# Patient Record
Sex: Male | Born: 1940 | Race: White | Hispanic: No | Marital: Married | Smoking: Never smoker
Health system: Southern US, Community
[De-identification: ages and names within clinical notes are randomized; demographics above are authoritative.]

## PROBLEM LIST (undated history)

## (undated) DIAGNOSIS — F0390 Unspecified dementia without behavioral disturbance: Secondary | ICD-10-CM

## (undated) DIAGNOSIS — I10 Essential (primary) hypertension: Secondary | ICD-10-CM

## (undated) DIAGNOSIS — M109 Gout, unspecified: Secondary | ICD-10-CM

## (undated) DIAGNOSIS — K263 Acute duodenal ulcer without hemorrhage or perforation: Secondary | ICD-10-CM

## (undated) DIAGNOSIS — N289 Disorder of kidney and ureter, unspecified: Secondary | ICD-10-CM

## (undated) DIAGNOSIS — N183 Chronic kidney disease, stage 3 unspecified: Secondary | ICD-10-CM

## (undated) DIAGNOSIS — K5730 Diverticulosis of large intestine without perforation or abscess without bleeding: Secondary | ICD-10-CM

## (undated) DIAGNOSIS — H269 Unspecified cataract: Secondary | ICD-10-CM

## (undated) DIAGNOSIS — E785 Hyperlipidemia, unspecified: Secondary | ICD-10-CM

## (undated) DIAGNOSIS — L98499 Non-pressure chronic ulcer of skin of other sites with unspecified severity: Secondary | ICD-10-CM

## (undated) HISTORY — DX: Gout, unspecified: M10.9

## (undated) HISTORY — DX: Acute duodenal ulcer without hemorrhage or perforation: K26.3

## (undated) HISTORY — PX: CHOLECYSTECTOMY: SHX55

## (undated) HISTORY — PX: JOINT REPLACEMENT: SHX530

---

## 2000-12-17 ENCOUNTER — Ambulatory Visit: Admit: 2000-12-17 | Disposition: A | Discharge status: Home / Self Care | Payer: Self-pay | Admitting: Gastroenterology

## 2006-05-17 ENCOUNTER — Emergency Department
Admission: EM | Admit: 2006-05-17 | Disposition: A | Discharge status: Home / Self Care | Payer: Self-pay | Source: Emergency Department | Admitting: Emergency Medicine

## 2006-05-18 LAB — ^CBC WITH DIFF MCKESSON
BASOPHILS %: 0.4 % (ref 0–2)
Baso(Absolute): 0
Eosinophils %: 1.5 % (ref 0–6)
Eosinophils Absolute: 0.1
Hematocrit: 49.4 % — ABNORMAL HIGH (ref 39.0–49.0)
Hemoglobin: 16.4 g/dL (ref 13.2–17.3)
Lymphocytes Absolute: 1.2
Lymphocytes Relative: 17.9 % — ABNORMAL LOW (ref 25–55)
MCH: 29.3 pg (ref 27.0–34.0)
MCHC: 33.2 % (ref 32.0–36.0)
MCV: 88.2 fL (ref 80–100)
Monocytes Absolute: 0.6
Monocytes Relative %: 8.5 % — ABNORMAL HIGH (ref 1–8)
Neutrophils Absolute: 4.7
Neutrophils Relative %: 71.7 % — ABNORMAL HIGH (ref 49–69)
Platelets: 238 10*3/uL (ref 150–400)
RBC: 5.6 /mm3 — ABNORMAL HIGH (ref 3.80–5.40)
RDW: 13.4 % (ref 11.0–14.0)
WBC: 6.5 10*3/uL (ref 4.8–10.8)

## 2006-05-18 LAB — COMPREHENSIVE METABOLIC PANEL
ALT: 20 U/L (ref 7–56)
AST (SGOT): 29 U/L (ref 5–40)
Albumin, Synovial: 4.5 g/dL (ref 3.9–5.0)
Alkaline Phosphatase: 75 U/L (ref 38–126)
BUN / Creatinine Ratio: 17 (ref 8–20)
BUN: 22 mg/dL — ABNORMAL HIGH (ref 6–20)
Bilirubin, Total: 0.8 mg/dL (ref 0.2–1.3)
CO2: 27 mmol/L (ref 21.0–31.0)
Calcium: 9.1 mg/dL (ref 8.4–10.2)
Chloride: 104 mmol/L (ref 101–111)
Creatinine: 1.33 mg/dL (ref 0.5–1.4)
EGFR: 58 mL/min/{1.73_m2}
EGFR: >60 mL/min/{1.73_m2}
Glucose: 100 mg/dL (ref 70–100)
Potassium: 4.1 mmol/L (ref 3.6–5.0)
Protein, Total: 7.7 g/dL (ref 6.3–8.2)
Sodium: 142 mmol/L (ref 135–145)

## 2006-05-18 LAB — URINALYSIS
Bilirubin, UA: NEGATIVE
Blood, UA: NEGATIVE
Glucose, UA: NEGATIVE
Ketones UA: NEGATIVE
Leukocyte Esterase, UA: NEGATIVE
Nitrate: NEGATIVE
Protein, UR: NEGATIVE
Specific Gravity, UR: 1.016 (ref 1.000–1.035)
Urobilinogen, UA: NORMAL
pH, Urine: 5.5 (ref 5.0–8.0)

## 2007-01-09 ENCOUNTER — Inpatient Hospital Stay
Admission: EM | Admit: 2007-01-09 | Disposition: A | Discharge status: Home / Self Care | Payer: Self-pay | Source: Emergency Department | Admitting: Internal Medicine

## 2007-01-09 DIAGNOSIS — K922 Gastrointestinal hemorrhage, unspecified: Secondary | ICD-10-CM

## 2007-01-09 HISTORY — DX: Gastrointestinal hemorrhage, unspecified: K92.2

## 2007-01-11 LAB — BASIC METABOLIC PANEL
BUN / Creatinine Ratio: 22 — ABNORMAL HIGH (ref 8–20)
BUN: 33 mg/dL — ABNORMAL HIGH (ref 6–20)
CO2: 25 mmol/L (ref 21.0–31.0)
Calcium: 8.7 mg/dL (ref 8.4–10.2)
Chloride: 108 mmol/L (ref 101–111)
Creatinine: 1.49 mg/dL — ABNORMAL HIGH (ref 0.5–1.4)
EGFR: 51 mL/min/{1.73_m2}
EGFR: >60 mL/min/{1.73_m2}
Glucose: 81 mg/dL (ref 70–100)
Potassium: 4.3 mmol/L (ref 3.6–5.0)
Sodium: 139 mmol/L (ref 135–145)

## 2007-01-11 LAB — ^CBC WITH DIFF MCKESSON
BASOPHILS %: 0.4 % (ref 0–2)
BASOPHILS %: 0.9 % (ref 0–2)
Baso(Absolute): 0
Baso(Absolute): 0.1
Eosinophils %: 0.3 % (ref 0–6)
Eosinophils %: 2.5 % (ref 0–6)
Eosinophils Absolute: 0
Eosinophils Absolute: 0.2
Hematocrit: 33.5 % — ABNORMAL LOW (ref 39.0–49.0)
Hematocrit: 26.9 % — ABNORMAL LOW (ref 39.0–49.0)
Hemoglobin: 11.2 g/dL — ABNORMAL LOW (ref 13.2–17.3)
Hemoglobin: 9.1 g/dL — ABNORMAL LOW (ref 13.2–17.3)
Lymphocytes Absolute: 1.8
Lymphocytes Absolute: 1.9
Lymphocytes Relative: 15.3 % — ABNORMAL LOW (ref 25–55)
Lymphocytes Relative: 23.5 % — ABNORMAL LOW (ref 25–55)
MCH: 29.6 pg (ref 27.0–34.0)
MCH: 29.8 pg (ref 27.0–34.0)
MCHC: 33.4 % (ref 32.0–36.0)
MCHC: 33.7 % (ref 32.0–36.0)
MCV: 88.5 fL (ref 80–100)
MCV: 88.3 fL (ref 80–100)
Monocytes Absolute: 1
Monocytes Absolute: 0.6
Monocytes Relative %: 8.4 % — ABNORMAL HIGH (ref 1–8)
Monocytes Relative %: 7.3 % (ref 1–8)
Neutrophils Absolute: 9.1
Neutrophils Absolute: 5.2
Neutrophils Relative %: 75.6 % — ABNORMAL HIGH (ref 49–69)
Neutrophils Relative %: 65.8 % (ref 49–69)
Platelets: 255 10*3/uL (ref 150–400)
Platelets: 182 10*3/uL (ref 150–400)
RBC: 3.78 /mm3 — ABNORMAL LOW (ref 3.80–5.40)
RBC: 3.05 /mm3 — ABNORMAL LOW (ref 3.80–5.40)
RDW: 13.2 % (ref 11.0–14.0)
RDW: 13.4 % (ref 11.0–14.0)
WBC: 12.1 10*3/uL — ABNORMAL HIGH (ref 4.8–10.8)
WBC: 7.9 10*3/uL (ref 4.8–10.8)

## 2007-01-11 LAB — URINALYSIS
Bilirubin, UA: NEGATIVE
Blood, UA: NEGATIVE
Glucose, UA: NEGATIVE
Leukocyte Esterase, UA: NEGATIVE
Nitrate: NEGATIVE
Specific Gravity, UR: 1.022 (ref 1.000–1.035)
Urobilinogen, UA: NORMAL
pH, Urine: 5.5 (ref 5.0–8.0)

## 2007-01-11 LAB — COMPREHENSIVE METABOLIC PANEL
ALT: 18 U/L (ref 7–56)
AST (SGOT): 30 U/L (ref 5–40)
Albumin, Synovial: 4 g/dL (ref 3.9–5.0)
Alkaline Phosphatase: 62 U/L (ref 38–126)
BUN / Creatinine Ratio: 27 — ABNORMAL HIGH (ref 8–20)
BUN: 47 mg/dL — ABNORMAL HIGH (ref 6–20)
Bilirubin, Total: 0.6 mg/dL (ref 0.2–1.3)
CO2: 25 mmol/L (ref 21.0–31.0)
Calcium: 10.7 mg/dL — ABNORMAL HIGH (ref 8.4–10.2)
Chloride: 104 mmol/L (ref 101–111)
Creatinine: 1.8 mg/dL — ABNORMAL HIGH (ref 0.5–1.4)
EGFR: 41 mL/min/{1.73_m2}
EGFR: 49 mL/min/{1.73_m2}
Glucose: 106 mg/dL — ABNORMAL HIGH (ref 70–100)
Potassium: 4.1 mmol/L (ref 3.6–5.0)
Protein, Total: 6.7 g/dL (ref 6.3–8.2)
Sodium: 140 mmol/L (ref 135–145)

## 2007-01-11 LAB — URINALYSIS WITH MICROSCOPIC

## 2007-01-11 LAB — PTT (PARTIAL THROMBOPLASTIN TIME)
PTT Ratio: 0.8 (ref 0.8–1.2)
PTT: 22.5 s (ref 21.6–34.0)

## 2007-01-11 LAB — PT (PROTHROMBIN TIME)
PT INR: 1.1
PT: 12.5 s (ref 10.6–12.8)

## 2007-01-11 LAB — ABO/RH: ABO Rh: A POS

## 2007-01-11 LAB — ^HH  (HEMOGLOBIN AND HEMATOCRIT) MCKESSON
Hematocrit: 28 % — ABNORMAL LOW (ref 39.0–49.0)
Hematocrit: 26.3 % — ABNORMAL LOW (ref 39.0–49.0)
Hemoglobin: 9.4 g/dL — ABNORMAL LOW (ref 13.2–17.3)
Hemoglobin: 8.8 g/dL — ABNORMAL LOW (ref 13.2–17.3)

## 2007-01-11 LAB — ANTIBODY SCREEN: AB Screen Gel: NEGATIVE

## 2007-03-09 ENCOUNTER — Ambulatory Visit
Admission: AD | Admit: 2007-03-09 | Disposition: A | Discharge status: Home / Self Care | Payer: Self-pay | Source: Ambulatory Visit | Admitting: Gastroenterology

## 2011-06-19 NOTE — H&P (Signed)
(  NAME)             Chad Massey, Chad Massey  (ADMIT DATE)       01/09/2007  (MR NUMBER)        [id number]  (ACCT NUMBER)      [id number]  (ROOM NUMBER)      [id number]  (PHYSICIAN)        Majel Homer, M.D.    [facility]  HISTORY AND PHYSICAL      HISTORY OF PRESENT ILLNESS  This is a 70 year old gentleman with a history of gout and hypertension who  was in his usual state of health.  Yesterday, he started having some joint  pain, took some aspirin followed by Indomethacin.  The patient had an  episode of blood per rectum associated with vomiting some blood, nausea,  that has subsided.  Today, he had an episode of black tarry stool with  dizziness.  No chest pain or shortness of breath.  No palpitations.  The  patient denies any similar episode in the past.  Denies any fever, chills.      PAST MEDICAL HISTORY  History of hypertension, gout.    PAST SURGICAL HISTORY  Colonoscopy for polyps.    ALLERGIES  No drug allergies.    MEDICATIONS  Atenolol 50.  Indomethacin and aspirin, which he started yesterday.    SOCIAL HISTORY  No smoking.  Social alcohol.    FAMILY HISTORY Reviewed.  Noncontributory.    PHYSICAL EXAMINATION  VITAL SIGNS:  Blood pressure 154/82, pulse 77, respirations 22, T-max 99.  GENERAL:  A well-built gentleman who looks well, in no acute distress.  HEENT:  Conjunctivae pink.  Sclerae anicteric.  NECK:  No JVD.  No thyromegaly.  LUNGS:  Clear bilaterally.  CARDIOVASCULAR:  S1, S2, regular.ABDOMEN:  Soft.  Nontender.  Bowel sounds  are present.  EXTREMITIES:  No clubbing, cyanosis, or edema.    LABORATORY DATA  Hemoglobin 11.2, platelets 255,000.  Creatinine 1.80, BUN 47.    ASSESSMENT AND PLAN  This is a 70 year old gentleman who presented with GI bleed secondary to  NSAID.  1.  GI bleed. The patient will undergo endoscopy.  Rule out gastric bleed.  Will start the patient on Protonix IV 8 mg/hr.  2.  Hypertension.  Will continue with the Atenolol at 50 mg daily.  Avoid  any NSAIDs.  3.  Mild  renal failure, most probably secondary to GI bleed.  Will hydrate  the patient.  Reevaluate in the morning.              _______________________________________        Majel Homer, M.D.  BE/dh  D:  01/09/2007  3:38 P  T:  01/09/2007  8:52 P  Job #:  [id number]  Doc #:  [id number]  cc:   Majel Homer, M.D.        Hilbert Bible, M.D.  Authenticated by Majel Homer, MD On 01/10/2007 07:15:22 AM

## 2011-06-19 NOTE — Consults (Signed)
(NAME)             Massey, Chad H  (ADMIT DATE)       01/09/2007  (CONSULT DATE)     01/09/2007  (MR NUMBER)        [id number]  [facility] NUMBER)      [id number]  (ROOM NUMBER)      [id number]  (PHYSICIAN)        Hilbert Bible, M.D.    [facility]  CONSULTATION        CHIEF COMPLAINT  Upper GI bleed.  Dark stools.    HISTORY OF PRESENT ILLNESS  Chad Massey is a pleasant, 70 year old Caucasian gentleman who complains of  a two-day history of coffee-ground emesis and dark stools associated with  some mild nausea but no chest pain.  He also had some mild shortness of  breath with exertion.  The patient almost called 911 yesterday.  He had  another episode of vomiting of darkish material today.  He had solid stools  which, again, were quite dark.  He has been using nonsteroidal  antiinflammatory agents which he took in the PM yesterday as well as aspirin.    He came to the emergency room for further evaluation.  The patient denies any   previous history of hematemesis, peptic ulcer disease. The patient says he has   lost eight pounds over the past two days thought secondary to decreased p.o.   intake.  He did have a colonoscopy approximately five years ago with Dr.   Marlane Hatcher and found to have polyps but no diverticula.    PAST MEDICAL HISTORY  1.  Hypertension.  2.  Gout.    ALLERGIES  Gatorade.    MEDICATIONS Atenolol 50 mg q. day.    SOCIAL HISTORY  The patient has two children, healthy.   He works for [employer] [place].  Alcohol:  1-2 drinks a week.  Tobacco:  none.      REVIEW OF SYSTEMS  Positive for an eight-pound weight loss secondary to decreased p.o. intake  from just two days ago.  Positive for gout.    PHYSICAL EXAMINATION  GENERAL:  A well-developed, well-nourished, Caucasian gentleman in no acute  distress.  VITAL SIGNS:  Stable.  Blood pressure ___/70, heart rate 71, respirations  18.  HEENT:  No scleral icterus.  Normal mucous membranes.  NECK:  Soft.  Supple.  No  lymphadenopathy.  No carotid bruits.  CARDIOVASCULAR:  S1, S2.  Regular rate and rhythm.  No murmurs, rubs or  gallops.  LUNGS:  Clear to auscultation.  Normal inspiratory and expiratory effort.  ABDOMEN:  Soft.  Hyperactive bowel sounds.  Nontender.  No voluntary  guarding.  Slightly obese.  EXTREMITIES:  No clubbing, cyanosis, or edema.RECTAL EXAM:  Per emergency  room, hem-positive stools, dark stools.  No bright red blood.    LABORATORY DATA  WBC 12.1, hemoglobin 11.2, hematocrit 33.5, mean corpuscular volume 88.5,  platelet count 255,000.  Sodium 140,  chloride 104, bicarb 26, BUN 47,   creatinine 1.8, glucose 103.  Calcium 10.7.  SGOT 30, SGPT 18, alkaline   phosphatase 82, bilirubin 0.6.    IMPRESSION  Chad Massey is a pleasant 70 year old gentleman with a history of  hypertension and gout who was evaluated for upper GI bleed.  Differential  diagnoses include:  peptic ulcer, gastritis, duodenal ulcer, esophageal  ulcer, gastric lymphoma or gastric adenocarcinoma.  Other possibilities  include a Dielafoy's lesion.  Recommend  the patient undergo an upper endoscopy.    I see he's been NPO and his vital signs appear to be stable.  Hopefully, we    can get this done as soon as possible.  His H Hs are stable, but he's slightly   dehydrated. Anticipate his hemoglobin drop will be approximately to 9.  He   denies a history of cardiac disease.  Further decision will be made depending   on clinical findings at the time of upper endoscopy.  I would place the patient   on a proton pump inhibitor for now.  Also, I will test for H. pylori.    RECOMMENDATIONS  1.  Upper endoscopy now as soon as possible, as the patient is stable.  Will discuss with anesthesia.  2.  Proton pump inhibitor.  Protonix 40 mg IV twice daily,  _or drip at 8 mg/hr.  3.  NPO for now.  4.  Will watch for H. pylori.  5.  Follow H H.  If less than 9, consider transfusion.    Thank you for allowing me to participate in the care of this  interesting  patient.              _______________________________________        Hilbert Bible, M.D.  AM/dh  D:  01/09/2007  3:13 P  T:  01/09/2007  7:58 P  Job #:  [id number]  Doc #:  [id number]  cc:   Luciana Axe, M.D.        Hilbert Bible, M.D.        Lorri Frederick, M.D.  Authenticated and Edited by ANN MA, MD On 01/20/07 4:49:19 PM

## 2011-06-19 NOTE — Op Note (Signed)
(  NAME)             Chad Massey, Chad Massey  (ADMIT DATE)       01/09/2007  (SURGERY DATE)     01/09/2007  (MR NUMBER)        [id number]  (ACCT NUMBER)      [id number]  (ROOM NUMBER)      ERL  (SURGEON)          Hilbert Bible, M.D.    [facility]  REPORT OF OPERATION      PREOPERATIVE DIAGNOSIS  Upper gastrointestinal bleed.    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  Duodenitis.  Gastritis.  No active ulcer.  Duodenal  ulcer with no visible vessel seen.  A pH of 4.5.    OPERATION  Upper endoscopy with biopsy.    REFERRING PHYSICIAN  Dr. Lorri Frederick.    MEDICATIONS GIVENVersed 2.  Propofol 100.  Lidocaine 90.    ANESTHESIOLOGIST  Dr. Asencion Gowda.    PROCEDURE  After the risks, benefits, and alternatives were discussed with the  patient, he was placed in the left lateral decubitus position and  intravenous sedation was given.  The Olympus GIF-160 front-viewing  endoscope was passed directly over the tongue and through the esophageal  sphincter.  There was a 2 cm hiatal hernia which was fixed.  The esophageal  junction appeared to be slightly irregular, but no esophagitis was seen.  The scope was passed into the stomach and the fluid was aspirated.  The pH  was 4.5.  Retroflexion was performed and this confirmed the presence of the  hiatal hernia.  The scope was passed to the antrum.  There was mild  antritis.  The scope was passed through the pylorus.  There was a shallow  duodenal ulcer with heaped-up margins but no visible vessel.  There were  small erosions of the duodenum, as well.  The second portion was  unremarkable.  The scope was carefully withdrawn.  Again, there were  hypertrophic duodenal folds and duodenal erosions and ulcers but no visible  vessel.  There was no active bleeding seen.  The scope was withdrawn.  Biopsies were taken of the antrum for CLO as well as for pathology.  The  scope was withdrawn.  The patient tolerated the procedure well.    IMPRESSION  1.  Gastritis.  2.  Duodenal  ulcer, shallow.  No visible vessel.  3.  Small duodenal erosions, probably secondary to NSAID use.  4.  Hiatal hernia.    RECOMMENDATIONS  1.  No active bleeding seen.  Would continue PPI but may be discharged home  tomorrow if hemoglobin is greater than 9.  2.  Avoid NSAIDs and aspirin.  3.  Return to office for review and discussion.  Consider repeat upper  endoscopy to document healing of duodenal ulcer.      _______________________________________  Hilbert Bible, M.D.  AM/dem  D:  01/09/2007  3:54 P  T:  01/09/2007  4:14 P  Job #:  [id number]  Doc #:  [id number]  cc:   Luciana Axe, M.D.        Hilbert Bible, M.D.  Authenticated and Edited by ANN MA, MD On 01/20/07 4:48:18 PM

## 2011-06-19 NOTE — Progress Notes (Signed)
ADDENDUM TO RECORD    RE:  SREEKAR, BROYHILL  MR#: [id number]  ACCT#:  [id number]    The relationship between the gastrointestinal bleed and duodenal ulcer was  that they were very related.  The duodenal ulcer caused the GI bleed.        Majel Homer, M.D.  BE/dem  D:  01/31/2007 10:43 P  T:  02/02/2007 10:13 A  Job #:  [id number]  Doc #:  [id number]  cc:   Majel Homer, M.D.  Authenticated by Majel Homer, MD On 02/17/2007 07:08:44 AM

## 2011-06-19 NOTE — Progress Notes (Signed)
ADDENDUM TO RECORD    RE:  Chad, Massey  MR#: [id number]  ACCT#:  [id number]    By history, the patient had taken some NSAIDs prior to the occurrence of  the symptoms.  There is a strong relationship in this case with the use of  NSAIDs.  The H. pylori screen was negative.        Majel Homer, M.D.  BE/dem  D:  01/20/2007  6:59 A  T:  01/20/2007 10:39 A  Job #:  [id number]  Doc #:  [id number]  cc:   Majel Homer, M.D.  Authenticated by Majel Homer, MD On 01/23/2007 10:07:14 AM

## 2012-06-08 LAB — ECG 12-LEAD
Atrial Rate: 61 {beats}/min
P Axis: 14 degrees
P-R Interval: 190 ms
Q-T Interval: 424 ms
QRS Duration: 80 ms
QTC Calculation (Bezet): 426 ms
R Axis: 5 degrees
T Axis: 44 degrees
Ventricular Rate: 61 {beats}/min

## 2014-01-04 ENCOUNTER — Telehealth: Payer: BLUE CROSS/BLUE SHIELD

## 2014-01-04 NOTE — Pre-Procedure Instructions (Signed)
PATIENT WAS INSTRUCTED ON THE NEED FOR NPO AFTER MIDNIGHT  AND WAS INSTRUCTED TO TAKE ALL HIS BP MEDS IN THE AM OF DAY OF SURGERY  WITH LITTLE SIP OF WATER.INSTRUCTED HIM ON PAIN MANAGEMENTIN THE POSTOP.

## 2014-01-04 NOTE — Pre-Procedure Instructions (Signed)
PATIENT ALREADY SCHEDULED FOR PRE OP EKG AND LAB WORKS  ON 01/05/14 AT HIS PCP OFFICE  AS STATED BY THE PATIENT.

## 2014-01-06 ENCOUNTER — Encounter: Payer: Self-pay | Admitting: Anesthesiology

## 2014-01-06 NOTE — Anesthesia Preprocedure Evaluation (Addendum)
Anesthesia Evaluation    AIRWAY    Mallampati: II    TM distance: >3 FB  Neck ROM: full  Mouth Opening:full   CARDIOVASCULAR    cardiovascular exam normal       DENTAL    No notable dental hx     PULMONARY    pulmonary exam normal     OTHER FINDINGS              PSS Anesthesia Comments: Awaiting requested docs (4/23 21:49)        Anesthesia Plan    ASA 2     general               (53M HTN on 3 meds, Gout)      intravenous induction   Detailed anesthesia plan: general endotracheal            informed consent obtained    Plan discussed with CRNA.

## 2014-01-10 ENCOUNTER — Encounter: Payer: Self-pay | Admitting: Anesthesiology

## 2014-01-10 ENCOUNTER — Ambulatory Visit
Admission: RE | Admit: 2014-01-10 | Discharge: 2014-01-10 | Disposition: A | Discharge status: Home / Self Care | Payer: BLUE CROSS/BLUE SHIELD | Source: Ambulatory Visit | Attending: Surgery | Admitting: Surgery

## 2014-01-10 ENCOUNTER — Ambulatory Visit: Payer: BLUE CROSS/BLUE SHIELD | Admitting: Anesthesiology

## 2014-01-10 ENCOUNTER — Encounter
Admission: RE | Disposition: A | Discharge status: Home / Self Care | Payer: Self-pay | Source: Ambulatory Visit | Attending: Surgery

## 2014-01-10 ENCOUNTER — Ambulatory Visit: Payer: Self-pay

## 2014-01-10 ENCOUNTER — Encounter: Payer: Self-pay | Admitting: Surgery

## 2014-01-10 ENCOUNTER — Ambulatory Visit: Payer: BLUE CROSS/BLUE SHIELD | Admitting: Surgery

## 2014-01-10 DIAGNOSIS — K811 Chronic cholecystitis: Secondary | ICD-10-CM

## 2014-01-10 DIAGNOSIS — I129 Hypertensive chronic kidney disease with stage 1 through stage 4 chronic kidney disease, or unspecified chronic kidney disease: Secondary | ICD-10-CM | POA: Insufficient documentation

## 2014-01-10 DIAGNOSIS — E7800 Pure hypercholesterolemia, unspecified: Secondary | ICD-10-CM | POA: Insufficient documentation

## 2014-01-10 DIAGNOSIS — N189 Chronic kidney disease, unspecified: Secondary | ICD-10-CM | POA: Insufficient documentation

## 2014-01-10 HISTORY — DX: Essential (primary) hypertension: I10

## 2014-01-10 HISTORY — DX: Non-pressure chronic ulcer of skin of other sites with unspecified severity: L98.499

## 2014-01-10 HISTORY — DX: Unspecified cataract: H26.9

## 2014-01-10 SURGERY — ROBOT ASSISTED, LAPAROSCOPIC, CHOLECYSTECTOMY, SINGLE PORT - USE 297
Anesthesia: Anesthesia General | Wound class: Clean Contaminated

## 2014-01-10 MED ORDER — EPHEDRINE SULFATE 50 MG/ML IJ SOLN
INTRAMUSCULAR | Status: DC | PRN
Start: 2014-01-10 — End: 2014-01-10
  Administered 2014-01-10: 10 mg via INTRAVENOUS

## 2014-01-10 MED ORDER — ROCURONIUM BROMIDE 50 MG/5ML IV SOLN
INTRAVENOUS | Status: AC
Start: 2014-01-10 — End: ?
  Filled 2014-01-10: qty 5

## 2014-01-10 MED ORDER — PROMETHAZINE HCL 25 MG/ML IJ SOLN
6.2500 mg | Freq: Once | INTRAMUSCULAR | Status: DC | PRN
Start: 2014-01-10 — End: 2014-01-10

## 2014-01-10 MED ORDER — NEOSTIGMINE METHYLSULFATE 1 MG/ML IJ SOLN
INTRAMUSCULAR | Status: DC | PRN
Start: 2014-01-10 — End: 2014-01-10
  Administered 2014-01-10: 5 mg via INTRAVENOUS

## 2014-01-10 MED ORDER — DEXAMETHASONE SODIUM PHOSPHATE 10 MG/ML IJ SOLN
INTRAMUSCULAR | Status: AC
Start: 2014-01-10 — End: ?
  Filled 2014-01-10: qty 1

## 2014-01-10 MED ORDER — HYDROCODONE-ACETAMINOPHEN 5-325 MG PO TABS
1.0000 | ORAL_TABLET | ORAL | Status: AC | PRN
Start: 2014-01-10 — End: 2014-01-20

## 2014-01-10 MED ORDER — CEFAZOLIN SODIUM 1 G IJ SOLR
2.0000 g | Freq: Once | INTRAMUSCULAR | Status: AC
Start: 2014-01-10 — End: 2014-01-10
  Administered 2014-01-10: 2 g via INTRAVENOUS

## 2014-01-10 MED ORDER — HYDROMORPHONE HCL PF 1 MG/ML IJ SOLN
0.5000 mg | INTRAMUSCULAR | Status: DC | PRN
Start: 2014-01-10 — End: 2014-01-10

## 2014-01-10 MED ORDER — BUPIVACAINE HCL (PF) 0.25 % IJ SOLN
INTRAMUSCULAR | Status: DC | PRN
Start: 2014-01-10 — End: 2014-01-10
  Administered 2014-01-10: 30 mg

## 2014-01-10 MED ORDER — NEOSTIGMINE METHYLSULFATE 1 MG/ML IJ SOLN
INTRAMUSCULAR | Status: AC
Start: 2014-01-10 — End: ?
  Filled 2014-01-10: qty 10

## 2014-01-10 MED ORDER — LIDOCAINE HCL 2 % IJ SOLN
INTRAMUSCULAR | Status: DC | PRN
Start: 2014-01-10 — End: 2014-01-10
  Administered 2014-01-10: 100 mg

## 2014-01-10 MED ORDER — BACITRACIN 500 UNIT/GM EX OINT
TOPICAL_OINTMENT | CUTANEOUS | Status: AC
Start: 2014-01-10 — End: ?
  Filled 2014-01-10: qty 30

## 2014-01-10 MED ORDER — HYDROCODONE-ACETAMINOPHEN 5-325 MG PO TABS
ORAL_TABLET | ORAL | Status: AC
Start: 2014-01-10 — End: 2014-01-10
  Administered 2014-01-10: 1 via ORAL
  Filled 2014-01-10: qty 1

## 2014-01-10 MED ORDER — DEXAMETHASONE SODIUM PHOSPHATE 4 MG/ML IJ SOLN (WRAP)
INTRAMUSCULAR | Status: DC | PRN
Start: 2014-01-10 — End: 2014-01-10
  Administered 2014-01-10: 10 mg via INTRAVENOUS

## 2014-01-10 MED ORDER — OXYCODONE-ACETAMINOPHEN 5-325 MG PO TABS
1.0000 | ORAL_TABLET | Freq: Once | ORAL | Status: DC | PRN
Start: 2014-01-10 — End: 2014-01-10

## 2014-01-10 MED ORDER — BUPIVACAINE HCL (PF) 0.25 % IJ SOLN
INTRAMUSCULAR | Status: AC
Start: 2014-01-10 — End: ?
  Filled 2014-01-10: qty 30

## 2014-01-10 MED ORDER — ONDANSETRON HCL 4 MG/2ML IJ SOLN
INTRAMUSCULAR | Status: AC
Start: 2014-01-10 — End: ?
  Filled 2014-01-10: qty 2

## 2014-01-10 MED ORDER — FENTANYL CITRATE 0.05 MG/ML IJ SOLN
INTRAMUSCULAR | Status: AC
Start: 2014-01-10 — End: ?
  Filled 2014-01-10: qty 5

## 2014-01-10 MED ORDER — PROPOFOL INFUSION 10 MG/ML
INTRAVENOUS | Status: DC | PRN
Start: 2014-01-10 — End: 2014-01-10
  Administered 2014-01-10: 150 mg via INTRAVENOUS

## 2014-01-10 MED ORDER — MEPERIDINE HCL 25 MG/ML IJ SOLN
25.0000 mg | INTRAMUSCULAR | Status: DC | PRN
Start: 2014-01-10 — End: 2014-01-10

## 2014-01-10 MED ORDER — CEFAZOLIN SODIUM 1 G IJ SOLR
INTRAMUSCULAR | Status: DC
Start: 2014-01-10 — End: 2014-01-10
  Filled 2014-01-10: qty 2000

## 2014-01-10 MED ORDER — MIDAZOLAM HCL 2 MG/2ML IJ SOLN
INTRAMUSCULAR | Status: AC
Start: 2014-01-10 — End: ?
  Filled 2014-01-10: qty 2

## 2014-01-10 MED ORDER — ONDANSETRON HCL 4 MG/2ML IJ SOLN
INTRAMUSCULAR | Status: DC | PRN
Start: 2014-01-10 — End: 2014-01-10
  Administered 2014-01-10: 4 mg via INTRAVENOUS

## 2014-01-10 MED ORDER — MIDAZOLAM HCL 2 MG/2ML IJ SOLN
INTRAMUSCULAR | Status: DC | PRN
Start: 2014-01-10 — End: 2014-01-10
  Administered 2014-01-10: 2 mg via INTRAVENOUS

## 2014-01-10 MED ORDER — HYDROCODONE-ACETAMINOPHEN 5-325 MG PO TABS
1.0000 | ORAL_TABLET | Freq: Once | ORAL | Status: AC
Start: 2014-01-10 — End: 2014-01-10

## 2014-01-10 MED ORDER — BACITRACIN 500 UNIT/GM EX OINT
TOPICAL_OINTMENT | CUTANEOUS | Status: AC
Start: 2014-01-10 — End: ?
  Filled 2014-01-10: qty 0.9

## 2014-01-10 MED ORDER — GLYCOPYRROLATE 0.2 MG/ML IJ SOLN
INTRAMUSCULAR | Status: DC | PRN
Start: 2014-01-10 — End: 2014-01-10
  Administered 2014-01-10: .8 mg via INTRAVENOUS

## 2014-01-10 MED ORDER — ROCURONIUM BROMIDE 50 MG/5ML IV SOLN
INTRAVENOUS | Status: DC | PRN
Start: 2014-01-10 — End: 2014-01-10
  Administered 2014-01-10: 40 mg via INTRAVENOUS
  Administered 2014-01-10: 10 mg via INTRAVENOUS

## 2014-01-10 MED ORDER — DIPHENHYDRAMINE HCL 50 MG/ML IJ SOLN
6.2500 mg | Freq: Four times a day (QID) | INTRAMUSCULAR | Status: DC | PRN
Start: 2014-01-10 — End: 2014-01-10

## 2014-01-10 MED ORDER — LACTATED RINGERS IV SOLN
1000.0000 mL | INTRAVENOUS | Status: DC
Start: 2014-01-10 — End: 2014-01-10
  Administered 2014-01-10: 1000 mL via INTRAVENOUS

## 2014-01-10 MED ORDER — PROPOFOL 10 MG/ML IV EMUL
INTRAVENOUS | Status: AC
Start: 2014-01-10 — End: ?
  Filled 2014-01-10: qty 20

## 2014-01-10 MED ORDER — EPHEDRINE SULFATE 50 MG/ML IJ SOLN
INTRAMUSCULAR | Status: AC
Start: 2014-01-10 — End: ?
  Filled 2014-01-10: qty 1

## 2014-01-10 MED ORDER — LIDOCAINE HCL (PF) 2 % IJ SOLN
INTRAMUSCULAR | Status: AC
Start: 2014-01-10 — End: ?
  Filled 2014-01-10: qty 5

## 2014-01-10 MED ORDER — LIDOCAINE 1% BUFFERED - CNR/OUTSOURCED
0.3000 mL | Freq: Once | INTRAMUSCULAR | Status: AC
Start: 2014-01-10 — End: 2014-01-10
  Administered 2014-01-10: 0.3 mL via INTRADERMAL

## 2014-01-10 MED ORDER — FENTANYL CITRATE 0.05 MG/ML IJ SOLN
INTRAMUSCULAR | Status: DC | PRN
Start: 2014-01-10 — End: 2014-01-10
  Administered 2014-01-10 (×2): 50 ug via INTRAVENOUS
  Administered 2014-01-10: 100 ug via INTRAVENOUS

## 2014-01-10 MED ORDER — GLYCOPYRROLATE 1 MG/5ML IJ SOLN
INTRAMUSCULAR | Status: AC
Start: 2014-01-10 — End: ?
  Filled 2014-01-10: qty 5

## 2014-01-10 SURGICAL SUPPLY — 36 items
BLADE SRGCLPR LF STRL PVT ADJ HD DISP (Blade) ×1
BLADE SURGICAL CLIPPER PIVOT ADJUSTABLE (Blade) ×1
BLADE SURGICAL CLIPPER PIVOT ADJUSTABLE HEAD 9661 PURPLE (Blade) IMPLANT
BLANKET UPPER BODY HUGGER (Procedure Accessories) ×2 IMPLANT
CABLE HIGH FREQUENCY MONOPOLAR (Cautery) ×2 IMPLANT
CLIP INTERNAL MEDIUM LARGE LIGATE (Clips) ×1
CLIP INTERNAL MEDIUM LARGE LIGATE NONABSORBABLE CARTRIDGE WECK (Clips) ×1 IMPLANT
CLIP INTNL PLMR MED LG WECK HEM-O-LOK LF (Clips) ×1
DRAPE 3-ARM (Drape) ×2
DRAPE ACCESSORY 3 ARM DA VINCI 20X13X10.5IN 420290 (Drape) ×1 IMPLANT
DRAPE SCOPEPILLOW 44 X 66 (Drape) ×2 IMPLANT
GLOVE SURG SUPER-SENSER SZ6 (Glove) ×4 IMPLANT
GOWN SURG MICROCOOL STRL LG (Gown) ×2 IMPLANT
IRRIGATOR SUCTN PUMP/HANDPIECE (Suction) ×2 IMPLANT
JELLY LUB SRGLB LF STRL 3GM (Procedure Accessories) ×1 IMPLANT
JELLY SRGLB LUB 3GM STRL LF (Procedure Accessories) ×1
KIT GENERAL ROBOTIC GENERIC (Kits) ×2 IMPLANT
PAD POSITIONER CRADLE HEAD PINK DEVON 4X8X9IN FOAM ADULT 31143129 (Positioning Supplies) ×1 IMPLANT
PORT HAND ACCESS SINGLE-SITE ID8.5 MM (Instrument) ×1 IMPLANT
PORT HND ACC SGL-ST 8.5MM LF STRL DISP (Instrument) ×2
POSITIONER HEAD FOAM ADULT (Positioning Supplies) ×2
POUCH SPEC RTRVL PU E-CTCH GLD 10MM 34.5 (Laparoscopy Supplies)
POUCH SPECIMEN RETRIEVAL L34.5 CM (Laparoscopy Supplies)
POUCH SPECIMEN RETRIEVAL L34.5 CM ERGONOMIC HANDLE LONG CYLINDRICAL (Laparoscopy Supplies) IMPLANT
SEAL CANNULA 8.5-13MM (Procedure Accessories) ×2 IMPLANT
SPONGE CHLRPRP TINT 26ML (Applicator) ×2 IMPLANT
SPONGE GZE STR 2'S 8PLY 2X2 (Dressing) ×2 IMPLANT
SPONGE LAP 18X18IN PREWASH WHT (Sponge) ×1
SPONGE LAPAROTOMY L18 IN X W18 IN (Sponge) ×1
SPONGE LAPAROTOMY L18 IN X W18 IN PREWASH WHITE (Sponge) ×1 IMPLANT
STERILE WATER 2000ML (Solution) ×4 IMPLANT
SUTURE ABS 4-0 PC5 VCL MTPS 18IN BRD (Suture) ×1
SUTURE COATED VICRYL 4-0 PC-5 L18 IN (Suture) ×1
SUTURE COATED VICRYL 4-0 PC-5 L18 IN BRAID COATED UNDYED ABSORBABLE (Suture) ×1 IMPLANT
SUTURE VICRYL 0 UR6 27IN (Suture) ×4 IMPLANT
SUTURE VICRYL 3-0 CT2 27IN (Suture) ×2 IMPLANT

## 2014-01-10 NOTE — Discharge Instructions (Signed)
After Gallbladder Surgery  You can usually go home the same day as your surgery. In some cases, you may need to stay overnight. Once you're at home, be sure to follow all your doctor's instructions.  In the Hospital  Bandages will cover your incisions and you may have special boots on your legs to prevent blood clots.To aid recovery, you'll be asked to get up and move as soon as possible. You may also be asked to use a device that helps keep your lungs clear.  At Home  You can get back to your normal routine as soon as you feel able. To speed healing:   Take any prescribed pain medications as directed.   Follow your doctor's instructions about bathing and caring for your incisions.   Walk and move around as often as possible.   Ask your doctor about driving and going back to work. This is often about5-10 days after surgery.  Eating Normally Again  Removing the gallbladder doesn't mean you have to be on a special diet. But you may want to start with light meals. It can also take a few weeks for your digestion to adjust. You may have indigestion, loose stools, or diarrhea. This is normal and should go away in time.  Following Up  Keep follow-up appointments during your recovery. These allow your doctor to check your progress and answer any questions. Be sure to mention if you have any new symptoms. Also mention if you have diarrhea that doesn't go away.    Call your doctor if you have any of the following:   Fever over101For chills   Increasing pain, redness, or drainage at an incision site   Vomiting or nausea that lasts more than12 hours   Prolonged diarrhea      2000-2014 [employer], [address], [place] [address]. All rights reserved. This information is not intended as a substitute for professional medical care. Always follow your healthcare professional's instructions.

## 2014-01-10 NOTE — Interval H&P Note (Signed)
H&P up to date--pt seen and examined--no changes

## 2014-01-10 NOTE — Anesthesia Postprocedure Evaluation (Signed)
The patient is awake or easily arousable.      The patients respirations, and cardiovascular status have been evaluated and deemed stable post op.     Post op nausea, vomiting and pain have been treated and controlled as effectively as possible without compromising the patients respiratory and cardiovascular status.    Please refer to Post Op PACU Documentation for confirmation of attainment of normothermia and adequate hydration status.    There were no obvious anesthetic related complications.    The patient has recovered adequately to be transfered to the next phase of care.

## 2014-01-10 NOTE — PACU (Signed)
1311 Pt states that his pain is tolerable (rated 3/10).

## 2014-01-10 NOTE — Transfer of Care (Signed)
Anesthesia Transfer of Care Note    Patient: Chad Massey    Procedures performed: Procedure(s) with comments:  ROBOT ASSISTED, LAPAROSCOPIC, CHOLECYSTECTOMY, SINGLE PORT - ROBOTIC, CHOLECYSTECTOMY, SINGLE PORT     Anesthesia type: General ETT    Patient location:Phase I PACU    Last vitals:   Filed Vitals:    01/10/14 1302   BP: 163/77   Pulse: 75   Temp: 36.9 C (98.4 F)   Resp: 15   SpO2: 100%       Post pain: Patient not complaining of pain, continue current therapy      Mental Status:awake    Respiratory Function: tolerating nasal cannula    Cardiovascular: stable    Nausea/Vomiting: patient not complaining of nausea or vomiting    Hydration Status: adequate    Post assessment: no apparent anesthetic complications  Report to [employer]

## 2014-01-10 NOTE — PACU (Signed)
PACU Discharge Note:    Patient demonstrates:  [x] Ability to void prior to discharge  [x] Ability to tolerate PO intake   [x] Ambulation to the bathroom  [x] Ambulation from stretcher to wheelchair  [] Ambulation in hallway  [x] Lack of nausea or vomiting prior to discharge  [x] Adequate pain control    Patient has received:  [x] Written discharge instructions specific to procedure prior to discharge  [x] Education on pain medication regimen once home.     Chad Massey  01/10/2014 3:09 PM

## 2014-01-10 NOTE — Brief Op Note (Signed)
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 Reginia Naas     Specimen Information    Specimen Testing Required Routine Pathology     Specimen ID  A     Specimen Description gallbladder and contents         Findings:   See pathology    Complications:   No immediate.      Signed by: Jerel Shepherd, MD                                                                           [facility]  01/10/2014  12:59 PM

## 2014-01-11 LAB — LAB USE ONLY - HISTORICAL SURGICAL PATHOLOGY

## 2014-01-13 NOTE — Op Note (Signed)
Procedure Date: 01/10/2014     Patient Type: A     SURGEON: Jeanann Lewandowsky MD  ASSISTANT:       PREOPERATIVE DIAGNOSIS:  Cholecystitis.     POSTOPERATIVE DIAGNOSIS:  Cholecystitis.     TITLE OF PROCEDURE:  Robotic single-site cholecystectomy.     ANESTHESIA:  General endotracheal.     ESTIMATED BLOOD LOSS:  Less than 20 mL.     CLINICAL HISTORY:  Chad Massey is a 73 year old male presenting with a  several-week history of very focused right upper quadrant pain associated  with rare reflux and an episode of loose stool.  He has taken Protonix  without improvement.  A recent sonogram has demonstrated a gallbladder  polyp versus sludge.  After a detailed discussion regarding gallbladder  disease and management options, the patient was eager to proceed with  cholecystectomy.  We reviewed open laparoscopic and robotic options,  discussed benefits, alternatives and potential risks of bleeding,  infection, bowel or bladder injury, need for an open or subsequent  procedure.  He understood and wished to proceed.     DESCRIPTION OF PROCEDURE:  The patient was taken to the operating suite, placed in the supine  position, and general endotracheal anesthesia was induced.  The abdomen was  prepped and draped in the usual sterile fashion.  An umbilical incision was  created measuring 2.5 cm.  Dissection was carried through subcutaneous  tissues utilizing the cautery, and the fascia was opened at the midline.   The peritoneum was carefully opened and the single-site port introduced.   The abdomen was insufflated to 15 mmHg and the patient positioned in  reverse Trendelenburg with a roll.  The camera port was introduced as was  the camera.  Visualized surfaces of the liver were normal.  No unusual  findings were encountered.  The da Vinci surgical robot was advanced to the  operative field and docked.  I proceeded to the console, where gallbladder  was retracted.  Filmy pericholecystic adhesions were easily taken down and  the  gallbladder fully exposed.  The cystic duct and artery were isolated as  they entered the gallbladder and clips placed.  Each structure was divided.   The remaining dissection of the gallbladder from the liver was  accomplished with the hook electrocautery.  The gallbladder bed was  inspected and found to be hemostatic.  The abdomen was irrigated.  Clips  were noted to be hemostatic with no evidence of bleeding or bile leakage.   Excess irrigant was suctioned.  I returned to the operative field, where  the gallbladder and single-site port were removed after the robot was  undocked.  Local anesthetic of 0.25% Marcaine was infiltrated into the  wound.  The fascia was approximated with a series of figure-of-eight 0  Vicryl sutures.  Subcutaneous tissues were irrigated.  Umbilical pexy was  performed with 3-0 Vicryl and, finally, subcuticular 4-0 Vicryl  approximated the skin.  Bacitracin ointment and 2 x 2 and Tegaderm dressing  were applied.  The patient tolerated the procedure well, was extubated in  the operating room and taken to recovery room in stable condition.           D:  01/12/2014 17:44 PM by Dr. Jeanann Lewandowsky, MD ([id number])  T:  01/13/2014 06:07 AM by       Everlean Cherry: [id number]) (Doc ID: [id number])

## 2014-02-05 ENCOUNTER — Emergency Department: Payer: Medicare Other

## 2014-02-05 ENCOUNTER — Inpatient Hospital Stay: Payer: BLUE CROSS/BLUE SHIELD | Admitting: Internal Medicine

## 2014-02-05 ENCOUNTER — Inpatient Hospital Stay
Admission: EM | Admit: 2014-02-05 | Discharge: 2014-02-08 | DRG: 384 | Disposition: A | Discharge status: Home / Self Care | Payer: Medicare Other | Attending: Internal Medicine | Admitting: Internal Medicine

## 2014-02-05 DIAGNOSIS — N179 Acute kidney failure, unspecified: Secondary | ICD-10-CM | POA: Insufficient documentation

## 2014-02-05 DIAGNOSIS — R4181 Age-related cognitive decline: Secondary | ICD-10-CM | POA: Diagnosis present

## 2014-02-05 DIAGNOSIS — E872 Acidosis, unspecified: Secondary | ICD-10-CM | POA: Diagnosis not present

## 2014-02-05 DIAGNOSIS — I10 Essential (primary) hypertension: Secondary | ICD-10-CM | POA: Diagnosis present

## 2014-02-05 DIAGNOSIS — N140 Analgesic nephropathy: Secondary | ICD-10-CM

## 2014-02-05 DIAGNOSIS — K449 Diaphragmatic hernia without obstruction or gangrene: Secondary | ICD-10-CM | POA: Diagnosis present

## 2014-02-05 DIAGNOSIS — K269 Duodenal ulcer, unspecified as acute or chronic, without hemorrhage or perforation: Secondary | ICD-10-CM | POA: Diagnosis present

## 2014-02-05 DIAGNOSIS — N058 Unspecified nephritic syndrome with other morphologic changes: Secondary | ICD-10-CM | POA: Diagnosis not present

## 2014-02-05 DIAGNOSIS — K5730 Diverticulosis of large intestine without perforation or abscess without bleeding: Secondary | ICD-10-CM | POA: Diagnosis present

## 2014-02-05 DIAGNOSIS — R700 Elevated erythrocyte sedimentation rate: Secondary | ICD-10-CM | POA: Diagnosis present

## 2014-02-05 DIAGNOSIS — M109 Gout, unspecified: Secondary | ICD-10-CM | POA: Diagnosis present

## 2014-02-05 DIAGNOSIS — R109 Unspecified abdominal pain: Secondary | ICD-10-CM | POA: Diagnosis present

## 2014-02-05 DIAGNOSIS — K2270 Barrett's esophagus without dysplasia: Secondary | ICD-10-CM | POA: Diagnosis present

## 2014-02-05 DIAGNOSIS — E860 Dehydration: Secondary | ICD-10-CM | POA: Diagnosis present

## 2014-02-05 DIAGNOSIS — K259 Gastric ulcer, unspecified as acute or chronic, without hemorrhage or perforation: Principal | ICD-10-CM | POA: Diagnosis present

## 2014-02-05 DIAGNOSIS — D509 Iron deficiency anemia, unspecified: Secondary | ICD-10-CM | POA: Diagnosis present

## 2014-02-05 DIAGNOSIS — E8729 Other acidosis: Secondary | ICD-10-CM | POA: Diagnosis present

## 2014-02-05 DIAGNOSIS — K263 Acute duodenal ulcer without hemorrhage or perforation: Secondary | ICD-10-CM

## 2014-02-05 DIAGNOSIS — T3995XA Adverse effect of unspecified nonopioid analgesic, antipyretic and antirheumatic, initial encounter: Secondary | ICD-10-CM | POA: Diagnosis not present

## 2014-02-05 DIAGNOSIS — N183 Chronic kidney disease, stage 3 unspecified: Secondary | ICD-10-CM | POA: Diagnosis present

## 2014-02-05 DIAGNOSIS — I129 Hypertensive chronic kidney disease with stage 1 through stage 4 chronic kidney disease, or unspecified chronic kidney disease: Secondary | ICD-10-CM | POA: Diagnosis present

## 2014-02-05 DIAGNOSIS — M7989 Other specified soft tissue disorders: Secondary | ICD-10-CM | POA: Diagnosis present

## 2014-02-05 DIAGNOSIS — E8809 Other disorders of plasma-protein metabolism, not elsewhere classified: Secondary | ICD-10-CM | POA: Diagnosis present

## 2014-02-05 DIAGNOSIS — E7800 Pure hypercholesterolemia, unspecified: Secondary | ICD-10-CM | POA: Diagnosis present

## 2014-02-05 HISTORY — DX: Acute kidney failure, unspecified: N17.9

## 2014-02-05 HISTORY — DX: Hyperlipidemia, unspecified: E78.5

## 2014-02-05 HISTORY — DX: Diverticulosis of large intestine without perforation or abscess without bleeding: K57.30

## 2014-02-05 HISTORY — DX: Gout, unspecified: M10.9

## 2014-02-05 HISTORY — DX: Chronic kidney disease, stage 3 unspecified: N18.30

## 2014-02-05 HISTORY — DX: Analgesic nephropathy: N14.0

## 2014-02-05 LAB — COMPREHENSIVE METABOLIC PANEL
ALT: 8 U/L (ref 0–55)
AST (SGOT): 17 U/L (ref 5–34)
Albumin/Globulin Ratio: 1.2 (ref 0.9–2.2)
Albumin: 3.6 g/dL (ref 3.5–5.0)
Alkaline Phosphatase: 60 U/L (ref 38–106)
Anion Gap: 17 — ABNORMAL HIGH (ref 5.0–15.0)
BUN: 48.5 mg/dL — ABNORMAL HIGH (ref 9.0–28.0)
Bilirubin, Total: 0.3 mg/dL (ref 0.2–1.2)
CO2: 16 mEq/L — ABNORMAL LOW (ref 22–29)
Calcium: 9.2 mg/dL (ref 7.9–10.2)
Chloride: 106 mEq/L (ref 100–111)
Creatinine: 3 mg/dL — ABNORMAL HIGH (ref 0.7–1.3)
Globulin: 3 g/dL (ref 2.0–3.6)
Glucose: 110 mg/dL — ABNORMAL HIGH (ref 70–100)
Potassium: 4.4 mEq/L (ref 3.5–5.1)
Protein, Total: 6.6 g/dL (ref 6.0–8.3)
Sodium: 139 mEq/L (ref 136–145)

## 2014-02-05 LAB — CBC AND DIFFERENTIAL
Basophils Absolute Automated: 0.02 10*3/uL (ref 0.00–0.20)
Basophils Automated: 0 %
Eosinophils Absolute Automated: 0.09 10*3/uL (ref 0.00–0.70)
Eosinophils Automated: 1 %
Hematocrit: 34.9 % — ABNORMAL LOW (ref 42.0–52.0)
Hgb: 11 g/dL — ABNORMAL LOW (ref 13.0–17.0)
Immature Granulocytes Absolute: 0.05 10*3/uL
Immature Granulocytes: 1 %
Lymphocytes Absolute Automated: 1.26 10*3/uL (ref 0.50–4.40)
Lymphocytes Automated: 14 %
MCH: 29.4 pg (ref 28.0–32.0)
MCHC: 31.5 g/dL — ABNORMAL LOW (ref 32.0–36.0)
MCV: 93.3 fL (ref 80.0–100.0)
MPV: 10.1 fL (ref 9.4–12.3)
Monocytes Absolute Automated: 0.82 10*3/uL (ref 0.00–1.20)
Monocytes: 9 %
Neutrophils Absolute: 6.67 10*3/uL (ref 1.80–8.10)
Neutrophils: 75 %
Platelets: 334 10*3/uL (ref 140–400)
RBC: 3.74 10*6/uL — ABNORMAL LOW (ref 4.70–6.00)
RDW: 13 % (ref 12–15)
WBC: 8.86 10*3/uL (ref 3.50–10.80)

## 2014-02-05 LAB — GFR: EGFR: 20.8

## 2014-02-05 LAB — LIPASE: Lipase: 57 U/L (ref 8–78)

## 2014-02-05 MED ORDER — FAMOTIDINE 10 MG/ML IV SOLN (WRAP)
20.0000 mg | Freq: Once | INTRAVENOUS | Status: DC
Start: 2014-02-05 — End: 2014-02-05
  Filled 2014-02-05: qty 2

## 2014-02-05 MED ORDER — SODIUM CHLORIDE 0.9 % IV SOLN
150.0000 mL/h | INTRAVENOUS | Status: DC
Start: 2014-02-05 — End: 2014-02-06

## 2014-02-05 MED ORDER — SODIUM CHLORIDE 0.9 % IV SOLN
8.0000 mg/h | INTRAVENOUS | Status: DC
Start: 2014-02-05 — End: 2014-02-08
  Administered 2014-02-06 – 2014-02-08 (×6): 8 mg/h via INTRAVENOUS
  Filled 2014-02-05 (×10): qty 80

## 2014-02-05 MED ORDER — PANTOPRAZOLE SODIUM 40 MG IV SOLR
80.0000 mg | Freq: Once | INTRAVENOUS | Status: AC
Start: 2014-02-05 — End: 2014-02-06
  Administered 2014-02-06: 80 mg via INTRAVENOUS
  Filled 2014-02-05: qty 80

## 2014-02-05 MED ORDER — HYDRALAZINE HCL 20 MG/ML IJ SOLN
10.0000 mg | Freq: Once | INTRAMUSCULAR | Status: AC
Start: 2014-02-05 — End: 2014-02-06
  Administered 2014-02-06: 10 mg via INTRAVENOUS
  Filled 2014-02-05: qty 1

## 2014-02-05 MED ORDER — HYDROMORPHONE HCL PF 1 MG/ML IJ SOLN
0.5000 mg | Freq: Once | INTRAMUSCULAR | Status: AC
Start: 2014-02-05 — End: 2014-02-05
  Administered 2014-02-05: 0.5 mg via INTRAVENOUS
  Filled 2014-02-05: qty 1

## 2014-02-05 MED ORDER — SODIUM CHLORIDE 0.9 % IV BOLUS
1000.0000 mL | Freq: Once | INTRAVENOUS | Status: AC
Start: 2014-02-05 — End: 2014-02-05
  Administered 2014-02-05: 1000 mL via INTRAVENOUS

## 2014-02-05 MED ORDER — ONDANSETRON HCL 4 MG/2ML IJ SOLN
4.0000 mg | Freq: Once | INTRAMUSCULAR | Status: AC
Start: 2014-02-05 — End: 2014-02-05
  Administered 2014-02-05: 4 mg via INTRAVENOUS
  Filled 2014-02-05: qty 2

## 2014-02-05 MED ORDER — IOHEXOL 240 MG/ML IJ SOLN
50.0000 mL | Freq: Once | INTRAMUSCULAR | Status: AC
Start: 2014-02-05 — End: 2014-02-05
  Administered 2014-02-05: 50 mL via ORAL

## 2014-02-05 NOTE — ED Provider Notes (Signed)
Physician/Midlevel provider first contact with patient: 02/05/14 2118         History     Chief Complaint   Patient presents with   . Abdominal Pain     HPI Comments: Patient had a cholecystectomy on April 27.  Since then he has had persistent upper abdominal pain.  States his reading his lead him to believe he could possibly have pancreatitis.  However, the pain is not subsided and that is wise here for evaluation.  He does typically take ibuprofen, which alleviates the pain.  There is a past history of peptic and gastric ulcers.  He reports no darkened stools or blood per rectum.  No severe pain.    Patient is a 73 y.o. male presenting with abdominal pain. The history is provided by the patient.   Abdominal Pain  Pain location:  Epigastric  Pain quality: not aching, not bloating, not burning, no fullness, not sharp, not shooting and not squeezing    Pain radiates to:  Does not radiate  Pain severity:  No pain  Onset quality:  Sudden  Timing:  Constant  Progression:  Worsening  Chronicity:  New  Context: not alcohol use    Relieved by:  Nothing  Worsened by:  Nothing tried  Ineffective treatments:  None tried  Associated symptoms: no chest pain, no chills, no dysuria, no fever and no shortness of breath        Past Medical History   Diagnosis Date   . Hypertension    . Chronic ulcer of unspecified site      GOUTY ARTHRITIS  R BIG TOE PAINFUL,NO FLARE UPS FOR A LONG TIME. PER PATIENT   . Bilateral cataracts      B/L CATARACT SX  WITH IMPLANTS,SX DONE 10 YEARS AGO   . CKD (chronic kidney disease), stage III    . Diverticulosis of colon    . Upper gastrointestinal bleed 01/09/2007     Due to duodenal ulcer.   . Gout    . HLD (hyperlipidemia)        Past Surgical History   Procedure Laterality Date   . Joint replacement       R KNEE ARTHROSCOPY 20 YEARS AGO   . Robotic, cholecystectomy, single port  01/10/2014     Cholesterolosis of gallbladder    . Esophagogastroduodenoscopy  01/09/2007     Hiatal hernia, 2-3 cm.  Duodenitis. Gastritis. Duodenal ulcer with no visible vessel seen.  A pH of 4.5.       History reviewed. No pertinent family history.    Social  History   Substance Use Topics   . Smoking status: Never Smoker    . Smokeless tobacco: Never Used   . Alcohol Use: 4.2 oz/week     7 Glasses of wine per week       .     No Known Allergies    Current Discharge Medication List      CONTINUE these medications which have NOT CHANGED    Details   colchicine 0.6 MG tablet Take 0.6 mg by mouth every evening.      valsartan-hydrochlorothiazide (DIOVAN-HCT) 320-25 MG per tablet Take 1 tablet by mouth daily.      acetaminophen-codeine (TYLENOL #4) 300-60 MG per tablet Take 1 tablet by mouth every 4 (four) hours as needed.      allopurinol (ZYLOPRIM) 100 MG tablet Take 100 mg by mouth every evening.         baclofen (LIORESAL) 10  MG tablet Take 10 mg by mouth Daily after dinner.      hydrALAZINE (APRESOLINE) 50 MG tablet Take 50 mg by mouth 2 (two) times daily.      pantoprazole (PROTONIX) 40 MG tablet Take 40 mg by mouth daily.      pravastatin (PRAVACHOL) 40 MG tablet Take 40 mg by mouth every evening.         verapamil (VERELAN PM) 240 MG 24 hr capsule Take 240 mg by mouth nightly.                 Review of Systems   Constitutional: Negative for fever, chills and activity change.   Eyes: Negative for photophobia and pain.   Respiratory: Negative for apnea and shortness of breath.    Cardiovascular: Negative for chest pain and palpitations.   Gastrointestinal: Positive for abdominal pain. Negative for abdominal distention.   Genitourinary: Negative for dysuria, frequency, flank pain and difficulty urinating.   Musculoskeletal: Negative for back pain, joint swelling, neck pain and neck stiffness.   Skin: Negative for color change and rash.   Neurological: Negative for dizziness and headaches.   Hematological: Negative for adenopathy. Does not bruise/bleed easily.   Psychiatric/Behavioral: Negative for agitation. The patient is not  nervous/anxious.    All other systems reviewed and are negative.      Physical Exam    BP: 186/84 mmHg, Heart Rate: 58, Temp: 97.6 F (36.4 C), Resp Rate: 18, SpO2: 98 %, Weight: 84.823 kg    Physical Exam   Constitutional: He is oriented to person, place, and time. He appears well-developed and well-nourished. No distress.   HENT:   Head: Normocephalic and atraumatic.   Eyes: Conjunctivae and EOM are normal. Pupils are equal, round, and reactive to light.   Neck: Normal range of motion. Neck supple. Carotid bruit is not present.   Cardiovascular: Normal rate and regular rhythm.  Exam reveals no friction rub.    No murmur heard.  Pulmonary/Chest: Effort normal and breath sounds normal. No stridor. No respiratory distress. He has no wheezes. He exhibits no tenderness.   Abdominal: Soft. He exhibits no distension. There is tenderness (epigastric ttp). There is no rebound and no guarding.   Musculoskeletal: He exhibits no edema or tenderness.   Neurological: He is alert and oriented to person, place, and time. No cranial nerve deficit. Coordination normal.   Skin: Skin is warm, dry and intact. He is not diaphoretic. No erythema.   Psychiatric: He has a normal mood and affect. His behavior is normal. Judgment normal.   Nursing note and vitals reviewed.      MDM and ED Course     ED Medication Orders    Start     Status Ordering Provider    02/05/14 2349  pantoprazole (PROTONIX) injection 80 mg   Once     Route: Intravenous  Ordered Dose: 80 mg     Last MAR action:  Given Lizbett Garciagarcia J    02/05/14 2349  pantoprazole (PROTONIX) 80 mg in sodium chloride 0.9 % 100 mL infusion   Continuous     Route: Intravenous  Ordered Dose: 8 mg/hr     Last MAR action:  [employer], Sholonda Jobst J    02/05/14 2346  hydrALAZINE (APRESOLINE) injection 10 mg   Once     Route: Intravenous  Ordered Dose: 10 mg     Last MAR action:  Given Kanaya Gunnarson J    02/05/14 2344  Continuous,   Status:  Discontinued     Route: Intravenous   Ordered Dose: 150 mL/hr     Discontinued Kayti Poss J    02/05/14 2330     Once in ED,   Status:  Discontinued     Route: Intravenous  Ordered Dose: 20 mg     Discontinued Roselee Culver J    02/05/14 2218  sodium chloride 0.9 % bolus 1,000 mL   Once     Route: Intravenous  Ordered Dose: 1,000 mL     Last MAR action:  [employer], Krishana Lutze J    02/05/14 2130  HYDROmorphone (DILAUDID) injection 0.5 mg   Once     Route: Intravenous  Ordered Dose: 0.5 mg     Last MAR action:  Given Cassandria Santee, Deston Bilyeu J    02/05/14 2130  ondansetron (ZOFRAN) injection 4 mg   Once     Route: Intravenous  Ordered Dose: 4 mg     Last MAR action:  Given Kiyonna Tortorelli J    02/05/14 2109  sodium chloride 0.9 % bolus 1,000 mL   Once     Route: Intravenous  Ordered Dose: 1,000 mL     Last MAR action:  Stopped Garren Greenman J    02/05/14 2109  iohexol (OMNIPAQUE) 240 MG/ML IV/ORAL solution 50 mL   Once     Route: Oral  Ordered Dose: 50 mL     Last MAR action:  Given Quashon Jesus J           MDM  Number of Diagnoses or Management Options  Acute kidney injury:   Duodenal ulcer, acute:   Diagnosis management comments: I, Roselee Culver, DO (emergency medicine physician), have been the primary provider for this patient during this Emergency Dept visit.     I have reviewed nursing notes on family and social history, past medical issues, and recorded vital signs.    I have reviewed all laboratory tests and radiographic studies and have explained these results to the patient and/or family bedside.    Oxygen saturation by pulse oximetry is 95%-100%, Normal.  Interventions: Patient Observed.    DDX: Pancreatitis, biliary disease, peptic or gastric ulcer, gastroesophageal reflux disease, postoperative complication    Discussed possibilities with the patient.  CAT scan ordered pain medicine ordered IV fluids, given.    Penetrating but not perforated ulcer present on CT scan- pt may be at increased risk for perforation, this is explained, pt  must discontinue heavy NSAID use, advised admission for gi consult and IV ppi for now.     Discussed with Dr Mady Haagensen for admission.       Amount and/or Complexity of Data Reviewed  Clinical lab tests: ordered and reviewed  Tests in the radiology section of CPT: ordered and reviewed  Tests in the medicine section of CPT: ordered and reviewed  Decide to obtain previous medical records or to obtain history from someone other than the patient: yes  Obtain history from someone other than the patient: yes  Review and summarize past medical records: yes  Discuss the patient with other providers: yes  Independent visualization of images, tracings, or specimens: yes      Results    Procedure Component Value Units Date/Time    IRON PROFILE [[id number]] Collected:  02/07/14 0628     Updated:  02/07/14 1221    Lipid panel [[id number]] Collected:  02/07/14 0628    Specimen Information:  Blood Updated:  02/07/14 1221  Narrative:      Fasting specimen    Ferritin [[id number]] Collected:  02/07/14 0628    Specimen Information:  Blood Updated:  02/07/14 1221    Comprehensive metabolic panel [[id number]]  (Abnormal) Collected:  02/07/14 0628    Specimen Information:  Blood Updated:  02/07/14 0719     Glucose 94 mg/dL      BUN 40.9 mg/dL      Creatinine 1.8 (H) mg/dL      Sodium 811 mEq/L      Potassium 4.1 mEq/L      Chloride 114 (H) mEq/L      CO2 20 (L) mEq/L      CALCIUM 8.6 mg/dL      Protein, Total 4.8 (L) g/dL      Albumin 2.6 (L) g/dL      AST (SGOT) 11 U/L      ALT 6 U/L      Alkaline Phosphatase 44 U/L      Bilirubin, Total 0.4 mg/dL      Globulin 2.2 g/dL      Albumin/Globulin Ratio 1.2      Anion Gap 9.0     GFR [[id number]] Collected:  02/07/14 0628     EGFR 37.4 Updated:  02/07/14 0719    Magnesium [[id number]] Collected:  02/07/14 0628    Specimen Information:  Blood Updated:  02/07/14 0719     Magnesium 1.6 mg/dL     CBC without differential [[id number]]  (Abnormal) Collected:  02/07/14 0628    Specimen Information:   Blood / Blood Updated:  02/07/14 0656     WBC 4.37 x10 3/uL      RBC 3.11 (L) x10 6/uL      Hgb 8.9 (L) g/dL      Hematocrit 46.9 (L) %      MCV 94.2 fL      MCH 28.6 pg      MCHC 30.4 (L) g/dL      RDW 13 %      Platelets 218 x10 3/uL      MPV 10.1 fL     H. pylori Antibody, IgG [[id number]] Collected:  02/06/14 0756    Specimen Information:  Blood Updated:  02/06/14 2054     Helicobacter Pylori Antibody Negative               Procedures    Clinical Impression & Disposition     Clinical Impression  Final diagnoses:   Duodenal ulcer, acute   Acute kidney injury        ED Disposition    Admit Admitting Physician: Freddi Che LYN [[id number]]  Diagnosis: Acute kidney injury [244010]  Estimated Length of Stay: 3 - 5 Days  Tentative Discharge Plan?: Home or Self Care [1]  Patient Class: Inpatient [101]  I certify that inpatient services are medically necessary for this patient. Please see H&P and MD progress notes for additional information about the patient's course of treatment. For Medicare patients, services provided in accordance with 412.3 and expected LOS to be greater than 2 midnights for Medicare patients.: Yes             Current Discharge Medication List                    Toni Arthurs, DO  02/07/14 1230

## 2014-02-05 NOTE — ED Notes (Signed)
Pt sts he had his gallbladder on April 27, pt sts having pain to RUQ area ever since the surgery, pt sts he thinks its pancreatitis but has no hx of this, pt sts pain wraps around to his back, pt has seen his MD, who sts maybe its just post op pain, pt sts "i thought maybe it was just old age". Pt sts he has been taking motrin around the clock. Denies any n/v/d/constipation/fever/dysuria.

## 2014-02-06 DIAGNOSIS — D509 Iron deficiency anemia, unspecified: Secondary | ICD-10-CM

## 2014-02-06 DIAGNOSIS — R109 Unspecified abdominal pain: Secondary | ICD-10-CM | POA: Insufficient documentation

## 2014-02-06 DIAGNOSIS — R700 Elevated erythrocyte sedimentation rate: Secondary | ICD-10-CM

## 2014-02-06 DIAGNOSIS — E8729 Other acidosis: Secondary | ICD-10-CM | POA: Diagnosis present

## 2014-02-06 DIAGNOSIS — E872 Acidosis, unspecified: Secondary | ICD-10-CM

## 2014-02-06 DIAGNOSIS — I10 Essential (primary) hypertension: Secondary | ICD-10-CM | POA: Diagnosis present

## 2014-02-06 DIAGNOSIS — K263 Acute duodenal ulcer without hemorrhage or perforation: Secondary | ICD-10-CM | POA: Diagnosis present

## 2014-02-06 DIAGNOSIS — N183 Chronic kidney disease, stage 3 unspecified: Secondary | ICD-10-CM

## 2014-02-06 DIAGNOSIS — K269 Duodenal ulcer, unspecified as acute or chronic, without hemorrhage or perforation: Secondary | ICD-10-CM

## 2014-02-06 DIAGNOSIS — N179 Acute kidney failure, unspecified: Secondary | ICD-10-CM

## 2014-02-06 HISTORY — DX: Unspecified abdominal pain: R10.9

## 2014-02-06 HISTORY — DX: Elevated erythrocyte sedimentation rate: R70.0

## 2014-02-06 HISTORY — DX: Iron deficiency anemia, unspecified: D50.9

## 2014-02-06 LAB — URINALYSIS
Bilirubin, UA: NEGATIVE
Blood, UA: NEGATIVE
Glucose, UA: NEGATIVE
Ketones UA: NEGATIVE
Leukocyte Esterase, UA: NEGATIVE
Nitrite, UA: NEGATIVE
Protein, UR: NEGATIVE
Specific Gravity UA: 1.01 (ref 1.001–1.035)
Urine pH: 6 (ref 5.0–8.0)
Urobilinogen, UA: 0.2 mg/dL (ref 0.2–2.0)

## 2014-02-06 LAB — H. PYLORI ANTIBODY, IGG: Helicobacter Pylori Antibody: NEGATIVE

## 2014-02-06 LAB — SEDIMENTATION RATE: Sed Rate: 31 mm/Hr — ABNORMAL HIGH (ref 0–20)

## 2014-02-06 LAB — COMPREHENSIVE METABOLIC PANEL
ALT: 8 U/L (ref 0–55)
AST (SGOT): 11 U/L (ref 5–34)
Albumin/Globulin Ratio: 1.3 (ref 0.9–2.2)
Albumin: 2.8 g/dL — ABNORMAL LOW (ref 3.5–5.0)
Alkaline Phosphatase: 42 U/L (ref 38–106)
Anion Gap: 9 (ref 5.0–15.0)
BUN: 39 mg/dL — ABNORMAL HIGH (ref 9.0–28.0)
Bilirubin, Total: 0.4 mg/dL (ref 0.2–1.2)
CO2: 21 mEq/L — ABNORMAL LOW (ref 22–29)
Calcium: 8 mg/dL (ref 7.9–10.2)
Chloride: 110 mEq/L (ref 100–111)
Creatinine: 2.4 mg/dL — ABNORMAL HIGH (ref 0.7–1.3)
Globulin: 2.1 g/dL (ref 2.0–3.6)
Glucose: 92 mg/dL (ref 70–100)
Potassium: 3.7 mEq/L (ref 3.5–5.1)
Protein, Total: 4.9 g/dL — ABNORMAL LOW (ref 6.0–8.3)
Sodium: 140 mEq/L (ref 136–145)

## 2014-02-06 LAB — PHOSPHORUS: Phosphorus: 3.7 mg/dL (ref 2.3–4.7)

## 2014-02-06 LAB — CBC
Hematocrit: 29.4 % — ABNORMAL LOW (ref 42.0–52.0)
Hgb: 9.1 g/dL — ABNORMAL LOW (ref 13.0–17.0)
MCH: 29.2 pg (ref 28.0–32.0)
MCHC: 31 g/dL — ABNORMAL LOW (ref 32.0–36.0)
MCV: 94.2 fL (ref 80.0–100.0)
MPV: 9.7 fL (ref 9.4–12.3)
Platelets: 231 10*3/uL (ref 140–400)
RBC: 3.12 10*6/uL — ABNORMAL LOW (ref 4.70–6.00)
RDW: 13 % (ref 12–15)
WBC: 5.48 10*3/uL (ref 3.50–10.80)

## 2014-02-06 LAB — GFR: EGFR: 26.8

## 2014-02-06 LAB — MAGNESIUM: Magnesium: 1.6 mg/dL (ref 1.6–2.6)

## 2014-02-06 MED ORDER — ENOXAPARIN SODIUM 30 MG/0.3ML SC SOLN
30.0000 mg | Freq: Every evening | SUBCUTANEOUS | Status: DC
Start: 2014-02-06 — End: 2014-02-07
  Administered 2014-02-06 – 2014-02-07 (×2): 30 mg via SUBCUTANEOUS
  Filled 2014-02-06 (×2): qty 0.3

## 2014-02-06 MED ORDER — ZOLPIDEM TARTRATE 5 MG PO TABS
10.0000 mg | ORAL_TABLET | Freq: Every evening | ORAL | Status: DC | PRN
Start: 2014-02-06 — End: 2014-02-08
  Administered 2014-02-07 – 2014-02-08 (×2): 10 mg via ORAL
  Filled 2014-02-06 (×2): qty 2

## 2014-02-06 MED ORDER — SODIUM CHLORIDE 0.9 % IV SOLN
INTRAVENOUS | Status: DC
Start: 2014-02-06 — End: 2014-02-07

## 2014-02-06 MED ORDER — HYDRALAZINE HCL 20 MG/ML IJ SOLN
10.0000 mg | Freq: Four times a day (QID) | INTRAMUSCULAR | Status: DC
Start: 2014-02-06 — End: 2014-02-08
  Administered 2014-02-06 – 2014-02-08 (×9): 10 mg via INTRAVENOUS
  Filled 2014-02-06 (×8): qty 1

## 2014-02-06 MED ORDER — CLONIDINE HCL 0.2 MG/24HR TD PTWK
1.0000 | MEDICATED_PATCH | TRANSDERMAL | Status: DC
Start: 2014-02-06 — End: 2014-02-08
  Administered 2014-02-06: 1 via TRANSDERMAL
  Filled 2014-02-06: qty 1

## 2014-02-06 MED ORDER — HYDRALAZINE HCL 20 MG/ML IJ SOLN
10.0000 mg | Freq: Four times a day (QID) | INTRAMUSCULAR | Status: DC | PRN
Start: 2014-02-06 — End: 2014-02-08
  Administered 2014-02-07: 10 mg via INTRAVENOUS
  Filled 2014-02-06 (×3): qty 1

## 2014-02-06 MED ORDER — SODIUM CHLORIDE 0.9 % IV MBP
2.0000 g | INTRAVENOUS | Status: DC
Start: 2014-02-06 — End: 2014-02-08
  Administered 2014-02-06 – 2014-02-08 (×3): 2 g via INTRAVENOUS
  Filled 2014-02-06 (×3): qty 2

## 2014-02-06 MED ORDER — HYDROMORPHONE HCL PF 1 MG/ML IJ SOLN
1.0000 mg | INTRAMUSCULAR | Status: DC | PRN
Start: 2014-02-06 — End: 2014-02-07

## 2014-02-06 MED ORDER — ONDANSETRON HCL 4 MG/2ML IJ SOLN
4.0000 mg | INTRAMUSCULAR | Status: DC | PRN
Start: 2014-02-06 — End: 2014-02-08

## 2014-02-06 NOTE — Progress Notes (Signed)
Pt was concern regarding his endoscopy. Talked to Dr.traficante. Started regular diet then NPO after midnight. waiting for AM Dr. to decide if they want to discharge him and do endoscopy out patient or keep him till Tuesday.

## 2014-02-06 NOTE — Plan of Care (Signed)
Problem: Health Promotion  Goal: Knowledge - health resources  Extent of understanding and conveyed about healthcare resources.   Outcome: Progressing  Plan of care was discussed with patient at the beginning of the shift.          Problem: Safety  Goal: Patient will be free from injury during hospitalization  Outcome: Progressing  Call bell and personal belongings are within reach.  Rounding completed during the shift per protocol.  Safe environment maintained during the shift.    Problem: Pain  Goal: Patient's pain/discomfort is manageable  Outcome: Progressing  Patient denies pain, will continue to monitor.          Problem: Moderate/High Fall Risk Score >/=15  Goal: Patient will remain free of falls  Outcome: Progressing  Patient is able to transfer safely with steady gait.

## 2014-02-06 NOTE — Consults (Signed)
GASTEROENTEROLOGY CONSULTATION    Date Time: 02/06/2014 9:08 AM  Patient Name: Chad Massey  Requesting Physician: Sunny Schlein, MD       Reason for Consultation:     Abdominal pain      History:   BLANCHE GALLIEN is a 73 y.o. male who presents to the hospital on 02/05/2014 with h/o recent choley, htn, high chol, gout who presented with persistent abdominal pain.  States that he has had abdominal pain since his surgery.  Was taking 4-5 ibuprofens daily.  Denies any melena, n/v/hematemesis.  States that the pain radiates to his back.  Ct of abdomen here showed a possible duodenal ulcer vs diverticulitis.  No biloma or abscess seen.  Liver tests are normal.  Today, pt states that he is feeling better.       Past Medical History:     Past Medical History   Diagnosis Date   . Hypertension    . Chronic ulcer of unspecified site      GOUTY ARTHRITIS  R BIG TOE PAINFUL,NO FLARE UPS FOR A LONG TIME. PER PATIENT   . Bilateral cataracts      B/L CATARACT SX  WITH IMPLANTS,SX DONE 10 YEARS AGO   . Renal insufficiency        Past Surgical History:     Past Surgical History   Procedure Laterality Date   . Joint replacement       R KNEE ARTHROSCOPY 20 YEARS AGO   . Robotic, cholecystectomy, single port  01/10/2014     Procedure: ROBOT ASSISTED, LAPAROSCOPIC, CHOLECYSTECTOMY, SINGLE PORT;  Surgeon: Jerel Shepherd, MD;  Location: [facility];  Service: General;  Laterality: N/A;  ROBOTIC, CHOLECYSTECTOMY, SINGLE PORT        Family History:   History reviewed. No pertinent family history.    Social History:     History     Social History   . Marital Status: Married     Spouse Name: N/A     Number of Children: N/A   . Years of Education: N/A     Social History Main Topics   . Smoking status: Never Smoker    . Smokeless tobacco: Never Used   . Alcohol Use: 4.2 oz/week     7 Glasses of wine per week   . Drug Use: Not on file   . Sexual Activity: Not on file     Other Topics Concern   . Not on file     Social History  Narrative       Allergies:   No Known Allergies    Medications:     Current Facility-Administered Medications   Medication Dose Route Frequency   . cefepime  2 g Intravenous Q24H SCH   . enoxaparin  30 mg Subcutaneous QHS   . hydrALAZINE  10 mg Intravenous 4 times per day       Review of Systems:     Heent: no h/a  Lungs: no sob  Heart: no chest pain  Abd: as above      Physical Exam:     Filed Vitals:    02/06/14 0629   BP: 119/57   Pulse: 62   Temp: 98.1 F (36.7 C)   Resp: 16   SpO2: 100%     General appearance - alert, well appearing, and in no distress  Eyes - pupils equal and reactive, extraocular eye movements intact  Mouth - mucous membranes moist, pharynx normal without lesions  Chest - clear to auscultation, no wheezes, rales or rhonchi, symmetric air entry  Heart - normal rate, regular rhythm, normal S1, S2, no murmurs, rubs, clicks or gallops  Abdomen - soft, tender but no guarding  Extremities - peripheral pulses normal, no pedal edema, no clubbing or cyanosis  Skin - normal coloration and turgor, no rashes, no suspicious skin lesions noted    Labs Reviewed:     Recent CBC   Recent Labs      02/06/14   0757   RBC  3.12*   Hgb  9.1*   Hematocrit  29.4*   MCV  94.2   MCH  29.2   MCHC  31.0*   RDW  13   MPV  9.7     Recent CMP   Recent Labs      02/06/14   0757   Glucose  92   BUN  39.0*   Creatinine  2.4*   Sodium  140   CO2  21*   CALCIUM  8.0   Albumin  2.8*   AST (SGOT)  11   ALT  8   Globulin  2.1       Radiology:   Radiological Procedure reviewed.       Assessment:     1.  Abdominal pain:  Stable.  CT noted.  Normal liver tests, no abscess or biloma make a bile leak unlikely.  Is s/p recent lap choley.   Use of ibuprofens, appearance of duodenum are c/w probable Duodenal ulcer.  Diverticulitis possible but less likely.        Plan:     -protonix drip for now  -iv abx  -keep npo for now  -monitor labs  -monitor renal function, consult renal if does not improve with hydration and stopping  ibuprofen  -once improved, will plan EGD later in the week

## 2014-02-06 NOTE — H&P (Signed)
ADMISSION HISTORY AND PHYSICAL EXAM    Date Time: 02/06/2014 0300  Patient Name: Chad Massey  Attending Physician: Sunny Schlein, MD  Primary Care Physician: Alycia Patten, NP    Assessment and Plan:   Acute kidney injury:   This is likely due to nonsteroidal anti-inflammatory drug use. And dehydration.  IV hydration and follow trends.  If it does not improve with hydration, we will consult nephrology.  His baseline creatinine is 1.3-1.5 in Epic.  His current creatinine of 3.0 meets [place] criteria for acute kidney injury.    Abdominal pain, likely due to duodenal ulcer:  Nothing by mouth for now in case they want to scope him.  Will consult GI on call later this morning.  Nothing by mouth.  Protonix infusion.    Stage III CKD:  His baseline creatinine is 1.3-1.5 in Epic in 2007-2008.  There are no more recent labs in Epic.    Labile hypertension:  He is nothing by mouth and his oral antihypertensives have been held.  We will utilize IV hydralazine has needed with parameters.  We will avoid beta blockers for now since he is relatively bradycardic at 58-62.    Hyperlipidemia:  His LFTs are normal and we will restart his statin once he is no longer nothing by mouth.    Microcytic anemia:  This is likely due to upper gastrointestinal bleeding.  We will check iron studies.  Further workup can be done on an outpatient basis.    VTE prophylaxis with renal dose Lovenox.    GI prophylaxis with Protonix infusion.    Code Status: Full code.     Disposition:  Inpatient admission status due to his advanced age, acute kidney injury, probable penetrating duodenal ulcer.    Total time spent was 60 minutes, with greater than 50 percent of time spent (50 minutes) on counseling, coordination of care, and treatment plan as noted above.  This was discussed at length with the patient and he verbalized understanding.       CC:    73 y.o. male who presents to the hospital with abdominal pain.    History of Presenting Illness:    This delightful 73 year old male had a robotic laparoscopic cholecystectomy on January 10, 2014.  Since that time he has persistent upper abdominal pain as well has abdominal bloating and distention.  He has been taking 4-5 ibuprofens daily.  In the last 24-48 hours his abdominal pain has radiated through to the back.  Denies associated fever, chills, sweats, nausea, vomiting, diarrhea, constipation, melena, hematochezia, hematemesis.  He does have a history of an upper gastrointestinal bleed in 2008.  EGD at that time showed a nonbleeding duodenal ulcer.  Helicobacter pylori screen was negative.  Abdominal pelvic CT in the emergency department showed duodenal diverticulitis versus penetrating ulcer.  He currently is only complaining of mild epigastric pain    Past Medical History:     Past Medical History   Diagnosis Date   . Hypertension    . Chronic ulcer of unspecified site      GOUTY ARTHRITIS  R BIG TOE PAINFUL,NO FLARE UPS FOR A LONG TIME. PER PATIENT   . Bilateral cataracts      B/L CATARACT SX  WITH IMPLANTS,SX DONE 10 YEARS AGO   . CKD (chronic kidney disease), stage III    . Diverticulosis of colon    . Upper gastrointestinal bleed due to duodenal ulcer  01/09/2007   . Gouty arthritis, right great  toe         Past Surgical History:     Procedure Laterality Date   . Joint replacement [place]      R KNEE ARTHROSCOPY 20 YEARS AGO   . Robotic, cholecystectomy, single port  01/10/2014     Cholesterolosis of gallbladder    . Esophagogastroduodenoscopy  01/09/2007     Hiatal hernia, 2-3 cm. Duodenitis. Gastritis. Duodenal ulcer with no visible vessel seen.  A pH of 4.5.   Bilateral cataract extractions    Family History:   History reviewed. No pertinent family history.    Social History:     Marland Kitchen Marital Status: Married     . Smoking status: Never Smoker    . Smokeless tobacco: Never Used   . Alcohol Use:  7 glasses of wine per week    . Drug Use: No       Allergies:   No Known Allergies    Medications:      Prescriptions prior to admission   Medication Sig   . colchicine 0.6 MG tablet Take 0.6 mg by mouth every evening.   . valsartan-hydrochlorothiazide (DIOVAN-HCT) 320-25 MG per tablet Take 1 tablet by mouth daily.   Marland Kitchen acetaminophen-codeine (TYLENOL #4) 300-60 MG per tablet Take 1 tablet by mouth every 4 (four) hours as needed.   Marland Kitchen allopurinol (ZYLOPRIM) 100 MG tablet Take 100 mg by mouth every evening.      . baclofen (LIORESAL) 10 MG tablet Take 10 mg by mouth Daily after dinner.   . hydrALAZINE (APRESOLINE) 50 MG tablet Take 50 mg by mouth 2 (two) times daily.   . pantoprazole (PROTONIX) 40 MG tablet Take 40 mg by mouth daily.   . pravastatin (PRAVACHOL) 40 MG tablet Take 40 mg by mouth every evening.      . verapamil (VERELAN PM) 240 MG 24 hr capsule Take 240 mg by mouth nightly.          Review of Systems:   Constitutional: denies fever, chills, sweats or rigors  HEENT: denies visual disturbances, otalgia, sore throat, sinus symptoms, tinnitus  Cardiovascular:  denies chest pain or discomfort, palpitations,   Respiratory: denies shortness of breath, DOE, PND, orthopnea, cough  Endocrine:  denies heat or cold intolerance  Hematologic: denies easy bruising  Gastrointestinal: denies nausea, vomiting, diarrhea, constipation, melena, hematochezia, hematemesis. See HPI.  Genitourinary: denies UTI symptoms or flank pain  Musculoskeletal: denies arthralgias, myalgias  Neurologic: denies headache, vertigo  Psychiatric: denies anxiety or depression  Skin: denies rash    Prior Diagnostics:   N/A    Physical Exam:     Filed Vitals:    02/06/14 0629   BP: 119/57   Pulse: 62   Temp: 98.1 F (36.7 C)   Resp: 16   SpO2: 100%       General: Well developed, well nourished, nontoxic, elderly male who looks much younger than his stated age in no acute distress.  HEENT: PERRLA, EOMI. Sclerae nonicteric, conjunctivae clear. Lips moist.  Neck: Supple. Nontender. Full range of motion. No lymphadenopathy, nuchal rigidity, JVD or  carotid bruit.  Cardiovascular: regular rate and rhythm, S1 S2. No murmur, rub or gallop.  Chest: Nontender to palpation or compression.  Lungs: clear to auscultation. Nonlabored respirations.  Abdomen: soft, minimal epigastric tenderness without rebound, rigidity or guarding. No other abdominal tenderness.  Non-distended; normoactive bowel sounds.  Back: No CVA or spinal tenderness.  Extremities: no clubbing, cyanosis, or edema. Normal muscle strength and tone.  Neuro:  awake, alert, oriented x 3; no focal deficits. Cranial nerves intact.  Skin: warm, dry, mucus membranes moist. Adequate turgor. No rash.  Psych: Good eye contact. Appropriate mood/affect/speech. Well groomed.      Labs and Imaging:      Ref. Range 02/05/2014 21:25 02/06/2014 07:57   WBC Latest Range: 3.50-10.80 x10 3/uL 8.86 5.48   Hemoglobin Latest Range: 13.2-17.3 g/dL 56.2 (L) 9.1 (L)   Hematocrit Latest Range: 42.0-52.0 % 34.9 (L) 29.4 (L)   Platelet Count Latest Range: 150-400 K/uL 334 231   RBC Latest Range: 4.70-6.00 x10 6/uL 3.74 (L) 3.12 (L)   MCV Latest Range: 80.0-100.0 fL 93.3 94.2   MCH, POC Latest Range: 28.0-32.0 pg 29.4 29.2   MCHC Latest Range: 32.0-36.0 g/dL 13.0 (L) 86.5 (L)   RDW Latest Range: 12-15 % 13 13   MPV Latest Range: 9.4-12.3 fL 10.1 9.7      Ref. Range 02/05/2014 21:25 02/06/2014 07:57   Glucose Latest Range: 70-100 mg/dL 784 (H) 92   BUN Latest Range: 9.0-28.0 mg/dL 69.6 (H) 29.5 (H)   Creatinine Latest Range: 0.7-1.3 mg/dL 3.0 (H) 2.4 (H)   Sodium Latest Range: 136-145 mEq/L 139 140   Potassium Latest Range: 3.5-5.1 mEq/L 4.4 3.7   Chloride Latest Range: 100-111 mEq/L 106 110   CO2 Latest Range: 22-29 mEq/L 16 (L) 21 (L)   Anion Gap Latest Range: 5.0-15.0  17.0 (H) 9.0   Calcium Latest Range: 7.9-10.2 mg/dL 9.2 8.0   Magnesium Latest Range: 1.6-2.6 mg/dL  1.6   Phosphorus Latest Range: 2.3-4.7 mg/dL  3.7   EGFR No range found 20.8 26.8   AST (SGOT) Latest Range: 5-34 U/L 17 11   ALT Latest Range: 0-55 U/L 8 8   Alkaline  Phosphatase Latest Range: 38-106 U/L 60 42   Albumin Latest Range: 3.5-5.0 g/dL 3.6 2.8 (L)   Protein, Total Latest Range: 6.3-8.2 g/dL 6.6 4.9 (L)   Globulin Latest Range: 2.0-3.6 g/dL 3.0 2.1   Albumin/Globulin Ratio Latest Range: 0.9-2.2  1.2 1.3   Bilirubin, Total Latest Range: 0.2-1.2 mg/dL 0.3 0.4   Lipase Latest Range: 8-78 U/L 57    Sed Rate Latest Range: 0-20 mm/Hr  31 (H)      Ref. Range 02/06/2014 00:11   Urine Type No range found Clean Catch   Color, UA Latest Range: Clear - Yellow  YELLOW   Clarity, UA Latest Range: Clear - Hazy  CLEAR   Specific Gravity, UA Latest Range: 1.001-1.035  1.010   Urine pH Latest Range: 5.0-8.0  6.0   Leukocyte Esterase, UA Latest Range: Negative  NEGATIVE   Nitrite, UA Latest Range: Negative  NEGATIVE   Protein, UR Latest Range: NEGATIVE  NEGATIVE   Glucose, UA Latest Range: Negative  NEGATIVE   Ketones UA Latest Range: Negative  NEGATIVE   Urobilinogen, UA Latest Range: 0.2-2.0 mg/dL 0.2   Bilirubin, UA Latest Range: Negative  NEGATIVE   Blood, UA Latest Range: NEGATIVE  NEGATIVE         Radiology Results (24 Hour)    CT Abdomen Pelvis W PO Contrast Only [[id number]] Collected:  02/05/14 2322    HISTORY:  Right upper quadrant pain. Status post robotic laparoscopic cholecystectomy.  COMPARISON:  None  FINDINGS:  Immediately distal to the duodenal bulb, there is wall thickening and prominent inflammation surrounding the second portion of the duodenum. There is contrast that extends medially into either a diverticulum or penetrating ulcer. There is no extravasation of contrast. No free  air is identified. There is no high-grade obstruction at this level, and contrast is present within more distal small bowel loops.  There is a 4.6 cm cyst within the right hepatic lobe. Multiple calcified granulomas are seen within the spleen. There is perinephric soft tissue stranding bilaterally with suggestion of mild scarring involving the right kidney. There is no hydronephrosis. Ductal  dilatation is observed although this may be secondary to cholecystectomy. The unenhanced  pancreas and adrenal glands are grossly normal. Calcified plaque is seen along portions of the abdominal aorta and iliac arteries which are normal in caliber.  Multiple diverticula are seen along the distal portion of the colon without evidence of diverticulitis. The appendix is normal. Remainder of the small bowel loops are unremarkable. No free fluid is identified. Urinary bladder is normal. 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  There is mild dependent atelectasis at the right base and minimal subsegmental atelectasis at the left base.      Impression:      1. Duodenal diverticulitis versus penetrating ulcer.  2. Likely medical renal disease with mild scarring on the right.  3. Status post cholecystectomy.  4. Colonic diverticulosis without evidence of diverticulitis.              Signed by: Stefano Gaul, DO  cc:Yew, Benard Rink, NP

## 2014-02-06 NOTE — Progress Notes (Signed)
This is a  73 year old male with past medical history of hypertension, history of peptic ulcer with history of gastrointestinal bleed disease, gout, bilateral cataract chronic kidney disease stage III, diverticulosis of the colon, who recently had robotic laparoscopic cholecystectomy on January 10, 2014. Since that time he has persistent upper abdominal pain as well has abdominal bloating and distention. He has been taking 4-5 ibuprofens daily. Abdominal pelvic CT in the emergency department showed duodenal diverticulitis versus penetrating ulcer.     1.  Abdominal pain likely secondary to peptic ulcer disease, gastrointestinal Dr. Sherryll Burger consult and recommend Protonix drip and antibiotics, cefepime as CAT scan also saying possible duodenal diverticuli.  Possible plan for endoscopy this week.  2.  Acute renal failure on chronic kidney disease.  He is baseline creatinine is 1.3-1.5.  Patient have a recent nonsteroidal anti-inflammatory drug use.  Improving hydration.  Patient primary nephrologist is Dr. Cleophus Molt will be consulted  3.  Hypertension.  Continue IV medication.  Start clonidine patch  4.  Dyslipidemia on statin.  5.  Normocytic anemia.  Continue to monitor.  6.  Metabolic acidosis likely secondary to acute renal failure, improving.  7.  Hypoalbuminemia  8.  History of gout on allopurinol and colchicine at home.    Disposition continue PPI drip and antibiotic.  Nothing by mouth at present and endoscopy this week.  Plan has been discussed with patient

## 2014-02-06 NOTE — Progress Notes (Signed)
Pain Management Plan    Education about your Pain Management.    Dear Chad Massey,    It is my pleasure to care for you during your hospitalization here at [facility]. You have reported that you are not experiencing pain at this time. If at any point you experience pain, please notify a member of the healthcare team. We are committed to meeting your unique needs during your hospitalization and can adjust your plan of care accordingly.      Thank you for your time.    Thana Farr, RN  02/06/2014  4:41 AM  [facility]  [address]  [place], [place]  [address]

## 2014-02-06 NOTE — Plan of Care (Signed)
Continues Protonix drip ,no pain this time ,patient able to walk to the hall by self.

## 2014-02-06 NOTE — Progress Notes (Signed)
Assessment completed, vss, pt resting comfortably in bed, no complaints. Call bell in reach, no needs identified. Bed locked in lowest position. Continue to monitor.

## 2014-02-06 NOTE — Progress Notes (Signed)
Assessment complete,v/s stable ,pain level 2/10,patient npo ,call bell in reach ,will continues monitor.

## 2014-02-06 NOTE — Plan of Care (Signed)
Problem: Safety  Goal: Patient will be free from injury during hospitalization  Outcome: Progressing

## 2014-02-07 DIAGNOSIS — Z029 Encounter for administrative examinations, unspecified: Secondary | ICD-10-CM

## 2014-02-07 LAB — COMPREHENSIVE METABOLIC PANEL
ALT: 6 U/L (ref 0–55)
AST (SGOT): 11 U/L (ref 5–34)
Albumin/Globulin Ratio: 1.2 (ref 0.9–2.2)
Albumin: 2.6 g/dL — ABNORMAL LOW (ref 3.5–5.0)
Alkaline Phosphatase: 44 U/L (ref 38–106)
Anion Gap: 9 (ref 5.0–15.0)
BUN: 25.9 mg/dL (ref 9.0–28.0)
Bilirubin, Total: 0.4 mg/dL (ref 0.2–1.2)
CO2: 20 mEq/L — ABNORMAL LOW (ref 22–29)
Calcium: 8.6 mg/dL (ref 7.9–10.2)
Chloride: 114 mEq/L — ABNORMAL HIGH (ref 100–111)
Creatinine: 1.8 mg/dL — ABNORMAL HIGH (ref 0.7–1.3)
Globulin: 2.2 g/dL (ref 2.0–3.6)
Glucose: 94 mg/dL (ref 70–100)
Potassium: 4.1 mEq/L (ref 3.5–5.1)
Protein, Total: 4.8 g/dL — ABNORMAL LOW (ref 6.0–8.3)
Sodium: 143 mEq/L (ref 136–145)

## 2014-02-07 LAB — GFR: EGFR: 37.4

## 2014-02-07 LAB — CBC
Hematocrit: 29.3 % — ABNORMAL LOW (ref 42.0–52.0)
Hgb: 8.9 g/dL — ABNORMAL LOW (ref 13.0–17.0)
MCH: 28.6 pg (ref 28.0–32.0)
MCHC: 30.4 g/dL — ABNORMAL LOW (ref 32.0–36.0)
MCV: 94.2 fL (ref 80.0–100.0)
MPV: 10.1 fL (ref 9.4–12.3)
Platelets: 218 10*3/uL (ref 140–400)
RBC: 3.11 10*6/uL — ABNORMAL LOW (ref 4.70–6.00)
RDW: 13 % (ref 12–15)
WBC: 4.37 10*3/uL (ref 3.50–10.80)

## 2014-02-07 LAB — IRON PROFILE
Iron Saturation: 10 % — ABNORMAL LOW (ref 15–50)
Iron: 31 ug/dL — ABNORMAL LOW (ref 40–160)
TIBC: 299 ug/dL (ref 261–462)
UIBC: 268 ug/dL (ref 126–382)

## 2014-02-07 LAB — LIPID PANEL
Cholesterol / HDL Ratio: 3.3
Cholesterol: 141 mg/dL (ref 0–199)
HDL: 43 mg/dL (ref >40)
LDL Calculated: 79 mg/dL (ref 0–99)
Triglycerides: 94 mg/dL (ref 34–149)
VLDL Calculated: 19 mg/dL (ref 10–40)

## 2014-02-07 LAB — MAGNESIUM: Magnesium: 1.6 mg/dL (ref 1.6–2.6)

## 2014-02-07 LAB — FERRITIN: Ferritin: 14.46 ng/mL — ABNORMAL LOW (ref 21.80–274.70)

## 2014-02-07 LAB — FOLATE: Folate: 5.4 ng/mL

## 2014-02-07 LAB — HEMOLYSIS INDEX: Hemolysis Index: 0 (ref 0–18)

## 2014-02-07 MED ORDER — LORAZEPAM 2 MG/ML IJ SOLN
0.5000 mg | Freq: Once | INTRAMUSCULAR | Status: AC
Start: 2014-02-07 — End: 2014-02-07
  Administered 2014-02-07: 0.5 mg via INTRAVENOUS
  Filled 2014-02-07: qty 1

## 2014-02-07 MED ORDER — MORPHINE SULFATE 2 MG/ML IJ/IV SOLN (WRAP)
2.0000 mg | Status: DC | PRN
Start: 2014-02-07 — End: 2014-02-08
  Administered 2014-02-07: 2 mg via INTRAVENOUS
  Filled 2014-02-07: qty 1

## 2014-02-07 MED ORDER — POLYVINYL ALCOHOL 1.4 % OP SOLN
1.0000 [drp] | OPHTHALMIC | Status: DC | PRN
Start: 2014-02-07 — End: 2014-02-08
  Administered 2014-02-07: 1 [drp] via OPHTHALMIC
  Filled 2014-02-07 (×2): qty 15

## 2014-02-07 NOTE — Plan of Care (Signed)
Assessment complete,continues iv Protonix and iv antibiotic,no pain this time,will continues monitor.

## 2014-02-07 NOTE — Progress Notes (Signed)
[facility] Hospitalist Daily Progress Note        Date Time: 02/07/2014  11:34 PM  Patient Name:Chad Massey  AVW:[id number]  PCP: Alycia Patten, NP  Attending Physician:Elie Leppo [place] M.D.      Chief Complaint:      Chief Complaint   Patient presents with   . Abdominal Pain       Subjective:   Patient denied any nausea, vomiting, bowel movement and color has been normal.  Abdominal pain minimum complaint of some eye itching    Assessment/Plan     Active Diagnosis: Principal Problem:    Acute kidney injury  Active Problems:    Abdominal pain    CKD (chronic kidney disease), stage III    Hypertension    Metabolic acidosis, increased anion gap    Microcytic anemia    Elevated sedimentation rate    Duodenal ulcer    Microcytic anemia    Duodenal ulcer, acute    This is a 73 year old male with past medical history of hypertension, history of peptic ulcer with history of gastrointestinal bleed disease, gout, bilateral cataract chronic kidney disease stage III, diverticulosis of the colon, who recently had robotic laparoscopic cholecystectomy on January 10, 2014. Since that time he has persistent upper abdominal pain as well has abdominal bloating and distention. He has been taking 4-5 ibuprofens daily. Abdominal pelvic CT in the emergency department showed duodenal diverticulitis versus penetrating ulcer.     1. Abdominal pain likely secondary to peptic ulcer disease, gastrointestinal Dr. Sherryll Burger consult and recommend Protonix drip and antibiotics, cefepime as CAT scan also saying possible duodenal diverticuli. Possible plan for endoscopy tomorrow.  2. Acute renal failure on chronic kidney disease.  Improving to 1.8 He is baseline creatinine is 1.3-1.5. Patient have a recent nonsteroidal anti-inflammatory drug use. Improving hydration. Patient primary nephrologist is Dr. Cleophus Molt on board  3. Hypertension. Continue IV medication. Start clonidine patch  4. Dyslipidemia on  statin.  5. Normocytic anemia. Continue to monitor.  6. Metabolic acidosis likely secondary to acute renal failure, improving.  7. Hypoalbuminemia  8. History of gout on allopurinol and colchicine at home.  9.  Itching eye no redness or swelling lubricant drops  Disposition continue PPI drip and antibiotic. Nothing by mouth from midnight tonight and endoscopy tomorrow. Plan has been discussed with patient    DVT Prohylaxis:lovenox   Code Status: No Order   Disposition: home  Prognosis: Good  Type of Admission:Inpatient  Estimated Length of Stay (including stay in the ER receiving treatment): More than 2 minutes  Medical Necessity for stay: Peptic ulcer disease    Allergies:   No Known Allergies    Physical Exam:    height is 1.803 m (5' 10.98") and weight is 84.823 kg (187 lb). His temporal artery temperature is 99.1 F (37.3 C). His blood pressure is 155/71 and his pulse is 81. His respiration is 16 and oxygen saturation is 100%.   Body mass index is 26.09 kg/(m^2).  Filed Vitals:    02/07/14 0943 02/07/14 1227 02/07/14 1751 02/07/14 2136   BP: 182/86 158/72 148/74 155/71   Pulse: 87 87 100 81   Temp: 97.9 F (36.6 C)  98.8 F (37.1 C) 99.1 F (37.3 C)   TempSrc: Temporal Artery  Temporal Artery Temporal Artery   Resp: 18  18 16    Height:       Weight:       SpO2: 96%  97% 100%  Intake and Output Summary (Last 24 hours) at Date Time    Intake/Output Summary (Last 24 hours) at 02/07/14 2334  Last data filed at 02/07/14 1900   Gross per 24 hour   Intake   3656 ml   Output   1600 ml   Net   2056 ml       Constitutional: Patient is oriented to person, place, and time. Patient appears well-developed and well-nourished.   Head: Normocephalic and atraumatic.  Eyes- pupils equal and reactive,  sclera anicteric  Ears -  right ear normal, left ear normal  Nose - normal and patent  Mouth - mucous membranes moist, pharynx normal without lesions  Neck: Normal range of motion. Neck supple. No JVD present.    Cardiovascular: Normal rate, regular rhythm, normal heart sounds and intact distal pulses.  Exam reveals no gallop and no friction rub. No murmur heard.  Pulmonary/Chest: Effort normal and breath sounds normal. No stridor. No respiratory distress. Patient has no wheezes. No rales were present  Abdominal: Soft. Bowel sounds are normal. Patient exhibits no distension and no mass was palpable. There is no tenderness. There is no rebound and no guarding.   Musculoskeletal: Normal range of motion. Patient exhibits no edema and no tenderness.   Neurological: Patient is alert and oriented to person, place, and time and has normal reflexes. No cranial nerve deficit.  Normal muscle tone. Coordination normal.   Skin: Skin is warm. No rash noted. Patient is not diaphoretic. No erythema. No pallor.   Psychiatric: Has normal mood and affect. Behavior is normal. Judgment and thought content normal.    Consult Input/Plan     Plan  IP CONSULT TO GASTROENTEROLOGY  IP CONSULT TO NEPHROLOGY    Review of Systems:   A comprehensive review of systems has no changes since H&P was obtained except as mentioned in the subjective section.    Vitals 24 hrs:   Filed Vitals:    02/07/14 0943 02/07/14 1227 02/07/14 1751 02/07/14 2136   BP: 182/86 158/72 148/74 155/71   Pulse: 87 87 100 81   Temp: 97.9 F (36.6 C)  98.8 F (37.1 C) 99.1 F (37.3 C)   TempSrc: Temporal Artery  Temporal Artery Temporal Artery   Resp: 18  18 16    Height:       Weight:       SpO2: 96%  97% 100%        Readmission:   Admission on 01/09/2007, Discharged on 01/10/2007   Component Date Value Ref Range Status   . WBC 01/09/2007 12.1* 4.8 - 10.8 K/uL Final   . RBC 01/09/2007 3.78* 3.80 - 5.40 /CMM Final   . Hemoglobin 01/09/2007 11.2* 13.2 - 17.3 g/dL Final   . Hematocrit 60/73/7106 33.5* 39.0 - 49.0 % Final   . MCV 01/09/2007 88.5  80 - 100 fL Final   . MCH 01/09/2007 29.6  27.0 - 34.0 pg Final   . MCHC 01/09/2007 33.4  32.0 - 36.0 % Final   . Platelets 01/09/2007 255   150 - 400 K/uL Final   . RDW 01/09/2007 13.2  11.0 - 14.0 % Final   . Neutrophils Relative 01/09/2007 75.6* 49 - 69 % Final   . Lymphocytes Relative 01/09/2007 15.3* 25 - 55 % Final   . Monocytes Relative 01/09/2007 8.4* 1 - 8 % Final   . Eosinophils % 01/09/2007 0.3  0 - 6 % Final   . BASOPHILS % 01/09/2007 0.4  0 -  2 % Final   . Neutrophils Absolute 01/09/2007 9.1   Final   . Lymphocytes Absolute 01/09/2007 1.8   Final   . Monocytes Absolute 01/09/2007 1.0   Final   . Eosinophils Absolute 01/09/2007 0.0   Final   . Baso(Absolute) 01/09/2007 0.0   Final   . Color, UA 01/09/2007 YELLOW   Final   . Clarity, UA 01/09/2007 CLEAR   Final   . Specific Gravity, UR 01/09/2007 1.022  1.000 - 1.035 Final   . Leukocyte Esterase, UA 01/09/2007 NEGATIVE  NEGATIVE Final   . Nitrate 01/09/2007 NEGATIVE  NEGATIVE Final   . pH, Urine 01/09/2007 5.5  5.0 - 8.0 Final   . Protein, UR 01/09/2007 TRACE  NEGATIVE Final   . Glucose, UA 01/09/2007 NEGATIVE  NEGATIVE,N/A Final   . Ketones UA 01/09/2007 TRACE  NEGATIVE,N/A Final   . Urobilinogen, UA 01/09/2007 NORMAL  NORMAL Final   . Bilirubin, UA 01/09/2007 NEGATIVE  NEGATIVE Final   . Blood, UA 01/09/2007 NEGATIVE  NEGATIVE Final   . PT 01/09/2007 12.5  10.6 - 12.8 SEC Final   . PT INR 01/09/2007 1.1   Final    Comment: Therapeutic Range based on clinical condition                           2.0-3.0 Venous Thrombosis;Pulmonary Embolus;Peripheral Vascular                           Disease                           2.5-3.5 Mechanical Heart Valves   . WBC, UA 01/09/2007 2-5  NONE,0-2,2-5 /HPF Final   . RBC, UA 01/09/2007 0-2  NONE,0-2 /HPF Final   . Squam Epithel, UA 01/09/2007 0-2  NONE,0-2 /HPF Final   . Bacteria, UA 01/09/2007 FEW  NONE,FEW Final   . Mucus, UA 01/09/2007 TRACE  NONE Final   . Sperm, UA 01/09/2007 MODERATE  NONE Final   . ABO Rh 01/09/2007 A Positive   Final   . AB Screen Gel 01/09/2007 Negative   Final   . BUN 01/09/2007 47* 6 - 20 mg/dL Final   . Sodium 14/78/2956 140   135 - 145 mmol/L Final   . Potassium 01/09/2007 4.1  3.6 - 5.0 mmol/L Final   . Chloride 01/09/2007 104  101 - 111 mmol/L Final   . CO2 01/09/2007 25  21.0 - 31.0 mmol/L Final   . Glucose 01/09/2007 106* 70 - 100 mg/dL Final    Comment: Glucose Interpretation:                           A normal fasting glucose concentration is < 100 mg/dL.  An impaired                           fasting glucose concentration is 100-125 mg/dL.  A provisional diagnosis                           of diabetes mellitus can be made when a fasting glucose concentration is                           >  126 mg/dL (diagnosis must be confirmed).   . Creatinine 01/09/2007 1.80* 0.5 - 1.4 mg/dL Final   . BUN/Creatinine Ratio 01/09/2007 27* 8 - 20 Final   . CALCIUM 01/09/2007 10.7* 8.4 - 10.2 mg/dL Final   . Bilirubin, Total 01/09/2007 0.6  0.2 - 1.3 mg/dL Final   . Protein, Total 01/09/2007 6.7  6.3 - 8.2 g/dL Final   . Albumin, Synovial 01/09/2007 4.0  3.9 - 5.0 g/dL Final   . Alkaline Phosphatase 01/09/2007 62  38 - 126 U/L Final   . AST (SGOT) 01/09/2007 30  5 - 40 U/L Final   . ALT 01/09/2007 18  7 - 56 U/L Final   . EGFR 01/09/2007 41   Final    Comment: eGFR Interpretation:   Disease State Reference Ranges for eGFR                           (calculated)                           Stage   eGFR   Description                           I/II    >60    Normal/Mildly reduced kidney function                           III     30-59  Moderately reduced kidney function                           IV      15-29  Severely reduced kidney function                           V       <15    End-stage kidney failure                           Calculated using MDRD formula based on gender,race, and age.                           GFR estimates are unreliable in patients with rapidly changing kidney                           function, recent dialysis, extremes in body size, severe malnutrition or                           obesity, loss of limbs, or abnormal muscle  mass.  In these patients,                           alternative determinations of GFR should be obtained.   Marland Kitchen EGFR 01/09/2007 49   Final   . Hemoglobin 01/09/2007 9.4* 13.2 - 17.3 g/dL Final   . Hematocrit 16/06/9603 28.0* 39.0 - 49.0 % Final   . Hemoglobin 01/10/2007 8.8* 13.2 - 17.3 g/dL Final   . Hematocrit 54/05/8118 26.3* 39.0 - 49.0 % Final   . BUN 01/10/2007 33* 6 - 20 mg/dL Final   . Sodium 14/78/2956  139  135 - 145 mmol/L Final   . Potassium 01/10/2007 4.3  3.6 - 5.0 mmol/L Final   . Chloride 01/10/2007 108  101 - 111 mmol/L Final   . CO2 01/10/2007 25  21.0 - 31.0 mmol/L Final   . Glucose 01/10/2007 81  70 - 100 mg/dL Final    Comment: Glucose Interpretation:                           A normal fasting glucose concentration is < 100 mg/dL.  An impaired                           fasting glucose concentration is 100-125 mg/dL.  A provisional diagnosis                           of diabetes mellitus can be made when a fasting glucose concentration is                           >  126 mg/dL (diagnosis must be confirmed).   . Creatinine 01/10/2007 1.49* 0.5 - 1.4 mg/dL Final   . BUN/Creatinine Ratio 01/10/2007 22* 8 - 20 Final   . CALCIUM 01/10/2007 8.7  8.4 - 10.2 mg/dL Final   . EGFR 78/29/5621 51   Final    Comment: eGFR Interpretation:   Disease State Reference Ranges for eGFR                           (calculated)                           Stage   eGFR   Description                           I/II    >60    Normal/Mildly reduced kidney function                           III     30-59  Moderately reduced kidney function                           IV      15-29  Severely reduced kidney function                           V       <15    End-stage kidney failure                           Calculated using MDRD formula based on gender,race, and age.                           GFR estimates are unreliable in patients with rapidly changing kidney                           function, recent dialysis, extremes in body size,  severe malnutrition or  obesity, loss of limbs, or abnormal muscle mass.  In these patients,                           alternative determinations of GFR should be obtained.   Marland Kitchen EGFR 01/10/2007 >60   Final   . WBC 01/10/2007 7.9  4.8 - 10.8 K/uL Final   . RBC 01/10/2007 3.05* 3.80 - 5.40 /CMM Final   . Hemoglobin 01/10/2007 9.1* 13.2 - 17.3 g/dL Final   . Hematocrit 13/04/6577 26.9* 39.0 - 49.0 % Final   . MCV 01/10/2007 88.3  80 - 100 fL Final   . MCH 01/10/2007 29.8  27.0 - 34.0 pg Final   . MCHC 01/10/2007 33.7  32.0 - 36.0 % Final   . Platelets 01/10/2007 182  150 - 400 K/uL Final   . RDW 01/10/2007 13.4  11.0 - 14.0 % Final   . Neutrophils Relative 01/10/2007 65.8  49 - 69 % Final   . Lymphocytes Relative 01/10/2007 23.5* 25 - 55 % Final   . Monocytes Relative 01/10/2007 7.3  1 - 8 % Final   . Eosinophils % 01/10/2007 2.5  0 - 6 % Final   . BASOPHILS % 01/10/2007 0.9  0 - 2 % Final   . Neutrophils Absolute 01/10/2007 5.2   Final   . Lymphocytes Absolute 01/10/2007 1.9   Final   . Monocytes Absolute 01/10/2007 0.6   Final   . Eosinophils Absolute 01/10/2007 0.2   Final   . Baso(Absolute) 01/10/2007 0.1   Final   . PTT 01/09/2007 22.5  21.6 - 34.0 SEC Final    Comment: Therapeutic range for a standard Heparin Nomogram is 60-85 Seconds.                           Therapeutic range for Low Intensity Heparin Therapy is 50-75 Seconds.   Marland Kitchen PTT Ratio 01/09/2007 0.8  0.8 - 1.2 Final        Coagulation Profile: No results for input(s): PT, INR, PTT, APTT in the last 168 hours.       Medications:   Current Facility-Administered Medications   Medication Dose Route Frequency Last Rate Last Dose   . cefepime (MAXIPIME) 2 g in sodium chloride 0.9 % 100 mL IVPB mini-bag plus  2 g Intravenous Q24H SCH 200 mL/hr at 02/07/14 0929 2 g at 02/07/14 0929   . cloNIDine (CATAPRES-TTS-2) 0.2 MG/24HR 1 patch  1 patch Transdermal Weekly   1 patch at 02/06/14 2139   . hydrALAZINE (APRESOLINE) injection 10 mg  10 mg  Intravenous Q6H PRN   10 mg at 02/07/14 1009   . hydrALAZINE (APRESOLINE) injection 10 mg  10 mg Intravenous 4 times per day   10 mg at 02/07/14 1821   . morphine injection 2 mg  2 mg Intravenous Q4H PRN   2 mg at 02/07/14 1820   . ondansetron (ZOFRAN) injection 4 mg  4 mg Intravenous Q4H PRN       . pantoprazole (PROTONIX) 80 mg in sodium chloride 0.9 % 100 mL infusion  8 mg/hr Intravenous Continuous 10 mL/hr at 02/07/14 1604 8 mg/hr at 02/07/14 1604   . polyvinyl alcohol (LIQUIFILM TEARS) ophthalmic solution 1 drop  1 drop Both Eyes Q1H PRN   1 drop at 02/07/14 2207   . zolpidem (AMBIEN) tablet 10 mg  10 mg Oral QHS PRN   10 mg  at 02/07/14 0410        CBC review:   Recent Labs  Lab 02/07/14  0628 02/06/14  0757 02/05/14  2125   WBC 4.37 5.48 8.86   Hgb 8.9* 9.1* 11.0*   Hematocrit 29.3* 29.4* 34.9*   Platelets 218 231 334   MCV 94.2 94.2 93.3   RDW 13 13 13    Neutrophils  --   --  75   Lymphocytes Automated  --   --  14   Eosinophils Automated  --   --  1   Immature Granulocyte  --   --  1   Neutrophils Absolute  --   --  6.67   Absolute Immature Granulocyte  --   --  0.05        Chem Review:  Recent Labs  Lab 02/07/14  0628 02/06/14  0757 02/05/14  2125   Sodium 143 140 139   Potassium 4.1 3.7 4.4   Chloride 114* 110 106   CO2 20* 21* 16*   BUN 25.9 39.0* 48.5*   Creatinine 1.8* 2.4* 3.0*   Glucose 94 92 110*   CALCIUM 8.6 8.0 9.2   Magnesium 1.6 1.6  --    Phosphorus  --  3.7  --    Bilirubin, Total 0.4 0.4 0.3   AST (SGOT) 11 11 17    ALT 6 8 8    Alkaline Phosphatase 44 42 60        Labs:     Results    Procedure Component Value Units Date/Time    Folate [[id number]] Collected:  02/07/14 0628     Folate 5.4 ng/mL Updated:  02/07/14 1359    Ferritin [[id number]]  (Abnormal) Collected:  02/07/14 0628    Specimen Information:  Blood Updated:  02/07/14 1254     Ferritin 14.46 (L) ng/mL     Lipid panel [[id number]] Collected:  02/07/14 0628    Specimen Information:  Blood Updated:  02/07/14 1233     Cholesterol 141  mg/dL      Triglycerides 94 mg/dL      HDL 43 mg/dL      LDL Calculated 79 mg/dL      VLDL Cholesterol Cal 19 mg/dL      CHOL/HDL Ratio 3.3     IRON PROFILE [[id number]]  (Abnormal) Collected:  02/07/14 0628     Iron 31 (L) ug/dL Updated:  69/62/95 2841     UIBC 268 ug/dL      TIBC 324 ug/dL      Iron Saturation 10 (L) %     Hemolysis index [[id number]] Collected:  02/07/14 0628     Hemolysis Index 0 Updated:  02/07/14 1233    Comprehensive metabolic panel [[id number]]  (Abnormal) Collected:  02/07/14 0628    Specimen Information:  Blood Updated:  02/07/14 0719     Glucose 94 mg/dL      BUN 66.4 mg/dL      Creatinine 1.8 (H) mg/dL      Sodium 403 mEq/L      Potassium 4.1 mEq/L      Chloride 114 (H) mEq/L      CO2 20 (L) mEq/L      CALCIUM 8.6 mg/dL      Protein, Total 4.8 (L) g/dL      Albumin 2.6 (L) g/dL      AST (SGOT) 11 U/L      ALT 6 U/L      Alkaline Phosphatase 44 U/L  Bilirubin, Total 0.4 mg/dL      Globulin 2.2 g/dL      Albumin/Globulin Ratio 1.2      Anion Gap 9.0     GFR [[id number]] Collected:  02/07/14 0628     EGFR 37.4 Updated:  02/07/14 0719    Magnesium [[id number]] Collected:  02/07/14 0628    Specimen Information:  Blood Updated:  02/07/14 0719     Magnesium 1.6 mg/dL     CBC without differential [[id number]]  (Abnormal) Collected:  02/07/14 0628    Specimen Information:  Blood / Blood Updated:  02/07/14 0656     WBC 4.37 x10 3/uL      RBC 3.11 (L) x10 6/uL      Hgb 8.9 (L) g/dL      Hematocrit 29.5 (L) %      MCV 94.2 fL      MCH 28.6 pg      MCHC 30.4 (L) g/dL      RDW 13 %      Platelets 218 x10 3/uL      MPV 10.1 fL         Rads:   Radiological Procedure reviewed.  Radiology Results (24 Hour)    ** No results found for the last 24 hours. **          Time spent for evaluation, management and coordination of care:   :          Signed by: Campbell Dar Lezette Kitts  02/07/2014 11:34 PM

## 2014-02-07 NOTE — Progress Notes (Signed)
[facility], [employer].  Progress Note    Assessment:  1.Admitted with bleeding ulcer  2. Analgesic nephropathy  3. AKI  4. CKD IIIB   5. HTN  6. Gout  7. Dyslipidemia  8. Anemia    Plan:  1.IV fluid   2. NO NSAIDs  3. Check Fe stores  4. BP control    Herbie Drape, MD  Office [phone]  ++++++++++++++++++++++++++++++++++++++++++++++++++++++++++++++  Subjective:  No new complaints    Medications:  Scheduled Meds:  Current Facility-Administered Medications   Medication Dose Route Frequency   . cefepime  2 g Intravenous Q24H SCH   . cloNIDine  1 patch Transdermal Weekly   . hydrALAZINE  10 mg Intravenous 4 times per day     Continuous Infusions:  . sodium chloride 125 mL/hr at 02/07/14 0928   . pantoprozole (PROTONIX) infusion 8 mg/hr (02/07/14 0410)     PRN Meds:hydrALAZINE, HYDROmorphone, ondansetron, zolpidem    Objective:  Vital signs in last 24 hours:  Temp:  [97.9 F (36.6 C)-99.1 F (37.3 C)] 97.9 F (36.6 C)  Heart Rate:  [76-94] 87  Resp Rate:  [16-18] 18  BP: (123-182)/(64-86) 182/86 mmHg    Intake/Output from yesterday (07:01 - 07:00):  05/24 0701 - 05/25 0700  In: 2086.42 [I.V.:2006.42]  Out: 1500 [Urine:1500]     Physical Exam:   Gen: WD WN NAD   CV: S1 S2 N RRR   Chest: CTAB   Ab: ND NT soft no HSM +BS   Ext: No C/E    Labs:    Recent Labs  Lab 02/07/14  0628 02/06/14  0757 02/05/14  2125   Glucose 94 92 110*   BUN 25.9 39.0* 48.5*   Creatinine 1.8* 2.4* 3.0*   CALCIUM 8.6 8.0 9.2   Sodium 143 140 139   Potassium 4.1 3.7 4.4   Chloride 114* 110 106   CO2 20* 21* 16*   Albumin 2.6* 2.8* 3.6   Phosphorus  --  3.7  --    Magnesium 1.6 1.6  --        Recent Labs  Lab 02/07/14  0628 02/06/14  0757 02/05/14  2125   WBC 4.37 5.48 8.86   Hgb 8.9* 9.1* 11.0*   Hematocrit 29.3* 29.4* 34.9*   MCV 94.2 94.2 93.3   MCH 28.6 29.2 29.4   MCHC 30.4* 31.0* 31.5*   RDW 13 13 13    MPV 10.1 9.7 10.1   Platelets 218 231 334

## 2014-02-07 NOTE — Treatment Plan (Signed)
MD/RN Care Coordination Rounds    Date Time: 02/07/2014 5:50 AM  Patient Name: Chad Massey, Chad Massey  Room No: M272/M272-B  Admit Date: 02/05/2014  Length of Stay: 2  Attending Physician: Sunny Schlein, MD   Code Status:No Order  Admit Status: Inpatient    Admitting RN will complete this section on day of admission, followed by RN for each shift with updates with the date before each comment in appropriate section. Please do not delete the prior comments made by other RN's. RN will round with attending physician outside the room to address concerns and physician will round in the patients room with RN to explain the treatment plan to the patient.     RN Report Items Please Date Each Entry   1. Update Status - Any overnight events?  gave pt regular food last night. NPO again at midnight. He is hoping to discharge today.    2. Patient/family Goals/questions or concerns?          3. Abnormal Vitals?          4. Patient in pain? Is it well controlled?         5. Fluid and Food intake concerns?          6. Urine and Bowel habits concerns?          7. Issues Mental Status and ADLs?          8. Foley Catheter?          9. Central Line?    10. VTE prophylaxis?          11. Any abnormal labs? K, Mg, hb, Cr, LFT,?          12. RN concerns or questions?          13. Ask 3 Teach 3 done on new meds?    14. If no events past 24 hrs, please request Physician to discontinue telemetry monitor            HOSPITALIST will address any issues or concerns above with the RN before entering patients room and will update the on the following items.      Hospitalist Update Items Date/Initials-   1. Addressed Family/Pt/RN concerns or questions X   2. Informed Chief Complaint and Diagnosis  X   3. Informed Tests ordered/results and pending tests X   4. Informed New/Changed meds with Side Effects X   5. Informed Pain Control Plan X   6. Informed [place] Dispo/Criteria and Approx. days required for [place] X     Attending Physician: Sunny Schlein, MD    Date Time: 02/07/2014 5:50 AM    Vitals 24 hrs:   Filed Vitals:    02/06/14 1758 02/06/14 2138 02/06/14 2337 02/07/14 0116   BP: 158/73 158/75 157/69 123/64   Pulse: 79 83  94   Temp: 98.2 F (36.8 C) 98.1 F (36.7 C)  99.1 F (37.3 C)   TempSrc: Oral Temporal Artery  Temporal Artery   Resp: 16 16  16    Height:       Weight:       SpO2: 97% 98%  99%        Readmission:   Admission on 01/09/2007, Discharged on 01/10/2007   Component Date Value Ref Range Status   . WBC 01/09/2007 12.1* 4.8 - 10.8 K/uL Final   . RBC 01/09/2007 3.78* 3.80 - 5.40 /CMM Final   . Hemoglobin 01/09/2007 11.2* 13.2 - 17.3 g/dL Final   .  Hematocrit 01/09/2007 33.5* 39.0 - 49.0 % Final   . MCV 01/09/2007 88.5  80 - 100 fL Final   . MCH 01/09/2007 29.6  27.0 - 34.0 pg Final   . MCHC 01/09/2007 33.4  32.0 - 36.0 % Final   . Platelets 01/09/2007 255  150 - 400 K/uL Final   . RDW 01/09/2007 13.2  11.0 - 14.0 % Final   . Neutrophils Relative 01/09/2007 75.6* 49 - 69 % Final   . Lymphocytes Relative 01/09/2007 15.3* 25 - 55 % Final   . Monocytes Relative 01/09/2007 8.4* 1 - 8 % Final   . Eosinophils % 01/09/2007 0.3  0 - 6 % Final   . BASOPHILS % 01/09/2007 0.4  0 - 2 % Final   . Neutrophils Absolute 01/09/2007 9.1   Final   . Lymphocytes Absolute 01/09/2007 1.8   Final   . Monocytes Absolute 01/09/2007 1.0   Final   . Eosinophils Absolute 01/09/2007 0.0   Final   . Baso(Absolute) 01/09/2007 0.0   Final   . Color, UA 01/09/2007 YELLOW   Final   . Clarity, UA 01/09/2007 CLEAR   Final   . Specific Gravity, UR 01/09/2007 1.022  1.000 - 1.035 Final   . Leukocyte Esterase, UA 01/09/2007 NEGATIVE  NEGATIVE Final   . Nitrate 01/09/2007 NEGATIVE  NEGATIVE Final   . pH, Urine 01/09/2007 5.5  5.0 - 8.0 Final   . Protein, UR 01/09/2007 TRACE  NEGATIVE Final   . Glucose, UA 01/09/2007 NEGATIVE  NEGATIVE,N/A Final   . Ketones UA 01/09/2007 TRACE  NEGATIVE,N/A Final   . Urobilinogen, UA 01/09/2007 NORMAL  NORMAL Final   . Bilirubin, UA 01/09/2007 NEGATIVE   NEGATIVE Final   . Blood, UA 01/09/2007 NEGATIVE  NEGATIVE Final   . PT 01/09/2007 12.5  10.6 - 12.8 SEC Final   . PT INR 01/09/2007 1.1   Final    Comment: Therapeutic Range based on clinical condition                           2.0-3.0 Venous Thrombosis;Pulmonary Embolus;Peripheral Vascular                           Disease                           2.5-3.5 Mechanical Heart Valves   . WBC, UA 01/09/2007 2-5  NONE,0-2,2-5 /HPF Final   . RBC, UA 01/09/2007 0-2  NONE,0-2 /HPF Final   . Squam Epithel, UA 01/09/2007 0-2  NONE,0-2 /HPF Final   . Bacteria, UA 01/09/2007 FEW  NONE,FEW Final   . Mucus, UA 01/09/2007 TRACE  NONE Final   . Sperm, UA 01/09/2007 MODERATE  NONE Final   . ABO Rh 01/09/2007 A Positive   Final   . AB Screen Gel 01/09/2007 Negative   Final   . BUN 01/09/2007 47* 6 - 20 mg/dL Final   . Sodium 16/06/9603 140  135 - 145 mmol/L Final   . Potassium 01/09/2007 4.1  3.6 - 5.0 mmol/L Final   . Chloride 01/09/2007 104  101 - 111 mmol/L Final   . CO2 01/09/2007 25  21.0 - 31.0 mmol/L Final   . Glucose 01/09/2007 106* 70 - 100 mg/dL Final    Comment: Glucose Interpretation:  A normal fasting glucose concentration is < 100 mg/dL.  An impaired                           fasting glucose concentration is 100-125 mg/dL.  A provisional diagnosis                           of diabetes mellitus can be made when a fasting glucose concentration is                           >  126 mg/dL (diagnosis must be confirmed).   . Creatinine 01/09/2007 1.80* 0.5 - 1.4 mg/dL Final   . BUN/Creatinine Ratio 01/09/2007 27* 8 - 20 Final   . CALCIUM 01/09/2007 10.7* 8.4 - 10.2 mg/dL Final   . Bilirubin, Total 01/09/2007 0.6  0.2 - 1.3 mg/dL Final   . Protein, Total 01/09/2007 6.7  6.3 - 8.2 g/dL Final   . Albumin, Synovial 01/09/2007 4.0  3.9 - 5.0 g/dL Final   . Alkaline Phosphatase 01/09/2007 62  38 - 126 U/L Final   . AST (SGOT) 01/09/2007 30  5 - 40 U/L Final   . ALT 01/09/2007 18  7 - 56 U/L Final   . EGFR  01/09/2007 41   Final    Comment: eGFR Interpretation:   Disease State Reference Ranges for eGFR                           (calculated)                           Stage   eGFR   Description                           I/II    >60    Normal/Mildly reduced kidney function                           III     30-59  Moderately reduced kidney function                           IV      15-29  Severely reduced kidney function                           V       <15    End-stage kidney failure                           Calculated using MDRD formula based on gender,race, and age.                           GFR estimates are unreliable in patients with rapidly changing kidney                           function, recent dialysis, extremes in body size, severe malnutrition or  obesity, loss of limbs, or abnormal muscle mass.  In these patients,                           alternative determinations of GFR should be obtained.   Marland Kitchen EGFR 01/09/2007 49   Final   . Hemoglobin 01/09/2007 9.4* 13.2 - 17.3 g/dL Final   . Hematocrit 96/12/5407 28.0* 39.0 - 49.0 % Final   . Hemoglobin 01/10/2007 8.8* 13.2 - 17.3 g/dL Final   . Hematocrit 81/19/1478 26.3* 39.0 - 49.0 % Final   . BUN 01/10/2007 33* 6 - 20 mg/dL Final   . Sodium 29/56/2130 139  135 - 145 mmol/L Final   . Potassium 01/10/2007 4.3  3.6 - 5.0 mmol/L Final   . Chloride 01/10/2007 108  101 - 111 mmol/L Final   . CO2 01/10/2007 25  21.0 - 31.0 mmol/L Final   . Glucose 01/10/2007 81  70 - 100 mg/dL Final    Comment: Glucose Interpretation:                           A normal fasting glucose concentration is < 100 mg/dL.  An impaired                           fasting glucose concentration is 100-125 mg/dL.  A provisional diagnosis                           of diabetes mellitus can be made when a fasting glucose concentration is                           >  126 mg/dL (diagnosis must be confirmed).   . Creatinine 01/10/2007 1.49* 0.5 - 1.4 mg/dL Final   . BUN/Creatinine  Ratio 01/10/2007 22* 8 - 20 Final   . CALCIUM 01/10/2007 8.7  8.4 - 10.2 mg/dL Final   . EGFR 86/57/8469 51   Final    Comment: eGFR Interpretation:   Disease State Reference Ranges for eGFR                           (calculated)                           Stage   eGFR   Description                           I/II    >60    Normal/Mildly reduced kidney function                           III     30-59  Moderately reduced kidney function                           IV      15-29  Severely reduced kidney function                           V       <15    End-stage kidney failure  Calculated using MDRD formula based on gender,race, and age.                           GFR estimates are unreliable in patients with rapidly changing kidney                           function, recent dialysis, extremes in body size, severe malnutrition or                           obesity, loss of limbs, or abnormal muscle mass.  In these patients,                           alternative determinations of GFR should be obtained.   Marland Kitchen EGFR 01/10/2007 >60   Final   . WBC 01/10/2007 7.9  4.8 - 10.8 K/uL Final   . RBC 01/10/2007 3.05* 3.80 - 5.40 /CMM Final   . Hemoglobin 01/10/2007 9.1* 13.2 - 17.3 g/dL Final   . Hematocrit 29/56/2130 26.9* 39.0 - 49.0 % Final   . MCV 01/10/2007 88.3  80 - 100 fL Final   . MCH 01/10/2007 29.8  27.0 - 34.0 pg Final   . MCHC 01/10/2007 33.7  32.0 - 36.0 % Final   . Platelets 01/10/2007 182  150 - 400 K/uL Final   . RDW 01/10/2007 13.4  11.0 - 14.0 % Final   . Neutrophils Relative 01/10/2007 65.8  49 - 69 % Final   . Lymphocytes Relative 01/10/2007 23.5* 25 - 55 % Final   . Monocytes Relative 01/10/2007 7.3  1 - 8 % Final   . Eosinophils % 01/10/2007 2.5  0 - 6 % Final   . BASOPHILS % 01/10/2007 0.9  0 - 2 % Final   . Neutrophils Absolute 01/10/2007 5.2   Final   . Lymphocytes Absolute 01/10/2007 1.9   Final   . Monocytes Absolute 01/10/2007 0.6   Final   . Eosinophils Absolute 01/10/2007 0.2    Final   . Baso(Absolute) 01/10/2007 0.1   Final   . PTT 01/09/2007 22.5  21.6 - 34.0 SEC Final    Comment: Therapeutic range for a standard Heparin Nomogram is 60-85 Seconds.                           Therapeutic range for Low Intensity Heparin Therapy is 50-75 Seconds.   Marland Kitchen PTT Ratio 01/09/2007 0.8  0.8 - 1.2 Final        Coagulation Profile: No results for input(s): PT, INR, PTT, APTT in the last 168 hours.       Medications:   Current Facility-Administered Medications   Medication Dose Route Frequency Last Rate Last Dose   . 0.9%  NaCl infusion   Intravenous Continuous 125 mL/hr at 02/06/14 1753     . cefepime (MAXIPIME) 2 g in sodium chloride 0.9 % 100 mL IVPB mini-bag plus  2 g Intravenous Q24H SCH 200 mL/hr at 02/06/14 1002 2 g at 02/06/14 1002   . cloNIDine (CATAPRES-TTS-2) 0.2 MG/24HR 1 patch  1 patch Transdermal Weekly   1 patch at 02/06/14 2139   . enoxaparin (LOVENOX) syringe 30 mg  30 mg Subcutaneous QHS   30 mg at 02/06/14 1005   . hydrALAZINE (APRESOLINE) injection 10 mg  10  mg Intravenous Q6H PRN       . hydrALAZINE (APRESOLINE) injection 10 mg  10 mg Intravenous 4 times per day   10 mg at 02/06/14 2341   . HYDROmorphone (DILAUDID) injection 1 mg  1 mg Intravenous Q2H PRN       . ondansetron (ZOFRAN) injection 4 mg  4 mg Intravenous Q4H PRN       . pantoprazole (PROTONIX) 80 mg in sodium chloride 0.9 % 100 mL infusion  8 mg/hr Intravenous Continuous 10 mL/hr at 02/07/14 0410 8 mg/hr at 02/07/14 0410   . zolpidem (AMBIEN) tablet 10 mg  10 mg Oral QHS PRN   10 mg at 02/07/14 0410        CBC review:   Recent Labs  Lab 02/06/14  0757 02/05/14  2125   WBC 5.48 8.86   Hgb 9.1* 11.0*   Hematocrit 29.4* 34.9*   Platelets 231 334   MCV 94.2 93.3   RDW 13 13   Neutrophils  --  75   Lymphocytes Automated  --  14   Eosinophils Automated  --  1   Immature Granulocyte  --  1   Neutrophils Absolute  --  6.67   Absolute Immature Granulocyte  --  0.05        Chem Review:  Recent Labs  Lab 02/06/14  0757 02/05/14  2125    Sodium 140 139   Potassium 3.7 4.4   Chloride 110 106   CO2 21* 16*   BUN 39.0* 48.5*   Creatinine 2.4* 3.0*   Glucose 92 110*   CALCIUM 8.0 9.2   Magnesium 1.6  --    Phosphorus 3.7  --    Bilirubin, Total 0.4 0.3   AST (SGOT) 11 17   ALT 8 8   Alkaline Phosphatase 42 60

## 2014-02-07 NOTE — Progress Notes (Signed)
GASTROENTEROLOGY PROGRESS NOTE    Date Time: 02/07/2014 11:03 AM  Patient Name: Chad Massey, Chad Massey        Chief Complaint:     Abdominal pain, abnormal ct        Subjective:        No pain today, no bleeding    Medications:     Current Facility-Administered Medications   Medication Dose Route Frequency   . cefepime  2 g Intravenous Q24H SCH   . cloNIDine  1 patch Transdermal Weekly   . enoxaparin  30 mg Subcutaneous QHS   . hydrALAZINE  10 mg Intravenous 4 times per day       Review of Systems:     Heent: no h/a  Lungs: no sob  Heart: no chest pain  Abd:as above  Physical Exam:     Filed Vitals:    02/07/14 0943   BP: 182/86   Pulse: 87   Temp: 97.9 F (36.6 C)   Resp: 18   SpO2: 96%       General appearance - alert, well appearing, and in no distress  Mental status - alert, oriented to person, place, and time  Eyes - pupils equal and reactive, extraocular eye movements intact  Mouth - mucous membranes moist, pharynx normal without lesions  Chest - clear to auscultation, no wheezes, rales or rhonchi, symmetric air entry  Heart - normal rate, regular rhythm, normal S1, S2, no murmurs, rubs, clicks or gallops  Abdomen - soft, nontender, nondistended, no masses or organomegaly  Rectal - negative without mass, lesions or tenderness  Neurological - alert, oriented, normal speech, no focal findings or movement disorder noted  Extremities - peripheral pulses normal, no pedal edema, no clubbing or cyanosis  Skin - normal coloration and turgor, no rashes, no suspicious skin lesions noted    Labs:     Results    Procedure Component Value Units Date/Time    Comprehensive metabolic panel [[id number]]  (Abnormal) Collected:  02/07/14 0628    Specimen Information:  Blood Updated:  02/07/14 0719     Glucose 94 mg/dL      BUN 27.0 mg/dL      Creatinine 1.8 (H) mg/dL      Sodium 623 mEq/L      Potassium 4.1 mEq/L      Chloride 114 (H) mEq/L      CO2 20 (L) mEq/L      CALCIUM 8.6 mg/dL      Protein, Total 4.8 (L) g/dL      Albumin 2.6  (L) g/dL      AST (SGOT) 11 U/L      ALT 6 U/L      Alkaline Phosphatase 44 U/L      Bilirubin, Total 0.4 mg/dL      Globulin 2.2 g/dL      Albumin/Globulin Ratio 1.2      Anion Gap 9.0     GFR [[id number]] Collected:  02/07/14 0628     EGFR 37.4 Updated:  02/07/14 0719    Magnesium [[id number]] Collected:  02/07/14 0628    Specimen Information:  Blood Updated:  02/07/14 0719     Magnesium 1.6 mg/dL     CBC without differential [[id number]]  (Abnormal) Collected:  02/07/14 0628    Specimen Information:  Blood / Blood Updated:  02/07/14 0656     WBC 4.37 x10 3/uL      RBC 3.11 (L) x10 6/uL      Hgb 8.9 (L) g/dL  Hematocrit 29.3 (L) %      MCV 94.2 fL      MCH 28.6 pg      MCHC 30.4 (L) g/dL      RDW 13 %      Platelets 218 x10 3/uL      MPV 10.1 fL     Lipid panel [[id number]] Collected:  02/07/14 0628    Specimen Information:  Blood Updated:  02/07/14 0629    Narrative:      Fasting specimen    Ferritin [[id number]] Collected:  02/07/14 0628    Specimen Information:  Blood Updated:  02/07/14 0629    IRON PROFILE [[id number]] Collected:  02/07/14 0628     Updated:  02/07/14 0629    H. pylori Antibody, IgG [[id number]] Collected:  02/06/14 0756    Specimen Information:  Blood Updated:  02/06/14 2054     Helicobacter Pylori Antibody Negative           Radiology:   Radiological Procedure reviewed.      Endoscopy:     none    Pathology:       Assessment:     1.  Pain, abnormal CT: stable.  No pain today.  Has been on abx and Iv protonix for 48 hours.  Exam benign.  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  Pt has no wbc elevation and no fever.  So diverticulitis unlikely.  Serum h. Pylori ab is negative.  So if it is an ulcer, it is due to his nsaid use.     Plan:     -liquids today  -npo post mn  -continue Protonix and abx  -EGD in am.  This was explained to pt.

## 2014-02-08 ENCOUNTER — Inpatient Hospital Stay: Payer: Medicare Other

## 2014-02-08 ENCOUNTER — Encounter: Payer: Medicare Other | Admitting: Anesthesiology

## 2014-02-08 ENCOUNTER — Inpatient Hospital Stay: Payer: Medicare Other | Admitting: Anesthesiology

## 2014-02-08 ENCOUNTER — Encounter: Payer: Self-pay | Admitting: Internal Medicine

## 2014-02-08 ENCOUNTER — Encounter
Admission: EM | Disposition: A | Discharge status: Home / Self Care | Payer: Self-pay | Source: Home / Self Care | Attending: Internal Medicine

## 2014-02-08 DIAGNOSIS — N140 Analgesic nephropathy: Secondary | ICD-10-CM

## 2014-02-08 DIAGNOSIS — E8729 Other acidosis: Secondary | ICD-10-CM

## 2014-02-08 HISTORY — DX: Other acidosis: E87.29

## 2014-02-08 HISTORY — DX: Analgesic nephropathy: N14.0

## 2014-02-08 LAB — COMPREHENSIVE METABOLIC PANEL
ALT: 8 U/L (ref 0–55)
AST (SGOT): 12 U/L (ref 5–34)
Albumin/Globulin Ratio: 1.3 (ref 0.9–2.2)
Albumin: 2.7 g/dL — ABNORMAL LOW (ref 3.5–5.0)
Alkaline Phosphatase: 46 U/L (ref 38–106)
Anion Gap: 8 (ref 5.0–15.0)
BUN: 18.2 mg/dL (ref 9.0–28.0)
Bilirubin, Total: 0.4 mg/dL (ref 0.2–1.2)
CO2: 20 mEq/L — ABNORMAL LOW (ref 22–29)
Calcium: 8.8 mg/dL (ref 7.9–10.2)
Chloride: 113 mEq/L — ABNORMAL HIGH (ref 100–111)
Creatinine: 1.6 mg/dL — ABNORMAL HIGH (ref 0.7–1.3)
Globulin: 2.1 g/dL (ref 2.0–3.6)
Glucose: 96 mg/dL (ref 70–100)
Potassium: 4 mEq/L (ref 3.5–5.1)
Protein, Total: 4.8 g/dL — ABNORMAL LOW (ref 6.0–8.3)
Sodium: 141 mEq/L (ref 136–145)

## 2014-02-08 LAB — CBC
Hematocrit: 29.2 % — ABNORMAL LOW (ref 42.0–52.0)
Hgb: 9.1 g/dL — ABNORMAL LOW (ref 13.0–17.0)
MCH: 29.3 pg (ref 28.0–32.0)
MCHC: 31.2 g/dL — ABNORMAL LOW (ref 32.0–36.0)
MCV: 93.9 fL (ref 80.0–100.0)
MPV: 10 fL (ref 9.4–12.3)
Platelets: 249 10*3/uL (ref 140–400)
RBC: 3.11 10*6/uL — ABNORMAL LOW (ref 4.70–6.00)
RDW: 13 % (ref 12–15)
WBC: 4.74 10*3/uL (ref 3.50–10.80)

## 2014-02-08 LAB — GFR: EGFR: 42.9

## 2014-02-08 SURGERY — DONT USE, USE 1095-ESOPHAGOGASTRODUODENOSCOPY (EGD), DIAGNOSTIC
Anesthesia: Anesthesia General | Site: Mouth | Wound class: Clean Contaminated

## 2014-02-08 MED ORDER — LIDOCAINE HCL 2 % IJ SOLN
INTRAMUSCULAR | Status: DC | PRN
Start: 2014-02-08 — End: 2014-02-08
  Administered 2014-02-08: 100 mg

## 2014-02-08 MED ORDER — LIDOCAINE HCL (PF) 2 % IJ SOLN
INTRAMUSCULAR | Status: AC
Start: 2014-02-08 — End: ?
  Filled 2014-02-08: qty 5

## 2014-02-08 MED ORDER — PROPOFOL 10 MG/ML IV EMUL
INTRAVENOUS | Status: DC | PRN
Start: 2014-02-08 — End: 2014-02-08
  Administered 2014-02-08 (×2): 40 mg via INTRAVENOUS
  Administered 2014-02-08 (×6): 30 mg via INTRAVENOUS
  Administered 2014-02-08: 20 mg via INTRAVENOUS

## 2014-02-08 MED ORDER — PANTOPRAZOLE SODIUM 40 MG PO TBEC
40.0000 mg | DELAYED_RELEASE_TABLET | Freq: Two times a day (BID) | ORAL | Status: AC
Start: 2014-02-08 — End: ?

## 2014-02-08 MED ORDER — PROPOFOL 10 MG/ML IV EMUL
INTRAVENOUS | Status: AC
Start: 2014-02-08 — End: ?
  Filled 2014-02-08: qty 40

## 2014-02-08 MED ORDER — LACTATED RINGERS IV SOLN
1000.0000 mL | INTRAVENOUS | Status: DC
Start: 2014-02-08 — End: 2014-02-08
  Administered 2014-02-08: 1000 mL via INTRAVENOUS

## 2014-02-08 MED ORDER — ZOLPIDEM TARTRATE 10 MG PO TABS
10.0000 mg | ORAL_TABLET | Freq: Every evening | ORAL | Status: AC | PRN
Start: 2014-02-08 — End: ?

## 2014-02-08 SURGICAL SUPPLY — 45 items
BASIN EME PLS 700ML LF GRAD TRNLU PGMNT (Patient Supply) ×2
BASIN EMESIS 700 ML GRADUATED TRANSLUCENT PIGMENT FREE PLASTIC (Patient Supply) ×1 IMPLANT
CATHETER BALLOON DILATATION CRE PEBAX (Balloon) IMPLANT
CATHETER OD6 FR ODSEC12-13.5-15 MM L180 CM CREâ„¢ BALLOON DILATATION L8 (Balloon) IMPLANT
CLIP QUICKCLIP2 GI FIXATN LONG (Clip) IMPLANT
CONTAINER HISTOLOGY 60 ML 30 ML GRADUATE LEAK RESISTANT O RING PREFILL (Procedure Accessories) IMPLANT
DILATOR ESCP PEBAX CRE 6FR 12-13.5-15MM (Balloon) IMPLANT
FORCEPS BIOPSY L240 CM +2.8 MM HOT OD2.2 (Endoscopic Supplies)
FORCEPS BIOPSY L240 CM +2.8 MM HOT OD2.2 MM RADIAL JAW (Endoscopic Supplies) IMPLANT
FORCEPS BIOPSY L240 CM JUMBO MICROMESH (Instrument)
FORCEPS BIOPSY L240 CM JUMBO MICROMESH TEETH STREAMLINE CATHETER (Instrument) IMPLANT
FORCEPS BIOPSY L240 CM LARGE CAPACITY (Instrument)
FORCEPS BIOPSY L240 CM MICROMESH TEETH STREAMLINE CATHETER NEEDLE (Instrument) ×1 IMPLANT
FORCEPS BIOPSY L240 CM STANDARD CAPACITY (Instrument) ×1
FORCEPS BX +2.8MM RJ 4 2.2MM 240CM HOT (Endoscopic Supplies)
FORCEPS BX SS JMB RJ 4 2.8MM 240CM STRL (Instrument)
FORCEPS BX SS LG CPC RJ 4 2.4MM 240CM (Instrument)
FORCEPS BX STD CPC RJ 4 2.2MM 240CM STRL (Instrument) ×1
FORCEPS RADIAL JAW 3 W/ NEEDLE (Endoscopic Supplies) IMPLANT
GOWN ISO YELLOW UNIVERSAL (Gown) ×4 IMPLANT
KIT SMARTPREP APC 30 30ML (Kits) IMPLANT
LINER SCT 1500CC THNWL MDVC FLX ADV LF (Suction) IMPLANT
MARKER ENDOSCOPIC PERMANENT INDICATION (Syringes, Needles)
MARKER ENDOSCOPIC PERMANENT INDICATION DARK SYRINGE SPOT EX 5 ML (Syringes, Needles) IMPLANT
MARKER ESCP 5ML SPOT EX PERM INDCT DRK (Syringes, Needles)
NEEDLE INJC DISP NM LOWER GI (Needles) IMPLANT
NET SPEC RTRVL STD RTHNT 2.5MM 230CM LF (Urology Supply)
NET SPECIMEN RETRIEVAL L230 CM STANDARD (Urology Supply)
NET SPECIMEN RETRIEVAL L230 CM STANDARD SHEATH OD2.5 MM L6 CM X W3 CM (Urology Supply) IMPLANT
PAD ELECTROSRG GRND REM W CRD (Procedure Accessories) IMPLANT
SNARE ESCP MED OVL CPTFLX 2.4MM 240CM (Endoscopic Supplies)
SNARE ESCP MIC CPTVTR 13MM 240IN STRL (GE Lab Supplies)
SNARE MD OVAL 240CM 2.4 MM CPTFLX LP FLXBL ENDOSCOPIC POLYPECTOMY 27MM (Endoscopic Supplies) IMPLANT
SNARE MEDIUM OVAL L240 CM OD2.4 MM (Endoscopic Supplies)
SNARE SMALL HEXAGON CAPTIVATOR STIFF ENDOSCOPIC POLYPECTOMY (GE Lab Supplies) IMPLANT
SOL FORMALIN 10% PREFILL 30ML (Procedure Accessories)
SOLN LUBRICATING JELLY 4.25OZ (Irrigation Solutions) ×2 IMPLANT
SPEEDBAND SUPERVIEW SUPER (Endoscopic Supplies) IMPLANT
SPONGE GAUZE L4 IN X W4 IN 4 PLY HIGH (Sponge) ×1
SPONGE GAUZE L4 IN X W4 IN 4 PLY NONWOVEN LINT FREE CURITY RAYON (Sponge) ×1 IMPLANT
SPONGE GZE RYN PLSTR CRTY 4X4IN LF NS 4 (Sponge) ×1
SYRINGE 50 ML GRADUATE NONPYROGENIC DEHP (Syringes, Needles)
SYRINGE 50 ML GRADUATE NONPYROGENIC DEHP FREE PVC FREE BD MEDICAL (Syringes, Needles) IMPLANT
SYRINGE MED 50ML LF STRL GRAD N-PYRG (Syringes, Needles)
TRAP SPECIMEN LF (Procedure Accessories) IMPLANT

## 2014-02-08 NOTE — Progress Notes (Signed)
Patient received from PACU in stable condition, patient is awake, alert and oriented, dnies pain, no nausea or vomiting,  started with clear liquid and will advance to bland diet.  call bell within reach.

## 2014-02-08 NOTE — Discharge Instructions (Signed)
Patient Education  · Pantoprazole Sodium Gastro-resistant tablet  · Pantoprazole Sodium Oral suspension  · Pantoprazole Sodium Solution for injection  Pantoprazole Sodium Gastro-resistant tablet  What is this medicine?  PANTOPRAZOLE (pan TOE pra zole) prevents the production of acid in the stomach. It is used to treat gastroesophageal reflux disease (GERD), inflammation of the esophagus, and Zollinger-Ellison syndrome.  This medicine may be used for other purposes; ask your health care provider or pharmacist if you have questions.  What should I tell my health care provider before I take this medicine?  They need to know if you have any of these conditions:  · liver disease  · low levels of magnesium in the blood  · an unusual or allergic reaction to omeprazole, lansoprazole, pantoprazole, rabeprazole, other medicines, foods, dyes, or preservatives  · pregnant or trying to get pregnant  · breast-feeding  How should I use this medicine?  Take this medicine by mouth. Swallow the tablets whole with a drink of water. Follow the directions on the prescription label. Do not crush, break, or chew. Take your medicine at regular intervals. Do not take your medicine more often than directed.  Talk to your pediatrician regarding the use of this medicine in children. While this drug may be prescribed for children as young as 5 years for selected conditions, precautions do apply.  Overdosage: If you think you have taken too much of this medicine contact a poison control center or emergency room at once.  NOTE: This medicine is only for you. Do not share this medicine with others.  What if I miss a dose?  If you miss a dose, take it as soon as you can. If it is almost time for your next dose, take only that dose. Do not take double or extra doses.  What may interact with this medicine?  Do not take this medicine with any of the following medications:  · atazanavir  · nelfinavir  This medicine may also interact with the  following medications:  · ampicillin  · delavirdine  · digoxin  · diuretics  · iron salts  · medicines for fungal infections like ketoconazole, itraconazole and voriconazole  · warfarin  This list may not describe all possible interactions. Give your health care provider a list of all the medicines, herbs, non-prescription drugs, or dietary supplements you use. Also tell them if you smoke, drink alcohol, or use illegal drugs. Some items may interact with your medicine.  What should I watch for while using this medicine?  It can take several days before your stomach pain gets better. Check with your doctor or health care professional if your condition does not start to get better, or if it gets worse.  You may need blood work done while you are taking this medicine.  What side effects may I notice from receiving this medicine?  Side effects that you should report to your doctor or health care professional as soon as possible:  · allergic reactions like skin rash, itching or hives, swelling of the face, lips, or tongue  · bone, muscle or joint pain  · breathing problems  · chest pain or chest tightness  · dark yellow or brown urine  · dizziness  · fast, irregular heartbeat  · feeling faint or lightheaded  · fever or sore throat  · muscle spasm  · palpitations  · redness, blistering, peeling or loosening of the skin, including inside the mouth  · seizures  · tremors  · unusual   unusual bleeding or bruising   unusually weak or tired   yellowing of the eyes or skin  Side effects that usually do not require medical attention (Report these to your doctor or health care professional if they continue or are bothersome.):   constipation   diarrhea   dry mouth   headache   nausea  This list may not describe all possible side effects. Call your doctor for medical advice about side effects. You may report side effects to FDA at 1-800-FDA-1088.  Where should I keep my medicine?  Keep out of the reach of children.  Store at room  temperature between 15 and 30 degrees C (59 and 86 degrees F). Protect from light and moisture. Throw away any unused medicine after the expiration date.  NOTE:This sheet is a summary. It may not cover all possible information. If you have questions about this medicine, talk to your doctor, pharmacist, or health care provider. Copyright 2014 [employer]    DISCHARGE INSTRUCTION: Activity as tolerated  Diet: Bland, soft diet  Please return if symptom worsen in any way  Please avoid Aspirin, Motrin, Ibuprofen, Aleve, Advil, Indomethacin for risk of worsening the stomach ulcer and bleed

## 2014-02-08 NOTE — Plan of Care (Signed)
Problem: Safety  Goal: Patient will be free from injury during hospitalization  Outcome: Progressing  Hourly rounding  Call bell within reach  Provide and maintain safe environment  Involve  paient  and family in care  Assess fall risk and update on white board   Keep door open and stay near room     Problem: Pain  Goal: Patient's pain/discomfort is manageable  Outcome: Progressing  Educate about PRN pain medication  Provide medication in timely fashion   Involve pateint and family in decision related to pain management

## 2014-02-08 NOTE — Anesthesia Postprocedure Evaluation (Signed)
At the conclusion of the procedure, the patient was uneventfully transported to the PACU in stable condition with good ventilatory exchange. Uneventful transition to PACU care.    At this time the patient is awake/easily arousable. The patient's respirations, and cardiovascular status have been evaluated and deemed stable. Post op nausea, vomiting and pain are being evaluated, treated and controlled as effectively as possible without compromising the patients respiratory and cardiovascular status.    Review of input and output information, assessment of cardiovascular course and current physical findings are consistent with adequate hydrational support. Please refer to PACU nursing documentation for confirmation of attainment of normothermia.    There were no anesthetic-related complications evident at this time.    The patient is recovering well and a smooth transition to the next phase of care is anticipated.

## 2014-02-08 NOTE — Progress Notes (Signed)
Pre-op report given to  [facility] in endo.

## 2014-02-08 NOTE — Discharge Summary (Signed)
[facility] Hospitalist Discharge Note      Date Time: 02/08/2014  7:12 PM  Patient Name:Chad Massey  ZOX:[id number]  PCP: Alycia Patten, NP  Attending Physician:Tawonda Legaspi Carmel [place] M.D.    Hospital Course:   Please see H&P for complete details of HPI and ROS. The patient was admitted to [facility] and has been diagnosed with the following conditions and has been taken care as mentioned below.    Patient Active Problem List    Diagnosis Date Noted   . Hyperchloremic metabolic acidosis 02/08/2014   . 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 Abdominal pain 02/06/2014   . Elevated sedimentation rate 02/06/2014   . IDA (iron deficiency anemia) 02/06/2014   . CKD (chronic kidney disease), stage III    . Hypertension    . Duodenal ulcer, acute    . Acute kidney injury 02/05/2014       Type of Admission: Inpatient   Medical Necessity for stay: Peptic ulcer disease    Date of Admission:   02/05/2014    Date of Discharge:   02/08/2014    Chief Complaint:      Chief Complaint   Patient presents with   . Abdominal Pain       Discharge Diagnosis:   Hospital Problems:  1.  Abdominal pain due to  Peptic ulcer disease  2.  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  3.  Right hand swelling, negative for deep vein thrombosis on Doppler  4.  Dyslipidemia.  5.  Hypertension.  6.  Normocytic anemia.  7.  Metabolic acidosis secondary to acute renal failure on CK D, improving.  7.  Hypoalbuminemia.  8.  History of gout.  9.  Diverticulosis of the colon.  10.  Recent laparoscopic robotic cholecystectomy    This is a 73 year old male with past medical history of hypertension, history of peptic ulcer with history of gastrointestinal bleed disease, gout, bilateral cataract chronic kidney disease stage III, diverticulosis of the colon, who recently had robotic laparoscopic cholecystectomy on January 10, 2014. Since that time he has persistent upper abdominal pain as well has abdominal bloating  and distention. He has been taking 4-5 ibuprofens daily. Abdominal pelvic CT in the emergency department showed duodenal diverticulitis versus penetrating ulcer.     1. Abdominal pain likely secondary to peptic ulcer disease, gastrointestinal Dr. Sherryll Burger consult and recommend Protonix drip and antibiotics, cefepime as CAT scan also saying possible duodenal diverticuli. Possible plan for endoscopy tomorrow.  2. Acute renal failure on chronic kidney disease. Improving to 1.8 He is baseline creatinine is 1.3-1.5. Patient have a recent nonsteroidal anti-inflammatory drug use. Improving hydration. Patient primary nephrologist is Dr. Cleophus Molt on board  3. Hypertension. Continue IV medication. Start clonidine patch  4. Dyslipidemia on statin.  5. Normocytic anemia. Continue to monitor.  6. Metabolic acidosis likely secondary to acute renal failure, improving.  7. Hypoalbuminemia  8. History of gout on allopurinol and colchicine at home.  9. Itching eye no redness or swelling lubricant drops, resolved    Disposition patient presenting symptoms improve.  Vital stable and he will be discharged today.    Review of system  Gen. no fever or chills.  Respiratory no cough, sputum, shortness of breath  Cardiovascular no chest pain, palpitation, orthopnea.  Gastrointestinal no nausea, vomiting, abdominal pain, diarrhea.  Nervous system mild headache.  Positive no blurry vision, dizziness, focal weakness or numbness.  Genitourinary no dysuria, urgency, frequency  Muscular musculoskeletal right hand swelling     Consult Input/Plan     Plan  IP CONSULT TO GASTROENTEROLOGY  IP CONSULT TO NEPHROLOGY    Procedures performed:   EGD done by Dr. Marnette Burgess, on Feb 08, 2014 showed 1 cm ulcer in the antrum of the stomach, large ulcer on duodenal bulb, but Barrett's esophagus, hiatal hernia,     Physical Exam:    height is 1.803 m (5' 10.98") and weight is 84.823 kg (187 lb). His temporal artery temperature is 97.5 F (36.4 C). His blood pressure is  186/83 and his pulse is 74. His respiration is 18 and oxygen saturation is 99%.   Body mass index is 26.09 kg/(m^2).  Filed Vitals:    02/08/14 1331 02/08/14 1446 02/08/14 1519 02/08/14 1631   BP: 173/85 134/68 158/79 186/83   Pulse: 86 78 78 74   Temp: 99.5 F (37.5 C) 98.2 F (36.8 C) 97.3 F (36.3 C) 97.5 F (36.4 C)   TempSrc: Temporal Artery Temporal Artery Temporal Artery Temporal Artery   Resp: 18 20 18 18    Height:       Weight:       SpO2: 100% 99% 96% 99%    on examination  Gen. comfortable lying in bed.  Respiratory normal breath sounds normal respiratory effort.  Cardiovascular 1st and 2nd heart sound, no murmur.  No pedal edema.  Abdomen soft, nontender, no distended, no hepatosplenomegaly.  Nervous system focal deficit noted  Musculoskeletal right hand swelling, improving.  No sign of inflammation  Intake and Output Summary (Last 24 hours) at Date Time    Intake/Output Summary (Last 24 hours) at 02/08/14 1912  Last data filed at 02/08/14 1441   Gross per 24 hour   Intake  528.1 ml   Output    400 ml   Net  128.1 ml         Labs:     Results    Procedure Component Value Units Date/Time    GFR [[id number]] Collected:  02/08/14 0607     EGFR 42.9 Updated:  02/08/14 0726    Comprehensive metabolic panel [[id number]]  (Abnormal) Collected:  02/08/14 0607    Specimen Information:  Blood Updated:  02/08/14 0726     Glucose 96 mg/dL      BUN 76.2 mg/dL      Creatinine 1.6 (H) mg/dL      Sodium 831 mEq/L      Potassium 4.0 mEq/L      Chloride 113 (H) mEq/L      CO2 20 (L) mEq/L      CALCIUM 8.8 mg/dL      Protein, Total 4.8 (L) g/dL      Albumin 2.7 (L) g/dL      AST (SGOT) 12 U/L      ALT 8 U/L      Alkaline Phosphatase 46 U/L      Bilirubin, Total 0.4 mg/dL      Globulin 2.1 g/dL      Albumin/Globulin Ratio 1.3      Anion Gap 8.0     CBC without differential [[id number]]  (Abnormal) Collected:  02/08/14 0607    Specimen Information:  Blood / Blood Updated:  02/08/14 0707     WBC 4.74 x10 3/uL      RBC 3.11  (L) x10 6/uL      Hgb 9.1 (L) g/dL      Hematocrit 71.0 (L) %      MCV 93.9 fL  MCH 29.3 pg      MCHC 31.2 (L) g/dL      RDW 13 %      Platelets 249 x10 3/uL      MPV 10.0 fL             Rads:   Radiological Procedure reviewed.  US Venous Duplex Doppler Arm Right    02/08/2014   Swelling. IV right arm.  Findings no prior.  The right upper extremity venous system was evaluated using high resolution gray scale imaging, color Doppler, and spectral waveform analysis.  The deep and superficial systems are normal without evidence of thrombosis.  The subclavian, axillary, and jugular veins are included in the exam and are normally patent.  The contralateral internal jugular vein and subclavian vein are normally patent.     02/08/2014    No evidence of deep or superficial venous thrombosis, right upper extremity.  Marty Heck, MD  02/08/2014 6:21 PM     Ct Abdomen Pelvis W Po Contrast Only    02/05/2014   HISTORY:  Right upper quadrant pain. Status post robotic laparoscopic cholecystectomy.  COMPARISON:  None  TECHNIQUE: Contiguous axial images were obtained using a collimation of 5 mm with 2.5 mm axial reconstructions from the lung bases to the pubic symphysis.  IV contrast was not administered. Oral contrast was given. Coronal and sagittal reformats were obtained.  FINDINGS:  Immediately distal to the duodenal bulb, there is wall thickening and prominent inflammation surrounding the second portion of the duodenum. There is contrast that extends medially into either a diverticulum or penetrating ulcer. There is no extravasation of contrast. No free air is identified. There is no high-grade obstruction at this level, and contrast is present within more distal small bowel loops.  There is a 4.6 cm cyst within the right hepatic lobe. Multiple calcified granulomas are seen within the spleen. There is perinephric soft tissue stranding bilaterally with suggestion of mild scarring involving the right kidney. There is no  hydronephrosis. Ductal dilatation is observed although this may be secondary to cholecystectomy. The unenhanced pancreas and adrenal glands are grossly normal. Calcified plaque is seen along portions of the abdominal aorta and iliac arteries which are normal in caliber.  Multiple diverticula are seen along the distal portion of the colon without evidence of diverticulitis. The appendix is normal. Remainder of the small bowel loops are unremarkable. No free fluid is identified. Urinary bladder is normal. 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  There is mild dependent atelectasis at the right base and minimal subsegmental atelectasis at the left base.     02/05/2014      1. Duodenal diverticulitis versus penetrating ulcer. 2. Likely medical renal disease with mild scarring on the right. 3. Status post cholecystectomy. 4. Colonic diverticulosis without evidence of diverticulitis.  Gerlene Burdock, MD  02/05/2014 11:36 PM       Discharge Medications:     Please See Discharge Medication reconciliation for the final list of medications.    Pending Labs:   none  Discharge Destination:   home  Condition at Discharge :   Improved    Time spent for Discharge Care:      Follow-up:   Dr. Hortense Ramal, Gastroenterology office in 2 weeks time  Recommended Follow up with PCP in one week.    Signed by: [facility] Elder Love, MD

## 2014-02-08 NOTE — Anesthesia Preprocedure Evaluation (Signed)
Anesthesia Evaluation    AIRWAY    Mallampati: II    TM distance: >3 FB  Neck ROM: full  Mouth Opening:full   CARDIOVASCULAR    cardiovascular exam normal       DENTAL    no notable dental hx     PULMONARY    pulmonary exam normal     OTHER FINDINGS                      Anesthesia Plan    ASA 3     general                     intravenous induction   Detailed anesthesia plan: general IV        Post op pain management: per surgeon    informed consent obtained

## 2014-02-08 NOTE — H&P (Signed)
GI PRE PROCEDURE NOTE    Proceduralist Comments:   Review of Systems and Past Medical / Surgical History performed: Yes     Indications:Abdominal pain    Previous Adverse Reaction to Anesthesia or Sedation (if yes, describe): No    Physical Exam / Laboratory Data (If applicable)   Airway Classification: Class II    General: Alert and cooperative  Lungs: Lungs clear to auscultation  Cardiac: RRR, normal S1S2.    Abdomen: Soft, non tender. Normal active bowel sounds  Other:     Recent PT/PTT No results for input(s): INR, PTT in the last 24 hours.    Invalid input(s): PTI, COUM, COUMP, ACOAG, ACOAP  Recent PT/INR No results for input(s): INR in the last 24 hours.    Invalid input(s): PTI, COUM, COUMP    Planned Sedation:   Deep sedation with anesthesia    Attestation:   Sh[place] has been reassessed immediately prior to the procedure and is an appropriate candidate for the planned sedation and procedure. Risks, benefits and alternatives to the planned procedure and sedation have been explained to the patient or guardian:  yes    Signed by: Jameca Chumley Billey Chang

## 2014-02-08 NOTE — Progress Notes (Signed)
Attempted to see pt and he is out of the room for a procedure. CM will revisit in a.m.

## 2014-02-08 NOTE — Plan of Care (Signed)
Problem: Safety  Goal: Patient will be free from injury during hospitalization  Outcome: Progressing  Hourly rounding  Call bell within reach  Provide and maintain safe environment  Involve  paient  and family in care    Assess fall risk and update on white board   Keep door open and stay near room         Problem: Pain  Goal: Patient's pain/discomfort is manageable  Outcome: Progressing  Educate about PRN pain medication  Provide medication in timely fashion   Involve pateint and family in decision related to pain management  Provide non pharmacological intervention such as   Ice/ cold therapy

## 2014-02-08 NOTE — Plan of Care (Signed)
Problem: Health Promotion  Goal: Knowledge - disease process  Extent of understanding conveyed about a specific disease process.   Outcome: Progressing  Care plan reviewed. Questions encouraged and addressed. IVF at [facility]. Protonix infusing. NCT on. Pt informed of NPO status at [facility] and he verbalized understanding.    Problem: Safety  Goal: Patient will be free from injury during hospitalization  Outcome: Progressing  Rounding Q2h. Bed in lowest position. Call bell within reach. Pt encouraged to call for assistance with ambulation.    Problem: Pain  Goal: Patient's pain/discomfort is manageable  Outcome: Progressing  Pain rating scale reviewed. Pt encouraged to call for PRN pain meds.

## 2014-02-08 NOTE — Transfer of Care (Signed)
Anesthesia Transfer of Care Note    Patient: Chad Massey    Procedures performed: Procedure(s):  EGD w/ bx    Anesthesia type: General TIVA    Patient location:Phase I PACU    Last vitals:   Filed Vitals:    02/08/14 1446   BP: 134/68   Pulse: 78   Temp: 36.8 C (98.2 F)   Resp: 20   SpO2: 99%       Post pain: Patient not complaining of pain, continue current therapy      Mental Status:awake and alert     Respiratory Function: tolerating room air    Cardiovascular: stable    Nausea/Vomiting: patient not complaining of nausea or vomiting    Hydration Status: adequate    Post assessment: no apparent anesthetic complications, no reportable events and no evidence of recall

## 2014-02-10 LAB — LAB USE ONLY - HISTORICAL SURGICAL PATHOLOGY

## 2018-12-16 DIAGNOSIS — E785 Hyperlipidemia, unspecified: Secondary | ICD-10-CM

## 2018-12-16 DIAGNOSIS — N184 Chronic kidney disease, stage 4 (severe): Secondary | ICD-10-CM | POA: Insufficient documentation

## 2018-12-16 HISTORY — DX: Chronic kidney disease, stage 4 (severe): N18.4

## 2018-12-16 HISTORY — DX: Hyperlipidemia, unspecified: E78.5

## 2021-04-03 ENCOUNTER — Ambulatory Visit: Payer: Self-pay | Admitting: Nurse Practitioner

## 2021-04-22 ENCOUNTER — Observation Stay
Admission: EM | Admit: 2021-04-22 | Discharge: 2021-04-23 | Disposition: A | Discharge status: Home / Self Care | Payer: Federal, State, Local not specified - PPO | Attending: Hospitalist | Admitting: Hospitalist

## 2021-04-22 ENCOUNTER — Other Ambulatory Visit: Payer: Self-pay

## 2021-04-22 ENCOUNTER — Emergency Department: Payer: Federal, State, Local not specified - PPO

## 2021-04-22 DIAGNOSIS — M79641 Pain in right hand: Secondary | ICD-10-CM | POA: Diagnosis present

## 2021-04-22 DIAGNOSIS — F0390 Unspecified dementia without behavioral disturbance: Secondary | ICD-10-CM | POA: Insufficient documentation

## 2021-04-22 DIAGNOSIS — M7989 Other specified soft tissue disorders: Secondary | ICD-10-CM

## 2021-04-22 DIAGNOSIS — M109 Gout, unspecified: Secondary | ICD-10-CM

## 2021-04-22 DIAGNOSIS — N184 Chronic kidney disease, stage 4 (severe): Secondary | ICD-10-CM | POA: Diagnosis not present

## 2021-04-22 DIAGNOSIS — Z79899 Other long term (current) drug therapy: Secondary | ICD-10-CM | POA: Insufficient documentation

## 2021-04-22 DIAGNOSIS — Z7982 Long term (current) use of aspirin: Secondary | ICD-10-CM | POA: Diagnosis not present

## 2021-04-22 DIAGNOSIS — I129 Hypertensive chronic kidney disease with stage 1 through stage 4 chronic kidney disease, or unspecified chronic kidney disease: Secondary | ICD-10-CM | POA: Insufficient documentation

## 2021-04-22 DIAGNOSIS — L0390 Cellulitis, unspecified: Secondary | ICD-10-CM | POA: Diagnosis present

## 2021-04-22 DIAGNOSIS — Z20822 Contact with and (suspected) exposure to covid-19: Secondary | ICD-10-CM | POA: Insufficient documentation

## 2021-04-22 HISTORY — DX: Essential (primary) hypertension: I10

## 2021-04-22 HISTORY — DX: Unspecified dementia, unspecified severity, without behavioral disturbance, psychotic disturbance, mood disturbance, and anxiety: F03.90

## 2021-04-22 HISTORY — DX: Disorder of kidney and ureter, unspecified: N28.9

## 2021-04-22 HISTORY — DX: Cellulitis, unspecified: L03.90

## 2021-04-22 HISTORY — DX: Gout, unspecified: M10.9

## 2021-04-22 LAB — COMPREHENSIVE METABOLIC PANEL
ALT: 41 U/L (ref 0–44)
AST: 186 U/L — ABNORMAL HIGH (ref 15–41)
Albumin: 3.4 g/dL — ABNORMAL LOW (ref 3.5–5.0)
Alkaline Phosphatase: 79 U/L (ref 38–126)
Anion gap: 12 (ref 5–15)
BUN: 45 mg/dL — ABNORMAL HIGH (ref 8–23)
CO2: 23 mmol/L (ref 22–32)
Calcium: 9.5 mg/dL (ref 8.9–10.3)
Chloride: 103 mmol/L (ref 98–111)
Creatinine, Ser: 2.7 mg/dL — ABNORMAL HIGH (ref 0.61–1.24)
GFR, Estimated: 24 mL/min — ABNORMAL LOW (ref >60)
Glucose, Bld: 116 mg/dL — ABNORMAL HIGH (ref 70–99)
Potassium: 3.7 mmol/L (ref 3.5–5.1)
Sodium: 138 mmol/L (ref 135–145)
Total Bilirubin: 1 mg/dL (ref 0.3–1.2)
Total Protein: 7.3 g/dL (ref 6.5–8.1)

## 2021-04-22 LAB — LACTIC ACID, PLASMA
Lactic Acid, Venous: 2.2 mmol/L (ref 0.5–1.9)
Lactic Acid, Venous: 1.7 mmol/L (ref 0.5–1.9)

## 2021-04-22 LAB — CBC
HCT: 37.9 % — ABNORMAL LOW (ref 39.0–52.0)
Hemoglobin: 12.3 g/dL — ABNORMAL LOW (ref 13.0–17.0)
MCH: 30.5 pg (ref 26.0–34.0)
MCHC: 32.5 g/dL (ref 30.0–36.0)
MCV: 94 fL (ref 80.0–100.0)
Platelets: 259 10*3/uL (ref 150–400)
RBC: 4.03 MIL/uL — ABNORMAL LOW (ref 4.22–5.81)
RDW: 13.4 % (ref 11.5–15.5)
WBC: 12.5 10*3/uL — ABNORMAL HIGH (ref 4.0–10.5)
nRBC: 0 % (ref 0.0–0.2)

## 2021-04-22 LAB — URIC ACID: Uric Acid, Serum: 9 mg/dL — ABNORMAL HIGH (ref 3.7–8.6)

## 2021-04-22 MED ORDER — FOLIC ACID 1 MG PO TABS
1.0000 mg | ORAL_TABLET | Freq: Every day | ORAL | Status: DC
  Administered 2021-04-22 – 2021-04-23 (×2): 1 mg via ORAL
  Filled 2021-04-22 (×2): qty 1

## 2021-04-22 MED ORDER — LACTATED RINGERS IV SOLN: INTRAVENOUS | Status: AC

## 2021-04-22 MED ORDER — HYDRALAZINE HCL 20 MG/ML IJ SOLN: 10.0000 mg | Freq: Four times a day (QID) | INTRAMUSCULAR | Status: DC | PRN

## 2021-04-22 MED ORDER — COLCHICINE 0.6 MG PO TABS
0.3000 mg | ORAL_TABLET | Freq: Every day | ORAL | Status: DC
  Administered 2021-04-22 – 2021-04-23 (×2): 0.3 mg via ORAL
  Filled 2021-04-22 (×3): qty 1
  Filled 2021-04-22: qty 0.5

## 2021-04-22 MED ORDER — ASPIRIN EC 81 MG PO TBEC
81.0000 mg | DELAYED_RELEASE_TABLET | Freq: Every day | ORAL | Status: DC
  Administered 2021-04-22 – 2021-04-23 (×2): 81 mg via ORAL
  Filled 2021-04-22 (×2): qty 1

## 2021-04-22 MED ORDER — POLYETHYLENE GLYCOL 3350 17 G PO PACK: 17.0000 g | PACK | Freq: Every day | ORAL | Status: DC | PRN

## 2021-04-22 MED ORDER — CEFAZOLIN SODIUM-DEXTROSE 2-4 GM/100ML-% IV SOLN
2.0000 g | Freq: Two times a day (BID) | INTRAVENOUS | Status: DC
  Administered 2021-04-23: 2 g via INTRAVENOUS
  Filled 2021-04-22: qty 100

## 2021-04-22 MED ORDER — HEPARIN SODIUM (PORCINE) 5000 UNIT/ML IJ SOLN
5000.0000 [IU] | Freq: Three times a day (TID) | INTRAMUSCULAR | Status: DC
  Administered 2021-04-22 – 2021-04-23 (×4): 5000 [IU] via SUBCUTANEOUS
  Filled 2021-04-22 (×4): qty 1

## 2021-04-22 MED ORDER — ALLOPURINOL 100 MG PO TABS
50.0000 mg | ORAL_TABLET | Freq: Every day | ORAL | Status: DC
  Administered 2021-04-22 – 2021-04-23 (×2): 50 mg via ORAL
  Filled 2021-04-22 (×2): qty 0.5

## 2021-04-22 MED ORDER — PRAVASTATIN SODIUM 20 MG PO TABS
40.0000 mg | ORAL_TABLET | Freq: Every day | ORAL | Status: DC
  Administered 2021-04-22 – 2021-04-23 (×2): 40 mg via ORAL
  Filled 2021-04-22 (×2): qty 2

## 2021-04-22 MED ORDER — BACLOFEN 10 MG PO TABS: 10.0000 mg | ORAL_TABLET | Freq: Every day | ORAL | Status: DC

## 2021-04-22 MED ORDER — PREDNISONE 20 MG PO TABS
60.0000 mg | ORAL_TABLET | Freq: Once | ORAL | Status: AC
  Administered 2021-04-22: 60 mg via ORAL
  Filled 2021-04-22: qty 3

## 2021-04-22 MED ORDER — PANTOPRAZOLE SODIUM 20 MG PO TBEC
20.0000 mg | DELAYED_RELEASE_TABLET | Freq: Every day | ORAL | Status: DC
  Administered 2021-04-22 – 2021-04-23 (×2): 20 mg via ORAL
  Filled 2021-04-22 (×2): qty 1

## 2021-04-22 MED ORDER — OXYCODONE HCL 5 MG PO TABS: 5.0000 mg | ORAL_TABLET | ORAL | Status: DC | PRN

## 2021-04-22 MED ORDER — ACETAMINOPHEN 325 MG PO TABS: 650.0000 mg | ORAL_TABLET | Freq: Four times a day (QID) | ORAL | Status: DC | PRN

## 2021-04-22 MED ORDER — ONDANSETRON HCL 4 MG PO TABS: 4.0000 mg | ORAL_TABLET | Freq: Four times a day (QID) | ORAL | Status: DC | PRN

## 2021-04-22 MED ORDER — VANCOMYCIN HCL IN DEXTROSE 1-5 GM/200ML-% IV SOLN
1000.0000 mg | Freq: Once | INTRAVENOUS | Status: AC
  Administered 2021-04-22: 1000 mg via INTRAVENOUS
  Filled 2021-04-22: qty 200

## 2021-04-22 MED ORDER — METHYLPREDNISOLONE SODIUM SUCC 40 MG IJ SOLR
40.0000 mg | Freq: Two times a day (BID) | INTRAMUSCULAR | Status: DC
  Administered 2021-04-22 – 2021-04-23 (×2): 40 mg via INTRAVENOUS
  Filled 2021-04-22 (×2): qty 1

## 2021-04-22 MED ORDER — MORPHINE SULFATE (PF) 2 MG/ML IV SOLN: 2.0000 mg | INTRAVENOUS | Status: DC | PRN

## 2021-04-22 MED ORDER — ONDANSETRON HCL 4 MG/2ML IJ SOLN: 4.0000 mg | Freq: Four times a day (QID) | INTRAMUSCULAR | Status: DC | PRN

## 2021-04-22 MED ORDER — ZOLPIDEM TARTRATE 5 MG PO TABS: 5.0000 mg | ORAL_TABLET | Freq: Every evening | ORAL | Status: DC | PRN

## 2021-04-22 MED ORDER — ACETAMINOPHEN 650 MG RE SUPP: 650.0000 mg | Freq: Four times a day (QID) | RECTAL | Status: DC | PRN

## 2021-04-22 MED ORDER — VERAPAMIL HCL ER 240 MG PO TBCR
240.0000 mg | EXTENDED_RELEASE_TABLET | Freq: Every day | ORAL | Status: DC
  Administered 2021-04-22 – 2021-04-23 (×2): 240 mg via ORAL
  Filled 2021-04-22 (×2): qty 1

## 2021-04-22 MED ORDER — SODIUM CHLORIDE 0.9 % IV SOLN
1.0000 g | Freq: Once | INTRAVENOUS | Status: AC
  Administered 2021-04-22: 1 g via INTRAVENOUS
  Filled 2021-04-22: qty 10

## 2021-04-22 NOTE — ED Notes (Signed)
Pt reports swelling in the right hand that started on Thursday and had continued to get worse. Pt denies recent injury or insect bite. Pt denies pain at this time but states that it was hurting yesterday. Pt is in NAD.

## 2021-04-22 NOTE — ED Provider Notes (Signed)
[facility] Emergency Department Provider Note   ____________________________________________   Event Date/Time   First MD Initiated Contact with Patient 04/22/21 1027     (approximate)  I have reviewed the triage vital signs and the nursing notes.   HISTORY  Chief Complaint Hand Pain    HPI Jason Estrada is a 80 y.o. male patient brought in by wife.  He developed right hand pain and swelling Thursday which got worse and went up into the wrist.  Pain is moderately severe.  It is achy.  He has a history of gout but has never had gout in his hand before just his feet.  He is currently unable to use his walker so is fallen several times.  He cannot get off the toilet by himself and his wife cannot take care of him at home the way he is now.  He has not had a fever.         Past Medical History:  Diagnosis Date   Dementia ([place])    Hypertension    Renal disorder     There are no problems to display for this patient.   Past Surgical History:  Procedure Laterality Date   CHOLECYSTECTOMY      Prior to Admission medications   Not on File    Allergies Patient has no known allergies.  History reviewed. No pertinent family history.  Social History Social History   Tobacco Use   Smoking status: Never   Smokeless tobacco: Never  Substance Use Topics   Alcohol use: Not Currently   Drug use: Not Currently    Review of Systems  Constitutional: No fever/chills Eyes: No visual changes. ENT: No sore throat. Cardiovascular: Denies chest pain. Respiratory: Denies shortness of breath. Gastrointestinal: No abdominal pain.  No nausea, no vomiting.  No diarrhea.  No constipation. Genitourinary: Negative for dysuria. Musculoskeletal: Patient has some mild achiness in his back. Skin: Negative for rash. Neurological: Negative for headaches, focal weakness  ____________________________________________   PHYSICAL EXAM:  VITAL SIGNS: ED Triage  Vitals  Enc Vitals Group     BP 04/22/21 0921 138/80     Pulse Rate 04/22/21 0921 70     Resp 04/22/21 0921 18     Temp 04/22/21 0921 98.2 F (36.8 C)     Temp Source 04/22/21 0921 Oral     SpO2 04/22/21 0921 98 %     Weight 04/22/21 0922 150 lb (68 kg)     Height 04/22/21 0922 6' (1.829 m)     Head Circumference --      Peak Flow --      Pain Score 04/22/21 0922 4     Pain Loc --      Pain Edu? --      Excl. in [place]? --    Constitutional: Alert and oriented. Well appearing and in no acute distress. Eyes: Conjunctivae are normal. PERRL. EOMI. Head: Atraumatic. Nose: No congestion/rhinnorhea. Mouth/Throat: Mucous membranes are moist.  Oropharynx non-erythematous. Neck: No stridor.  Cardiovascular: Normal rate, regular rhythm. Grossly normal heart sounds.  Good peripheral circulation. Respiratory: Normal respiratory effort.  No retractions. Lungs CTAB. Gastrointestinal: Soft and nontender. No distention. No abdominal bruits. No CVA tenderness. Musculoskeletal: No lower extremity tenderness nor edema.  Right hand and wrist is puffy erythematous and warm. Neurologic:  Normal speech and language. No new gross focal neurologic deficits are appreciated.  Skin:  Skin is warm, dry and intact except for some healing abrasions on the right  knee and the above-noted changes in the right hand. No rash noted.  ____________________________________________   LABS (all labs ordered are listed, but only abnormal results are displayed)  Labs Reviewed  CBC - Abnormal; Notable for the following components:      Result Value   WBC 12.5 (*)    RBC 4.03 (*)    Hemoglobin 12.3 (*)    HCT 37.9 (*)    All other components within normal limits  COMPREHENSIVE METABOLIC PANEL - Abnormal; Notable for the following components:   Glucose, Bld 116 (*)    BUN 45 (*)    Creatinine, Ser 2.70 (*)    Albumin 3.4 (*)    AST 186 (*)    GFR, Estimated 24 (*)    All other components within normal limits   LACTIC ACID, PLASMA - Abnormal; Notable for the following components:   Lactic Acid, Venous 2.2 (*)    All other components within normal limits  URIC ACID - Abnormal; Notable for the following components:   Uric Acid, Serum 9.0 (*)    All other components within normal limits  LACTIC ACID, PLASMA   ____________________________________________  EKG   ____________________________________________  RADIOLOGY Gertha Calkin, personally viewed and evaluated these images (plain radiographs) as part of my medical decision making, as well as reviewing the written report by the radiologist.  ED MD interpretation: Hand x-rays did not show any acute fractures or some fairly severe degenerative joint disease at the base of the thumb and in the MCP joint of the thumb  Official radiology report(s): No results found.  ____________________________________________   PROCEDURES  Procedure(s) performed (including Critical Care):  Procedures   ____________________________________________   INITIAL IMPRESSION / ASSESSMENT AND PLAN / ED COURSE Differential diagnosis of this patient includes infection and gout.  There is no obvious sign of trauma although this could be a possibility as well.  Labs are not helpful at this point as the white count and lactic acid are both elevated as is the uric acid.  Patient has not had gout in the hand before.  I will treat him with antibiotics and steroids hopefully this will help.  The patient cannot have nonsteroidals as he has marginal renal function and he is had the problem for 4 days now so colchicine is not likely to work either.  It would be the best thing at his age either.  I will consult with the hospitalist as well.  He will need at least respite care until he can walk again after he regains use of his hand.            ____________________________________________   FINAL CLINICAL IMPRESSION(S) / ED DIAGNOSES  Final diagnoses:  Right  hand pain  Swelling of right hand     ED Discharge Orders          Ordered    SARS Coronavirus 2 (TAT 6-24 hrs)        04/22/21 1148             Note:  This document was prepared using Dragon voice recognition software and may include unintentional dictation errors.    Nena Polio, MD 04/22/21 1150

## 2021-04-22 NOTE — Consult Note (Signed)
Pharmacy Antibiotic Note  Jason Estrada is a 80 y.o. male admitted on 04/22/2021 with cellulitis.    Pharmacy has been consulted for Cefazolin dosing.  Scr 2.7 with an est CrCL of 50ml/min with unclear baseline.  Plan: Will start Cefazolin 2g q12h and monitor renal function daily  Height: 6' (182.9 cm) Weight: 68 kg (150 lb) IBW/kg (Calculated) : 77.6  Temp (24hrs), Avg:98.1 F (36.7 C), Min:98 F (36.7 C), Max:98.2 F (36.8 C)  Recent Labs  Lab 04/22/21 0924 04/22/21 1129  WBC 12.5*  --   CREATININE 2.70*  --   LATICACIDVEN 2.2* 1.7    Estimated Creatinine Clearance: 22 mL/min (A) (by C-G formula based on SCr of 2.7 mg/dL (H)).    No Known Allergies  Antimicrobials this admission: Ceftriaxone/Vancomycin 8/7 x 1 Cefazolin 8/8 >>  Dose adjustments this admission: None  Microbiology results: None  Thank you for allowing pharmacy to be a part of this patient's care.  Lu Duffel, PharmD, BCPS Clinical Pharmacist 04/22/2021 2:03 PM

## 2021-04-22 NOTE — ED Triage Notes (Addendum)
Pt comes pov with hand swelling. Pt unsure of why he is here (dementia) so wife answering questions. Pt right hand has significant redness and swelling. Hx of gout and takes medication for the same. Pt uses walker at home and because of hand swelling has had increased trouble getting around the house. Has had falls because of this trouble. Denies LOC or hitting head or other injuries.

## 2021-04-22 NOTE — ED Notes (Signed)
covid swab sent

## 2021-04-22 NOTE — ED Notes (Signed)
Hospitalist at bedside 

## 2021-04-22 NOTE — H&P (Signed)
History and Physical    Bary Limbach TZG:[id number] DOB: 07-06-1941 DOA: 04/22/2021  PCP: Pcp, No  Chief Complaint: Right hand pain and swelling  HPI: Jason Estrada is a 80 y.o. male with a past medical history of gout, dementia, hypertension, chronic kidney disease unspecified stage.  The patient presents to the emergency department due to right hand and right wrist swelling that started Thursday and has gotten worse since then.  The pain is moderately severe.  He has pretty significant swelling as well.  Has a history of gout but it has never affected his hands.  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  He is currently unable to use his walker because of his right hand pain.  He is fallen several times.  He cannot get himself off the toilet and his wife cannot take care of him at home since this has been going on.  Denies hitting his head or loss of consciousness.  He has no fevers or chills.  He states taking aspirin has somewhat helped his pain.  No nausea, vomiting, diarrhea.  No chest pain.  He has recently moved from [place].  Has not seen a doctor in the last 6 months.  He is establishing with a primary care physician with upcoming appointment on Friday, August 12 in [place], [place].  He does not recall his recent renal markers at baseline.  He is on allopurinol and colchicine at baseline and states he has been compliant with these medications.   ED Course: Ceftriaxone 1 g IV x1, prednisone 60 mg p.o. x1, vancomycin 1 g IV x1; right hand x-ray; lactic acid, uric acid, CMP, CBC.  Review of Systems: 14 point review of systems is negative except for what is mentioned above in the HPI.   Past Medical History:  Diagnosis Date   Dementia ([place])    Hypertension    Renal disorder     Past Surgical History:  Procedure Laterality Date   CHOLECYSTECTOMY      Social History   Socioeconomic History   Marital status: Married    Spouse name: Not on  file   Number of children: Not on file   Years of education: Not on file   Highest education level: Not on file  Occupational History   Not on file  Tobacco Use   Smoking status: Never   Smokeless tobacco: Never  Substance and Sexual Activity   Alcohol use: Not Currently   Drug use: Not Currently   Sexual activity: Not Currently  Other Topics Concern   Not on file  Social History Narrative   Not on file   Social Determinants of Health   Financial Resource Strain: Not on file  Food Insecurity: Not on file  Transportation Needs: Not on file  Physical Activity: Not on file  Stress: Not on file  Social Connections: Not on file  Intimate Partner Violence: Not on file    No Known Allergies  History reviewed. No pertinent family history.  Prior to Admission medications   Medication Sig Start Date End Date Taking? Authorizing Provider  allopurinol (ZYLOPRIM) 100 MG tablet Take 100 mg by mouth daily. 03/06/21  Yes [provider]  aspirin EC 81 MG tablet Take 81 mg by mouth daily. Swallow whole.   Yes [provider]  baclofen (LIORESAL) 10 MG tablet Take 10 mg by mouth daily. 03/05/21  Yes [provider]  colchicine 0.6 MG tablet Take 0.6 mg by mouth  every evening.   Yes [provider]  folic acid (FOLVITE) 1 MG tablet Take 1 mg by mouth daily. 03/05/21  Yes [provider]  hydrALAZINE (APRESOLINE) 50 MG tablet Take 50 mg by mouth daily as needed. 03/05/21  Yes [provider]  pantoprazole (PROTONIX) 20 MG tablet Take 20 mg by mouth daily. 03/05/21  Yes [provider]  pravastatin (PRAVACHOL) 40 MG tablet Take 40 mg by mouth daily. 03/05/21  Yes [provider]  valsartan-hydrochlorothiazide (DIOVAN-HCT) 320-25 MG tablet Take 1 tablet by mouth daily.   Yes [provider]  verapamil (CALAN-SR) 240 MG CR tablet Take 240 mg by mouth daily. 03/05/21  Yes [provider]  zolpidem (AMBIEN) 10 MG  tablet Take 10 mg by mouth at bedtime as needed for sleep. 02/08/14  Yes [provider]    Physical Exam: Vitals:   04/22/21 1200 04/22/21 1230 04/22/21 1333 04/22/21 1606  BP: 128/62 134/71 125/71 122/66  Pulse: 75 68 71 73  Resp: 18 16 18 15   Temp:  98 F (36.7 C)  97.6 F (36.4 C)  TempSrc:      SpO2: 98% 97% 99% 99%  Weight:      Height:         General:  Appears calm and comfortable and is in NAD Cardiovascular:  RRR, no m/r/g.  Respiratory:   CTA bilaterally with no wheezes/rales/rhonchi.  Normal respiratory effort. Abdomen:  soft, NT, ND, NABS Skin: Moderate to severe swelling of the right hand and right wrist with moderate to severe tenderness to palpation.  Tophi noted on bilateral elbows. Musculoskeletal:  grossly normal tone BUE/BLE, good ROM, no bony abnormality Lower extremity:  No LE edema.  Limited foot exam with no ulcerations.  2+ distal pulses. Psychiatric:  grossly normal mood and affect, speech fluent and appropriate, AOx3 Neurologic:  CN 2-12 grossly intact, moves all extremities in coordinated fashion, sensation intact    Radiological Exams on Admission: Independently reviewed - see discussion in A/P where applicable  DG Hand Complete Right  Result Date: 04/22/2021 CLINICAL DATA:  Acute RIGHT hand pain and swelling. Reported history of gout. EXAM: RIGHT HAND - COMPLETE 3+ VIEW COMPARISON:  None. FINDINGS: No acute fracture or dislocation noted. An erosion with sclerotic edges in the 2nd metatarsal head is noted, suggestive of gout. Probable erosions within the 3rd metatarsal head and along the interphalangeal joints of the 2nd and 3rd fingers are noted. Periarticular calcifications along the 3rd and 4th MCP joints are present. Soft tissue swelling is present dorsally. Severe degenerative changes at the 1st carpometacarpal joint are noted. IMPRESSION: 1. Erosive changes within the 2nd metatarsal head and probable erosions of the 3rd metatarsal head and  along the interphalangeal joints of the 2nd and 3rd fingers. With periarticular calcifications, these findings are most likely related to may be related to gout. Associated soft tissue swelling. 2. No acute fracture or dislocation. Electronically Signed   By: Margarette Canada M.D.   On: 04/22/2021 11:49    Labs on Admission: I have personally reviewed the available labs and imaging studies at the time of the admission.  Pertinent labs: WBC 12.5, hemoglobin 12.3, uric acid 9.0, BUN 45, creatinine 2.70, blood glucose 116, albumin 3.4, AST 186, initial lactic acid 2.2, repeat lactic acid 1.7.   Assessment/Plan: Intractable right hand pain/swelling likely secondary to acute gout flare versus nonpurulent cellulitis: The patient will be admitted to the medical/surgical floor under observation status.  Plain film imaging shows  findings consistent with gout.  Unclear if there was any trauma.  He also has an elevated serum uric acid level.  But in addition has elevated infectious markers as well with a lactic acidosis and leukocytosis.  Due to his renal insufficiency we cannot treat his acute gout flare with NSAIDs.  We will start IV steroids.  The patient was given prednisone 60 mg p.o. x1 in the emergency department.  Pain medications as needed have been ordered.  Continue the home colchicine and allopurinol but renally dosed.  Start antibiotics with cefazolin.  Clinically if the patient does not improve consider consulting with rheumatology.  The wife is home health services with skilled nursing and PT upon discharge.  Ambulatory dysfunction: PT/OT evaluation ordered  Chronic kidney disease: The patient has a history of chronic kidney disease unclear what his baseline is.  His home valsartan/hydrochlorothiazide will be held if this is an acute kidney injury.  Continue monitoring on routine BMP.  Start gentle IV fluids with lactated Ringer's at 50 cc an hour x1 day.  Hypertension: The patient's  valsartan/hydrochlorothiazide will be held as noted above.  Continue home verapamil 240 mg daily.  Hold home p.o. hydralazine as needed.  Start hydralazine 10 mg IV every 6 hours for SBP greater than 180.  Hyperlipidemia: Continue home pravastatin 40 mg daily.  GERD: Continue home Protonix 20 mg daily.  Level of Care: MedSurg DVT prophylaxis: Heparin subcu Code Status: Full code Consults: None Admission status: Observation   Leslee Home DO [facility]   How to contact the [facility] Attending or Consulting provider [place] or covering provider during after hours [place], for this patient?  Check the care team in [facility] and look for a) attending/consulting TR[facility] provider listed and b) the [facility] team listed Log into [email or web address] and use [place]'s universal password to access. If you do not have the password, please contact the hospital operator. Locate the [facility] provider you are looking for under [facility] and page to a number that you can be directly reached. If you still have difficulty reaching the provider, please page the [facility] (Director on Call) for the Hospitalists listed on amion for assistance.   04/22/2021, 6:26 PM

## 2021-04-23 DIAGNOSIS — M109 Gout, unspecified: Secondary | ICD-10-CM | POA: Diagnosis not present

## 2021-04-23 LAB — BASIC METABOLIC PANEL
Anion gap: 12 (ref 5–15)
BUN: 56 mg/dL — ABNORMAL HIGH (ref 8–23)
CO2: 21 mmol/L — ABNORMAL LOW (ref 22–32)
Calcium: 8.8 mg/dL — ABNORMAL LOW (ref 8.9–10.3)
Chloride: 103 mmol/L (ref 98–111)
Creatinine, Ser: 2.65 mg/dL — ABNORMAL HIGH (ref 0.61–1.24)
GFR, Estimated: 24 mL/min — ABNORMAL LOW (ref >60)
Glucose, Bld: 190 mg/dL — ABNORMAL HIGH (ref 70–99)
Potassium: 3.8 mmol/L (ref 3.5–5.1)
Sodium: 136 mmol/L (ref 135–145)

## 2021-04-23 LAB — CBC
HCT: 34.8 % — ABNORMAL LOW (ref 39.0–52.0)
Hemoglobin: 10.9 g/dL — ABNORMAL LOW (ref 13.0–17.0)
MCH: 30 pg (ref 26.0–34.0)
MCHC: 31.3 g/dL (ref 30.0–36.0)
MCV: 95.9 fL (ref 80.0–100.0)
Platelets: 248 10*3/uL (ref 150–400)
RBC: 3.63 MIL/uL — ABNORMAL LOW (ref 4.22–5.81)
RDW: 13.1 % (ref 11.5–15.5)
WBC: 9.9 10*3/uL (ref 4.0–10.5)
nRBC: 0 % (ref 0.0–0.2)

## 2021-04-23 LAB — SARS CORONAVIRUS 2 (TAT 6-24 HRS): SARS Coronavirus 2: NEGATIVE

## 2021-04-23 MED ORDER — PREDNISONE 20 MG PO TABS: ORAL_TABLET | ORAL | 0 refills | Status: DC

## 2021-04-23 MED ORDER — PREDNISONE 20 MG PO TABS
40.0000 mg | ORAL_TABLET | Freq: Every day | ORAL | Status: DC
  Administered 2021-04-23: 40 mg via ORAL

## 2021-04-23 MED ORDER — VALSARTAN-HYDROCHLOROTHIAZIDE 320-25 MG PO TABS: ORAL_TABLET | ORAL | Status: DC

## 2021-04-23 NOTE — Evaluation (Signed)
Occupational Therapy Evaluation Patient Details Name: Jason Estrada MRN: [id number] DOB: 03-30-1941 Today's Date: 04/23/2021    History of Present Illness 80 y.o. male with a past medical history of gout, dementia, hypertension, chronic kidney disease unspecified stage.  The patient presents to the emergency department due to right hand and right wrist swelling that started Thursday and has gotten worse since then.   Clinical Impression   Pt was seen for OT evaluation this date. Prior to hospital admission, per pt report (no family present to verify) pt was ambulating with a SPC, denies hx of falls, and reports indep with basic ADL, still driving some. Pt oriented x3, follows commands with cues. Pt lives with his spouse in a 1 story home +1 step to enter. Currently pt demonstrates impairments safety, balance, strength, and RUE edema/pain as described below (See OT problem list) which functionally limit his ability to perform ADL/self-care tasks. Pt currently requires PRN MIN A for LB ADL, CGA-MIN A for ADL transfers + HHA on R side, supporting R elbow weightbearing, and cues for safety. Pt would benefit from skilled OT services to address noted impairments and functional limitations (see below for any additional details) in order to maximize safety and independence while minimizing falls risk and caregiver burden. Upon hospital discharge, recommend [place] and 24/7 supervision for safety to maximize pt safety and return to functional independence during meaningful occupations of daily life.      Follow Up Recommendations  Home health OT;Supervision/Assistance - 24 hour    Equipment Recommendations  None recommended by OT    Recommendations for Other Services       Precautions / Restrictions Precautions Precautions: Fall Restrictions Weight Bearing Restrictions: No      Mobility Bed Mobility Overal bed mobility: Needs Assistance Bed Mobility: Supine to Sit     Supine to sit:  Supervision          Transfers Overall transfer level: Needs assistance Equipment used: 1 person hand held assist Transfers: Sit to/from Stand Sit to Stand: Min assist         General transfer comment: cues for safety    Balance Overall balance assessment: Needs assistance Sitting-balance support: No upper extremity supported;Feet supported Sitting balance-Leahy Scale: Good     Standing balance support: Single extremity supported Standing balance-Leahy Scale: Fair                             ADL either performed or assessed with clinical judgement   ADL Overall ADL's : Needs assistance/impaired                                       General ADL Comments: Pt able to perform LB dressing with CGA in standing to complete over hips, CGA-MIN A for ADL Transfers     Vision Baseline Vision/History: Wears glasses Wears Glasses: At all times Patient Visual Report: No change from baseline       Perception     Praxis      Pertinent Vitals/Pain Pain Assessment: Faces Faces Pain Scale: Hurts a little bit Pain Location: LUE Pain Descriptors / Indicators: Discomfort;Guarding Pain Intervention(s): Limited activity within patient's tolerance;Monitored during session;Repositioned     Hand Dominance Right   Extremity/Trunk Assessment Upper Extremity Assessment Upper Extremity Assessment: RUE deficits/detail RUE Deficits / Details: edema, pt endorses discomfort, decreased grip RUE: Unable to  fully assess due to pain   Lower Extremity Assessment Lower Extremity Assessment: Generalized weakness       Communication Communication Communication: No difficulties   Cognition Arousal/Alertness: Awake/alert Behavior During Therapy: WFL for tasks assessed/performed Overall Cognitive Status: No family/caregiver present to determine baseline cognitive functioning                                 General Comments: Pt alert and  disoriented to place, follows commands well   General Comments       Exercises     Shoulder Instructions      Home Living Family/patient expects to be discharged to:: Private residence Living Arrangements: Spouse/significant other Available Help at Discharge: Family Type of Home: House Home Access: Stairs to enter Technical brewer of Steps: 1   Home Layout: One level                          Prior Functioning/Environment Level of Independence: Independent with assistive device(s)        Comments: Pt reports mod indep with SPC for mobility. Indep with basic ADL. He and wife both manage cleaning, take out meals, pt endorses still driving some. Denies difficulty with med mgt (uses pill box to stay organized), and denies falls. No family present to verify PLOF/home set up        OT Problem List: Decreased strength;Decreased activity tolerance;Impaired balance (sitting and/or standing);Pain;Decreased knowledge of use of DME or AE;Decreased safety awareness      OT Treatment/Interventions: Self-care/ADL training;Therapeutic exercise;Therapeutic activities;Cognitive remediation/compensation;DME and/or AE instruction;Patient/family education;Balance training    OT Goals(Current goals can be found in the care plan section) Acute Rehab OT Goals Patient Stated Goal: go home with wife today OT Goal Formulation: With patient Time For Goal Achievement: 05/07/21 Potential to Achieve Goals: Good ADL Goals Pt Will Transfer to Toilet: with supervision;ambulating;regular height toilet (LRAD PRN) Additional ADL Goal #1: Pt will perform morning ADL routine including ADL transfers with supervision for safety, LRAD PRN.  OT Frequency: Min 1X/week   Barriers to D/C:            Co-evaluation              AM-PAC OT "6 Clicks" Daily Activity     Outcome Measure Help from another person eating meals?: None Help from another person taking care of personal grooming?: A  Little Help from another person toileting, which includes using toliet, bedpan, or urinal?: A Little Help from another person bathing (including washing, rinsing, drying)?: A Little Help from another person to put on and taking off regular upper body clothing?: None Help from another person to put on and taking off regular lower body clothing?: A Little 6 Click Score: 20   End of Session Equipment Utilized During Treatment: Gait belt  Activity Tolerance: Patient tolerated treatment well Patient left: in chair;with call bell/phone within reach;Other (comment) (PT coming in soon, provided with chair alarm)  OT Visit Diagnosis: Other abnormalities of gait and mobility (R26.89);Muscle weakness (generalized) (M62.81);History of falling (Z91.81)                Time: 1856-3149 OT Time Calculation (min): 17 min Charges:  OT General Charges $OT Visit: 1 Visit OT Evaluation $OT Eval Low Complexity: 1 Low OT Treatments $Self Care/Home Management : 8-22 mins  Hanley Hays, MPH, MS, OTR/L ascom [phone] 04/23/21, 12:41 PM

## 2021-04-23 NOTE — Progress Notes (Signed)
VSS, IV cath removed site c/d/I. Discussed d/c packet with pt and wife at bedside. Pt transported via w/c with d/c packet and walker attachment down to private car.

## 2021-04-23 NOTE — Plan of Care (Signed)
Patient alert and oriented x3-4 requires reorientation. Complains of acute pain to right upper extremity that remains tolerable within his pain goal parameters. Expresses desire to discharge home today. Vitals stable, no respiratory distress on room air. Cleared of all airborne precautions due to negative Covid screening. Right upper extremity remains edematous and intact. Stable condition at the end of shift, will continue to monitor.  Problem: Education: Goal: Knowledge of General Education information will improve Description: Including pain rating scale, medication(s)/side effects and non-pharmacologic comfort measures Outcome: Progressing   Problem: Health Behavior/Discharge Planning: Goal: Ability to manage health-related needs will improve Outcome: Progressing   Problem: Clinical Measurements: Goal: Ability to maintain clinical measurements within normal limits will improve Outcome: Progressing Goal: Will remain free from infection Outcome: Progressing Goal: Diagnostic test results will improve Outcome: Progressing Goal: Respiratory complications will improve Outcome: Progressing Goal: Cardiovascular complication will be avoided Outcome: Progressing   Problem: Activity: Goal: Risk for activity intolerance will decrease Outcome: Progressing   Problem: Nutrition: Goal: Adequate nutrition will be maintained Outcome: Progressing   Problem: Coping: Goal: Level of anxiety will decrease Outcome: Progressing   Problem: Elimination: Goal: Will not experience complications related to bowel motility Outcome: Progressing Goal: Will not experience complications related to urinary retention Outcome: Progressing   Problem: Skin Integrity: Goal: Risk for impaired skin integrity will decrease Outcome: Progressing

## 2021-04-23 NOTE — Discharge Summary (Signed)
Physician Discharge Summary   Jason Estrada  male DOB: March 06, 1941  AYT:[id number]  PCP: Pcp, No  Admit date: 04/22/2021 Discharge date: 04/23/2021  Admitted From: home Disposition:  home Wife updated at bedside prior to discharge.  Home Health: Yes CODE STATUS: Full code  Discharge Instructions     Discharge instructions   Complete by: As directed    Xray showed you have gout in your right hand knuckles.  Symptoms improved with steroid.  Please complete steroid course and taper at home as directed.  Please hold your Diovan until outpatient followup due to your soft blood pressure.   Dr. Enzo Bi - -   SARS Coronavirus 2 (TAT 6-24 hrs)   Complete by: As directed    Is this test for diagnosis or screening: Screening   Symptomatic for COVID-19 as defined by CDC: No   Hospitalized for COVID-19: No   Admitted to ICU for COVID-19: No   Previously tested for COVID-19: No   Resident in a congregate (group) care setting: Unknown   Employed in healthcare setting: Unknown   Has patient completed COVID vaccination(s) (2 doses of Pfizer/Moderna 1 dose of [employer]): Unknown        Hospital Course:  For full details, please see H&P, progress notes, consult notes and ancillary notes.  Briefly,  Jason Estrada is a 80 y.o. male with a past medical history of gout, dementia, hypertension, chronic kidney disease.  The patient presented to the emergency department due to right hand and right wrist swelling.    Has a history of gout but it has never affected his hands.  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  He was unable to use his walker because of his right hand pain.  He had fallen several times.  He couldn't get himself off the toilet and his wife couldn't take care of him at home since this had been going on.   Intractable right hand pain and swelling secondary to acute gout flare  --Pt had nodules over the right hand knuckles.  Plain film  imaging showed findings consistent with gout.  He also had an elevated serum uric acid level to 9.0.  Pt and wife reported compliance with home allopurinol and colchicine.   --Due to his renal insufficiency, pt was started on steroid to treat his acute gout flare.  Pt received IV solumedrol.   --Hand pain improved the next day.  PT worked with pt and pt was able to ambulate using his walker, and was deemed safe to discharge home.   --Pt was discharged home with a long prednisone taper (see below).  Cellulitis ruled out --Started on cefazolin on admission.  Presentation more consistent with gout flare.  Abx d/c'ed.  Chronic kidney disease stage 4 The patient has a history of chronic kidney disease unclear what his baseline is.   --CR 2.7 on presentation, and was 2.65 next day after gentle MIVF.   --likely stage 4 CKD   Hypertension:  Continue home verapamil 240 --Home Diovan held pending outpatient PCP followup due to low normal BP.   Hyperlipidemia:  Continue home pravastatin 40 mg daily.   GERD:  Continue home Protonix 20 mg daily.   Discharge Diagnoses:  Active Problems:   Cellulitis   Gout attack     Discharge Instructions:  Allergies as of 04/23/2021   No Known Allergies      Medication List     STOP taking these medications  hydrALAZINE 50 MG tablet Commonly known as: APRESOLINE       TAKE these medications    allopurinol 100 MG tablet Commonly known as: ZYLOPRIM Take 100 mg by mouth daily.   aspirin EC 81 MG tablet Take 81 mg by mouth daily. Swallow whole.   baclofen 10 MG tablet Commonly known as: LIORESAL Take 10 mg by mouth daily.   colchicine 0.6 MG tablet Take 0.6 mg by mouth every evening.   folic acid 1 MG tablet Commonly known as: FOLVITE Take 1 mg by mouth daily.   pantoprazole 20 MG tablet Commonly known as: PROTONIX Take 20 mg by mouth daily.   pravastatin 40 MG tablet Commonly known as: PRAVACHOL Take 40 mg by mouth daily.    predniSONE 20 MG tablet Commonly known as: DELTASONE Take 40 mg (2 tablets) from 8/9 to 8/13, then 20 mg (1 tablet) from 8/14 to 8/18, then 10 mg (1/2 tablet) from 8/19 to 05/08/21, then done.   valsartan-hydrochlorothiazide 320-25 MG tablet Commonly known as: DIOVAN-HCT Hold until followup with your outpatient doctor due to your low normal blood pressure. What changed:  how much to take how to take this when to take this additional instructions   verapamil 240 MG CR tablet Commonly known as: CALAN-SR Take 240 mg by mouth daily.   zolpidem 10 MG tablet Commonly known as: AMBIEN Take 10 mg by mouth at bedtime as needed for sleep.               Durable Medical Equipment  (From admission, onward)           Start     Ordered   04/23/21 1104  For home use only DME Other see comment  Once       Comments: Right sided platform for Rolling walker  Question:  Length of Need  Answer:  6 Months   04/23/21 1103             Follow-up Information     Your PCP Follow up in 1 week(s).                  No Known Allergies   The results of significant diagnostics from this hospitalization (including imaging, microbiology, ancillary and laboratory) are listed below for reference.   Consultations:   Procedures/Studies: DG Hand Complete Right  Result Date: 04/22/2021 CLINICAL DATA:  Acute RIGHT hand pain and swelling. Reported history of gout. EXAM: RIGHT HAND - COMPLETE 3+ VIEW COMPARISON:  None. FINDINGS: No acute fracture or dislocation noted. An erosion with sclerotic edges in the 2nd metatarsal head is noted, suggestive of gout. Probable erosions within the 3rd metatarsal head and along the interphalangeal joints of the 2nd and 3rd fingers are noted. Periarticular calcifications along the 3rd and 4th MCP joints are present. Soft tissue swelling is present dorsally. Severe degenerative changes at the 1st carpometacarpal joint are noted. IMPRESSION: 1. Erosive  changes within the 2nd metatarsal head and probable erosions of the 3rd metatarsal head and along the interphalangeal joints of the 2nd and 3rd fingers. With periarticular calcifications, these findings are most likely related to may be related to gout. Associated soft tissue swelling. 2. No acute fracture or dislocation. Electronically Signed   By: Margarette Canada M.D.   On: 04/22/2021 11:49      Labs: BNP (last 3 results) No results for input(s): BNP in the last 8760 hours. Basic Metabolic Panel: Recent Labs  Lab 04/22/21 0924 04/23/21 0409  NA 138  136  K 3.7 3.8  CL 103 103  CO2 23 21*  GLUCOSE 116* 190*  BUN 45* 56*  CREATININE 2.70* 2.65*  CALCIUM 9.5 8.8*   Liver Function Tests: Recent Labs  Lab 04/22/21 0924  AST 186*  ALT 41  ALKPHOS 79  BILITOT 1.0  PROT 7.3  ALBUMIN 3.4*   No results for input(s): LIPASE, AMYLASE in the last 168 hours. No results for input(s): AMMONIA in the last 168 hours. CBC: Recent Labs  Lab 04/22/21 0924 04/23/21 0409  WBC 12.5* 9.9  HGB 12.3* 10.9*  HCT 37.9* 34.8*  MCV 94.0 95.9  PLT 259 248   Cardiac Enzymes: No results for input(s): CKTOTAL, CKMB, CKMBINDEX, TROPONINI in the last 168 hours. BNP: Invalid input(s): POCBNP CBG: No results for input(s): GLUCAP in the last 168 hours. D-Dimer No results for input(s): DDIMER in the last 72 hours. Hgb A1c No results for input(s): HGBA1C in the last 72 hours. Lipid Profile No results for input(s): CHOL, HDL, LDLCALC, TRIG, CHOLHDL, LDLDIRECT in the last 72 hours. Thyroid function studies No results for input(s): TSH, T4TOTAL, T3FREE, THYROIDAB in the last 72 hours.  Invalid input(s): FREET3 Anemia work up No results for input(s): VITAMINB12, FOLATE, FERRITIN, TIBC, IRON, RETICCTPCT in the last 72 hours. Urinalysis No results found for: COLORURINE, APPEARANCEUR, [place], [place], [place], [place], [place], [place], PROTEINUR, UROBILINOGEN, NITRITE, LEUKOCYTESUR Sepsis  Labs Invalid input(s): PROCALCITONIN,  WBC,  LACTICIDVEN Microbiology Recent Results (from the past 240 hour(s))  SARS Coronavirus 2 (TAT 6-24 hrs)     Status: None   Collection Time: 04/22/21 12:40 PM   Specimen: Nasopharyngeal Swab  Result Value Ref Range Status   SARS Coronavirus 2 NEGATIVE NEGATIVE Final    Comment: (NOTE) SARS-CoV-2 target nucleic acids are NOT DETECTED.  The SARS-CoV-2 RNA is generally detectable in upper and lower respiratory specimens during the acute phase of infection. Negative results do not preclude SARS-CoV-2 infection, do not rule out co-infections with other pathogens, and should not be used as the sole basis for treatment or other patient management decisions. Negative results must be combined with clinical observations, patient history, and epidemiological information. The expected result is Negative.  Fact Sheet for Patients: SugarRoll.be  Fact Sheet for Healthcare Providers: https://www.woods-mathews.com/  This test is not yet approved or cleared by the [place] FDA and  has been authorized for detection and/or diagnosis of SARS-CoV-2 by FDA under an Emergency Use Authorization (EUA). This EUA will remain  in effect (meaning this test can be used) for the duration of the COVID-19 declaration under Se ction 564(b)(1) of the Act, 21 U.S.C. section 360bbb-3(b)(1), unless the authorization is terminated or revoked sooner.  Performed at [facility], [place] [address] [place], [place] [address]      Total time spend on discharging this patient, including the last patient exam, discussing the hospital stay, instructions for ongoing care as it relates to all pertinent caregivers, as well as preparing the medical discharge records, prescriptions, and/or referrals as applicable, is 50 minutes.    Enzo Bi, MD  [facility] 04/23/2021, 2:18 PM

## 2021-04-23 NOTE — Evaluation (Signed)
Physical Therapy Evaluation Patient Details Name: Jason Estrada MRN: [id number] DOB: 08-Feb-1941 Today's Date: 04/23/2021   History of Present Illness  Pt is a 80 y.o. M brought to ED for R hand pain and swelling with plain film inmaging consistent with gout. Pt has been unable to utilize RW due to symptoms. PMH includes gout, dementia, HTN, CKD  Clinical Impression  Pt alert, seated in recliner. Pt is oriented to self and DOB, disoriented to place, and situation. Pt states PLOF as mod-I with SPC which matches chart review. Pt is unable to provide additional accurate history due to dementia.   Pt was able to perform sit <>stand x 3 with min-guard & RW w/ R platform attachment and cues for technique. Pt was able to strap platform attachment to RUE x 3 without assistance while maintaining standing balance without signs of instability. Pt ambulated > 200 ft w/ min-guard, RW w/ platform with good stability and cadence. Pt demonstrated stepping backward onto 1 step without rails utilizing RW and min-guard with good technique. Skilled PT intervention is indicated to address deficits in function, mobility, and to return to PLOF as able.  Discharge recommendations are HHPT or outpatient PT to further develop strength,balance, and return to PLOF.     Follow Up Recommendations Home health PT;Outpatient PT (Outpatient PT if unable to qualify for HHPT)    Equipment Recommendations  Rolling walker with 5" wheels;Other (comment) (R platform attachment)    Recommendations for Other Services       Precautions / Restrictions Precautions Precautions: Fall Restrictions Weight Bearing Restrictions: No      Mobility  Bed Mobility Overal bed mobility: Needs Assistance Bed Mobility: Supine to Sit     Supine to sit: Supervision     General bed mobility comments: Pt seated in recliner    Transfers Overall transfer level: Needs assistance Equipment used: Right platform walker Transfers: Sit to/from  Stand Sit to Stand: Min guard         General transfer comment: Pt able to stand from lowered surfaces with cues for technique. Pt is able to strap and unstrap platform to hand with CGA  Ambulation/Gait   Gait Distance (Feet): 210 Feet Assistive device: Right platform walker Gait Pattern/deviations: Decreased weight shift to left;Step-through pattern     General Gait Details: Pt demonstrated good cadence and ability to turn RW without signs of instability.  Stairs Stairs: Yes Stairs assistance: Min guard Stair Management: No rails;Backwards;With walker Number of Stairs: 1 General stair comments: Pt stepped backward 1 step while utilizing RW with LUE for balance  Wheelchair Mobility    Modified Rankin (Stroke Patients Only)       Balance Overall balance assessment: Needs assistance Sitting-balance support: No upper extremity supported;Feet supported Sitting balance-Leahy Scale: Good     Standing balance support: No upper extremity supported Standing balance-Leahy Scale: Good Standing balance comment: Pt is able to stand without support and strap RUE onto platform walker without LOB                             Pertinent Vitals/Pain Pain Assessment: 0-10 Pain Score: 3  Faces Pain Scale: Hurts a little bit Pain Location: RUE Pain Descriptors / Indicators: Discomfort;Guarding Pain Intervention(s): Limited activity within patient's tolerance;Monitored during session;Repositioned    Home Living Family/patient expects to be discharged to:: Private residence Living Arrangements: Spouse/significant other Available Help at Discharge: Family Type of Home: House Home Access: Stairs to enter  Entrance Stairs-Rails: None Entrance Stairs-Number of Steps: 1 Home Layout: One level Home Equipment: Other (comment);Cane - quad;Walker - 2 wheels (toilet seat riser)      Prior Function Level of Independence: Independent with assistive device(s)          Comments: Pt states mod-I with SPC for both household and community ambulation.     Hand Dominance   Dominant Hand: Right    Extremity/Trunk Assessment   Upper Extremity Assessment Upper Extremity Assessment: RUE deficits/detail RUE Deficits / Details: decreased AROM with finger and wrist extension secondary to edema RUE: Unable to fully assess due to pain LUE Deficits / Details: Able to move against gravity, grip, press, and pull    Lower Extremity Assessment Lower Extremity Assessment: Overall WFL for tasks assessed (5/5 MMT bilateral ankle DF, knee flexion; 4/5 bilateral knee extension, hip flexion) LLE Sensation: WNL       Communication   Communication: No difficulties  Cognition Arousal/Alertness: Awake/alert Behavior During Therapy: WFL for tasks assessed/performed Overall Cognitive Status: No family/caregiver present to determine baseline cognitive functioning                                 General Comments: Pt is disoriented to place and time, follows one-step commands      General Comments      Exercises     Assessment/Plan    PT Assessment Patient needs continued PT services  PT Problem List Decreased strength;Decreased range of motion;Decreased activity tolerance;Decreased balance;Decreased mobility;Decreased coordination       PT Treatment Interventions Balance training;Gait training;Stair training;Functional mobility training;Therapeutic activities;Therapeutic exercise;Neuromuscular re-education    PT Goals (Current goals can be found in the Care Plan section)  Acute Rehab PT Goals Patient Stated Goal: go home with wife today PT Goal Formulation: With patient Time For Goal Achievement: 05/07/21 Potential to Achieve Goals: Good    Frequency Min 2X/week   Barriers to discharge        Co-evaluation               AM-PAC PT "6 Clicks" Mobility  Outcome Measure Help needed turning from your back to your side while in a  flat bed without using bedrails?: A Little Help needed moving from lying on your back to sitting on the side of a flat bed without using bedrails?: A Little Help needed moving to and from a bed to a chair (including a wheelchair)?: A Little Help needed standing up from a chair using your arms (e.g., wheelchair or bedside chair)?: A Little Help needed to walk in hospital room?: A Little Help needed climbing 3-5 steps with a railing? : A Little 6 Click Score: 18    End of Session Equipment Utilized During Treatment: Gait belt Activity Tolerance: Patient tolerated treatment well Patient left: in chair;with call bell/phone within reach;with chair alarm set Nurse Communication: Mobility status PT Visit Diagnosis: Repeated falls (R29.6);Muscle weakness (generalized) (M62.81)    Time: 6256-3893 PT Time Calculation (min) (ACUTE ONLY): 37 min   Charges:             [employer], SPT

## 2021-04-27 ENCOUNTER — Encounter: Payer: Self-pay | Admitting: Internal Medicine

## 2021-04-27 ENCOUNTER — Telehealth: Payer: Self-pay | Admitting: Internal Medicine

## 2021-04-27 ENCOUNTER — Other Ambulatory Visit: Payer: Self-pay

## 2021-04-27 ENCOUNTER — Ambulatory Visit: Payer: Federal, State, Local not specified - PPO | Admitting: Internal Medicine

## 2021-04-27 VITALS — BP 108/60 | HR 72 | Temp 97.8°F | Ht 68.9 in | Wt 190.2 lb

## 2021-04-27 DIAGNOSIS — M1A9XX1 Chronic gout, unspecified, with tophus (tophi): Secondary | ICD-10-CM | POA: Diagnosis not present

## 2021-04-27 DIAGNOSIS — M25561 Pain in right knee: Secondary | ICD-10-CM

## 2021-04-27 DIAGNOSIS — T148XXA Other injury of unspecified body region, initial encounter: Secondary | ICD-10-CM

## 2021-04-27 DIAGNOSIS — Z23 Encounter for immunization: Secondary | ICD-10-CM

## 2021-04-27 DIAGNOSIS — N184 Chronic kidney disease, stage 4 (severe): Secondary | ICD-10-CM | POA: Diagnosis not present

## 2021-04-27 DIAGNOSIS — R4189 Other symptoms and signs involving cognitive functions and awareness: Secondary | ICD-10-CM | POA: Diagnosis not present

## 2021-04-27 LAB — URINALYSIS, ROUTINE W REFLEX MICROSCOPIC
Bilirubin, UA: NEGATIVE
Glucose, UA: NEGATIVE
Ketones, UA: NEGATIVE
Leukocytes,UA: NEGATIVE
Nitrite, UA: NEGATIVE
Protein,UA: NEGATIVE
RBC, UA: NEGATIVE
Specific Gravity, UA: 1.02 (ref 1.005–1.030)
Urobilinogen, Ur: 0.2 mg/dL (ref 0.2–1.0)
pH, UA: 5 (ref 5.0–7.5)

## 2021-04-27 MED ORDER — COLCHICINE 0.6 MG PO TABS: 0.6000 mg | ORAL_TABLET | Freq: Every day | ORAL | 3 refills | Status: DC

## 2021-04-27 MED ORDER — BACITRACIN-NEOMYCIN-POLYMYXIN 400-5-5000 EX OINT: 1.0000 "application " | TOPICAL_OINTMENT | Freq: Two times a day (BID) | CUTANEOUS | 2 refills | Status: DC

## 2021-04-27 NOTE — Telephone Encounter (Signed)
Home Health Verbal Orders - Caller/Agency: T[facility]// [facility] Callback Number:[phone] opt 2 Requesting OT/PT/Skilled Nursing/Social Work/Speech Therapy: PT Frequency: 2x for 4wks, 1x for 5wks

## 2021-04-27 NOTE — Progress Notes (Signed)
BP 108/60   Pulse 72   Temp 97.8 F (36.6 C) (Oral)   Ht 5' 8.9" (1.75 m)   Wt 190 lb 3.2 oz (86.3 kg)   SpO2 98%   BMI 28.17 kg/m    Subjective:    Patient ID: Jason Estrada, male    DOB: [birth year], 80 y.o.   MRN: [id number]  Chief Complaint  Patient presents with  . New Patient (Initial Visit)    To est. Care.  . Fall    Patient was just released from Hospital care. Patient does have abrasion on right knee from fall. Hosp [facility] up with [facility] for [facility] at home.  . Hand swelling  . Memory Loss    Patient's wife states that he has been losing big gaps of time and finding it hard to think of words.    HPI: Jason Estrada is a 80 y.o. male  Pt is new to the practice, moved from [place] in April 2022.    has been on a walker x 3 months because of a knee injury. Fell sec to not using a walker sec to hand swelling, per wife WBC elevated   Cognitive decline x 8- 9 months per wife, has had loss of words now - moved end of April - missed 1st appointment with me per wife, hasn't ever been here.    Fall The accident occurred More than 1 week ago. Pertinent negatives include no abdominal pain, fever, headaches, hematuria, nausea, numbness, tingling or vomiting.  Knee Pain  The incident occurred more than 1 week ago (x right knee pain chronic). The pain is at a severity of 5/10. The pain is moderate. Associated symptoms include an inability to bear weight. Pertinent negatives include no loss of motion, loss of sensation, muscle weakness, numbness or tingling. The treatment provided moderate relief.   Chief Complaint  Patient presents with  . New Patient (Initial Visit)    To est. Care.  . Fall    Patient was just released from Hospital care. Patient does have abrasion on right knee from fall. Hosp [facility] up with [facility] for [facility] at home.  . Hand swelling  . Memory Loss    Patient's wife states that he has been losing big gaps of time and finding it  hard to think of words.    Relevant past medical, surgical, family and social history reviewed and updated as indicated. Interim medical history since our last visit reviewed. Allergies and medications reviewed and updated.  Review of Systems  Constitutional:  Negative for activity change, appetite change, chills, fatigue and fever.  HENT:  Negative for congestion, ear discharge, ear pain and facial swelling.   Eyes:  Negative for pain, discharge and itching.  Respiratory:  Negative for cough, chest tightness, shortness of breath and wheezing.   Cardiovascular:  Negative for chest pain, palpitations and leg swelling.  Gastrointestinal:  Negative for abdominal distention, abdominal pain, blood in stool, constipation, diarrhea, nausea and vomiting.  Endocrine: Negative for cold intolerance, heat intolerance, polydipsia, polyphagia and polyuria.  Genitourinary:  Negative for difficulty urinating, dysuria, flank pain, frequency, hematuria and urgency.  Musculoskeletal:  Negative for arthralgias, gait problem, joint swelling and myalgias.  Skin:  Positive for wound. Negative for color change and rash.       Right knee wound noted.   Neurological:  Negative for dizziness, tingling, tremors, speech difficulty, weakness, light-headedness, numbness and headaches.  Hematological:  Does not bruise/bleed easily.  Psychiatric/Behavioral:  Positive for behavioral problems and confusion. Negative for agitation, decreased concentration, sleep disturbance and suicidal ideas.    Per HPI unless specifically indicated above     Objective:    BP 108/60   Pulse 72   Temp 97.8 F (36.6 C) (Oral)   Ht 5' 8.9" (1.75 m)   Wt 190 lb 3.2 oz (86.3 kg)   SpO2 98%   BMI 28.17 kg/m   Wt Readings from Last 3 Encounters:  04/27/21 190 lb 3.2 oz (86.3 kg)  04/22/21 150 lb (68 kg)    Physical Exam Vitals and nursing note reviewed.  Constitutional:      General: He is not in acute distress.    Appearance:  Normal appearance. He is not ill-appearing or diaphoretic.  HENT:     Head: Normocephalic and atraumatic.     Right Ear: Tympanic membrane and external ear normal. There is no impacted cerumen.     Left Ear: External ear normal.     Nose: No congestion or rhinorrhea.     Mouth/Throat:     Pharynx: No oropharyngeal exudate or posterior oropharyngeal erythema.  Eyes:     Conjunctiva/sclera: Conjunctivae normal.     Pupils: Pupils are equal, round, and reactive to light.  Cardiovascular:     Rate and Rhythm: Normal rate and regular rhythm.     Heart sounds: No murmur heard.   No friction rub. No gallop.  Pulmonary:     Effort: No respiratory distress.     Breath sounds: No stridor. No wheezing or rhonchi.  Chest:     Chest wall: No tenderness.  Abdominal:     General: Abdomen is flat. Bowel sounds are normal.     Palpations: Abdomen is soft. There is no mass.     Tenderness: There is no abdominal tenderness.  Musculoskeletal:     Cervical back: Normal range of motion and neck supple. No rigidity or tenderness.     Left lower leg: No edema.  Skin:    General: Skin is warm and dry.  Neurological:     Mental Status: He is alert.    Results for orders placed or performed during the hospital encounter of 04/22/21  SARS Coronavirus 2 (TAT 6-24 hrs)   Specimen: Nasopharyngeal Swab  Result Value Ref Range   SARS Coronavirus 2 NEGATIVE NEGATIVE  CBC  Result Value Ref Range   WBC 12.5 (H) 4.0 - 10.5 K/uL   RBC 4.03 (L) 4.22 - 5.81 MIL/uL   Hemoglobin 12.3 (L) 13.0 - 17.0 g/dL   HCT 37.9 (L) 39.0 - 52.0 %   MCV 94.0 80.0 - 100.0 fL   MCH 30.5 26.0 - 34.0 pg   MCHC 32.5 30.0 - 36.0 g/dL   RDW 13.4 11.5 - 15.5 %   Platelets 259 150 - 400 K/uL   nRBC 0.0 0.0 - 0.2 %  Comprehensive metabolic panel  Result Value Ref Range   Sodium 138 135 - 145 mmol/L   Potassium 3.7 3.5 - 5.1 mmol/L   Chloride 103 98 - 111 mmol/L   CO2 23 22 - 32 mmol/L   Glucose, Bld 116 (H) 70 - 99 mg/dL    BUN 45 (H) 8 - 23 mg/dL   Creatinine, Ser 2.70 (H) 0.61 - 1.24 mg/dL   Calcium 9.5 8.9 - 10.3 mg/dL   Total Protein 7.3 6.5 - 8.1 g/dL   Albumin 3.4 (L) 3.5 - 5.0 g/dL   AST 186 (H) 15 - 41  U/L   ALT 41 0 - 44 U/L   Alkaline Phosphatase 79 38 - 126 U/L   Total Bilirubin 1.0 0.3 - 1.2 mg/dL   GFR, Estimated 24 (L) >60 mL/min   Anion gap 12 5 - 15  Lactic acid, plasma  Result Value Ref Range   Lactic Acid, Venous 2.2 (HH) 0.5 - 1.9 mmol/L  Lactic acid, plasma  Result Value Ref Range   Lactic Acid, Venous 1.7 0.5 - 1.9 mmol/L  Uric acid  Result Value Ref Range   Uric Acid, Serum 9.0 (H) 3.7 - 8.6 mg/dL  Basic metabolic panel  Result Value Ref Range   Sodium 136 135 - 145 mmol/L   Potassium 3.8 3.5 - 5.1 mmol/L   Chloride 103 98 - 111 mmol/L   CO2 21 (L) 22 - 32 mmol/L   Glucose, Bld 190 (H) 70 - 99 mg/dL   BUN 56 (H) 8 - 23 mg/dL   Creatinine, Ser 2.65 (H) 0.61 - 1.24 mg/dL   Calcium 8.8 (L) 8.9 - 10.3 mg/dL   GFR, Estimated 24 (L) >60 mL/min   Anion gap 12 5 - 15  CBC  Result Value Ref Range   WBC 9.9 4.0 - 10.5 K/uL   RBC 3.63 (L) 4.22 - 5.81 MIL/uL   Hemoglobin 10.9 (L) 13.0 - 17.0 g/dL   HCT 34.8 (L) 39.0 - 52.0 %   MCV 95.9 80.0 - 100.0 fL   MCH 30.0 26.0 - 34.0 pg   MCHC 31.3 30.0 - 36.0 g/dL   RDW 13.1 11.5 - 15.5 %   Platelets 248 150 - 400 K/uL   nRBC 0.0 0.0 - 0.2 %        Current Outpatient Medications:  .  allopurinol (ZYLOPRIM) 100 MG tablet, Take 100 mg by mouth daily., Disp: , Rfl:  .  baclofen (LIORESAL) 10 MG tablet, Take 10 mg by mouth daily., Disp: , Rfl:  .  folic acid (FOLVITE) 1 MG tablet, Take 1 mg by mouth daily., Disp: , Rfl:  .  hydrALAZINE (APRESOLINE) 50 MG tablet, Take 50 mg by mouth 3 (three) times daily., Disp: , Rfl:  .  pantoprazole (PROTONIX) 20 MG tablet, Take 20 mg by mouth daily., Disp: , Rfl:  .  pravastatin (PRAVACHOL) 40 MG tablet, Take 40 mg by mouth daily., Disp: , Rfl:  .  predniSONE (DELTASONE) 20 MG tablet, Take 40 mg (2  tablets) from 8/9 to 8/13, then 20 mg (1 tablet) from 8/14 to 8/18, then 10 mg (1/2 tablet) from 8/19 to 05/08/21, then done., Disp: 18 tablet, Rfl: 0 .  valsartan-hydrochlorothiazide (DIOVAN-HCT) 320-25 MG tablet, Hold until followup with your outpatient doctor due to your low normal blood pressure., Disp: , Rfl:  .  verapamil (CALAN-SR) 240 MG CR tablet, Take 240 mg by mouth daily., Disp: , Rfl:  .  aspirin EC 81 MG tablet, Take 81 mg by mouth daily. Swallow whole., Disp: , Rfl:  .  colchicine 0.6 MG tablet, Take 0.6 mg by mouth every evening. (Patient not taking: Reported on 04/27/2021), Disp: , Rfl:  .  zolpidem (AMBIEN) 10 MG tablet, Take 10 mg by mouth at bedtime as needed for sleep. (Patient not taking: Reported on 04/27/2021), Disp: , Rfl:     Assessment & Plan:  Right hand swelling is on allopurinol for such now. Uric acid level at 9.0  Is on PT post d/c from hospital. To have home health assessment done on Tuesday.  Pt was placed  on steroids dosepak. 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 Range 04/22/2021 09:24 04/22/2021 11:29 04/23/2021 04:09  Creatinine Latest Ref Range: 0.61 - 1.24 mg/dL 2.70 (H)  2.65 (H)    Right knee wound / pain : start pt on triple antibiotics, wund dressed by nursing.  Will check xrays  Will refer to ortho    Dementia check B12, folate, TSH, RPR.  Patient will be referred to Neurology  appropriate paperwork given to caregiver with information regarding the care for patient with dementia. Patient probably has had severe dementia for a while now. Not been treated for such.     Problem List Items Addressed This Visit   None    Orders Placed This Encounter  Procedures  . DG Knee Complete 4 Views Right  . Tdap vaccine greater than or equal to 7yo IM  . PSA  . Lipid panel  . CBC with Differential/Platelet  . Comprehensive metabolic panel  . Urinalysis, Routine w reflex  microscopic  . Vitamin B12  . TSH  . T4, free  . RPR  . Uric acid  . Ambulatory referral to Nephrology  . Ambulatory referral to Orthopedics  . Ambulatory referral to Neurology     Meds ordered this encounter  Medications  . colchicine 0.6 MG tablet    Sig: Take 1 tablet (0.6 mg total) by mouth daily.    Dispense:  30 tablet    Refill:  3  . neomycin-bacitracin-polymyxin (NEOSPORIN) ointment    Sig: Apply 1 application topically every 12 (twelve) hours.    Dispense:  15 g    Refill:  2     Follow up plan: No follow-ups on file.

## 2021-04-27 NOTE — Telephone Encounter (Signed)
Ok for verbal orders ?

## 2021-04-28 LAB — CBC WITH DIFFERENTIAL/PLATELET
Basophils Absolute: 0 10*3/uL (ref 0.0–0.2)
Basos: 0 %
EOS (ABSOLUTE): 0 10*3/uL (ref 0.0–0.4)
Eos: 0 %
Hematocrit: 39.6 % (ref 37.5–51.0)
Hemoglobin: 12.5 g/dL — ABNORMAL LOW (ref 13.0–17.7)
Immature Grans (Abs): 0.4 10*3/uL — ABNORMAL HIGH (ref 0.0–0.1)
Immature Granulocytes: 3 %
Lymphocytes Absolute: 0.5 10*3/uL — ABNORMAL LOW (ref 0.7–3.1)
Lymphs: 4 %
MCH: 29.1 pg (ref 26.6–33.0)
MCHC: 31.6 g/dL (ref 31.5–35.7)
MCV: 92 fL (ref 79–97)
Monocytes Absolute: 0.5 10*3/uL (ref 0.1–0.9)
Monocytes: 3 %
Neutrophils Absolute: 12.6 10*3/uL — ABNORMAL HIGH (ref 1.4–7.0)
Neutrophils: 90 %
Platelets: 403 10*3/uL (ref 150–450)
RBC: 4.3 x10E6/uL (ref 4.14–5.80)
RDW: 12.4 % (ref 11.6–15.4)
WBC: 14 10*3/uL — ABNORMAL HIGH (ref 3.4–10.8)

## 2021-04-28 LAB — COMPREHENSIVE METABOLIC PANEL
ALT: 18 IU/L (ref 0–44)
AST: 24 IU/L (ref 0–40)
Albumin/Globulin Ratio: 1.5 (ref 1.2–2.2)
Albumin: 3.5 g/dL — ABNORMAL LOW (ref 3.7–4.7)
Alkaline Phosphatase: 82 IU/L (ref 44–121)
BUN/Creatinine Ratio: 25 — ABNORMAL HIGH (ref 10–24)
BUN: 53 mg/dL — ABNORMAL HIGH (ref 8–27)
Bilirubin Total: 0.3 mg/dL (ref 0.0–1.2)
CO2: 21 mmol/L (ref 20–29)
Calcium: 9 mg/dL (ref 8.6–10.2)
Chloride: 102 mmol/L (ref 96–106)
Creatinine, Ser: 2.1 mg/dL — ABNORMAL HIGH (ref 0.76–1.27)
Globulin, Total: 2.4 g/dL (ref 1.5–4.5)
Glucose: 115 mg/dL — ABNORMAL HIGH (ref 65–99)
Potassium: 4.1 mmol/L (ref 3.5–5.2)
Sodium: 140 mmol/L (ref 134–144)
Total Protein: 5.9 g/dL — ABNORMAL LOW (ref 6.0–8.5)
eGFR: 32 mL/min/{1.73_m2} — ABNORMAL LOW (ref >59)

## 2021-04-28 LAB — LIPID PANEL
Chol/HDL Ratio: 2.3 ratio (ref 0.0–5.0)
Cholesterol, Total: 160 mg/dL (ref 100–199)
HDL: 71 mg/dL (ref >39)
LDL Chol Calc (NIH): 69 mg/dL (ref 0–99)
Triglycerides: 114 mg/dL (ref 0–149)
VLDL Cholesterol Cal: 20 mg/dL (ref 5–40)

## 2021-04-28 LAB — T4, FREE: Free T4: 1.71 ng/dL (ref 0.82–1.77)

## 2021-04-28 LAB — TSH: TSH: 0.905 u[IU]/mL (ref 0.450–4.500)

## 2021-04-28 LAB — VITAMIN B12: Vitamin B-12: 427 pg/mL (ref 232–1245)

## 2021-04-28 LAB — URIC ACID: Uric Acid: 9.1 mg/dL — ABNORMAL HIGH (ref 3.8–8.4)

## 2021-04-28 LAB — RPR: RPR Ser Ql: NONREACTIVE

## 2021-04-28 LAB — PSA: Prostate Specific Ag, Serum: 57.3 ng/mL — ABNORMAL HIGH (ref 0.0–4.0)

## 2021-04-30 ENCOUNTER — Encounter: Payer: Self-pay | Admitting: Internal Medicine

## 2021-04-30 ENCOUNTER — Telehealth: Payer: Self-pay | Admitting: Nurse Practitioner

## 2021-04-30 DIAGNOSIS — R972 Elevated prostate specific antigen [PSA]: Secondary | ICD-10-CM

## 2021-04-30 NOTE — Telephone Encounter (Signed)
Spoke to both Mr. Kobus and his wife via telephone, they had provider on speaker phone, to review recent lab results.  Discussed current kidney function, he does have referral to nephrology, but they have not heard from them yet.  PSA quite elevated, >50, discussed with them that this would need to be assessed by urology, could be from enlargement in prostate or other causes, including cancer.  Discussed at length with them and they agree with visit to urology.  Continues to have mild elevation in white blood cell count, discussed will recheck this next visit.  Uric acid with mild elevation, continue Allopurinol.  All questions answered at length.  Recommend they place wife as DPR, so she can also receive calls, Mr. Barlowe would like this added to chart.

## 2021-04-30 NOTE — Progress Notes (Signed)
Attempt to call, but husband (patient) answered and appears he is having some cognitive decline on review recent note.  He reported wife was out on road and should be back soon, I alerted him to have her call us along with him and will review labs -- she is not on DPR list, will discuss this.  Just FYI if she calls.

## 2021-04-30 NOTE — Telephone Encounter (Signed)
Verbal order given  

## 2021-04-30 NOTE — Progress Notes (Signed)
Review telephone note from 04/30/21.  Urology referral in.

## 2021-05-01 ENCOUNTER — Encounter: Payer: Self-pay | Admitting: Physician Assistant

## 2021-05-03 ENCOUNTER — Ambulatory Visit: Payer: Federal, State, Local not specified - PPO | Admitting: Urology

## 2021-05-03 ENCOUNTER — Encounter: Payer: Self-pay | Admitting: Urology

## 2021-05-03 ENCOUNTER — Other Ambulatory Visit: Payer: Self-pay

## 2021-05-03 VITALS — BP 132/76 | HR 87 | Ht 72.0 in | Wt 190.0 lb

## 2021-05-03 DIAGNOSIS — R972 Elevated prostate specific antigen [PSA]: Secondary | ICD-10-CM | POA: Diagnosis not present

## 2021-05-03 NOTE — Patient Instructions (Signed)
Prostate Cancer Screening  Prostate cancer screening is a test that is done to check for the presence of prostate cancer in men. The prostate gland is a walnut-sized gland that is located below the bladder and in front of the rectum in males. The function of the prostate is to add fluid to semen during ejaculation. Prostate cancer isthe second most common type of cancer in men. Who should have prostate cancer screening?  Screening recommendations vary based on age and other risk factors. Screening is recommended if: You are older than age 31. If you are age 15-69, talk with your health care provider about your need for screening and how often screening should be done. Because most prostate cancers are slow growing and will not cause death, screening is generally reserved in this age group for men who have a 10-15-year life expectancy. You are younger than age 59, and you have these risk factors: Being a black male or a male of African descent. Having a father, brother, or uncle who has been diagnosed with prostate cancer. The risk is higher if your family member's cancer occurred at an early age. Screening is not recommended if: You are younger than age 29. You are between the ages of 68 and 27 and you have no risk factors. You are 18 years of age or older. At this age, the risks that screening can cause are greater than the benefits that it may provide. If you are at high risk for prostate cancer, your health care provider may recommend that you have screenings more often or that you start screening at ayounger age. How is screening for prostate cancer done? The recommended prostate cancer screening test is a blood test called the prostate-specific antigen (PSA) test. PSA is a protein that is made in the prostate. As you age, your prostate naturally produces more PSA. Abnormally high PSA levels may be caused by: Prostate cancer. An enlarged prostate that is not caused by cancer (benign prostatic  hyperplasia, BPH). This condition is very common in older men. A prostate gland infection (prostatitis). Depending on the PSA results, you may need more tests, such as: A physical exam to check the size of your prostate gland. Blood and imaging tests. A procedure to remove tissue samples from your prostate gland for testing (biopsy). What are the benefits of prostate cancer screening? Screening can help to identify cancer at an early stage, before symptoms start and when the cancer can be treated more easily. There is a small chance that screening may lower your risk of dying from prostate cancer. The chance is small because prostate cancer is a slow-growing cancer, and most men with prostate cancer die from a different cause. What are the risks of prostate cancer screening? The main risk of prostate cancer screening is diagnosing and treating prostate cancer that would never have caused any symptoms or problems. This is called overdiagnosisand overtreatment. PSA screening cannot tell you if your PSA is high due to cancer or a different cause. A prostate biopsy is the only procedure to diagnose prostate cancer. Even the results of a biopsy may not tell you if your cancer needs to be treated. Slow-growing prostate cancer may not need any treatment other than monitoring, so diagnosing and treating it may causeunnecessary stress or other side effects. A prostate biopsy may also cause: Infection or fever. A false negative. This is a result that shows that you do not have prostate cancer when you actually do have prostate cancer. Questions to  ask your health care provider When should I start prostate cancer screening? What is my risk for prostate cancer? How often do I need screening? What type of screening tests do I need? How do I get my test results? What do my results mean? Do I need treatment? Where to find more information The American Cancer Society: www.cancer.org American Urological  Association: www.auanet.org Contact a health care provider if: You have difficulty urinating. You have pain when you urinate or ejaculate. You have blood in your urine or semen. You have pain in your back or in the area of your prostate. Summary Prostate cancer is a common type of cancer in men. The prostate gland is located below the bladder and in front of the rectum. This gland adds fluid to semen during ejaculation. Prostate cancer screening may identify cancer at an early stage, when the cancer can be treated more easily. The prostate-specific antigen (PSA) test is the recommended screening test for prostate cancer. Discuss the risks and benefits of prostate cancer screening with your health care provider. If you are age 18 or older, the risks that screening can cause are greater than the benefits that it may provide. This information is not intended to replace advice given to you by your health care provider. Make sure you discuss any questions you have with your healthcare provider. Document Revised: 08/31/2020 Document Reviewed: 04/15/2019 Elsevier Patient Education  Jason Estrada.

## 2021-05-03 NOTE — Progress Notes (Signed)
   05/03/21 1:25 PM   Jason Estrada Jun 22, 1941 [id number]  CC: Elevated PSA  HPI: I saw Jason Estrada and his wife in clinic today for evaluation of an elevated PSA.  She provides the majority of the history.  His past medical history is notable for some dementia, hypertension, and CKD with recent creatinine of 2.1, EGFR of 32.  He was recently hospitalized for gout/infection of his hand, and a week later a PSA was checked and was significantly elevated at 57.3.  There are no prior PSA values to review.  He denies any significant urinary symptoms at baseline, and no gross hematuria.  He has nocturia 0-1 time per night.  No history of UTIs.  Denies any family history of prostate or breast cancer.  Urinalysis was checked at time of PSA value with his PCP and was also benign.  I reviewed the inpatient records as well as the referral from his PCP.    PMH: Past Medical History:  Diagnosis Date   Dementia ([place])    Gout    Hypertension    Renal disorder     Surgical History: Past Surgical History:  Procedure Laterality Date   CHOLECYSTECTOMY     Social History:  reports that he has never smoked. He has never used smokeless tobacco. He reports that he does not currently use alcohol. He reports that he does not currently use drugs.  Physical Exam: BP 132/76   Pulse 87   Ht 6' (1.829 m)   Wt 190 lb (86.2 kg)   BMI 25.77 kg/m    Constitutional:  Alert and oriented, No acute distress. Cardiovascular: No clubbing, cyanosis, or edema. Respiratory: Normal respiratory effort, no increased work of breathing. GI: Abdomen is soft, nontender, nondistended, no abdominal masses DRE: 50 g, smooth, no nodules or masses  Laboratory Data: Reviewed, see HPI  Assessment & Plan:   80 year old male recently admitted with suspected gout and possible joint infection in his hand with a PSA checked 1 week later that was elevated at 57.3.  No prior values to review.  DRE without any abnormalities  today.  We reviewed the implications of an elevated PSA and the uncertainty surrounding it. In general, a man's PSA increases with age and is produced by both normal and cancerous prostate tissue. The differential diagnosis for elevated PSA includes BPH, prostate cancer, infection, recent intercourse/ejaculation, recent urethroscopic manipulation (foley placement/cystoscopy) or trauma, and prostatitis.   Management of an elevated PSA can include observation or prostate biopsy and we discussed this in detail. Our goal is to detect clinically significant prostate cancers, and manage with either active surveillance, surgery, or radiation for localized disease. Risks of prostate biopsy include bleeding, infection (including life threatening sepsis), pain, and lower urinary symptoms. Hematuria, hematospermia, and blood in the stool are all common after biopsy and can persist up to 4 weeks.   We discussed the complexities of PSA management in patients with a number of co-morbidities and outside the age of routine screening.  With his recent hospital admission and gout/infection and benign DRE, I recommended repeating the PSA in 3 to 4 weeks prior to any other aggressive work-up with biopsy or imaging.  RTC 3 to 4 weeks repeat PSA If downtrending, follow to normalization If remains significantly elevated, consider prostate biopsy or CT to evaluate for metastatic disease  Nickolas Madrid, MD 05/03/2021  [facility] [address] [place], [place] [address] [phone]

## 2021-05-07 ENCOUNTER — Telehealth: Payer: Self-pay | Admitting: Internal Medicine

## 2021-05-07 NOTE — Telephone Encounter (Signed)
Esther with [facility] calling to ask if OK to do the OT eval this week instead of last week?  Cb [phone] She states urgent to hear back because she has an opening tomorrow.

## 2021-05-07 NOTE — Telephone Encounter (Signed)
Returned call to [employer] with [facility] and stated per [facility] that it was ok for patient to have his OT eval this week instead of next week.

## 2021-05-09 ENCOUNTER — Telehealth: Payer: Self-pay | Admitting: Internal Medicine

## 2021-05-09 NOTE — Telephone Encounter (Signed)
Home Health Verbal Orders - Caller/Agency: [facility] Number: [phone]  Requesting OT/PT/Skilled Nursing/Social Work/Speech Therapy: OT   Frequency: 1w1 2w1 2w1

## 2021-05-10 NOTE — Telephone Encounter (Signed)
Left message for Sherlynn Stalls from [place] providing OK for verbals order per [facility] on behalf Dr.[name]. Arvil Chaco to give our office a call back if she has any questions or concerns.

## 2021-05-15 ENCOUNTER — Other Ambulatory Visit: Payer: Self-pay | Admitting: Internal Medicine

## 2021-05-15 NOTE — Telephone Encounter (Signed)
Pts wife calling on behalf of pt. She states that the pt only has one pill left of the hydralazine. Pt is no longer seeing the original provider who prescribed this. Please advise.

## 2021-05-15 NOTE — Telephone Encounter (Signed)
Medication Refill - Medication: Hydralazine, Verapamil  Has the patient contacted their pharmacy? No. Pts wife calling on behalf of pt. She states that the pt only has one pill left of the hydralazine. Pt is no longer seeing the original provider who prescribed this. Please advise.  (Agent: If no, request that the patient contact the pharmacy for the refill.) (Agent: If yes, when and what did the pharmacy advise?)  Preferred Pharmacy (with phone number or street name):  [id number] - [place], [address]  [address] [place] [address]  Phone: [phone] Fax: [phone]  Hours: Not open 24 hours   Agent: Please be advised that RX refills may take up to 3 business days. We ask that you follow-up with your pharmacy.

## 2021-05-15 NOTE — Telephone Encounter (Signed)
Requested medication (s) are due for refill today: historical medication  Requested medication (s) are on the active medication list: yes   Last refill:  na   Future visit scheduled: yes in 2 weeks  Notes to clinic:  historical medication. Do you want to order Rx?      Requested Prescriptions  Pending Prescriptions Disp Refills   hydrALAZINE (APRESOLINE) 50 MG tablet      Sig: Take 1 tablet (50 mg total) by mouth 3 (three) times daily.     Cardiovascular:  Vasodilators Failed - 05/15/2021  5:21 PM      Failed - HGB in normal range and within 360 days    Hemoglobin  Date Value Ref Range Status  04/27/2021 12.5 (L) 13.0 - 17.7 g/dL Final          Failed - WBC in normal range and within 360 days    WBC  Date Value Ref Range Status  04/27/2021 14.0 (H) 3.4 - 10.8 x10E3/uL Final  04/23/2021 9.9 4.0 - 10.5 K/uL Final          Passed - HCT in normal range and within 360 days    Hematocrit  Date Value Ref Range Status  04/27/2021 39.6 37.5 - 51.0 % Final          Passed - RBC in normal range and within 360 days    RBC  Date Value Ref Range Status  04/27/2021 4.30 4.14 - 5.80 x10E6/uL Final  04/23/2021 3.63 (L) 4.22 - 5.81 MIL/uL Final          Passed - PLT in normal range and within 360 days    Platelets  Date Value Ref Range Status  04/27/2021 403 150 - 450 x10E3/uL Final          Passed - Last BP in normal range    BP Readings from Last 1 Encounters:  05/03/21 132/76          Passed - Valid encounter within last 12 months    Recent Outpatient Visits           2 weeks ago Stage 4 chronic kidney disease ([place])   [facility] [name], [name], MD       Future Appointments             In 2 weeks [name], [name], MD [facility]

## 2021-05-16 ENCOUNTER — Ambulatory Visit: Payer: Federal, State, Local not specified - PPO | Admitting: Urology

## 2021-05-16 MED ORDER — HYDRALAZINE HCL 50 MG PO TABS: 50.0000 mg | ORAL_TABLET | Freq: Three times a day (TID) | ORAL | 12 refills | Status: DC

## 2021-05-16 NOTE — Telephone Encounter (Signed)
Patient last seen 04/27/21 and has appointment 05/30/21.

## 2021-05-23 ENCOUNTER — Ambulatory Visit: Payer: Federal, State, Local not specified - PPO | Admitting: Urology

## 2021-05-24 ENCOUNTER — Other Ambulatory Visit: Payer: Federal, State, Local not specified - PPO

## 2021-05-24 ENCOUNTER — Other Ambulatory Visit: Payer: Self-pay

## 2021-05-24 DIAGNOSIS — R972 Elevated prostate specific antigen [PSA]: Secondary | ICD-10-CM

## 2021-05-25 ENCOUNTER — Telehealth: Payer: Self-pay

## 2021-05-25 DIAGNOSIS — R972 Elevated prostate specific antigen [PSA]: Secondary | ICD-10-CM

## 2021-05-25 LAB — PSA: Prostate Specific Ag, Serum: 41.1 ng/mL — ABNORMAL HIGH (ref 0.0–4.0)

## 2021-05-25 NOTE — Telephone Encounter (Signed)
Called pt no answer. Left detailed message per DPR informing him of the information below. Advised pt to call back for questions or concerns. PSA ordered. Appt scheduled.

## 2021-05-25 NOTE — Telephone Encounter (Signed)
-----   Message from Billey Co, MD sent at 05/25/2021 10:09 AM EDT ----- PSA has decreased significantly to 41 from 57.  Would recommend repeat PSA in 6 weeks to confirm downtrending, with clinic visit a few days later to discuss  Nickolas Madrid, MD 05/25/2021

## 2021-05-28 ENCOUNTER — Other Ambulatory Visit: Payer: Self-pay | Admitting: Nephrology

## 2021-05-28 DIAGNOSIS — I10 Essential (primary) hypertension: Secondary | ICD-10-CM | POA: Insufficient documentation

## 2021-05-28 DIAGNOSIS — N1832 Chronic kidney disease, stage 3b: Secondary | ICD-10-CM

## 2021-05-28 HISTORY — DX: Essential (primary) hypertension: I10

## 2021-05-30 ENCOUNTER — Ambulatory Visit: Payer: Federal, State, Local not specified - PPO | Admitting: Urology

## 2021-05-30 ENCOUNTER — Telehealth: Payer: Self-pay | Admitting: Internal Medicine

## 2021-05-30 ENCOUNTER — Ambulatory Visit: Payer: Federal, State, Local not specified - PPO | Admitting: Internal Medicine

## 2021-05-30 NOTE — Telephone Encounter (Signed)
Home Health Verbal Orders - Caller[facility]ency: [facility] Callback Number: [phone] Requesting Speech Therapy:  Frequency:1x4

## 2021-05-30 NOTE — Telephone Encounter (Signed)
Spoke with wife and advised results, pt is having some cognitive issues going on and per wife she is not that familiar with mychart. I gave wife appts times and dates for labs and OV.

## 2021-05-30 NOTE — Telephone Encounter (Signed)
Called pt, no answer left second detailed message. Mychart message also sent.

## 2021-05-31 NOTE — Telephone Encounter (Signed)
Left message for Anderson Malta from [facility] providing verbal orders from [place] for patient speech therapy. Jason Estrada to give our office a call back if she has any questions or concerns regarding the patient.

## 2021-06-01 ENCOUNTER — Encounter: Payer: Self-pay | Admitting: Physician Assistant

## 2021-06-01 ENCOUNTER — Ambulatory Visit: Payer: Federal, State, Local not specified - PPO | Admitting: Physician Assistant

## 2021-06-01 ENCOUNTER — Other Ambulatory Visit: Payer: Self-pay

## 2021-06-01 VITALS — BP 123/58 | HR 62 | Resp 20 | Ht 72.0 in | Wt 193.0 lb

## 2021-06-01 DIAGNOSIS — R413 Other amnesia: Secondary | ICD-10-CM

## 2021-06-01 MED ORDER — DONEPEZIL HCL 10 MG PO TABS: ORAL_TABLET | ORAL | 11 refills | Status: DC

## 2021-06-01 NOTE — Patient Instructions (Addendum)
It was a pleasure to see you today at our office.   Recommendations:  Follow up  after the results are available  Neurocognitive evaluation at our office MRI of the brain, the radiology office will call you to arrange you appointment We will start donepezil half tablet (5mg ) daily for 2  weeks.  If you are tolerating the medication, then after 2 weeks, we will increase the dose to a full tablet of 10 mg daily.  Side effects include nausea, vomiting, diarrhea, vivid dreams, and muscle cramps.  Please call the clinic if you experience any of these symptoms.    RECOMMENDATIONS FOR ALL PATIENTS WITH MEMORY PROBLEMS: 1. Continue to exercise (Recommend 30 minutes of walking everyday, or 3 hours every week) 2. Increase social interactions - continue going to [place] and enjoy social gatherings with friends and family 3. Eat healthy, avoid fried foods and eat more fruits and vegetables 4. Maintain adequate blood pressure, blood sugar, and blood cholesterol level. Reducing the risk of stroke and cardiovascular disease also helps promoting better memory. 5. Avoid stressful situations. Live a simple life and avoid aggravations. Organize your time and prepare for the next day in anticipation. 6. Sleep well, avoid any interruptions of sleep and avoid any distractions in the bedroom that may interfere with adequate sleep quality 7. Avoid sugar, avoid sweets as there is a strong link between excessive sugar intake, diabetes, and cognitive impairment We discussed the Mediterranean diet, which has been shown to help patients reduce the risk of progressive memory disorders and reduces cardiovascular risk. This includes eating fish, eat fruits and green leafy vegetables, nuts like almonds and hazelnuts, walnuts, and also use olive oil. Avoid fast foods and fried foods as much as possible. Avoid sweets and sugar as sugar use has been linked to worsening of memory function.  There is always a concern of gradual  progression of memory problems. If this is the case, then we may need to adjust level of care according to patient needs. Support, both to the patient and caregiver, should then be put into place.      You have been referred for a neuropsychological evaluation (i.e., evaluation of memory and thinking abilities). Please bring someone with you to this appointment if possible, as it is helpful for the doctor to hear from both you and another adult who knows you well. Please bring eyeglasses and hearing aids if you wear them.    The evaluation will take approximately 3 hours and has two parts:   The first part is a clinical interview with the neuropsychologist (Dr. Melvyn Novas or Dr. Nicole Kindred). During the interview, the neuropsychologist will speak with you and the individual you brought to the appointment.    The second part of the evaluation is testing with the doctor's technician Hinton Dyer or Maudie Mercury). During the testing, the technician will ask you to remember different types of material, solve problems, and answer some questionnaires. Your family member will not be present for this portion of the evaluation.   Please note: We must reserve several hours of the neuropsychologist's time and the psychometrician's time for your evaluation appointment. As such, there is a No-Show fee of $100. If you are unable to attend any of your appointments, please contact our office as soon as possible to reschedule.    FALL PRECAUTIONS: Be cautious when walking. Scan the area for obstacles that may increase the risk of trips and falls. When getting up in the mornings, sit up at the edge of  the bed for a few minutes before getting out of bed. Consider elevating the bed at the head end to avoid drop of blood pressure when getting up. Walk always in a well-lit room (use night lights in the walls). Avoid area rugs or power cords from appliances in the middle of the walkways. Use a walker or a cane if necessary and consider physical  therapy for balance exercise. Get your eyesight checked regularly.  FINANCIAL OVERSIGHT: Supervision, especially oversight when making financial decisions or transactions is also recommended.  HOME SAFETY: Consider the safety of the kitchen when operating appliances like stoves, microwave oven, and blender. Consider having supervision and share cooking responsibilities until no longer able to participate in those. Accidents with firearms and other hazards in the house should be identified and addressed as well.   ABILITY TO BE LEFT ALONE: If patient is unable to contact 911 operator, consider using LifeLine, or when the need is there, arrange for someone to stay with patients. Smoking is a fire hazard, consider supervision or cessation. Risk of wandering should be assessed by caregiver and if detected at any point, supervision and safe proof recommendations should be instituted.  MEDICATION SUPERVISION: Inability to self-administer medication needs to be constantly addressed. Implement a mechanism to ensure safe administration of the medications.   DRIVING: Regarding driving, in patients with progressive memory problems, driving will be impaired. We advise to have someone else do the driving if trouble finding directions or if minor accidents are reported. Independent driving assessment is available to determine safety of driving.   If you are interested in the driving assessment, you can contact the following:  [employer] in [place]  [place] [phone] or [phone]    [place] refers to food and lifestyle choices that are based on the traditions of countries located on the [employer]. This way of eating has been shown to help prevent certain conditions and improve outcomes for people who have chronic diseases, like kidney  disease and heart disease. What are tips for following this plan? Lifestyle  Cook and eat meals together with your family, when possible. Drink enough fluid to keep your urine clear or pale yellow. Be physically active every day. This includes: Aerobic exercise like running or swimming. Leisure activities like gardening, walking, or housework. Get 7-8 hours of sleep each night. If recommended by your health care provider, drink red wine in moderation. This means 1 glass a day for nonpregnant women and 2 glasses a day for men. A glass of wine equals 5 oz (150 mL). Reading food labels  Check the serving size of packaged foods. For foods such as rice and pasta, the serving size refers to the amount of cooked product, not dry. Check the total fat in packaged foods. Avoid foods that have saturated fat or trans fats. Check the ingredients list for added sugars, such as corn syrup. Shopping  At the grocery store, buy most of your food from the areas near the walls of the store. This includes: Fresh fruits and vegetables (produce). Grains, beans, nuts, and seeds. Some of these may be available in unpackaged forms or large amounts (in bulk). Fresh seafood. Poultry and eggs. Low-fat dairy products. Buy whole ingredients instead of prepackaged foods. Buy fresh fruits and vegetables in-season from local farmers markets. Buy frozen fruits and vegetables in resealable bags. If you do not have access to quality fresh  seafood, buy precooked frozen shrimp or canned fish, such as tuna, salmon, or sardines. Buy small amounts of raw or cooked vegetables, salads, or olives from the deli or salad bar at your store. Stock your pantry so you always have certain foods on hand, such as olive oil, canned tuna, canned tomatoes, rice, pasta, and beans. Cooking  Cook foods with extra-virgin olive oil instead of using butter or other vegetable oils. Have meat as a side dish, and have vegetables or grains as your main  dish. This means having meat in small portions or adding small amounts of meat to foods like pasta or stew. Use beans or vegetables instead of meat in common dishes like chili or lasagna. Experiment with different cooking methods. Try roasting or broiling vegetables instead of steaming or sauteing them. Add frozen vegetables to soups, stews, pasta, or rice. Add nuts or seeds for added healthy fat at each meal. You can add these to yogurt, salads, or vegetable dishes. Marinate fish or vegetables using olive oil, lemon juice, garlic, and fresh herbs. Meal planning  Plan to eat 1 vegetarian meal one day each week. Try to work up to 2 vegetarian meals, if possible. Eat seafood 2 or more times a week. Have healthy snacks readily available, such as: Vegetable sticks with hummus. Greek yogurt. Fruit and nut trail mix. Eat balanced meals throughout the week. This includes: Fruit: 2-3 servings a day Vegetables: 4-5 servings a day Low-fat dairy: 2 servings a day Fish, poultry, or lean meat: 1 serving a day Beans and legumes: 2 or more servings a week Nuts and seeds: 1-2 servings a day Whole grains: 6-8 servings a day Extra-virgin olive oil: 3-4 servings a day Limit red meat and sweets to only a few servings a month What are my food choices? Mediterranean diet Recommended Grains: Whole-grain pasta. Brown rice. Bulgar wheat. Polenta. Couscous. Whole-wheat bread. [place]. Vegetables: Artichokes. Beets. Broccoli. Cabbage. Carrots. Eggplant. Green beans. Chard. Kale. Spinach. Onions. Leeks. Peas. Squash. Tomatoes. Peppers. Radishes. Fruits: Apples. Apricots. Avocado. Berries. Bananas. Cherries. Dates. Figs. Grapes. Lemons. Melon. Oranges. Peaches. Plums. Pomegranate. Meats and other protein foods: Beans. Almonds. Sunflower seeds. Pine nuts. Peanuts. [place]. Salmon. Scallops. Shrimp. [place]. Tilapia. Clams. Oysters. Eggs. Dairy: Low-fat milk. Cheese. Greek yogurt. Beverages: Water. Red wine.  Herbal tea. Fats and oils: Extra virgin olive oil. Avocado oil. Grape seed oil. Sweets and desserts: [place] yogurt with honey. Baked apples. Poached pears. Trail mix. Seasoning and other foods: Basil. Cilantro. Coriander. Cumin. Mint. Parsley. Sage. Rosemary. Tarragon. Garlic. Oregano. Thyme. Pepper. Balsalmic vinegar. Tahini. Hummus. Tomato sauce. Olives. Mushrooms. Limit these Grains: Prepackaged pasta or rice dishes. Prepackaged cereal with added sugar. Vegetables: Deep fried potatoes (french fries). Fruits: Fruit canned in syrup. Meats and other protein foods: Beef. Pork. Lamb. Poultry with skin. Hot dogs. [place] Salines. Dairy: Ice cream. Sour cream. Whole milk. Beverages: Juice. Sugar-sweetened soft drinks. Beer. Liquor and spirits. Fats and oils: Butter. Canola oil. Vegetable oil. Beef fat (tallow). Lard. Sweets and desserts: Cookies. Cakes. Pies. Candy. Seasoning and other foods: Mayonnaise. Premade sauces and marinades. The items listed may not be a complete list. Talk with your dietitian about what dietary choices are right for you. Summary The Mediterranean diet includes both food and lifestyle choices. Eat a variety of fresh fruits and vegetables, beans, nuts, seeds, and whole grains. Limit the amount of red meat and sweets that you eat. Talk with your health care provider about whether it is safe for you to drink red wine in  moderation. This means 1 glass a day for nonpregnant women and 2 glasses a day for men. A glass of wine equals 5 oz (150 mL). This information is not intended to replace advice given to you by your health care provider. Make sure you discuss any questions you have with your health care provider. Document Released: 04/25/2016 Document Revised: 05/28/2016 Document Reviewed: 04/25/2016 Elsevier Interactive Patient Education  2017 [employer].

## 2021-06-01 NOTE — Progress Notes (Addendum)
Assessment/Plan:   Kevaughn Ewing is a 80 y.o. year old male with risk factors including  age, hypertension, hyperlipidemia, stage IIIa CKD, seen today for evaluation of memory loss. MoCA today is 17/30 with deficiencies in visuospatial/executive, naming, memory, attention, language, abstraction, delayed recall  0/5, orientation 4/6    Recommendations:   Mild to moderate dementia  MRI brain without contrast to assess for underlying structural abnormality and assess vascular load  Neurocognitive testing to further evaluate cognitive concerns and determine underlying cause of memory changes, including potential contribution from sleep, anxiety, or depression   Start Donepezil Take half tablet (5 mg) daily for 2 weeks, then increase to the full tablet at 10 mg daily   Discussed safety both in and out of the home.  Discussed the importance of regular daily schedule with inclusion of crossword puzzles to maintain brain function.  Continue to monitor mood with PCP.  Stay active at least 30 minutes at least 3 times a week.  Naps should be scheduled and should be no longer than 60 minutes and should not occur after 2 PM.  Mediterranean diet is recommended  Folllow up once results above are available   Subjective:    The patient is seen in neurologic consultation at the request of [name], [name], MD for the evaluation of memory.  The patient is accompanied by his wife who supplements the history.  52 y.o. year old male who has had memory issues for about 1 year when he noticed worsening short term memory deficiencies. "He pauses till he can find the word" -wife says.  He misses appointments frequently. He moved from [place] 1 year ago, and his wife would travel from there to [place] during this time, able to see these changes. "This was worse until 4 weeks ago", somehow it is a little bit better but not much". He does repeat the same stories and questions. He denies depression or irritability. His  mood is "good". He  likes to play solitaire on his phone, trivia, reading and walking. He sleeps better than when first moving, He has some vivid dreams without  sleepwalking. At times he confuses the days and nights. "He wanted to eat breakfast at midnight".  In July he was confused, found to have elevated PSA and was referred to a urologist, workup ongoing. He has some urine hesitancy because of prostatic issues. Currently denies hallucinations or paranoia. Denies leaving objects in unusual places.  Wife reports issues with hygiene."Bathing since around July, is a battle, and he likes to use the same underwear". Wife administers the medications and manages the bills, because he fell victim to scams. His appetite is good and denies trouble swallowing, He cooks and denies leaving the stove on. Wife supervises . He ambulates without devices, but is prone to falls due to gout. Denies head injuries.   He drives with GPS and wife is concerned because he relies on it and "still gets lost, took me to the wrong place the other day". He has issues with time gap as well. He believes that he has been in  longer, no sense of when and where he is".  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 Recently has  worked for [employer]. Denies headaches, double vision, dizziness, focal numbness or tingling, unilateral weakness or tremors.  No constipation or diarrhea. Denies anosmia. Denies history of OSA, ETOH or Tobacco. Family History negative for dementia.   Labs on 04/22/2021 CBC with hemoglobin  12.3/hematocrit 37.9 (in the setting of CKD) otherwise normal Creatinine 2.7 (CKD) Uric acid 9 (gout) B12 427 Folate RPR TSH 0.905 T4 1.71 RPR non reactive PSA 41.1 (has been referred to urology)  Lipid panel normal  No Known Allergies  Current Outpatient Medications  Medication Instructions   allopurinol (ZYLOPRIM) 100 mg, Oral, Daily   baclofen (LIORESAL) 10 mg, Oral, Daily    colchicine 0.6 mg, Oral, Daily   folic acid (FOLVITE) 1 mg, Oral, Daily   hydrALAZINE (APRESOLINE) 50 mg, Oral, 3 times daily   neomycin-bacitracin-polymyxin (NEOSPORIN) ointment 1 application, Topical, Every 12 hours   pantoprazole (PROTONIX) 20 mg, Oral, Daily   pravastatin (PRAVACHOL) 40 mg, Oral, Daily   predniSONE (DELTASONE) 20 MG tablet Take 40 mg (2 tablets) from 8/9 to 8/13, then 20 mg (1 tablet) from 8/14 to 8/18, then 10 mg (1/2 tablet) from 8/19 to 05/08/21, then done.   valsartan-hydrochlorothiazide (DIOVAN-HCT) 320-25 MG tablet Hold until followup with your outpatient doctor due to your low normal blood pressure.   verapamil (CALAN-SR) 240 mg, Oral, Daily     VITALS:   Vitals:   06/01/21 0933  BP: (!) 123/58  Pulse: 62  Resp: 20  SpO2: 100%  Weight: 193 lb (87.5 kg)  Height: 6' (1.829 m)      PHYSICAL EXAM   HEENT:  Normocephalic, atraumatic. The mucous membranes are moist. The superficial temporal arteries are without ropiness or tenderness. Cardiovascular: Regular rate and rhythm. Lungs: Clear to auscultation bilaterally. Neck: There are no carotid bruits noted bilaterally.  NEUROLOGICAL: [place] Cognitive Assessment  06/01/2021  Visuospatial/ Executive (0/5) 4  Naming (0/3) 3  Attention: Read list of digits (0/2) 2  Attention: Read list of letters (0/1) 0  Attention: Serial 7 subtraction starting at 100 (0/3) 1  Language: Repeat phrase (0/2) 2  Language : Fluency (0/1) 0  Abstraction (0/2) 0  Delayed Recall (0/5) 0  Orientation (0/6) 4  Total 16  Adjusted Score (based on education) 17   No flowsheet data found.  No flowsheet data found.   Orientation:  Alert and oriented to person, place and time. No aphasia or dysarthria. Fund of knowledge is appropriate. Recent memory impaired and remote memory intact.  Attention and concentration are normal.  Able to name objects and repeat phrases. Delayed recall  0/5 Cranial nerves: There is good facial symmetry.  Extraocular muscles are intact and visual fields are full to confrontational testing. Speech is fluent and clear. Soft palate rises symmetrically and there is no tongue deviation. Hearing is intact to conversational tone. Tone: Tone is good throughout. Sensation: Sensation is intact to light touch and pinprick throughout. Vibration is intact at the bilateral big toe.There is no extinction with double simultaneous stimulation. There is no sensory dermatomal level identified. Coordination: The patient has no difficulty with RAM's or FNF bilaterally. Normal finger to nose  Motor: Strength is 5/5 in the bilateral upper and lower extremities. There is no pronator drift. There are no fasciculations noted. DTR's: Deep tendon reflexes are 2/4 at the bilateral biceps, triceps, brachioradialis, patella and achilles.  Plantar responses are downgoing bilaterally. Gait and Station: The patient is able to ambulate without difficulty.The patient is able to heel toe walk without any difficulty.The patient is able to ambulate in a tandem fashion. The patient is able to stand in the Romberg position.     Thank you for allowing [place] the opportunity to participate in the care of this nice patient. Please do not hesitate to  contact us for any questions or concerns.   Total time spent on today's visit was 60 minutes, including both face-to-face time and nonface-to-face time.  Time included that spent on review of records (prior notes available to me/labs/imaging if pertinent), discussing treatment and goals, answering patient's questions and coordinating care.  Cc:  Charlynne Cousins, MD  Sharene Butters 06/01/2021 10:17 AM

## 2021-06-07 ENCOUNTER — Ambulatory Visit: Payer: Federal, State, Local not specified - PPO | Admitting: Internal Medicine

## 2021-06-11 ENCOUNTER — Other Ambulatory Visit: Payer: Self-pay

## 2021-06-11 ENCOUNTER — Ambulatory Visit
Admission: RE | Admit: 2021-06-11 | Discharge: 2021-06-11 | Disposition: A | Discharge status: Home / Self Care | Payer: Federal, State, Local not specified - PPO | Source: Ambulatory Visit | Attending: Nephrology | Admitting: Nephrology

## 2021-06-11 DIAGNOSIS — N1832 Chronic kidney disease, stage 3b: Secondary | ICD-10-CM | POA: Diagnosis present

## 2021-06-12 ENCOUNTER — Other Ambulatory Visit: Payer: Self-pay

## 2021-06-12 ENCOUNTER — Encounter: Payer: Self-pay | Admitting: Internal Medicine

## 2021-06-12 ENCOUNTER — Ambulatory Visit: Payer: Federal, State, Local not specified - PPO | Admitting: Internal Medicine

## 2021-06-12 VITALS — BP 156/68 | HR 76 | Temp 98.4°F | Ht 72.01 in | Wt 193.2 lb

## 2021-06-12 DIAGNOSIS — M109 Gout, unspecified: Secondary | ICD-10-CM

## 2021-06-12 DIAGNOSIS — M1A9XX1 Chronic gout, unspecified, with tophus (tophi): Secondary | ICD-10-CM

## 2021-06-12 DIAGNOSIS — R972 Elevated prostate specific antigen [PSA]: Secondary | ICD-10-CM

## 2021-06-12 LAB — CBC WITH DIFFERENTIAL/PLATELET
Hematocrit: 41.9 % (ref 37.5–51.0)
Hemoglobin: 13.4 g/dL (ref 13.0–17.7)
Lymphocytes Absolute: 1.5 10*3/uL (ref 0.7–3.1)
Lymphs: 20 %
MCH: 30.1 pg (ref 26.6–33.0)
MCHC: 32 g/dL (ref 31.5–35.7)
MCV: 94 fL (ref 79–97)
MID (Absolute): 0.8 10*3/uL (ref 0.1–1.6)
MID: 10 %
Neutrophils Absolute: 5.2 10*3/uL (ref 1.4–7.0)
Neutrophils: 70 %
Platelets: 256 10*3/uL (ref 150–450)
RBC: 4.45 x10E6/uL (ref 4.14–5.80)
RDW: 14.6 % (ref 11.6–15.4)
WBC: 7.5 10*3/uL (ref 3.4–10.8)

## 2021-06-12 MED ORDER — COLCHICINE 0.6 MG PO TABS: 0.6000 mg | ORAL_TABLET | Freq: Every day | ORAL | 3 refills | Status: DC

## 2021-06-12 NOTE — Progress Notes (Signed)
BP (!) 156/68   Pulse 76   Temp 98.4 F (36.9 C) (Oral)   Ht 6' 0.01" (1.829 m)   Wt 193 lb 3.2 oz (87.6 kg)   SpO2 98%   BMI 26.20 kg/m    Subjective:    Patient ID: Jason Estrada, male    DOB: 1941-02-04, 80 y.o.   MRN: [id number]  Chief Complaint  Patient presents with   Elevated PSA   Chronic Kidney Disease    HPI: Jason Estrada is a 80 y.o. male  Dementia worsening and seen  neruo since last visit, was placed on aricept per neurology - per wife is a little better since then. To have furhter testing in the next couple months.  Hand swelling from gout improving Knee injury/ wound healed s/p using mupirocin   Elevated PSA Seen urology for such PSA dropped some    Arthritis Presents for follow-up (pt has gout is on allopurinol. uric acid levels at 9 last visit with an elevated wbc count) visit.   Chief Complaint  Patient presents with   Elevated PSA   Chronic Kidney Disease    Relevant past medical, surgical, family and social history reviewed and updated as indicated. Interim medical history since our last visit reviewed. Allergies and medications reviewed and updated.  Review of Systems  Musculoskeletal:  Positive for arthritis.   Per HPI unless specifically indicated above     Objective:    BP (!) 156/68   Pulse 76   Temp 98.4 F (36.9 C) (Oral)   Ht 6' 0.01" (1.829 m)   Wt 193 lb 3.2 oz (87.6 kg)   SpO2 98%   BMI 26.20 kg/m   Wt Readings from Last 3 Encounters:  06/12/21 193 lb 3.2 oz (87.6 kg)  06/01/21 193 lb (87.5 kg)  05/03/21 190 lb (86.2 kg)    Physical Exam Vitals and nursing note reviewed.  Constitutional:      General: He is not in acute distress.    Appearance: Normal appearance. He is not ill-appearing or diaphoretic.  HENT:     Head: Normocephalic and atraumatic.     Right Ear: Tympanic membrane and external ear normal. There is no impacted cerumen.     Left Ear: External ear normal.     Nose: No congestion or rhinorrhea.      Mouth/Throat:     Pharynx: No oropharyngeal exudate or posterior oropharyngeal erythema.  Eyes:     Conjunctiva/sclera: Conjunctivae normal.     Pupils: Pupils are equal, round, and reactive to light.  Cardiovascular:     Rate and Rhythm: Normal rate and regular rhythm.     Heart sounds: No murmur heard.   No friction rub. No gallop.  Pulmonary:     Effort: No respiratory distress.     Breath sounds: No stridor. No wheezing or rhonchi.  Chest:     Chest wall: No tenderness.  Abdominal:     General: Abdomen is flat. Bowel sounds are normal.     Palpations: Abdomen is soft. There is no mass.     Tenderness: There is no abdominal tenderness.  Musculoskeletal:        General: No swelling, tenderness, deformity or signs of injury.     Cervical back: Normal range of motion and neck supple. No rigidity or tenderness.     Right lower leg: No edema.     Left lower leg: No edema.     Comments: Scarring n right knee much improved after  abx had an open wound last visit.  Skin:    General: Skin is warm and dry.  Neurological:     Mental Status: He is alert.   Results for orders placed or performed in visit on 05/24/21  PSA  Result Value Ref Range   Prostate Specific Ag, Serum 41.1 (H) 0.0 - 4.0 ng/mL        Current Outpatient Medications:    allopurinol (ZYLOPRIM) 100 MG tablet, Take 100 mg by mouth daily., Disp: , Rfl:    baclofen (LIORESAL) 10 MG tablet, Take 10 mg by mouth daily., Disp: , Rfl:    donepezil (ARICEPT) 10 MG tablet, Take half tablet (5 mg) daily for 2 weeks, then increase to the full tablet at 10 mg daily, Disp: 30 tablet, Rfl: 11   folic acid (FOLVITE) 1 MG tablet, Take 1 mg by mouth daily., Disp: , Rfl:    hydrALAZINE (APRESOLINE) 50 MG tablet, Take 1 tablet (50 mg total) by mouth 3 (three) times daily., Disp: 90 tablet, Rfl: 12   neomycin-bacitracin-polymyxin (NEOSPORIN) ointment, Apply 1 application topically every 12 (twelve) hours., Disp: 15 g, Rfl: 2    pantoprazole (PROTONIX) 20 MG tablet, Take 20 mg by mouth daily., Disp: , Rfl:    pravastatin (PRAVACHOL) 40 MG tablet, Take 40 mg by mouth daily., Disp: , Rfl:    valsartan-hydrochlorothiazide (DIOVAN-HCT) 320-25 MG tablet, Hold until followup with your outpatient doctor due to your low normal blood pressure., Disp: , Rfl:    verapamil (CALAN-SR) 240 MG CR tablet, Take 240 mg by mouth daily., Disp: , Rfl:    colchicine 0.6 MG tablet, Take 1 tablet (0.6 mg total) by mouth daily., Disp: 30 tablet, Rfl: 3    Assessment & Plan:  Elevated PSA Seen urology for such PSA dropped some  To fu with urology asap x 1 month.  DRE was done @ urology was normal.   Ref. Range 04/27/2021 10:04 05/24/2021 10:05  Prostate Specific Ag, Serum Latest Ref Range: 0.0 - 4.0 ng/mL 57.3 (H) 41.1 (H)    Acute gout flare up : Last visit uric acid was high   Right hand swelling is on allopurinol for such now. Uric acid level at 9.0  Is on PT post d/c from hospital. To have home health assessment done on Tuesday.  Pt was placed on steroids dosepak. 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 Range 04/22/2021 09:24 04/22/2021 11:29 04/23/2021 04:09  Creatinine Latest Ref Range: 0.61 - 1.24 mg/dL 2.70 (H)   2.65 (H)      Right knee wound / pain : start pt on triple antibiotics, wund dressed by nursing.  Stable. Ortho noted would need to a knee rpepalcemet and pt doesn't want to do this. Walking better since gout is better. Physical therapy comes in once  a week    Dementia : nwas placed on aricept per neurology  check B12, folate, TSH, RPR. Patient will be referred to Neurology, MRI brian in october  to have Neurocognitive testing in 2 months    Problem List Items Addressed This Visit   None Visit Diagnoses     Gout, unspecified cause, unspecified chronicity, unspecified site    -  Primary   Relevant Orders   Uric acid        Orders  Placed This Encounter  Procedures   Uric acid     Meds ordered this  encounter  Medications   colchicine 0.6 MG tablet    Sig: Take 1 tablet (0.6 mg total) by mouth daily.    Dispense:  30 tablet    Refill:  3     Follow up plan: No follow-ups on file.   Labs next visit :  Labs 1 week prior to next visit.

## 2021-06-12 NOTE — Progress Notes (Signed)
Pl let pt know WBC are back to normal.

## 2021-06-13 ENCOUNTER — Encounter: Payer: Self-pay | Admitting: Internal Medicine

## 2021-06-13 LAB — URIC ACID: Uric Acid: 6.8 mg/dL (ref 3.8–8.4)

## 2021-06-20 ENCOUNTER — Ambulatory Visit
Admission: RE | Admit: 2021-06-20 | Discharge: 2021-06-20 | Disposition: A | Discharge status: Home / Self Care | Payer: Federal, State, Local not specified - PPO | Source: Ambulatory Visit | Attending: Physician Assistant | Admitting: Physician Assistant

## 2021-06-29 ENCOUNTER — Other Ambulatory Visit: Payer: Federal, State, Local not specified - PPO

## 2021-06-29 ENCOUNTER — Other Ambulatory Visit: Payer: Self-pay

## 2021-06-29 DIAGNOSIS — R972 Elevated prostate specific antigen [PSA]: Secondary | ICD-10-CM

## 2021-06-30 LAB — PSA: Prostate Specific Ag, Serum: 33.3 ng/mL — ABNORMAL HIGH (ref 0.0–4.0)

## 2021-07-04 ENCOUNTER — Ambulatory Visit: Payer: Federal, State, Local not specified - PPO | Admitting: Urology

## 2021-07-04 ENCOUNTER — Encounter: Payer: Self-pay | Admitting: Urology

## 2021-07-04 ENCOUNTER — Other Ambulatory Visit: Payer: Self-pay

## 2021-07-04 VITALS — BP 125/59 | HR 72 | Ht 72.0 in | Wt 188.0 lb

## 2021-07-04 DIAGNOSIS — R972 Elevated prostate specific antigen [PSA]: Secondary | ICD-10-CM

## 2021-07-04 NOTE — Patient Instructions (Signed)
Prostate Cancer Screening Prostate cancer screening is a test that is done to check for the presence of prostate cancer in men. The prostate gland is a walnut-sized gland that is located below the bladder and in front of the rectum in males. The function of the prostate is to add fluid to semen during ejaculation. Prostate cancer is the second most common type of cancer in men. Who should have prostate cancer screening? Screening recommendations vary based on age and other risk factors. Screening is recommended if: You are older than age 78. If you are age 36-69, talk with your health care provider about your need for screening and how often screening should be done. Because most prostate cancers are slow growing and will not cause death, screening is generally reserved in this age group for men who have a 10-15-year life expectancy. You are younger than age 62, and you have these risk factors: Being a [place] male or a male of African descent. Having a father, brother, or uncle who has been diagnosed with prostate cancer. The risk is higher if your family member's cancer occurred at an early age. Screening is not recommended if: You are younger than age 16. You are between the ages of 24 and 33 and you have no risk factors. You are 34 years of age or older. At this age, the risks that screening can cause are greater than the benefits that it may provide. If you are at high risk for prostate cancer, your health care provider may recommend that you have screenings more often or that you start screening at a younger age. How is screening for prostate cancer done? The recommended prostate cancer screening test is a blood test called the prostate-specific antigen (PSA) test. PSA is a protein that is made in the prostate. As you age, your prostate naturally produces more PSA. Abnormally high PSA levels may be caused by: Prostate cancer. An enlarged prostate that is not caused by cancer (benign prostatic  hyperplasia, BPH). This condition is very common in older men. A prostate gland infection (prostatitis). Depending on the PSA results, you may need more tests, such as: A physical exam to check the size of your prostate gland. Blood and imaging tests. A procedure to remove tissue samples from your prostate gland for testing (biopsy). What are the benefits of prostate cancer screening? Screening can help to identify cancer at an early stage, before symptoms start and when the cancer can be treated more easily. There is a small chance that screening may lower your risk of dying from prostate cancer. The chance is small because prostate cancer is a slow-growing cancer, and most men with prostate cancer die from a different cause. What are the risks of prostate cancer screening? The main risk of prostate cancer screening is diagnosing and treating prostate cancer that would never have caused any symptoms or problems. This is called overdiagnosisand overtreatment. PSA screening cannot tell you if your PSA is high due to cancer or a different cause. A prostate biopsy is the only procedure to diagnose prostate cancer. Even the results of a biopsy may not tell you if your cancer needs to be treated. Slow-growing prostate cancer may not need any treatment other than monitoring, so diagnosing and treating it may cause unnecessary stress or other side effects. Questions to ask your health care provider When should I start prostate cancer screening? What is my risk for prostate cancer? How often do I need screening? What type of screening tests do  I need? How do I get my test results? What do my results mean? Do I need treatment? Where to find more information The American Cancer Society: www.cancer.org American Urological Association: www.auanet.org Contact a health care provider if: You have difficulty urinating. You have pain when you urinate or ejaculate. You have blood in your urine or semen. You  have pain in your back or in the area of your prostate. Summary Prostate cancer is a common type of cancer in men. The prostate gland is located below the bladder and in front of the rectum. This gland adds fluid to semen during ejaculation. Prostate cancer screening may identify cancer at an early stage, when the cancer can be treated more easily. The prostate-specific antigen (PSA) test is the recommended screening test for prostate cancer. Discuss the risks and benefits of prostate cancer screening with your health care provider. If you are age 78 or older, the risks that screening can cause are greater than the benefits that it may provide. This information is not intended to replace advice given to you by your health care provider. Make sure you discuss any questions you have with your health care provider. Document Revised: 11/03/2020 Document Reviewed: 04/15/2019 Elsevier Patient Education  [place].

## 2021-07-04 NOTE — Progress Notes (Signed)
   07/04/2021 10:22 AM   Ronald Pippins Dec 15, 1940 [id number]  Reason for visit: Follow up elevated PSA  HPI: 80 year old male with dementia, hypertension, and CKD with recent creatinine of 2.1 (eGFR 32) who was hospitalized for gout and had a PSA drawn a week later that was significantly elevated at 57.3, with no prior PSA values to review.  DRE at our last visit showed a 50 g benign prostate with no nodules or masses.  Repeat PSA decreased to 41 on September 8th 2022, and subsequently decreased to 33 on 06/29/2021.  We again discussed possible elevations of the PSA including inflammation, BPH, or prostate cancer.  The downtrending of his PSA is certainly reassuring, but we could not rule out with malignancy without an MRI or prostate biopsy.  With his comorbidities, he would like to repeat the PSA in 2 months.  If continues to downtrend will fall to normal, but if PSA remains elevated above 30 would recommend prostate MRI for further evaluation.  PSA reflex to free in 2 months, call with results If greater than 30 recommend prostate MRI, if decreasing trend to normal   Billey [place], [place] [address], [place], [place] [address] [phone]

## 2021-07-24 ENCOUNTER — Encounter (HOSPITAL_COMMUNITY): Payer: Self-pay | Admitting: Radiology

## 2021-08-02 ENCOUNTER — Encounter: Payer: Self-pay | Admitting: Psychology

## 2021-08-06 ENCOUNTER — Other Ambulatory Visit: Payer: Federal, State, Local not specified - PPO

## 2021-08-06 ENCOUNTER — Other Ambulatory Visit: Payer: Self-pay | Admitting: Internal Medicine

## 2021-08-06 ENCOUNTER — Other Ambulatory Visit: Payer: Self-pay

## 2021-08-06 DIAGNOSIS — R972 Elevated prostate specific antigen [PSA]: Secondary | ICD-10-CM

## 2021-08-06 DIAGNOSIS — M109 Gout, unspecified: Secondary | ICD-10-CM

## 2021-08-06 DIAGNOSIS — M1A9XX1 Chronic gout, unspecified, with tophus (tophi): Secondary | ICD-10-CM

## 2021-08-06 NOTE — Telephone Encounter (Signed)
See note from Dr. Neomia Dear on 04/30/2021 regarding abnormal labs - Continue

## 2021-08-07 LAB — CMP14+EGFR
ALT: 29 IU/L (ref 0–44)
AST: 40 IU/L (ref 0–40)
Albumin/Globulin Ratio: 2 (ref 1.2–2.2)
Albumin: 4.2 g/dL (ref 3.7–4.7)
Alkaline Phosphatase: 94 IU/L (ref 44–121)
BUN/Creatinine Ratio: 12 (ref 10–24)
BUN: 25 mg/dL (ref 8–27)
Bilirubin Total: 0.6 mg/dL (ref 0.0–1.2)
CO2: 22 mmol/L (ref 20–29)
Calcium: 10 mg/dL (ref 8.6–10.2)
Chloride: 107 mmol/L — ABNORMAL HIGH (ref 96–106)
Creatinine, Ser: 2.06 mg/dL — ABNORMAL HIGH (ref 0.76–1.27)
Globulin, Total: 2.1 g/dL (ref 1.5–4.5)
Glucose: 98 mg/dL (ref 70–99)
Potassium: 4.5 mmol/L (ref 3.5–5.2)
Sodium: 143 mmol/L (ref 134–144)
Total Protein: 6.3 g/dL (ref 6.0–8.5)
eGFR: 32 mL/min/{1.73_m2} — ABNORMAL LOW (ref >59)

## 2021-08-07 LAB — THYROID PANEL WITH TSH
Free Thyroxine Index: 2.8 (ref 1.2–4.9)
T3 Uptake Ratio: 25 % (ref 24–39)
T4, Total: 11 ug/dL (ref 4.5–12.0)
TSH: 1.13 u[IU]/mL (ref 0.450–4.500)

## 2021-08-07 LAB — CBC WITH DIFFERENTIAL/PLATELET
Basophils Absolute: 0.1 10*3/uL (ref 0.0–0.2)
Basos: 1 %
EOS (ABSOLUTE): 0.1 10*3/uL (ref 0.0–0.4)
Eos: 1 %
Hematocrit: 40.9 % (ref 37.5–51.0)
Hemoglobin: 13.2 g/dL (ref 13.0–17.7)
Immature Grans (Abs): 0 10*3/uL (ref 0.0–0.1)
Immature Granulocytes: 0 %
Lymphocytes Absolute: 1 10*3/uL (ref 0.7–3.1)
Lymphs: 14 %
MCH: 29.9 pg (ref 26.6–33.0)
MCHC: 32.3 g/dL (ref 31.5–35.7)
MCV: 93 fL (ref 79–97)
Monocytes Absolute: 0.7 10*3/uL (ref 0.1–0.9)
Monocytes: 11 %
Neutrophils Absolute: 4.9 10*3/uL (ref 1.4–7.0)
Neutrophils: 73 %
Platelets: 244 10*3/uL (ref 150–450)
RBC: 4.41 x10E6/uL (ref 4.14–5.80)
RDW: 12.2 % (ref 11.6–15.4)
WBC: 6.8 10*3/uL (ref 3.4–10.8)

## 2021-08-07 LAB — LIPID PANEL
Chol/HDL Ratio: 2.7 ratio (ref 0.0–5.0)
Cholesterol, Total: 169 mg/dL (ref 100–199)
HDL: 62 mg/dL (ref >39)
LDL Chol Calc (NIH): 89 mg/dL (ref 0–99)
Triglycerides: 97 mg/dL (ref 0–149)
VLDL Cholesterol Cal: 18 mg/dL (ref 5–40)

## 2021-08-13 ENCOUNTER — Other Ambulatory Visit: Payer: Self-pay

## 2021-08-13 ENCOUNTER — Ambulatory Visit: Payer: Federal, State, Local not specified - PPO | Admitting: Internal Medicine

## 2021-08-13 ENCOUNTER — Encounter: Payer: Self-pay | Admitting: Internal Medicine

## 2021-08-13 VITALS — BP 120/60 | HR 80 | Temp 98.2°F | Ht 72.05 in | Wt 191.2 lb

## 2021-08-13 DIAGNOSIS — N1832 Chronic kidney disease, stage 3b: Secondary | ICD-10-CM | POA: Diagnosis not present

## 2021-08-13 DIAGNOSIS — G308 Other Alzheimer's disease: Secondary | ICD-10-CM

## 2021-08-13 DIAGNOSIS — N400 Enlarged prostate without lower urinary tract symptoms: Secondary | ICD-10-CM

## 2021-08-13 DIAGNOSIS — I10 Essential (primary) hypertension: Secondary | ICD-10-CM

## 2021-08-13 DIAGNOSIS — R972 Elevated prostate specific antigen [PSA]: Secondary | ICD-10-CM

## 2021-08-13 DIAGNOSIS — R000 Tachycardia, unspecified: Secondary | ICD-10-CM

## 2021-08-13 DIAGNOSIS — F02A0 Dementia in other diseases classified elsewhere, mild, without behavioral disturbance, psychotic disturbance, mood disturbance, and anxiety: Secondary | ICD-10-CM

## 2021-08-13 MED ORDER — METOPROLOL SUCCINATE ER 25 MG PO TB24: 25.0000 mg | ORAL_TABLET | Freq: Every day | ORAL | 3 refills | Status: DC

## 2021-08-13 MED ORDER — ASPIRIN 81 MG PO TBEC: 81.0000 mg | DELAYED_RELEASE_TABLET | Freq: Every day | ORAL | 12 refills | Status: DC

## 2021-08-13 MED ORDER — HYDRALAZINE HCL 50 MG PO TABS
50.0000 mg | ORAL_TABLET | Freq: Every evening | ORAL | 1 refills | Status: DC
Start: 2021-08-13 — End: 2021-08-16

## 2021-08-13 NOTE — Progress Notes (Signed)
BP 120/60   Pulse 80   Temp 98.2 F (36.8 C) (Oral)   Ht 6' 0.05" (1.83 m)   Wt 191 lb 3.2 oz (86.7 kg)   SpO2 97%   BMI 25.90 kg/m    Subjective:    Patient ID: Jason Estrada, male    DOB: 1941/07/10, 80 y.o.   MRN: [id number]  Chief Complaint  Patient presents with   Gout    Patient states this has resolved.   Cellulitis    Patient states that this has resolved.     HPI: Jason Estrada is a 80 y.o. male  Pt is here for a follow up.  Pt seen urlogy :  significantly elevated at 57.3 - now down to 33.3 , to have a repeat MRI if still at 30 per urology     Chief Complaint  Patient presents with   Gout    Patient states this has resolved.   Cellulitis    Patient states that this has resolved.     Relevant past medical, surgical, family and social history reviewed and updated as indicated. Interim medical history since our last visit reviewed. Allergies and medications reviewed and updated.  Review of Systems  Per HPI unless specifically indicated above     Objective:    BP 120/60   Pulse 80   Temp 98.2 F (36.8 C) (Oral)   Ht 6' 0.05" (1.83 m)   Wt 191 lb 3.2 oz (86.7 kg)   SpO2 97%   BMI 25.90 kg/m   Wt Readings from Last 3 Encounters:  08/13/21 191 lb 3.2 oz (86.7 kg)  07/04/21 188 lb (85.3 kg)  06/12/21 193 lb 3.2 oz (87.6 kg)    Physical Exam Vitals and nursing note reviewed.  Constitutional:      General: He is not in acute distress.    Appearance: Normal appearance. He is not ill-appearing or diaphoretic.  HENT:     Head: Normocephalic and atraumatic.     Right Ear: Tympanic membrane and external ear normal. There is no impacted cerumen.     Left Ear: External ear normal.     Nose: No congestion or rhinorrhea.     Mouth/Throat:     Pharynx: No oropharyngeal exudate or posterior oropharyngeal erythema.  Eyes:     Conjunctiva/sclera: Conjunctivae normal.     Pupils: Pupils are equal, round, and reactive to light.  Cardiovascular:     Rate  and Rhythm: Regular rhythm. Tachycardia present.     Heart sounds: No murmur heard.   No friction rub. No gallop.  Pulmonary:     Effort: No respiratory distress.     Breath sounds: No stridor. No wheezing or rhonchi.  Chest:     Chest wall: No tenderness.  Abdominal:     General: Abdomen is flat. Bowel sounds are normal.     Palpations: Abdomen is soft. There is no mass.     Tenderness: There is no abdominal tenderness.  Musculoskeletal:     Cervical back: Normal range of motion and neck supple. No rigidity or tenderness.     Left lower leg: No edema.  Skin:    General: Skin is warm and dry.  Neurological:     Mental Status: He is alert.    Results for orders placed or performed in visit on 08/06/21  CMP14+EGFR  Result Value Ref Range   Glucose 98 70 - 99 mg/dL   BUN 25 8 - 27 mg/dL   Creatinine, Ser  2.06 (H) 0.76 - 1.27 mg/dL   eGFR 32 (L) >59 mL/min/1.73   BUN/Creatinine Ratio 12 10 - 24   Sodium 143 134 - 144 mmol/L   Potassium 4.5 3.5 - 5.2 mmol/L   Chloride 107 (H) 96 - 106 mmol/L   CO2 22 20 - 29 mmol/L   Calcium 10.0 8.6 - 10.2 mg/dL   Total Protein 6.3 6.0 - 8.5 g/dL   Albumin 4.2 3.7 - 4.7 g/dL   Globulin, Total 2.1 1.5 - 4.5 g/dL   Albumin/Globulin Ratio 2.0 1.2 - 2.2   Bilirubin Total 0.6 0.0 - 1.2 mg/dL   Alkaline Phosphatase 94 44 - 121 IU/L   AST 40 0 - 40 IU/L   ALT 29 0 - 44 IU/L  Lipid panel  Result Value Ref Range   Cholesterol, Total 169 100 - 199 mg/dL   Triglycerides 97 0 - 149 mg/dL   HDL 62 >39 mg/dL   VLDL Cholesterol Cal 18 5 - 40 mg/dL   LDL Chol Calc (NIH) 89 0 - 99 mg/dL   Chol/HDL Ratio 2.7 0.0 - 5.0 ratio  Thyroid Panel With TSH  Result Value Ref Range   TSH 1.130 0.450 - 4.500 uIU/mL   T4, Total 11.0 4.5 - 12.0 ug/dL   T3 Uptake Ratio 25 24 - 39 %   Free Thyroxine Index 2.8 1.2 - 4.9  CBC with Differential/Platelet  Result Value Ref Range   WBC 6.8 3.4 - 10.8 x10E3/uL   RBC 4.41 4.14 - 5.80 x10E6/uL   Hemoglobin 13.2 13.0 -  17.7 g/dL   Hematocrit 40.9 37.5 - 51.0 %   MCV 93 79 - 97 fL   MCH 29.9 26.6 - 33.0 pg   MCHC 32.3 31.5 - 35.7 g/dL   RDW 12.2 11.6 - 15.4 %   Platelets 244 150 - 450 x10E3/uL   Neutrophils 73 Not Estab. %   Lymphs 14 Not Estab. %   Monocytes 11 Not Estab. %   Eos 1 Not Estab. %   Basos 1 Not Estab. %   Neutrophils Absolute 4.9 1.4 - 7.0 x10E3/uL   Lymphocytes Absolute 1.0 0.7 - 3.1 x10E3/uL   Monocytes Absolute 0.7 0.1 - 0.9 x10E3/uL   EOS (ABSOLUTE) 0.1 0.0 - 0.4 x10E3/uL   Basophils Absolute 0.1 0.0 - 0.2 x10E3/uL   Immature Granulocytes 0 Not Estab. %   Immature Grans (Abs) 0.0 0.0 - 0.1 x10E3/uL        Current Outpatient Medications:    allopurinol (ZYLOPRIM) 100 MG tablet, Take 100 mg by mouth daily., Disp: , Rfl:    baclofen (LIORESAL) 10 MG tablet, Take 10 mg by mouth daily., Disp: , Rfl:    calcitRIOL (ROCALTROL) 0.25 MCG capsule, Take 0.25 mcg by mouth daily., Disp: , Rfl:    colchicine 0.6 MG tablet, Take 1 tablet (0.6 mg total) by mouth daily., Disp: 30 tablet, Rfl: 3   donepezil (ARICEPT) 10 MG tablet, Take half tablet (5 mg) daily for 2 weeks, then increase to the full tablet at 10 mg daily, Disp: 30 tablet, Rfl: 11   folic acid (FOLVITE) 1 MG tablet, Take 1 mg by mouth daily., Disp: , Rfl:    hydrALAZINE (APRESOLINE) 50 MG tablet, Take 1 tablet (50 mg total) by mouth 3 (three) times daily., Disp: 90 tablet, Rfl: 12   neomycin-bacitracin-polymyxin (NEOSPORIN) ointment, Apply 1 application topically every 12 (twelve) hours., Disp: 15 g, Rfl: 2   pantoprazole (PROTONIX) 20 MG tablet, Take 20 mg  by mouth daily., Disp: , Rfl:    pravastatin (PRAVACHOL) 40 MG tablet, Take 40 mg by mouth daily., Disp: , Rfl:    verapamil (CALAN-SR) 240 MG CR tablet, Take 240 mg by mouth daily., Disp: , Rfl:     Assessment & Plan:  Elevated HR at 128  Will start pt on metoprolol 25 mg for such  Start pt ASA 81 mg Cut back on hydralazine to once a day  Bp low normal  Has CKD sec to  HTN   CKD: chronic kidney disease stage IIIb, sees nephro for such.  BPH elevated PSA:  To fu with urology asap x 1 month.  DRE was done @ urology was normal.     Ref. Range 04/27/2021 10:04 05/24/2021 10:05  Prostate Specific Ag, Serum Latest Ref Range: 0.0 - 4.0 ng/mL 57.3 (H) 41.1 (H)      Acute gout flare up   s on allopurinol for such now.  Uric acid - 6.8 stable on allopurinol got such  Continue colchicine and allopurinol     Dementia : nwas placed on aricept per neurology  , MRI brian in october  to have Neurocognitive testing in 2 months not sure if this was done will need to fu , obtain notes, ? Missed appt.  needs to fu with neuro asap.   Advised to call the office or go to the ER if she develops chest pain ,dizziness,  shortness of breath dizziness or tingling or numbness.  Pt verbalized understanding of such.     Problem List Items Addressed This Visit   None    Orders Placed This Encounter  Procedures   Ambulatory referral to Cardiology   EKG 12-Lead     Meds ordered this encounter  Medications   metoprolol succinate (TOPROL-XL) 25 MG 24 hr tablet    Sig: Take 1 tablet (25 mg total) by mouth daily.    Dispense:  30 tablet    Refill:  3   aspirin 81 MG EC tablet    Sig: Take 1 tablet (81 mg total) by mouth daily. Swallow whole.    Dispense:  30 tablet    Refill:  12   hydrALAZINE (APRESOLINE) 50 MG tablet    Sig: Take 1 tablet (50 mg total) by mouth every evening.    Dispense:  30 tablet    Refill:  1     Follow up plan: No follow-ups on file.

## 2021-08-14 ENCOUNTER — Telehealth: Payer: Self-pay

## 2021-08-14 NOTE — Telephone Encounter (Signed)
Called to inform patient that he has an Appt with Sharene Butters at [facility] in [place] at 11:30

## 2021-08-14 NOTE — Telephone Encounter (Signed)
Spoke with Minette Headland at [facility] to an appt for patient. Appt is tomorrow at 11:30 am

## 2021-08-15 ENCOUNTER — Other Ambulatory Visit: Payer: Self-pay

## 2021-08-15 ENCOUNTER — Encounter: Payer: Self-pay | Admitting: Physician Assistant

## 2021-08-15 ENCOUNTER — Ambulatory Visit: Payer: Federal, State, Local not specified - PPO | Admitting: Physician Assistant

## 2021-08-15 ENCOUNTER — Other Ambulatory Visit: Payer: Self-pay | Admitting: Internal Medicine

## 2021-08-15 VITALS — BP 121/78 | HR 77 | Ht 72.0 in | Wt 191.0 lb

## 2021-08-15 DIAGNOSIS — G309 Alzheimer's disease, unspecified: Secondary | ICD-10-CM

## 2021-08-15 DIAGNOSIS — F02818 Dementia in other diseases classified elsewhere, unspecified severity, with other behavioral disturbance: Secondary | ICD-10-CM | POA: Diagnosis not present

## 2021-08-15 DIAGNOSIS — F0280 Dementia in other diseases classified elsewhere without behavioral disturbance: Secondary | ICD-10-CM | POA: Insufficient documentation

## 2021-08-15 DIAGNOSIS — I10 Essential (primary) hypertension: Secondary | ICD-10-CM

## 2021-08-15 HISTORY — DX: Dementia in other diseases classified elsewhere, unspecified severity, without behavioral disturbance, psychotic disturbance, mood disturbance, and anxiety: F02.80

## 2021-08-15 HISTORY — DX: Alzheimer's disease, unspecified: G30.9

## 2021-08-15 MED ORDER — VERAPAMIL HCL ER 240 MG PO TBCR: 240.0000 mg | EXTENDED_RELEASE_TABLET | Freq: Every day | ORAL | 4 refills | Status: DC

## 2021-08-15 MED ORDER — PANTOPRAZOLE SODIUM 20 MG PO TBEC
20.0000 mg | DELAYED_RELEASE_TABLET | Freq: Every day | ORAL | 4 refills | Status: DC
  Filled 2021-08-15: qty 30, 30d supply, fill #0

## 2021-08-15 MED ORDER — PANTOPRAZOLE SODIUM 20 MG PO TBEC: 20.0000 mg | DELAYED_RELEASE_TABLET | Freq: Every day | ORAL | 4 refills | Status: DC

## 2021-08-15 MED ORDER — VERAPAMIL HCL ER 240 MG PO TBCR
240.0000 mg | EXTENDED_RELEASE_TABLET | Freq: Every day | ORAL | 4 refills | Status: DC
  Filled 2021-08-15: qty 30, 30d supply, fill #0

## 2021-08-15 NOTE — Progress Notes (Signed)
Ok thnx much

## 2021-08-15 NOTE — Progress Notes (Signed)
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  Discussed the importance of regular daily schedule to maintain brain function.  Continue to monitor mood by PCP Stay active at least 30 minutes at least 3 times a week. Information for day programs given to his wife as the patient will need frequent monitoring Naps should be scheduled and should be no longer than 60 minutes and should not occur after 2 PM.  Mediterranean diet is recommended  Continue donepezil 10 mg daily Side effects were discussed Follow up after his scheduled Neurocognitive exam in  11/2021 No further driving is recommended   Case discussed with Dr.Jaffe who agrees with the plan     Subjective:        Clemons Jason Estrada is a 80 y.o.  RH male hypertension, hyperlipidemia, stage IIIa CKD, enlarged prostate, seen today for evaluation of memory loss.  He was first seen at our office on 06/04/2021, at which time mild to moderate dementia was suspected.  MoCA at the time was 17/30.  He was started on donepezil 10 mg daily, tolerating well.  MRI of the brain on 06/20/2021 was remarkable for mild to moderate generalized cerebral atrophy and mild cerebellar atrophy, with moderate chronic small vessel ischemic changes.  He is scheduled for neurocognitive testing in March 2023 for diagnostic clarity. This patient is accompanied in the office by his who supplements the history.  Previous records as well as any outside records available were reviewed prior to todays visit.   When asked about his memory, he reports he is "pretty good ".  His wife adds that his memory is "about where he was ".  He continues to repeat the same sentences and questions.  He still cannot keep time frames, "everything is blurred in the sense of he cannot remember if something happened yesterday or 2 weeks ago ".  He states  that his mood is good, denies depression or irritability.  However since his last visit, he has more behavioral changes, such as obsession with trash.  "He takes out the trash can and then looks for things inside, and brings the garbage back to the house ".  He lives food containers and he insists to place the dirty once inside the cabinets, including paper plates.  He does the same thing with glasses.  If she takes him out to eat, he cannot choose anymore foods that he likes, and he is capable of eating the same thing every day.  During Thanksgiving, family came to visit as they did every year, and he spent most of the time sitting watching TV, he likes to watch 1 show over and over, and at 1 point his grandchild asked him for the remote control, which created anger and the patient.  The family told his wife that they are not to return for any major holiday, which brought her to tears.  She is in charge of the bills and medications.  He no longer cooks.  Hygiene is an issue, he does note want to take a shower and sometimes 2 weeks past by before he takes one.  He does not want to take a bath either or change underwear.  This creates significant resentment from his wife, who states "I am ready to walk out ".  He does not like to walk, physical therapy comes to the house, and when  they are there, he is cooperative, but he will not independently try to exercise or walk.  No head injuries or falls.  He continues to drive short distances, and familiar places.  Of note, when asked questions, the patient has a tendency to not be emotional about his answers, he is showing more apathy than in his prior visit. He  likes to play solitaire on his phone, trivia, he is reading less.  He sleep is good, and has some vivid dreams without sleepwalking. At times he confuses the days and nights. "He wanted to eat breakfast at midnight".  He continues to have urine hesitancy because of prostatic issues, wife reports that his PSA is  trending down.  No hallucinations or paranoia are reported. Denies headaches, double vision, dizziness, focal numbness or tingling, unilateral weakness or tremors.  No constipation or diarrhea. Denies anosmia.   Initial Evaluation 9/19/22The patient is seen in neurologic consultation at the request of [name], [name], MD for the evaluation of memory.  The patient is accompanied by his wife who supplements the history.  80 y.o. year old male who has had memory issues for about 1 year when he noticed worsening short term memory deficiencies. "He pauses till he can find the word" -wife says.  He misses appointments frequently. He moved from [place] 1 year ago, and his wife would travel from there to [place] during this time, able to see these changes. "This was worse until 4 weeks ago", somehow it is a little bit better but not much". He does repeat the same stories and questions. He denies depression or irritability. His mood is "good". He  likes to play solitaire on his phone, trivia, reading and walking. He sleeps better than when first moving, He has some vivid dreams without  sleepwalking. At times he confuses the days and nights. "He wanted to eat breakfast at midnight".  In July he was confused, found to have elevated PSA and was referred to a urologist, workup ongoing. He has some urine hesitancy because of prostatic issues. Currently denies hallucinations or paranoia. Denies leaving objects in unusual places.   Wife reports issues with hygiene."Bathing since around July, is a battle, and he likes to use the same underwear". Wife administers the medications and manages the bills, because he fell victim to scams. His appetite is good and denies trouble swallowing, He cooks and denies leaving the stove on. Wife supervises . He ambulates without devices, but is prone to falls due to gout. Denies head injuries.    He drives with GPS and wife is concerned because he relies on it and "still gets lost, took me  to the wrong place the other day". He has issues with time gap as well. He believes that he has been in [place] longer, no sense of when and where he is".  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 Recently has  worked for [employer]. Denies headaches, double vision, dizziness, focal numbness or tingling, unilateral weakness or tremors.  No constipation or diarrhea. Denies anosmia. Denies history of OSA, ETOH or Tobacco. Family History negative for dementia.   Labs on 04/22/2021 CBC with hemoglobin 12.3/hematocrit 37.9 (in the setting of CKD) otherwise normal Creatinine 2.7 (CKD) Uric acid 9 (gout) B12 427 Folate RPR TSH 0.905 T4 1.71 RPR non reactive PSA 41.1 (has been referred to urology)  Lipid panel normal  PREVIOUS MEDICATIONS:   CURRENT MEDICATIONS:  Outpatient Encounter Medications as of 08/15/2021  Medication Sig  allopurinol (ZYLOPRIM) 100 MG tablet Take 100 mg by mouth daily.   aspirin 81 MG EC tablet Take 1 tablet (81 mg total) by mouth daily. Swallow whole.   baclofen (LIORESAL) 10 MG tablet Take 10 mg by mouth daily.   calcitRIOL (ROCALTROL) 0.25 MCG capsule Take 0.25 mcg by mouth daily.   colchicine 0.6 MG tablet Take 1 tablet (0.6 mg total) by mouth daily.   donepezil (ARICEPT) 10 MG tablet Take half tablet (5 mg) daily for 2 weeks, then increase to the full tablet at 10 mg daily   folic acid (FOLVITE) 1 MG tablet Take 1 mg by mouth daily.   hydrALAZINE (APRESOLINE) 50 MG tablet Take 1 tablet (50 mg total) by mouth every evening.   metoprolol succinate (TOPROL-XL) 25 MG 24 hr tablet Take 1 tablet (25 mg total) by mouth daily.   neomycin-bacitracin-polymyxin (NEOSPORIN) ointment Apply 1 application topically every 12 (twelve) hours.   pravastatin (PRAVACHOL) 40 MG tablet Take 40 mg by mouth daily.   [DISCONTINUED] pantoprazole (PROTONIX) 20 MG tablet Take 20 mg by mouth daily.   [DISCONTINUED] verapamil (CALAN-SR) 240 MG CR tablet Take 240  mg by mouth daily.   No facility-administered encounter medications on file as of 08/15/2021.     Objective:     PHYSICAL EXAMINATION:    VITALS:   Vitals:   08/15/21 1126  BP: 121/78  Pulse: 77  SpO2: 97%  Weight: 191 lb (86.6 kg)  Height: 6' (1.829 m)    GEN:  The patient appears stated age and is in NAD. HEENT:  Normocephalic, atraumatic.   Neurological examination:  General: NAD, appears stated age. Orientation: The patient is alert. Oriented to person, place and date Cranial nerves: There is good facial symmetry.The speech is fluent and clear. No aphasia or dysarthria. Fund of knowledge is appropriate. Recent and remote memory are impaired. Attention and concentration are reduced.  Able to name objects, has difficulty repeating phrases.  Hearing is intact to conversational tone.    Sensation: Sensation is intact to light touch throughout Motor: Strength is at least antigravity x4. Tremors: none  DTR's 2/4 in [facility] Cognitive Assessment  06/01/2021  Visuospatial/ Executive (0/5) 4  Naming (0/3) 3  Attention: Read list of digits (0/2) 2  Attention: Read list of letters (0/1) 0  Attention: Serial 7 subtraction starting at 100 (0/3) 1  Language: Repeat phrase (0/2) 2  Language : Fluency (0/1) 0  Abstraction (0/2) 0  Delayed Recall (0/5) 0  Orientation (0/6) 4  Total 16  Adjusted Score (based on education) 17   No flowsheet data found.  No flowsheet data found.     Movement examination: Tone: There is normal tone in the UE/LE Abnormal movements:  no tremor.  No myoclonus.  No asterixis.   Coordination:  There is no decremation with RAM's. Normal finger to nose  Gait and Station: The patient has no difficulty arising out of a deep-seated chair without the use of the hands. The patient's stride length is good.  Gait is cautious and narrow.      Total time spent on today's visit was  45 minutes, including both face-to-face time and nonface-to-face  time. Time included that spent on review of records (prior notes available to me/labs/imaging if pertinent), discussing treatment and goals, answering patient's questions and coordinating care.  Cc:  Charlynne Cousins, MD Sharene Butters, PA-C

## 2021-08-15 NOTE — Patient Instructions (Addendum)
It was a pleasure to see you today at our office.   Recommendations:  Follow up after his scheduled Neurocognitive exam in  11/2021 Continue Donepezil 10 mg daily.   PLease feel free to call Arnell Asal, Social Worker at [facility] at [phone] for guidance if placement were needed at the facility. Also, you can call [facility] [phone] for guidance about Day programs  No more driving for safety reason    RECOMMENDATIONS FOR ALL PATIENTS WITH MEMORY PROBLEMS: 1. Continue to exercise (Recommend 30 minutes of walking everyday, or 3 hours every week) 2. Increase social interactions - continue going to [place] and enjoy social gatherings with friends and family 3. Eat healthy, avoid fried foods and eat more fruits and vegetables 4. Maintain adequate blood pressure, blood sugar, and blood cholesterol level. Reducing the risk of stroke and cardiovascular disease also helps promoting better memory. 5. Avoid stressful situations. Live a simple life and avoid aggravations. Organize your time and prepare for the next day in anticipation. 6. Sleep well, avoid any interruptions of sleep and avoid any distractions in the bedroom that may interfere with adequate sleep quality 7. Avoid sugar, avoid sweets as there is a strong link between excessive sugar intake, diabetes, and cognitive impairment We discussed the Mediterranean diet, which has been shown to help patients reduce the risk of progressive memory disorders and reduces cardiovascular risk. This includes eating fish, eat fruits and green leafy vegetables, nuts like almonds and hazelnuts, walnuts, and also use olive oil. Avoid fast foods and fried foods as much as possible. Avoid sweets and sugar as sugar use has been linked to worsening of memory function.  There is always a concern of gradual progression of memory problems. If this is the case, then we may need to adjust level of care according to patient needs.  Support, both to the patient and caregiver, should then be put into place.      You have been referred for a neuropsychological evaluation (i.e., evaluation of memory and thinking abilities). Please bring someone with you to this appointment if possible, as it is helpful for the doctor to hear from both you and another adult who knows you well. Please bring eyeglasses and hearing aids if you wear them.    The evaluation will take approximately 3 hours and has two parts:   The first part is a clinical interview with the neuropsychologist (Dr. Melvyn Novas or Dr. Nicole Kindred). During the interview, the neuropsychologist will speak with you and the individual you brought to the appointment.    The second part of the evaluation is testing with the doctor's technician Hinton Dyer or Maudie Mercury). During the testing, the technician will ask you to remember different types of material, solve problems, and answer some questionnaires. Your family member will not be present for this portion of the evaluation.   Please note: We must reserve several hours of the neuropsychologist's time and the psychometrician's time for your evaluation appointment. As such, there is a No-Show fee of $100. If you are unable to attend any of your appointments, please contact our office as soon as possible to reschedule.    FALL PRECAUTIONS: Be cautious when walking. Scan the area for obstacles that may increase the risk of trips and falls. When getting up in the mornings, sit up at the edge of the bed for a few minutes before getting out of bed. Consider elevating the bed at the head end to avoid drop of blood pressure when getting up.  Walk always in a well-lit room (use night lights in the walls). Avoid area rugs or power cords from appliances in the middle of the walkways. Use a walker or a cane if necessary and consider physical therapy for balance exercise. Get your eyesight checked regularly.  FINANCIAL OVERSIGHT: Supervision, especially oversight  when making financial decisions or transactions is also recommended.  HOME SAFETY: Consider the safety of the kitchen when operating appliances like stoves, microwave oven, and blender. Consider having supervision and share cooking responsibilities until no longer able to participate in those. Accidents with firearms and other hazards in the house should be identified and addressed as well.   ABILITY TO BE LEFT ALONE: If patient is unable to contact 911 operator, consider using LifeLine, or when the need is there, arrange for someone to stay with patients. Smoking is a fire hazard, consider supervision or cessation. Risk of wandering should be assessed by caregiver and if detected at any point, supervision and safe proof recommendations should be instituted.  MEDICATION SUPERVISION: Inability to self-administer medication needs to be constantly addressed. Implement a mechanism to ensure safe administration of the medications.   DRIVING: Regarding driving, in patients with progressive memory problems, driving will be impaired. We advise to have someone else do the driving if trouble finding directions or if minor accidents are reported. Independent driving assessment is available to determine safety of driving.   If you are interested in the driving assessment, you can contact the following:  [facility] in [place]  [place] [phone] or [phone]    [place] refers to food and lifestyle choices that are based on the traditions of countries located on the [employer]. This way of eating has been shown to help prevent certain conditions and improve outcomes for people who have chronic diseases, like kidney disease and heart disease. What are tips for following this plan? Lifestyle  Cook and eat meals together with your family, when  possible. Drink enough fluid to keep your urine clear or pale yellow. Be physically active every day. This includes: Aerobic exercise like running or swimming. Leisure activities like gardening, walking, or housework. Get 7-8 hours of sleep each night. If recommended by your health care provider, drink red wine in moderation. This means 1 glass a day for nonpregnant women and 2 glasses a day for men. A glass of wine equals 5 oz (150 mL). Reading food labels  Check the serving size of packaged foods. For foods such as rice and pasta, the serving size refers to the amount of cooked product, not dry. Check the total fat in packaged foods. Avoid foods that have saturated fat or trans fats. Check the ingredients list for added sugars, such as corn syrup. Shopping  At the grocery store, buy most of your food from the areas near the walls of the store. This includes: Fresh fruits and vegetables (produce). Grains, beans, nuts, and seeds. Some of these may be available in unpackaged forms or large amounts (in bulk). Fresh seafood. Poultry and eggs. Low-fat dairy products. Buy whole ingredients instead of prepackaged foods. Buy fresh fruits and vegetables in-season from local farmers markets. Buy frozen fruits and vegetables in resealable bags. If you do not have access to quality fresh seafood, buy precooked frozen shrimp or canned fish, such as tuna, salmon, or sardines. Buy small amounts of raw or cooked vegetables, salads, or olives from the deli  or salad bar at your store. Stock your pantry so you always have certain foods on hand, such as olive oil, canned tuna, canned tomatoes, rice, pasta, and beans. Cooking  Cook foods with extra-virgin olive oil instead of using butter or other vegetable oils. Have meat as a side dish, and have vegetables or grains as your main dish. This means having meat in small portions or adding small amounts of meat to foods like pasta or stew. Use beans or  vegetables instead of meat in common dishes like chili or lasagna. Experiment with different cooking methods. Try roasting or broiling vegetables instead of steaming or sauteing them. Add frozen vegetables to soups, stews, pasta, or rice. Add nuts or seeds for added healthy fat at each meal. You can add these to yogurt, salads, or vegetable dishes. Marinate fish or vegetables using olive oil, lemon juice, garlic, and fresh herbs. Meal planning  Plan to eat 1 vegetarian meal one day each week. Try to work up to 2 vegetarian meals, if possible. Eat seafood 2 or more times a week. Have healthy snacks readily available, such as: Vegetable sticks with hummus. Greek yogurt. Fruit and nut trail mix. Eat balanced meals throughout the week. This includes: Fruit: 2-3 servings a day Vegetables: 4-5 servings a day Low-fat dairy: 2 servings a day Fish, poultry, or lean meat: 1 serving a day Beans and legumes: 2 or more servings a week Nuts and seeds: 1-2 servings a day Whole grains: 6-8 servings a day Extra-virgin olive oil: 3-4 servings a day Limit red meat and sweets to only a few servings a month What are my food choices? Mediterranean diet Recommended Grains: Whole-grain pasta. Brown rice. Bulgar wheat. Polenta. Couscous. Whole-wheat bread. [place]. Vegetables: Artichokes. Beets. Broccoli. Cabbage. Carrots. Eggplant. Green beans. Chard. Kale. Spinach. Onions. Leeks. Peas. Squash. Tomatoes. Peppers. Radishes. Fruits: Apples. Apricots. Avocado. Berries. Bananas. Cherries. Dates. Figs. Grapes. Lemons. Melon. Oranges. Peaches. Plums. Pomegranate. Meats and other protein foods: Beans. Almonds. Sunflower seeds. Pine nuts. Peanuts. [place]. Salmon. Scallops. Shrimp. [place]. Tilapia. Clams. Oysters. Eggs. Dairy: Low-fat milk. Cheese. Greek yogurt. Beverages: Water. Red wine. Herbal tea. Fats and oils: Extra virgin olive oil. Avocado oil. Grape seed oil. Sweets and desserts: [place] yogurt with honey.  Baked apples. Poached pears. Trail mix. Seasoning and other foods: Basil. Cilantro. Coriander. Cumin. Mint. Parsley. Sage. Rosemary. Tarragon. Garlic. Oregano. Thyme. Pepper. Balsalmic vinegar. Tahini. Hummus. Tomato sauce. Olives. Mushrooms. Limit these Grains: Prepackaged pasta or rice dishes. Prepackaged cereal with added sugar. Vegetables: Deep fried potatoes (french fries). Fruits: Fruit canned in syrup. Meats and other protein foods: Beef. Pork. Lamb. Poultry with skin. Hot dogs. [place] Salines. Dairy: Ice cream. Sour cream. Whole milk. Beverages: Juice. Sugar-sweetened soft drinks. Beer. Liquor and spirits. Fats and oils: Butter. Canola oil. Vegetable oil. Beef fat (tallow). Lard. Sweets and desserts: Cookies. Cakes. Pies. Candy. Seasoning and other foods: Mayonnaise. Premade sauces and marinades. The items listed may not be a complete list. Talk with your dietitian about what dietary choices are right for you. Summary The Mediterranean diet includes both food and lifestyle choices. Eat a variety of fresh fruits and vegetables, beans, nuts, seeds, and whole grains. Limit the amount of red meat and sweets that you eat. Talk with your health care provider about whether it is safe for you to drink red wine in moderation. This means 1 glass a day for nonpregnant women and 2 glasses a day for men. A glass of wine equals 5 oz (150 mL). This information  is not intended to replace advice given to you by your health care provider. Make sure you discuss any questions you have with your health care provider. Document Released: 04/25/2016 Document Revised: 05/28/2016 Document Reviewed: 04/25/2016 Elsevier Interactive Patient Education  2017 [employer].

## 2021-08-16 ENCOUNTER — Other Ambulatory Visit: Payer: Self-pay

## 2021-08-16 ENCOUNTER — Ambulatory Visit: Payer: Federal, State, Local not specified - PPO | Admitting: Cardiology

## 2021-08-16 ENCOUNTER — Encounter: Payer: Self-pay | Admitting: Cardiology

## 2021-08-16 VITALS — BP 110/58 | HR 63 | Ht 72.0 in | Wt 183.0 lb

## 2021-08-16 DIAGNOSIS — I10 Essential (primary) hypertension: Secondary | ICD-10-CM

## 2021-08-16 DIAGNOSIS — R000 Tachycardia, unspecified: Secondary | ICD-10-CM | POA: Diagnosis not present

## 2021-08-16 DIAGNOSIS — E7800 Pure hypercholesterolemia, unspecified: Secondary | ICD-10-CM | POA: Diagnosis not present

## 2021-08-16 MED ORDER — HYDRALAZINE HCL 50 MG PO TABS: 25.0000 mg | ORAL_TABLET | Freq: Two times a day (BID) | ORAL | 3 refills | Status: DC

## 2021-08-16 NOTE — Patient Instructions (Signed)
Medication Instructions:   Your physician has recommended you make the following change in your medication:    STOP taking Metoprolol Succinate (Toprol XL).  2.  Take Hydralazine 25 MG twice a day.  *If you need a refill on your cardiac medications before your next appointment, please call your pharmacy*   Lab Work: None ordered If you have labs (blood work) drawn today and your tests are completely normal, you will receive your results only by: [place] (if you have MyChart) OR A paper copy in the mail If you have any lab test that is abnormal or we need to change your treatment, we will call you to review the results.   Testing/Procedures: None ordered   Follow-Up: At [facility], you and your health needs are our priority.  As part of our continuing mission to provide you with exceptional heart care, we have created designated Provider Care Teams.  These Care Teams include your primary Cardiologist (physician) and Advanced Practice Providers (APPs -  Physician Assistants and Nurse Practitioners) who all work together to provide you with the care you need, when you need it.  We recommend signing up for the patient portal called "MyChart".  Sign up information is provided on this After Visit Summary.  MyChart is used to connect with patients for Virtual Visits (Telemedicine).  Patients are able to view lab/test results, encounter notes, upcoming appointments, etc.  Non-urgent messages can be sent to your provider as well.   To learn more about what you can do with MyChart, go to [email or web address].    Your next appointment:   6 week(s)  The format for your next appointment:   In Person  Provider:    Dr. Rosiland Oz     Other Instructions

## 2021-08-16 NOTE — Progress Notes (Signed)
Cardiology Office Note:    Date:  08/16/2021   ID:  Jason Estrada, DOB 1941-05-14, MRN [id number]  PCP:  Charlynne Cousins, MD   [facility] Providers Cardiologist:  None     Referring MD: Charlynne Cousins, MD   Chief Complaint  Patient presents with   New Patient (Initial Visit)    Referred by pcp for Tachycardia. Meds reviewed verbally with patient.    Jason Estrada is a 80 y.o. male who is being seen today for the evaluation of tachycardia at the request of [name], [name], MD.   History of Present Illness:    Jason Estrada is a 80 y.o. male with a hx of hypertension, hyperlipidemia, CKD 3, dementia who presents due to elevated heart rates.  Saw PCP for scheduled visit.  Heart rates were noted to be elevated during clinic visit.  EKG obtained showed sinus tachycardia heart rate 128.  Patient was started on Toprol-XL.  Hydralazine was decreased to 50 mg daily by PCP with starting Toprol-XL.  Patient takes verapamil 240 mg for BP control.  Has been on this for years.  He ran out of prescription for verapamil a week ago.  He just started taking new prescription yesterday.  Patient denies chest pain or shortness of breath, palpitations..  Denies any history of heart disease.  Past Medical History:  Diagnosis Date   Dementia ([place])    Gout    Hypertension    Renal disorder     Past Surgical History:  Procedure Laterality Date   CHOLECYSTECTOMY      Current Medications: Current Meds  Medication Sig   allopurinol (ZYLOPRIM) 100 MG tablet Take 100 mg by mouth daily.   aspirin 81 MG EC tablet Take 1 tablet (81 mg total) by mouth daily. Swallow whole.   baclofen (LIORESAL) 10 MG tablet Take 10 mg by mouth daily.   calcitRIOL (ROCALTROL) 0.25 MCG capsule Take 0.25 mcg by mouth daily.   colchicine 0.6 MG tablet Take 1 tablet (0.6 mg total) by mouth daily.   donepezil (ARICEPT) 10 MG tablet Take half tablet (5 mg) daily for 2 weeks, then increase to the full tablet at 10 mg daily   folic  acid (FOLVITE) 1 MG tablet Take 1 mg by mouth daily.   pantoprazole (PROTONIX) 20 MG tablet Take 1 tablet (20 mg total) by mouth daily.   pravastatin (PRAVACHOL) 40 MG tablet Take 40 mg by mouth daily.   verapamil (CALAN-SR) 240 MG CR tablet Take 1 tablet (240 mg total) by mouth daily.   [DISCONTINUED] hydrALAZINE (APRESOLINE) 50 MG tablet Take 1 tablet (50 mg total) by mouth every evening.   [DISCONTINUED] metoprolol succinate (TOPROL-XL) 25 MG 24 hr tablet Take 1 tablet (25 mg total) by mouth daily.     Allergies:   Patient has no known allergies.   Social History   Socioeconomic History   Marital status: Married    Spouse name: Not on file   Number of children: Not on file   Years of education: 16   Highest education level: Not on file  Occupational History   Not on file  Tobacco Use   Smoking status: Never   Smokeless tobacco: Never  Vaping Use   Vaping Use: Never used  Substance and Sexual Activity   Alcohol use: Not Currently   Drug use: Not Currently   Sexual activity: Not Currently  Other Topics Concern   Not on file  Social History Narrative   Right handed   Drinks  caffeine   One story home   Social Determinants of Health   Financial Resource Strain: Not on file  Food Insecurity: Not on file  Transportation Needs: Not on file  Physical Activity: Not on file  Stress: Not on file  Social Connections: Not on file     Family History: The patient's family history is not on file.  ROS:   Please see the history of present illness.     All other systems reviewed and are negative.  EKGs/Labs/Other Studies Reviewed:    The following studies were reviewed today:   EKG:  EKG is  ordered today.  The ekg ordered today demonstrates sinus rhythm, first-degree AV block, heart rate 63.  Recent Labs: 08/06/2021: ALT 29; BUN 25; Creatinine, Ser 2.06; Hemoglobin 13.2; Platelets 244; Potassium 4.5; Sodium 143; TSH 1.130  Recent Lipid Panel    Component Value  Date/Time   CHOL 169 08/06/2021 1018   TRIG 97 08/06/2021 1018   HDL 62 08/06/2021 1018   CHOLHDL 2.7 08/06/2021 1018   LDLCALC 89 08/06/2021 1018     Risk Assessment/Calculations:          Physical Exam:    VS:  BP (!) 110/58 (BP Location: Left Arm, Patient Position: Sitting, Cuff Size: Normal)   Pulse 63   Ht 6' (1.829 m)   Wt 183 lb (83 kg)   SpO2 98%   BMI 24.82 kg/m     Wt Readings from Last 3 Encounters:  08/16/21 183 lb (83 kg)  08/15/21 191 lb (86.6 kg)  08/13/21 191 lb 3.2 oz (86.7 kg)     GEN:  Well nourished, well developed in no acute distress HEENT: Normal NECK: No JVD; No carotid bruits LYMPHATICS: No lymphadenopathy CARDIAC: RRR, no murmurs, rubs, gallops RESPIRATORY:  Clear to auscultation without rales, wheezing or rhonchi  ABDOMEN: Soft, non-tender, non-distended MUSCULOSKELETAL:  No edema; No deformity  SKIN: Warm and dry NEUROLOGIC:  Alert and oriented x 3 PSYCHIATRIC:  Normal affect   ASSESSMENT:    1. Primary hypertension   2. Sinus tachycardia   3. Pure hypercholesterolemia    PLAN:    In order of problems listed above:  Hypertension, BP low normal.  Dose hydralazine 25 mg twice daily, continue verapamil.  Stop Toprol-XL.  Monitor blood pressure.  Consider further titration as BP permits. Sinus tachycardia, likely from interrupting/stopping verapamil.  Verapamil to 40 mg daily has been restarted, heart rate in the 60s today.  Stop Toprol-XL as above. Hyperlipidemia, cholesterol controlled, continue Pravachol.  Follow-up in 6 weeks       Medication Adjustments/Labs and Tests Ordered: Current medicines are reviewed at length with the patient today.  Concerns regarding medicines are outlined above.  Orders Placed This Encounter  Procedures   EKG 12-Lead    Meds ordered this encounter  Medications   hydrALAZINE (APRESOLINE) 50 MG tablet    Sig: Take 0.5 tablets (25 mg total) by mouth in the morning and at bedtime.    Dispense:   30 tablet    Refill:  3    NO NEED TO REFILL at this time. Change in the way patient is taking it, same dose.     Patient Instructions  Medication Instructions:   Your physician has recommended you make the following change in your medication:    STOP taking Metoprolol Succinate (Toprol XL).  2.  Take Hydralazine 25 MG twice a day.  *If you need a refill on your cardiac medications before your next  appointment, please call your pharmacy*   Lab Work: None ordered If you have labs (blood work) drawn today and your tests are completely normal, you will receive your results only by: [place] (if you have MyChart) OR A paper copy in the mail If you have any lab test that is abnormal or we need to change your treatment, we will call you to review the results.   Testing/Procedures: None ordered   Follow-Up: At [facility], you and your health needs are our priority.  As part of our continuing mission to provide you with exceptional heart care, we have created designated Provider Care Teams.  These Care Teams include your primary Cardiologist (physician) and Advanced Practice Providers (APPs -  Physician Assistants and Nurse Practitioners) who all work together to provide you with the care you need, when you need it.  We recommend signing up for the patient portal called "MyChart".  Sign up information is provided on this After Visit Summary.  MyChart is used to connect with patients for Virtual Visits (Telemedicine).  Patients are able to view lab/test results, encounter notes, upcoming appointments, etc.  Non-urgent messages can be sent to your provider as well.   To learn more about what you can do with MyChart, go to [email or web address].    Your next appointment:   6 week(s)  The format for your next appointment:   In Person  Provider:    Dr. Rosiland Oz     Other Instructions     Signed, Kate Sable, MD  08/16/2021 12:22 PM    [place]

## 2021-08-27 ENCOUNTER — Other Ambulatory Visit: Payer: Self-pay

## 2021-08-27 ENCOUNTER — Ambulatory Visit (INDEPENDENT_AMBULATORY_CARE_PROVIDER_SITE_OTHER): Payer: Federal, State, Local not specified - PPO | Admitting: Internal Medicine

## 2021-08-27 ENCOUNTER — Encounter: Payer: Self-pay | Admitting: Internal Medicine

## 2021-08-27 VITALS — BP 104/50 | HR 81 | Temp 98.2°F | Ht 72.05 in | Wt 198.8 lb

## 2021-08-27 DIAGNOSIS — E785 Hyperlipidemia, unspecified: Secondary | ICD-10-CM | POA: Diagnosis not present

## 2021-08-27 DIAGNOSIS — R000 Tachycardia, unspecified: Secondary | ICD-10-CM

## 2021-08-27 DIAGNOSIS — F0390 Unspecified dementia without behavioral disturbance: Secondary | ICD-10-CM

## 2021-08-27 DIAGNOSIS — Z23 Encounter for immunization: Secondary | ICD-10-CM

## 2021-08-27 DIAGNOSIS — I10 Essential (primary) hypertension: Secondary | ICD-10-CM

## 2021-08-27 NOTE — Progress Notes (Addendum)
BP (!) 104/50   Pulse 81   Temp 98.2 F (36.8 C) (Oral)   Ht 6' 0.05" (1.83 m)   Wt 198 lb 12.8 oz (90.2 kg)   SpO2 97%   BMI 26.93 kg/m    Subjective:    Patient ID: Jason Estrada, male    DOB: [birth year], 80 y.o.   MRN: [id number]  Chief Complaint  Patient presents with   Tachycardia    Seen Cardiology on 12/1    HPI: Jason Estrada is a 80 y.o. male  Hypertension   Chief Complaint  Patient presents with   Tachycardia    Seen Cardiology on 12/1    Relevant past medical, surgical, family and social history reviewed and updated as indicated. Interim medical history since our last visit reviewed. Allergies and medications reviewed and updated.  Review of Systems  Per HPI unless specifically indicated above     Objective:    BP (!) 104/50   Pulse 81   Temp 98.2 F (36.8 C) (Oral)   Ht 6' 0.05" (1.83 m)   Wt 198 lb 12.8 oz (90.2 kg)   SpO2 97%   BMI 26.93 kg/m   Wt Readings from Last 3 Encounters:  08/27/21 198 lb 12.8 oz (90.2 kg)  08/16/21 183 lb (83 kg)  08/15/21 191 lb (86.6 kg)    Physical Exam  Results for orders placed or performed in visit on 08/06/21  CMP14+EGFR  Result Value Ref Range   Glucose 98 70 - 99 mg/dL   BUN 25 8 - 27 mg/dL   Creatinine, Ser 2.06 (H) 0.76 - 1.27 mg/dL   eGFR 32 (L) >59 mL/min/1.73   BUN/Creatinine Ratio 12 10 - 24   Sodium 143 134 - 144 mmol/L   Potassium 4.5 3.5 - 5.2 mmol/L   Chloride 107 (H) 96 - 106 mmol/L   CO2 22 20 - 29 mmol/L   Calcium 10.0 8.6 - 10.2 mg/dL   Total Protein 6.3 6.0 - 8.5 g/dL   Albumin 4.2 3.7 - 4.7 g/dL   Globulin, Total 2.1 1.5 - 4.5 g/dL   Albumin/Globulin Ratio 2.0 1.2 - 2.2   Bilirubin Total 0.6 0.0 - 1.2 mg/dL   Alkaline Phosphatase 94 44 - 121 IU/L   AST 40 0 - 40 IU/L   ALT 29 0 - 44 IU/L  Lipid panel  Result Value Ref Range   Cholesterol, Total 169 100 - 199 mg/dL   Triglycerides 97 0 - 149 mg/dL   HDL 62 >39 mg/dL   VLDL Cholesterol Cal 18 5 - 40 mg/dL   LDL Chol Calc  (NIH) 89 0 - 99 mg/dL   Chol/HDL Ratio 2.7 0.0 - 5.0 ratio  Thyroid Panel With TSH  Result Value Ref Range   TSH 1.130 0.450 - 4.500 uIU/mL   T4, Total 11.0 4.5 - 12.0 ug/dL   T3 Uptake Ratio 25 24 - 39 %   Free Thyroxine Index 2.8 1.2 - 4.9  CBC with Differential/Platelet  Result Value Ref Range   WBC 6.8 3.4 - 10.8 x10E3/uL   RBC 4.41 4.14 - 5.80 x10E6/uL   Hemoglobin 13.2 13.0 - 17.7 g/dL   Hematocrit 40.9 37.5 - 51.0 %   MCV 93 79 - 97 fL   MCH 29.9 26.6 - 33.0 pg   MCHC 32.3 31.5 - 35.7 g/dL   RDW 12.2 11.6 - 15.4 %   Platelets 244 150 - 450 x10E3/uL   Neutrophils 73 Not Estab. %  Lymphs 14 Not Estab. %   Monocytes 11 Not Estab. %   Eos 1 Not Estab. %   Basos 1 Not Estab. %   Neutrophils Absolute 4.9 1.4 - 7.0 x10E3/uL   Lymphocytes Absolute 1.0 0.7 - 3.1 x10E3/uL   Monocytes Absolute 0.7 0.1 - 0.9 x10E3/uL   EOS (ABSOLUTE) 0.1 0.0 - 0.4 x10E3/uL   Basophils Absolute 0.1 0.0 - 0.2 x10E3/uL   Immature Granulocytes 0 Not Estab. %   Immature Grans (Abs) 0.0 0.0 - 0.1 x10E3/uL        Current Outpatient Medications:    allopurinol (ZYLOPRIM) 100 MG tablet, Take 100 mg by mouth daily., Disp: , Rfl:    aspirin 81 MG EC tablet, Take 1 tablet (81 mg total) by mouth daily. Swallow whole., Disp: 30 tablet, Rfl: 12   baclofen (LIORESAL) 10 MG tablet, Take 10 mg by mouth daily., Disp: , Rfl:    calcitRIOL (ROCALTROL) 0.25 MCG capsule, Take 0.25 mcg by mouth daily., Disp: , Rfl:    colchicine 0.6 MG tablet, Take 1 tablet (0.6 mg total) by mouth daily., Disp: 30 tablet, Rfl: 3   donepezil (ARICEPT) 10 MG tablet, Take half tablet (5 mg) daily for 2 weeks, then increase to the full tablet at 10 mg daily, Disp: 30 tablet, Rfl: 11   folic acid (FOLVITE) 1 MG tablet, Take 1 mg by mouth daily., Disp: , Rfl:    hydrALAZINE (APRESOLINE) 50 MG tablet, Take 0.5 tablets (25 mg total) by mouth in the morning and at bedtime., Disp: 30 tablet, Rfl: 3   pantoprazole (PROTONIX) 20 MG tablet, Take  1 tablet (20 mg total) by mouth daily., Disp: 30 tablet, Rfl: 4   pravastatin (PRAVACHOL) 40 MG tablet, Take 40 mg by mouth daily., Disp: , Rfl:    verapamil (CALAN-SR) 240 MG CR tablet, Take 1 tablet (240 mg total) by mouth daily., Disp: 30 tablet, Rfl: 4    Assessment & Plan:   HTN: seen cardiology : is on hydralazine 25 mg twice daily, continue verapamil, toprol xl was stopped by cards as pt wasn't taking verapamil when he was seen here.   2. GERD is on protonix for such  GERD continue pantoprazole 40 mg q.day as prescribed patient advised to avoid laying down soon after his meals. He took a 2 hours between dinner and bedtime. Avoid spicy food and triggers that he knows food wise that worsen his acid reflux. Patient verbalized understanding of the above. Lifestyle modifications as above discussed with patient.  3. HLD :  Is on pravastatin for such recheck FLP, check LFT's work on diet, SE of meds explained to pt. low fat and high fiber diet explained to pt.   4. Dementia is seeing [place] [facility] for such  on aricept 5 mg daily.  5. Elevated HR at 128 seen cards,much improved. Sinus tachycardia, likely from interrupting/stopping verapamil per cards notes . Pt was place dback on Verapamil and HR better.   Problem List Items Addressed This Visit   None Visit Diagnoses     Need for influenza vaccination    -  Primary   Need for pneumococcal vaccination            No orders of the defined types were placed in this encounter.    No orders of the defined types were placed in this encounter.    Follow up plan: No follow-ups on file.

## 2021-08-28 ENCOUNTER — Other Ambulatory Visit: Payer: Self-pay | Admitting: Internal Medicine

## 2021-08-28 NOTE — Telephone Encounter (Signed)
Requested medication (s) are due for refill today:Amount not specified  Requested medication (s) are on the active medication list: yes  Last refill: 04/22/21  Future visit scheduled yes 09/24/21  Notes to clinic: not delegated, historical provider  Requested Prescriptions  Pending Prescriptions Disp Refills   baclofen (LIORESAL) 10 MG tablet [Pharmacy Med Name: BACLOFEN 10 MG TABLET] 90 tablet     Sig: TAKE 1 TABLET BY MOUTH EVERY DAY AS NEEDED     Not Delegated - Analgesics:  Muscle Relaxants Failed - 08/28/2021  4:16 PM      Failed - This refill cannot be delegated      Passed - Valid encounter within last 6 months    Recent Outpatient Visits           Yesterday Need for influenza vaccination   [facility] [name], [name], MD   2 weeks ago Tachycardia   [facility] [name], [name], MD   2 months ago Gout, unspecified cause, unspecified chronicity, unspecified site   [facility] [name], [name], MD   4 months ago 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

## 2021-08-31 ENCOUNTER — Other Ambulatory Visit: Payer: Self-pay | Admitting: Internal Medicine

## 2021-08-31 NOTE — Telephone Encounter (Signed)
Pts wife is calling to check on the status of the medication refill. Pts wife states that all scripts have been transferred to Dr. Neomia Dear. Pt has 3 pills left. CB- [phone]

## 2021-09-01 ENCOUNTER — Other Ambulatory Visit: Payer: Self-pay | Admitting: Internal Medicine

## 2021-09-01 NOTE — Telephone Encounter (Signed)
Requested medication (s) are due for refill today: yes  Requested medication (s) are on the active medication list: yes  Last refill:  06/25/21  Future visit scheduled: 09/24/21  Notes to clinic:  Historical Provider, please assess. Wife stated all rx have been transferred into Dr. Levada Dy name.  Requested Prescriptions  Pending Prescriptions Disp Refills   pravastatin (PRAVACHOL) 40 MG tablet [Pharmacy Med Name: PRAVASTATIN SODIUM 40 MG TAB] 30 tablet     Sig: TAKE 1 TABLET BY MOUTH EVERY DAY     Cardiovascular:  Antilipid - Statins Passed - 08/31/2021 10:06 AM      Passed - Total Cholesterol in normal range and within 360 days    Cholesterol, Total  Date Value Ref Range Status  08/06/2021 169 100 - 199 mg/dL Final          Passed - LDL in normal range and within 360 days    LDL Chol Calc (NIH)  Date Value Ref Range Status  08/06/2021 89 0 - 99 mg/dL Final          Passed - HDL in normal range and within 360 days    HDL  Date Value Ref Range Status  08/06/2021 62 >39 mg/dL Final          Passed - Triglycerides in normal range and within 360 days    Triglycerides  Date Value Ref Range Status  08/06/2021 97 0 - 149 mg/dL Final          Passed - Patient is not pregnant      Passed - Valid encounter within last 12 months    Recent Outpatient Visits           5 days ago Need for influenza vaccination   [place] [name], [name], MD   2 weeks ago Tachycardia   [facility] [name], [name], MD   2 months ago Gout, unspecified cause, unspecified chronicity, unspecified site   [facility] [name], [name], MD   4 months ago 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

## 2021-09-03 ENCOUNTER — Other Ambulatory Visit: Payer: Self-pay

## 2021-09-03 MED ORDER — ALLOPURINOL 100 MG PO TABS
100.0000 mg | ORAL_TABLET | Freq: Every day | ORAL | 6 refills | Status: DC
  Filled 2021-09-03: qty 30, 30d supply, fill #0

## 2021-09-03 MED ORDER — FOLIC ACID 1 MG PO TABS
1.0000 mg | ORAL_TABLET | Freq: Every day | ORAL | 3 refills | Status: DC
  Filled 2021-09-03: qty 30, 30d supply, fill #0

## 2021-09-03 MED ORDER — PRAVASTATIN SODIUM 40 MG PO TABS
40.0000 mg | ORAL_TABLET | Freq: Every day | ORAL | 5 refills | Status: DC
  Filled 2021-09-03: qty 30, 30d supply, fill #0

## 2021-09-03 MED ORDER — CALCITRIOL 0.25 MCG PO CAPS
0.2500 ug | ORAL_CAPSULE | Freq: Every day | ORAL | 4 refills | Status: DC
  Filled 2021-09-03: qty 30, 30d supply, fill #0

## 2021-09-04 ENCOUNTER — Other Ambulatory Visit
Admission: RE | Admit: 2021-09-04 | Discharge: 2021-09-04 | Disposition: A | Discharge status: Home / Self Care | Payer: Federal, State, Local not specified - PPO | Attending: Urology | Admitting: Urology

## 2021-09-04 ENCOUNTER — Other Ambulatory Visit: Payer: Self-pay | Admitting: *Deleted

## 2021-09-04 DIAGNOSIS — R972 Elevated prostate specific antigen [PSA]: Secondary | ICD-10-CM | POA: Insufficient documentation

## 2021-09-05 ENCOUNTER — Telehealth: Payer: Self-pay

## 2021-09-05 DIAGNOSIS — R972 Elevated prostate specific antigen [PSA]: Secondary | ICD-10-CM

## 2021-09-05 LAB — PSA (REFLEX TO FREE) (SERIAL): Prostate Specific Ag, Serum: 47.5 ng/mL — ABNORMAL HIGH (ref 0.0–4.0)

## 2021-09-05 NOTE — Telephone Encounter (Signed)
-----   Message from Billey Co, MD sent at 09/05/2021  1:09 PM EST ----- PSA remains elevated, would recommend prostate MRI as we discussed in clinic previously.  Please order prostate MRI with follow-up to review results in person  Nickolas Madrid, MD 09/05/2021

## 2021-09-05 NOTE — Telephone Encounter (Signed)
Pts wife called and all the pts medication went to the wrong pharmacy / she is hoping they can be sent to the correct pharmacy today / she stated that the mychart drop down didn't give her the option of choosing the correct pharmacy so [facility] each medication name / please resend RX for allopurinol (ZYLOPRIM) 076 MG tablet  folic acid (FOLVITE) 1 MG tablet  pravastatin (PRAVACHOL) 40 MG tablet  calcitRIOL (ROCALTROL) 0.25 MCG capsule [facility]  [id number] [place] [place] - [address] Phone:  [phone]  Fax:  [phone]

## 2021-09-05 NOTE — Telephone Encounter (Signed)
Called pt no answer. Left detailed message per DPR. MRI ordered. Appt scheduled.  

## 2021-09-06 MED ORDER — ALLOPURINOL 100 MG PO TABS: 100.0000 mg | ORAL_TABLET | Freq: Every day | ORAL | 6 refills | Status: DC

## 2021-09-06 NOTE — Telephone Encounter (Signed)
Requested Prescriptions  Pending Prescriptions Disp Refills   allopurinol (ZYLOPRIM) 100 MG tablet 30 tablet 6    Sig: Take 1 tablet (100 mg total) by mouth daily.     Endocrinology:  Gout Agents Failed - 09/06/2021  5:24 PM      Failed - Cr in normal range and within 360 days    Creatinine, Ser  Date Value Ref Range Status  08/06/2021 2.06 (H) 0.76 - 1.27 mg/dL Final         Passed - Uric Acid in normal range and within 360 days    Uric Acid  Date Value Ref Range Status  06/12/2021 6.8 3.8 - 8.4 mg/dL Final    Comment:               Therapeutic target for gout patients: <6.0         Passed - Valid encounter within last 12 months    Recent Outpatient Visits          1 week ago Need for influenza vaccination   [employer] [name], [name], MD   3 weeks ago Tachycardia   [facility] [name], [name], MD   2 months ago Gout, unspecified cause, unspecified chronicity, unspecified site   [facility] [name], [name], MD   4 months ago 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     Endocrinology:  Gout Agents Failed - 09/03/2021 10:59 AM      Failed - Cr in normal range and within 360 days    Creatinine, Ser  Date Value Ref Range Status  08/06/2021 2.06 (H) 0.76 - 1.27 mg/dL Final         Passed - Uric Acid in normal range and within 360 days    Uric Acid  Date Value Ref Range Status  06/12/2021 6.8 3.8 - 8.4 mg/dL Final    Comment:               Therapeutic target for gout patients: <6.0         Passed - Valid encounter within last 12 months    Recent Outpatient Visits          1  week ago Need for influenza vaccination   [place] [name], [name], MD   3 weeks ago Tachycardia   [facility] [name], [name], MD   2 months ago Gout, unspecified cause, unspecified chronicity, unspecified site   [facility] [name], [name], MD   4 months ago 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  Endocrinology:  Vitamins Passed - 09/03/2021 10:59 AM      Passed - Valid encounter within last 12 months    Recent Outpatient Visits          1 week ago Need for influenza vaccination   [place] [name], [name], MD   3 weeks ago Tachycardia   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     Cardiovascular:  Antilipid - Statins Passed - 09/03/2021 10:59 AM      Passed - Total Cholesterol in normal range and within 360 days    Cholesterol, Total  Date Value Ref Range Status  08/06/2021 169 100 - 199 mg/dL Final         Passed - LDL in normal range and within 360 days    LDL Chol Calc (NIH)  Date Value Ref Range Status  08/06/2021 89 0 - 99 mg/dL Final         Passed - HDL in normal range and within 360 days     HDL  Date Value Ref Range Status  08/06/2021 62 >39 mg/dL Final         Passed - Triglycerides in normal range and within 360 days    Triglycerides  Date Value Ref Range Status  08/06/2021 97 0 - 149 mg/dL Final         Passed - Patient is not pregnant      Passed - Valid encounter within last 12 months    Recent Outpatient Visits          1 week ago Need for influenza vaccination   [place] [name], [name], MD   3 weeks ago Tachycardia   [facility] [name], [name], MD   2 months ago Gout, unspecified cause, unspecified chronicity, unspecified site   [facility] [name], [name], MD   4 months ago 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, [facility]   In 3 weeks Agbor-Etang, Aaron Edelman, MD [place], [facility]            calcitRIOL (ROCALTROL) 0.25 MCG capsule 30 capsule 4    Sig: Take 1 capsule (0.25 mcg total) by mouth daily.     Endocrinology:  Vitamins - Vitamin D Supplementation Failed - 09/03/2021 10:59 AM      Failed - 50,000 IU strengths are not delegated      Failed - Phosphate in normal range and within 360 days    No results found for: PHOS       Failed - Vitamin D in normal range and within 360 days    No results found for: [id number], [id number], [id number], [place]bert, Carrizo, [place], 25OHVITD2,  25OHVITD1, 25OHVITD2, 25OHVITD3, VD25OH       Passed - Ca in normal range and within 360 days    Calcium  Date Value Ref Range Status  08/06/2021 10.0 8.6 - 10.2 mg/dL Final         Passed - Valid encounter within last 12 months    Recent Outpatient Visits          1 week ago Need for influenza vaccination   [place] [name], [name], MD   3 weeks ago Tachycardia   [facility] [name], [name], MD   2 months ago Gout, unspecified cause, unspecified  chronicity, unspecified site   [facility] [name], [name], MD   4 months ago 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

## 2021-09-06 NOTE — Telephone Encounter (Signed)
Patients wife called again about having patients meds sent to the correct pharmacy of,  [id number] [place], [address] Phone:  [phone]  Fax:  [phone]

## 2021-09-07 ENCOUNTER — Other Ambulatory Visit: Payer: Self-pay

## 2021-09-11 ENCOUNTER — Other Ambulatory Visit: Payer: Self-pay

## 2021-09-11 MED ORDER — CALCITRIOL 0.25 MCG PO CAPS: 0.2500 ug | ORAL_CAPSULE | Freq: Every day | ORAL | 4 refills | Status: DC

## 2021-09-11 MED ORDER — PRAVASTATIN SODIUM 40 MG PO TABS: 40.0000 mg | ORAL_TABLET | Freq: Every day | ORAL | 4 refills | Status: DC

## 2021-09-12 ENCOUNTER — Ambulatory Visit: Payer: Federal, State, Local not specified - PPO | Admitting: Physician Assistant

## 2021-09-15 ENCOUNTER — Ambulatory Visit
Admission: RE | Admit: 2021-09-15 | Discharge: 2021-09-15 | Disposition: A | Discharge status: Home / Self Care | Payer: Federal, State, Local not specified - PPO | Source: Ambulatory Visit | Attending: Urology | Admitting: Urology

## 2021-09-15 ENCOUNTER — Other Ambulatory Visit: Payer: Self-pay

## 2021-09-15 DIAGNOSIS — R972 Elevated prostate specific antigen [PSA]: Secondary | ICD-10-CM | POA: Insufficient documentation

## 2021-09-15 MED ORDER — GADOBUTROL 1 MMOL/ML IV SOLN
9.0000 mL | Freq: Once | INTRAVENOUS | Status: AC | PRN
  Administered 2021-09-15: 10 mL via INTRAVENOUS

## 2021-09-21 ENCOUNTER — Other Ambulatory Visit: Payer: Self-pay | Admitting: Internal Medicine

## 2021-09-21 NOTE — Telephone Encounter (Signed)
Requested medication (s) are due for refill today: yes  Requested medication (s) are on the active medication list: yes  Last refill:  08/29/21 #30/0  Future visit scheduled: yes  Notes to clinic:  Unable to refill per protocol, cannot delegate.      Requested Prescriptions  Pending Prescriptions Disp Refills   baclofen (LIORESAL) 10 MG tablet [Pharmacy Med Name: BACLOFEN 10 MG TABLET] 30 tablet 0    Sig: TAKE 1 TABLET BY MOUTH EVERY DAY AS NEEDED     Not Delegated - Analgesics:  Muscle Relaxants Failed - 09/21/2021 10:30 AM      Failed - This refill cannot be delegated      Passed - Valid encounter within last 6 months    Recent Outpatient Visits           3 weeks ago Need for influenza vaccination   [facility] [name], [name], MD   1 month ago Tachycardia   [facility] [name], [name], MD   3 months ago Gout, unspecified cause, unspecified chronicity, unspecified site   [facility] [name], [name], MD   4 months ago 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

## 2021-09-24 ENCOUNTER — Ambulatory Visit: Payer: Federal, State, Local not specified - PPO | Admitting: Internal Medicine

## 2021-09-25 ENCOUNTER — Encounter: Payer: Self-pay | Admitting: Urology

## 2021-09-25 ENCOUNTER — Ambulatory Visit: Payer: Federal, State, Local not specified - PPO | Admitting: Urology

## 2021-09-25 ENCOUNTER — Other Ambulatory Visit: Payer: Self-pay

## 2021-09-25 VITALS — BP 138/64 | HR 72 | Ht 72.0 in | Wt 198.0 lb

## 2021-09-25 DIAGNOSIS — R972 Elevated prostate specific antigen [PSA]: Secondary | ICD-10-CM

## 2021-09-25 NOTE — Patient Instructions (Signed)

## 2021-09-25 NOTE — Progress Notes (Signed)
° °  09/25/2021 12:16 PM   Jason Estrada [birth year] [id number]  Reason for visit: Follow up elevated PSA  HPI: 81 year old male with dementia, hypertension, and CKD with recent creatinine of 2.1(eGFR 32) who was hospitalized for gout and had a PSA drawn a week after that hospitalization that was significantly elevated at 57.3.  There were no prior PSA values to review.  DRE showed a 50 g prostate with no nodules or masses.  Repeat PSA decreased to 41 on September 8th 2022, and subsequently decreased to 33 on 06/29/2021.  Plan was to trend to normal.  PSA 09/04/2021 was significantly elevated at 47.5, and he opted for a prostate MRI.  I personally viewed and interpreted the prostate MRI that shows a PI-RADS 5 lesion in the right anterior mid gland tracking towards the base with some suspected early extracapsular extension at the right lateral margin of the prostate.  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  No lymphadenopathy or bony lesion in the pelvis.  We discussed options moving forward with his comorbidities including watchful waiting or prostate biopsy.  With the significantly elevated PSA and suspicious MRI findings, I recommended biopsy.  He would potentially be a candidate for ADT and radiation pending pathology findings.  Risks of prostate biopsy include bleeding, infection (including life threatening sepsis), pain, and lower urinary symptoms. Hematuria, hematospermia, and blood in the stool are all common after biopsy and can persist up to 4 weeks.   Schedule prostate biopsy  Billey Co, MD  [facility] [address], [place], [place] [address] [phone]

## 2021-09-26 DIAGNOSIS — R000 Tachycardia, unspecified: Secondary | ICD-10-CM | POA: Insufficient documentation

## 2021-09-26 DIAGNOSIS — I10 Essential (primary) hypertension: Secondary | ICD-10-CM | POA: Insufficient documentation

## 2021-09-26 DIAGNOSIS — F0390 Unspecified dementia without behavioral disturbance: Secondary | ICD-10-CM | POA: Insufficient documentation

## 2021-09-26 DIAGNOSIS — Z23 Encounter for immunization: Secondary | ICD-10-CM | POA: Insufficient documentation

## 2021-09-26 DIAGNOSIS — E785 Hyperlipidemia, unspecified: Secondary | ICD-10-CM | POA: Insufficient documentation

## 2021-09-26 HISTORY — DX: Tachycardia, unspecified: R00.0

## 2021-09-27 ENCOUNTER — Ambulatory Visit: Payer: Federal, State, Local not specified - PPO | Admitting: Urology

## 2021-09-28 ENCOUNTER — Encounter: Payer: Self-pay | Admitting: Cardiology

## 2021-09-28 ENCOUNTER — Ambulatory Visit: Payer: Federal, State, Local not specified - PPO | Admitting: Cardiology

## 2021-09-28 ENCOUNTER — Other Ambulatory Visit: Payer: Self-pay

## 2021-09-28 VITALS — BP 130/58 | HR 67 | Ht 72.0 in | Wt 199.4 lb

## 2021-09-28 DIAGNOSIS — I10 Essential (primary) hypertension: Secondary | ICD-10-CM | POA: Diagnosis not present

## 2021-09-28 DIAGNOSIS — R000 Tachycardia, unspecified: Secondary | ICD-10-CM

## 2021-09-28 DIAGNOSIS — E7800 Pure hypercholesterolemia, unspecified: Secondary | ICD-10-CM

## 2021-09-28 DIAGNOSIS — Z01818 Encounter for other preprocedural examination: Secondary | ICD-10-CM

## 2021-09-28 DIAGNOSIS — Z01810 Encounter for preprocedural cardiovascular examination: Secondary | ICD-10-CM | POA: Diagnosis not present

## 2021-09-28 NOTE — Addendum Note (Signed)
Addended by: Kate Sable on: 09/28/2021 05:17 PM   Modules accepted: Level of Service

## 2021-09-28 NOTE — Progress Notes (Addendum)
Cardiology Office Note:    Date:  09/28/2021   ID:  Jason Estrada, DOB 1941-07-02, MRN [id number]  PCP:  Charlynne Cousins, MD   [facility] Providers Cardiologist:  None     Referring MD: Charlynne Cousins, MD   No chief complaint on file.   History of Present Illness:    Jason Estrada is a 81 y.o. male with a hx of hypertension, hyperlipidemia, CKD 3, dementia who presents for follow-up.  Previously seen due to elevated heart rates and low normal BPs.  Medications were adjusted, elevated heart rates themselves likely from stopping verapamil, this was restarted.  Hydralazine dose was reduced to 25 mg twice daily.  Toprol-XL stopped.  His heart rates have been normal, BP also normal.  He feels well, has no concerns at this time.  Planning on getting a prostate biopsy in the near future.   Past Medical History:  Diagnosis Date   Dementia ([place])    Gout    Hypertension    Renal disorder     Past Surgical History:  Procedure Laterality Date   CHOLECYSTECTOMY      Current Medications: Current Meds  Medication Sig   allopurinol (ZYLOPRIM) 100 MG tablet Take 1 tablet (100 mg total) by mouth daily.   baclofen (LIORESAL) 10 MG tablet TAKE 1 TABLET BY MOUTH EVERY DAY AS NEEDED   calcitRIOL (ROCALTROL) 0.25 MCG capsule Take 1 capsule (0.25 mcg total) by mouth daily.   colchicine 0.6 MG tablet Take 1 tablet (0.6 mg total) by mouth daily.   donepezil (ARICEPT) 10 MG tablet Take half tablet (5 mg) daily for 2 weeks, then increase to the full tablet at 10 mg daily   folic acid (FOLVITE) 1 MG tablet Take 1 tablet (1 mg total) by mouth daily.   hydrALAZINE (APRESOLINE) 50 MG tablet Take 0.5 tablets (25 mg total) by mouth in the morning and at bedtime.   pantoprazole (PROTONIX) 20 MG tablet Take 1 tablet (20 mg total) by mouth daily.   pravastatin (PRAVACHOL) 40 MG tablet Take 1 tablet (40 mg total) by mouth daily.   verapamil (CALAN-SR) 240 MG CR tablet Take 1 tablet (240 mg total) by mouth  daily.     Allergies:   Patient has no known allergies.   Social History   Socioeconomic History   Marital status: Married    Spouse name: Not on file   Number of children: Not on file   Years of education: 16   Highest education level: Not on file  Occupational History   Not on file  Tobacco Use   Smoking status: Never   Smokeless tobacco: Never  Vaping Use   Vaping Use: Never used  Substance and Sexual Activity   Alcohol use: Not Currently   Drug use: Not Currently   Sexual activity: Not Currently  Other Topics Concern   Not on file  Social History Narrative   Right handed   Drinks caffeine   One story home   Social Determinants of Health   Financial Resource Strain: Not on file  Food Insecurity: Not on file  Transportation Needs: Not on file  Physical Activity: Not on file  Stress: Not on file  Social Connections: Not on file     Family History: The patient's family history is not on file.  ROS:   Please see the history of present illness.     All other systems reviewed and are negative.  EKGs/Labs/Other Studies Reviewed:    The following studies  were reviewed today:   EKG:  EKG is  ordered today.  The ekg ordered today demonstrates normal sinus rhythm, normal ECG .  Recent Labs: 08/06/2021: ALT 29; BUN 25; Creatinine, Ser 2.06; Hemoglobin 13.2; Platelets 244; Potassium 4.5; Sodium 143; TSH 1.130  Recent Lipid Panel    Component Value Date/Time   CHOL 169 08/06/2021 1018   TRIG 97 08/06/2021 1018   HDL 62 08/06/2021 1018   CHOLHDL 2.7 08/06/2021 1018   LDLCALC 89 08/06/2021 1018     Risk Assessment/Calculations:          Physical Exam:    VS:  BP (!) 130/58    Pulse 67    Ht 6' (1.829 m)    Wt 199 lb 6.4 oz (90.4 kg)    SpO2 94%    BMI 27.04 kg/m     Wt Readings from Last 3 Encounters:  09/28/21 199 lb 6.4 oz (90.4 kg)  09/25/21 198 lb (89.8 kg)  08/27/21 198 lb 12.8 oz (90.2 kg)     GEN:  Well nourished, well developed in no  acute distress HEENT: Normal NECK: No JVD; No carotid bruits CARDIAC: RRR, no murmurs, rubs, gallops RESPIRATORY:  Clear to auscultation without rales, wheezing or rhonchi  ABDOMEN: Soft, non-tender, non-distended MUSCULOSKELETAL:  No edema; No deformity  SKIN: Warm and dry NEUROLOGIC:  Alert and oriented x 3 PSYCHIATRIC:  Normal affect   ASSESSMENT:    1. Primary hypertension   2. Sinus tachycardia   3. Pure hypercholesterolemia   4. Pre-op evaluation    PLAN:    In order of problems listed above:  Hypertension, BP improved.  Continue hydralazine 25 mg twice daily, continue verapamil.  Sinus tachycardia, likely from interrupting/stopping verapamil.  Resolved with restarting verapamil  240 mg daily  Hyperlipidemia, cholesterol controlled, continue Pravachol. Preop evaluation, patient planning on cleaning prostate biopsy in the near future.  Urological procedures are considered low cardiac risk.  Okay to proceed from a cardiac perspective.  Follow-up as needed       Medication Adjustments/Labs and Tests Ordered: Current medicines are reviewed at length with the patient today.  Concerns regarding medicines are outlined above.  Orders Placed This Encounter  Procedures   EKG 12-Lead    No orders of the defined types were placed in this encounter.    Patient Instructions  Medication Instructions:  Your physician recommends that you continue on your current medications as directed. Please refer to the Current Medication list given to you today.  *If you need a refill on your cardiac medications before your next appointment, please call your pharmacy*   Lab Work: None ordered If you have labs (blood work) drawn today and your tests are completely normal, you will receive your results only by: [place] (if you have MyChart) OR A paper copy in the mail If you have any lab test that is abnormal or we need to change your treatment, we will call you to review the  results.   Testing/Procedures: None ordered   Follow-Up: At [facility], you and your health needs are our priority.  As part of our continuing mission to provide you with exceptional heart care, we have created designated Provider Care Teams.  These Care Teams include your primary Cardiologist (physician) and Advanced Practice Providers (APPs -  Physician Assistants and Nurse Practitioners) who all work together to provide you with the care you need, when you need it.  We recommend signing up for the patient portal called "  MyChart".  Sign up information is provided on this After Visit Summary.  MyChart is used to connect with patients for Virtual Visits (Telemedicine).  Patients are able to view lab/test results, encounter notes, upcoming appointments, etc.  Non-urgent messages can be sent to your provider as well.   To learn more about what you can do with MyChart, go to [email or web address].    Your next appointment:   Follow up as needed   The format for your next appointment:   In Person  Provider:   You may see Kate Sable, MD or one of the following Advanced Practice Providers on your designated Care Team:   Murray Hodgkins, NP Christell Faith, PA-C Cadence Kathlen Mody, [place]    Other Instructions     Signed, Kate Sable, MD  09/28/2021 5:17 PM    [place]

## 2021-09-28 NOTE — Patient Instructions (Signed)

## 2021-10-02 ENCOUNTER — Other Ambulatory Visit: Payer: Self-pay | Admitting: Internal Medicine

## 2021-10-02 NOTE — Telephone Encounter (Signed)
Requested medication (s) are due for refill today: BAclofen yes  colchicine 10/12/21  Requested medication (s) are on the active medication list: Yes   Last refill: Baclofen 08/29/21  #30  0 refills    Colchicine  06/12/21 #30  3 refills  Future visit scheduled yes 10/22/21  Notes to clinic:BAclofen not delegated.  Colchicine failed due to labs, please review  Requested Prescriptions  Pending Prescriptions Disp Refills   baclofen (LIORESAL) 10 MG tablet [Pharmacy Med Name: BACLOFEN 10 MG TABLET] 30 tablet 0    Sig: TAKE 1 TABLET BY MOUTH EVERY DAY AS NEEDED     Not Delegated - Analgesics:  Muscle Relaxants Failed - 10/02/2021  4:33 PM      Failed - This refill cannot be delegated      Passed - Valid encounter within last 6 months    Recent Outpatient Visits           1 month ago Need for influenza vaccination   [facility] [name], [name], MD   1 month ago [place] [name], [name], MD   3 months ago Gout, unspecified cause, unspecified chronicity, unspecified site   [facility] [name], [name], MD   5 months ago Stage 4 chronic kidney disease ([place])   [facility] [name], [name], MD       Future Appointments             In 2 weeks [name], [name], MD [place], [facility]             colchicine 0.6 MG tablet [Pharmacy Med Name: COLCHICINE 0.6 MG TABLET] 90 tablet 1    Sig: TAKE 1 TABLET BY [place]     Endocrinology:  Gout Agents Failed - 10/02/2021  4:33 PM      Failed - Cr in normal range and within 360 days    Creatinine, Ser  Date Value Ref Range Status  08/06/2021 2.06 (H) 0.76 - 1.27 mg/dL Final          Passed - Uric Acid in normal range and within 360 days    Uric Acid  Date Value Ref Range Status  06/12/2021 6.8 3.8 - 8.4 mg/dL Final    Comment:               Therapeutic target for gout patients: <6.0          Passed - Valid encounter within last 12 months    Recent Outpatient  Visits           1 month ago Need for influenza vaccination   [place] [name], [name], MD   1 month ago Tachycardia   [place], MD   3 months ago Gout, unspecified cause, unspecified chronicity, unspecified site   [facility] [name], [name], MD   5 months ago Stage 4 chronic kidney disease ([place])   [place], [name], MD       Future Appointments             In 2 weeks [name], [name], MD [facility], [facility]

## 2021-10-03 ENCOUNTER — Ambulatory Visit (INDEPENDENT_AMBULATORY_CARE_PROVIDER_SITE_OTHER): Payer: Federal, State, Local not specified - PPO | Admitting: Psychology

## 2021-10-03 ENCOUNTER — Encounter: Payer: Self-pay | Admitting: Psychology

## 2021-10-03 ENCOUNTER — Other Ambulatory Visit: Payer: Self-pay

## 2021-10-03 ENCOUNTER — Ambulatory Visit: Payer: Federal, State, Local not specified - PPO | Admitting: Psychology

## 2021-10-03 DIAGNOSIS — F0280 Dementia in other diseases classified elsewhere without behavioral disturbance: Secondary | ICD-10-CM | POA: Diagnosis not present

## 2021-10-03 DIAGNOSIS — K263 Acute duodenal ulcer without hemorrhage or perforation: Secondary | ICD-10-CM | POA: Insufficient documentation

## 2021-10-03 DIAGNOSIS — G309 Alzheimer's disease, unspecified: Secondary | ICD-10-CM

## 2021-10-03 DIAGNOSIS — R4189 Other symptoms and signs involving cognitive functions and awareness: Secondary | ICD-10-CM

## 2021-10-03 NOTE — Progress Notes (Signed)
NEUROPSYCHOLOGICAL EVALUATION [place]  Date of Evaluation: October 03, 2021  Reason for Referral:   Jason Estrada is a 81 y.o. right-handed Caucasian male referred by  Sharene Butters, PA-C , to characterize his current cognitive functioning and assist with diagnostic clarity and treatment planning in the context of subjective cognitive decline and concern for an underlying neurodegenerative illness.   Assessment and Plan:   Clinical Impression(s): Mr. Baldyga pattern of performance is suggestive of severe impairment across all aspects of learning and memory. Additional impairment was exhibited across semantic fluency and an isolated line orientation task, while performance variability was exhibited across executive functioning. Performances were generally adequate relative to age-matched peers across domains of processing speed, attention/concentration, safety/judgment, receptive language, confrontation naming, and visuoconstructional abilities. Regarding ADLs, his wife manages finances and bill paying responsibilities and noted that while Jason Estrada still drives, he does seem to have greater difficulty with navigation and has become reliant on his GPS. She noted that he manages his medications and has been doing an adequate job when she double checks him. 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 However, he is still towards the mild end of this spectrum presently.   Regarding etiology, I have notable concerns surrounding the presence of Alzheimer's disease and feel that this represents the most likely culprit for ongoing cognitive decline. Jason Estrada did not benefit from repeated exposure to information across learning trials, was essentially amnestic across all memory tasks after a short  delay, and generally performed below expectation across yes/no recognition tasks. All of which suggests the presence of rapid forgetting and an evolving and already fairly significant memory storage deficit, both of which are hallmark characteristics of Alzheimer's disease. Further impairment in semantic fluency is consistent with this disease process and its normal progression. His wife's reporting of him rapidly forgetting information and repeating himself often also aligns well with Alzheimer's disease. Given that he demonstrated limited recognition across some memory tasks and intact confrontation naming abilities, he appears to be in earlier stages of this illness presently.  Behaviorally, he does not exhibit common characteristics of Lewy body dementia, frontotemporal dementia, or other more rare parkinsonian conditions. Recent neuroimaging revealed moderate small vessel ischemic changes, which could certainly exacerbate cognitive decline. However, this would not account for this degree of memory impairment in isolation. Continued medical monitoring will be important moving forward.  Recommendations: A repeat neuropsychological evaluation in 18-24 months (or sooner if functional decline is noted) is recommended to assess the trajectory of future cognitive decline should it occur. This will also aid in future efforts towards improved diagnostic clarity.  Jason Estrada has already been prescribed a medication aimed to address memory loss and concerns surrounding Alzheimer's disease (i.e., donepezil/Aricept). He is encouraged to continue taking this medication as prescribed. It is important to highlight that this medication has been shown to slow functional decline in some individuals. There is no current treatment which can stop or reverse cognitive decline when caused by a neurodegenerative illness.   Should there be further progression of deficits over time, he is unlikely to regain any independent  living skills lost. Therefore, it is recommended that he remain as involved as possible in all aspects of household chores, finances, and medication management, with supervision to ensure adequate performance. He will likely benefit from the establishment and maintenance of a  routine in order to maximize his functional abilities over time.  It will be important for Jason Estrada to have another person with him when in situations where he may need to process information, weigh the pros and cons of different options, and make decisions, in order to ensure that he fully understands and recalls all information to be considered.  If not already done, Jason Estrada and his family may want to discuss his wishes regarding durable power of attorney and medical decision making, so that he can have input into these choices. Additionally, they may wish to discuss future plans for caretaking and seek out community options for in home/residential care should they become necessary.  Performance across neurocognitive testing is not a strong predictor of an individual's safety operating a motor vehicle. Should his family wish to pursue a formalized driving evaluation, they could reach out to the following agencies: [employer] in [place]: [phone] Driver Rehabil[facility] Services: [facility]: [place]: [phone] or [phone]  Jason Estrada is encouraged to attend to lifestyle factors for brain health (e.g., regular physical exercise, good nutrition habits, regular participation in cognitively-stimulating activities, and general stress management techniques), which are likely to have benefits for both emotional adjustment and cognition. Optimal control of vascular risk factors (including safe cardiovascular exercise and adherence to dietary recommendations) is encouraged. Continued participation in activities which provide mental stimulation and social  interaction is also recommended.   Important information should be provided to Jason Estrada in written format in all instances. This information should be placed in a highly frequented and easily visible location within his home to promote recall. External strategies such as written notes in a consistently used memory journal, visual and nonverbal auditory cues such as a calendar on the refrigerator or appointments with alarm, such as on a cell phone, can also help maximize recall.  To address problems with fluctuating attention, he may wish to consider:   -Avoiding external distractions when needing to concentrate   -Limiting exposure to fast paced environments with multiple sensory demands   -Writing down complicated information and using checklists   -Attempting and completing one task at a time (i.e., no multi-tasking)   -Verbalizing aloud each step of a task to maintain focus   -Reducing the amount of information considered at one time  Review of Records:   Jason Estrada was most recently seen by [facility] Sharene Butters, PA-C) on 08/15/2021 for an evaluation of memory loss. When asked about memory, he described these abilities as "pretty good." Per his wife, he will repeat the same sentences and questions and cannot keep time frames. She noted some behavioral changes in that he has developed somewhat of an obsession with the trash in that he will look for things inside the trash can and sometimes take garbage back into the house. She noted that he will insist on placing dirty food canisters and glasses back into the cabinet, including previously used paper plates. His wife noted hygiene concerns in that he seems reluctant to take a shower and has gone two weeks without bathing before. His wife manages more instrumental ADLs. While he continues to drive, navigational difficulties have increased, even while using a GPS. No mood concerns were noted. Sleep was descried as good. They denied  hallucinations, REM behaviors, or paranoia. There are times where he may confuse nighttime and daytime and want to eat breakfast at midnight. He has some urinary hesitancy due to prostate issues. He denied ongoing headaches,  double vision, dizziness, focal numbness or tingling, unilateral weakness or tremors, constipation or diarrhea, or anosmia. Performance on a brief cognitive screening instrument (MOCA) on 06/04/2021 was 17/30. Ultimately, Jason Estrada was referred for a comprehensive neuropsychological evaluation to characterize his cognitive abilities and to assist with diagnostic clarity and treatment planning.   Brain MRI on 06/20/2021 revealed moderate small vessel ischemic changes, as well as mild to moderate generalized cerebral atrophy.   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   Hyperlipidemia 12/16/2018   Iron deficiency anemia 02/06/2014   Sinus tachycardia 09/26/2021    Past Surgical History:  Procedure Laterality Date   CHOLECYSTECTOMY      Current Outpatient Medications:    allopurinol (ZYLOPRIM) 100 MG tablet, Take 1 tablet (100 mg total) by mouth daily., Disp: 30 tablet, Rfl: 6   baclofen (LIORESAL) 10 MG tablet, TAKE 1 TABLET BY MOUTH EVERY DAY AS NEEDED, Disp: 30 tablet, Rfl: 0   calcitRIOL (ROCALTROL) 0.25 MCG capsule, Take 1 capsule (0.25 mcg total) by mouth daily., Disp: 30 capsule, Rfl: 4   colchicine 0.6 MG tablet, TAKE 1 TABLET BY MOUTH EVERY DAY, Disp: 90 tablet, Rfl: 1   donepezil (ARICEPT) 10 MG tablet, Take half tablet (5 mg) daily for 2 weeks, then increase to the full tablet at 10 mg  daily, Disp: 30 tablet, Rfl: 11   folic acid (FOLVITE) 1 MG tablet, Take 1 tablet (1 mg total) by mouth daily., Disp: 30 tablet, Rfl: 3   hydrALAZINE (APRESOLINE) 50 MG tablet, Take 0.5 tablets (25 mg total) by mouth in the morning and at bedtime., Disp: 30 tablet, Rfl: 3   pantoprazole (PROTONIX) 20 MG tablet, Take 1 tablet (20 mg total) by mouth daily., Disp: 30 tablet, Rfl: 4   pravastatin (PRAVACHOL) 40 MG tablet, Take 1 tablet (40 mg total) by mouth daily., Disp: 30 tablet, Rfl: 4   verapamil (CALAN-SR) 240 MG CR tablet, Take 1 tablet (240 mg total) by mouth daily., Disp: 30 tablet, Rfl: 4  Clinical Interview:   The following information was obtained during a clinical interview with Jason Estrada and his wife prior to cognitive testing.  Cognitive Symptoms: Decreased short-term memory: Denied. However, his wife reported far more pronounced concerns. Examples were consistent with what is reported above and within his medical records, summarized by his neurologist. This will not be rehashed here. She noted that memory dysfunction had been ongoing for likely the past year or so and has progressively worsened over time. This worsening has seemed more accelerated during the past several months.  Decreased long-term memory: Denied. Decreased attention/concentration: Denied. Reduced processing speed: Denied. Difficulties with executive functions: Denied. He also denied trouble with impulsivity or any significant personality changes. His wife stated that he has had several emotional outbursts, seems to have a much shorter fuse, and is easily irritated. Records do suggest an instance around the recent Thanksgiving holiday where he became quite angry towards his grandchild when he/she asked for the television remote to change the channel.  Difficulties with emotion regulation: Denied. Difficulties with receptive language: Denied. Difficulties with word finding: Denied. Decreased visuoperceptual ability:  Denied.  Difficulties completing ADLs: Denied. However, his wife stated that she manages finances and bill paying responsibilities. He does manage his medications reasonably well and she regularly checks on him to make sure that this remains the case. While he continues to drive, his wife noted increasing navigational difficulties despite using a GPS to the extent she is concerned he will get confused and lost.   Additional Medical History: History of traumatic brain injury/concussion: Denied. History of stroke: Denied. History of seizure activity: Denied. History of known exposure to toxins: Denied. Symptoms of chronic pain: Denied. Experience of frequent headaches/migraines: Denied. Frequent instances of dizziness/vertigo: Denied.  Sensory changes: He wears glasses with benefit. Other sensory changes/difficulties (e.g., hearing, taste, or smell) were denied.  Balance/coordination difficulties: Denied. Recent falls were also denied. Other motor difficulties: Denied. His wife reported sporadic tremors in her right hand. She also noted that he will fidget or bounce while sitting, with movement including both his upper and lower extremities. He reported that this is longstanding in nature.   Sleep History: Estimated hours obtained each night: 7 hours.  Difficulties falling asleep: Denied. Difficulties staying asleep: Denied outside of occasionally awakening to use the restroom.  Feels rested and refreshed upon awakening: Endorsed.  History of snoring: Denied. History of waking up gasping for air: Denied. Witnessed breath cessation while asleep: Denied.  History of vivid dreaming: Denied. Excessive movement while asleep: Denied. Instances of acting out his dreams: Denied.  Psychiatric/Behavioral Health History: Depression: He described his current mood as "pretty good" and denied to his knowledge any prior mental health concerns or diagnoses. Current or remote suicidal ideation, intent,  or plan was denied.  Anxiety: Denied. Mania: Denied. Trauma History: Denied. Visual/auditory hallucinations: Denied. Delusional thoughts: Denied.  Tobacco: Denied. Alcohol: She reported consuming 1-2 mixed drinks per night and denied a history of problematic alcohol abuse or dependence.  Recreational drugs: Denied.  Family History: History reviewed. No pertinent family history. This information was confirmed by Jason Estrada.  Academic/Vocational History: Highest level of educational attainment: 16 years. He completed high school and earned a Bachelor's degree in zoology. He described himself as a good (A/B) student in academic settings. No relative weaknesses were identified.  History of developmental delay: Denied. History of grade repetition: Denied. Enrollment in special education courses: Denied. History of LD/ADHD: Denied.  Employment: Retired. 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   Evaluation Results:   Behavioral Observations: Jason Estrada was accompanied by his wife, arrived to his appointment on time, and was appropriately dressed and groomed. He appeared alert. He walked with a slight limp due to knee discomfort. Other than this, observed gait and station were within normal limits. Gross motor functioning appeared intact upon informal observation and no abnormal movements (e.g., tremors) were noted. His affect was generally relaxed and positive. Spontaneous speech was fluent and word finding difficulties were not observed during the clinical interview. Thought processes were coherent, organized, and normal in content. Insight into his cognitive difficulties appeared very limited in that he denied all cognitive concerns despite demonstrating quite severe memory impairment. During testing, sustained attention was appropriate. Task engagement was adequate and he persisted when challenged. He did require some instruction repetition and clarification across  more complex tasks (TMT B, D-KEFS Color Word). Overall, Jason Estrada was cooperative with the clinical interview and subsequent testing procedures.   Adequacy of Effort: The validity of neuropsychological testing is limited by the extent to which the individual being tested may be assumed to  have exerted adequate effort during testing. Jason Estrada expressed his intention to perform to the best of his abilities and exhibited adequate task engagement and persistence. Scores across stand-alone and embedded performance validity measures were within expectation. As such, the results of the current evaluation are believed to be a valid representation of Jason Estrada current cognitive functioning.  Test Results: Jason Estrada was poorly oriented at the time of the current evaluation. He was unable to state the current year, date, day of the week, name of the current clinic, or name of the city the current clinic was located in.   Intellectual abilities based upon educational and vocational attainment were estimated to be in the average range. Premorbid abilities were estimated to be within the well above average range based upon a single-word reading test.   Processing speed was below average to average. Basic attention was above average to exceptionally high. More complex attention (e.g., working memory) was below average to average. Executive functioning was variable, ranging from the exceptionally low to average normative ranges. He performed in the below average range on a task assessing safety and judgment.  Assessed receptive language abilities were average. Likewise, Jason Estrada did generally not exhibit any difficulties comprehending task instructions and answered all questions asked of him appropriately. Assessed expressive language was variable. Phonemic fluency was below average, semantic fluency was exceptionally low, and confrontation naming was average.      Assessed visuospatial/visuoconstructional  abilities were generally average to above average. He did perform in the well below average range on a line orientation task.    Learning (i.e., encoding) of novel verbal information was well below average. Spontaneous delayed recall (i.e., retrieval) of previously learned information was exceptionally low. Retention rates were 17% (raw score of 1) across a story learning task, 0% across a list learning task, and 0% across a figure drawing task. Performance across recognition tasks was exceptionally low across a list learning task and below average across two other tasks, suggesting limited evidence for information consolidation.   Results of emotional screening instruments suggested that recent symptoms of generalized anxiety were in the minimal range, while symptoms of depression were within normal limits. A screening instrument assessing recent sleep quality suggested the presence of minimal sleep dysfunction.  Tables of Scores:   Note: This summary of test scores accompanies the interpretive report and should not be considered in isolation without reference to the appropriate sections in the text. Descriptors are based on appropriate normative data and may be adjusted based on clinical judgment. Terms such as "Within Normal Limits" and "Outside Normal Limits" are used when a more specific description of the test score cannot be determined.       Percentile - Normative Descriptor > 98 - Exceptionally High 91-97 - Well Above Average 75-90 - Above Average 25-74 - Average 9-24 - Below Average 2-8 - Well Below Average < 2 - Exceptionally Low       Validity:   DESCRIPTOR       Dot Counting Test: --- --- Within Normal Limits  RBANS Effort Index: --- --- Within Normal Limits  WAIS-IV Reliable Digit Span: --- --- Within Normal Limits       Orientation:      Raw Score Percentile   NAB Orientation, Form 1 20/29 --- ---       Cognitive Screening:      Raw Score Percentile   SLUMS: 14/30 ---  ---       RBANS, Form A: Standard Score/  Scaled Score Percentile   Total Score 74 4 Well Below Average  Immediate Memory 69 2 Well Below Average    List Learning 4 2 Well Below Average    Story Memory 5 5 Well Below Average  Visuospatial/Constructional 92 30 Average    Figure Copy 12 75 Above Average    Line Orientation 12/20 3-9 Well Below Average  Language 64 4 Well Below Average    Picture Naming 10/10 51-75 Average    Semantic Fluency 1 <1 Exceptionally Low  Attention 109 73 Average    Digit Span 16 98 Exceptionally High    Coding 7 16 Below Average  Delayed Memory 52 <1 Exceptionally Low    List Recall 0/10 <2 Exceptionally Low    List Recognition 16/20 <2 Exceptionally Low    Story Recall 2 <1 Exceptionally Low    Story Recognition 9/12 16-26 Below Average    Figure Recall 1 <1 Exceptionally Low    Figure Recognition 4/8 9-20 Below Average        Intellectual Functioning:      Standard Score Percentile   Test of Premorbid Functioning: 595 63 Well Above Average       Attention/Executive Function:     Trail Making Test (TMT): Raw Score (Scaled Score) Percentile     Part A 44 secs.,  0 errors (9) 37 Average    Part B 156 secs.,  2 errors (8) 25 Average  *Based on [employer] Older Normative Studies (MOANS)           Scaled Score Percentile   WAIS-IV Digit Span: 11 63 Average    Forward 13 84 Above Average    Backward 6 9 Below Average    Sequencing 11 63 Average        Scaled Score Percentile   WAIS-IV Similarities: 9 37 Average       D-KEFS Color-Word Interference Test: Raw Score (Scaled Score) Percentile     Color Naming 44 secs. (6) 9 Below Average    Word Reading 27 secs. (9) 37 Average    Inhibition 169 secs. (1) <1 Exceptionally Low      Total Errors 17 errors (1) <1 Exceptionally Low    Inhibition/Switching Discontinued --- Impaired      Total Errors --- --- ---       NAB Executive Functions Module, Form 1: T Score Percentile     Judgment 40 16 Below  Average       Language:     Verbal Fluency Test: Raw Score (Scaled Score) Percentile     Phonemic Fluency (CFL) 21 (6) 9 Below Average    Category Fluency 15 (3) 1 Exceptionally Low  *Based on [employer] Older Normative Studies (MOANS)          NAB Language Module, Form 1: T Score Percentile     Auditory Comprehension 53 62 Average    Naming 29/31 (46) 34 Average       Visuospatial/Visuoconstruction:      Raw Score Percentile   Clock Drawing: 9/10 --- Within Normal Limits        Scaled Score Percentile   WAIS-IV Block Design: 13 84 Above Average       Mood and Personality:      Raw Score Percentile   Geriatric Depression Scale: 1 --- Within Normal Limits  Geriatric Anxiety Scale: 5 --- Minimal    Somatic 2 --- Minimal    Cognitive 1 --- Minimal    Affective 2 --- Minimal  Additional Questionnaires:      Raw Score Percentile   PROMIS Sleep Disturbance Questionnaire: 8 --- None to Slight   Informed Consent and Coding/Compliance:   The current evaluation represents a clinical evaluation for the purposes previously outlined by the referral source and is in no way reflective of a forensic evaluation.   Mr. Zingale was provided with a verbal description of the nature and purpose of the present neuropsychological evaluation. Also reviewed were the foreseeable risks and/or discomforts and benefits of the procedure, limits of confidentiality, and mandatory reporting requirements of this provider. The patient was given the opportunity to ask questions and receive answers about the evaluation. Oral consent to participate was provided by the patient.   This evaluation was conducted by Christia Reading, Ph.D., ABPP-CN, board certified clinical neuropsychologist. Mr. Whitby completed a clinical interview with Dr. Melvyn Novas, billed as one unit [phone], and 120 minutes of cognitive testing and scoring, billed as one unit [phone] and three additional units [id number]. Psychometrist Milana Kidney, B.S.,  assisted Dr. Melvyn Novas with test administration and scoring procedures. As a separate and discrete service, Dr. Melvyn Novas spent a total of 160 minutes in interpretation and report writing billed as one unit [phone] and two units [id number].

## 2021-10-03 NOTE — Progress Notes (Signed)
° °  Psychometrician Note   Cognitive testing was administered to Jason Estrada by Milana Kidney, B.S. (psychometrist) under the supervision of Dr. Christia Reading, Ph.D., licensed psychologist on 10/03/21. Jason Estrada did not appear overtly distressed by the testing session per behavioral observation or responses across self-report questionnaires. Rest breaks were offered.    The battery of tests administered was selected by Dr. Christia Reading, Ph.D. with consideration to Jason Estrada current level of functioning, the nature of his symptoms, emotional and behavioral responses during interview, level of literacy, observed level of motivation/effort, and the nature of the referral question. This battery was communicated to the psychometrist. Communication between Dr. Christia Reading, Ph.D. and the psychometrist was ongoing throughout the evaluation and Dr. Christia Reading, Ph.D. was immediately accessible at all times. Dr. Christia Reading, Ph.D. provided supervision to the psychometrist on the date of this service to the extent necessary to assure the quality of all services provided.    Jason Estrada will return within approximately 1-2 weeks for an interactive feedback session with Dr. Melvyn Novas at which time his test performances, clinical impressions, and treatment recommendations will be reviewed in detail. Jason Estrada understands he can contact our office should he require our assistance before this time.  A total of 120 minutes of billable time were spent face-to-face with Jason Estrada by the psychometrist. This includes both test administration and scoring time. Billing for these services is reflected in the clinical report generated by Dr. Christia Reading, Ph.D.  This note reflects time spent with the psychometrician and does not include test scores or any clinical interpretations made by Dr. Melvyn Novas. The full report will follow in a separate note.

## 2021-10-11 ENCOUNTER — Other Ambulatory Visit: Payer: Self-pay

## 2021-10-11 ENCOUNTER — Ambulatory Visit: Payer: Federal, State, Local not specified - PPO | Admitting: Psychology

## 2021-10-11 DIAGNOSIS — G309 Alzheimer's disease, unspecified: Secondary | ICD-10-CM | POA: Diagnosis not present

## 2021-10-11 DIAGNOSIS — F0280 Dementia in other diseases classified elsewhere without behavioral disturbance: Secondary | ICD-10-CM | POA: Diagnosis not present

## 2021-10-11 NOTE — Progress Notes (Signed)
° °  Neuropsychology Feedback Session [name]. [facility] of Neurology  Reason for Referral:   Jason Estrada is a 81 y.o. right-handed Caucasian male referred by  Sharene Butters, PA-C , to characterize his current cognitive functioning and assist with diagnostic clarity and treatment planning in the context of subjective cognitive decline and concern for an underlying neurodegenerative illness.   Feedback:   [name] completed a comprehensive neuropsychological evaluation on 10/03/2021. Please refer to that encounter for the full report and recommendations. Briefly, results suggested severe impairment across all aspects of learning and memory. Additional impairment was exhibited across semantic fluency and an isolated line orientation task, while performance variability was exhibited across executive functioning. Regarding etiology, I have notable concerns surrounding the presence of Alzheimer's disease and feel that this represents the most likely culprit for ongoing cognitive decline. [name] did not benefit from repeated exposure to information across learning trials, was essentially amnestic across all memory tasks after a short delay, and generally performed below expectation across yes/no recognition tasks. All of which suggests the presence of rapid forgetting and an evolving and already fairly significant memory storage deficit, both of which are hallmark characteristics of Alzheimer's disease. Further impairment in semantic fluency is consistent with this disease process and its normal progression. His wife's reporting of him rapidly forgetting information and repeating himself often also aligns well with Alzheimer's disease.  [name] was accompanied by his wife and son during the current telephone call. They were within his residence while I was within my office. I discussed the limitations of evaluation and management by telemedicine and the availability of  in person appointments. Mr. On expressed his understanding and agreed to proceed. Content of the current session focused on the results of his neuropsychological evaluation. [name] was given the opportunity to ask questions and his questions were answered. He was encouraged to reach out should additional questions arise. His report was accessed by he and his family prior to our feedback appointment on [place]. I will mail an informational pamphlet about Alzheimer's disease to him as requested by his wife. They were also encouraged to seek out community resources via the [place] ([email or web address]) and the [place] chapter of the Alzheimer's Association ([email or web address]).     39 minutes were spent conducting the current feedback session with [name], billed as one unit [phone].

## 2021-10-18 ENCOUNTER — Other Ambulatory Visit: Payer: Federal, State, Local not specified - PPO | Admitting: Urology

## 2021-10-22 ENCOUNTER — Encounter: Payer: Self-pay | Admitting: Internal Medicine

## 2021-10-22 ENCOUNTER — Ambulatory Visit: Payer: Federal, State, Local not specified - PPO | Admitting: Internal Medicine

## 2021-10-22 ENCOUNTER — Other Ambulatory Visit: Payer: Self-pay

## 2021-10-22 DIAGNOSIS — R972 Elevated prostate specific antigen [PSA]: Secondary | ICD-10-CM | POA: Diagnosis not present

## 2021-10-22 DIAGNOSIS — C61 Malignant neoplasm of prostate: Secondary | ICD-10-CM | POA: Insufficient documentation

## 2021-10-22 DIAGNOSIS — F0280 Dementia in other diseases classified elsewhere without behavioral disturbance: Secondary | ICD-10-CM

## 2021-10-22 DIAGNOSIS — I10 Essential (primary) hypertension: Secondary | ICD-10-CM | POA: Diagnosis not present

## 2021-10-22 DIAGNOSIS — G309 Alzheimer's disease, unspecified: Secondary | ICD-10-CM | POA: Diagnosis not present

## 2021-10-22 HISTORY — DX: Malignant neoplasm of prostate: C61

## 2021-10-22 NOTE — Progress Notes (Signed)
There were no vitals taken for this visit.   Subjective:    Patient ID: Jason Estrada, male    DOB: 1940/11/14, 81 y.o.   MRN: [id number]  Chief Complaint  Patient presents with   Hypertension   Hyperlipidemia   Dementia   Tachycardia    HPI: Jason Estrada is a 81 y.o. male  Hypertension This is a chronic problem. Pertinent negatives include no chest pain, headaches, palpitations or shortness of breath.  Hyperlipidemia This is a chronic problem. The problem is controlled. Pertinent negatives include no chest pain, myalgias or shortness of breath.   Chief Complaint  Patient presents with   Hypertension   Hyperlipidemia   Dementia   Tachycardia    Relevant past medical, surgical, family and social history reviewed and updated as indicated. Interim medical history since our last visit reviewed. Allergies and medications reviewed and updated.  Review of Systems  Constitutional:  Negative for activity change, appetite change, chills, fatigue and fever.  HENT:  Negative for congestion, ear discharge, ear pain and facial swelling.   Eyes:  Negative for pain, discharge and itching.  Respiratory:  Negative for cough, chest tightness, shortness of breath and wheezing.   Cardiovascular:  Negative for chest pain, palpitations and leg swelling.  Gastrointestinal:  Negative for abdominal distention, abdominal pain, blood in stool, constipation, diarrhea, nausea and vomiting.  Endocrine: Negative for cold intolerance, heat intolerance, polydipsia, polyphagia and polyuria.  Genitourinary:  Negative for difficulty urinating, dysuria, flank pain, frequency, hematuria and urgency.  Musculoskeletal:  Negative for arthralgias, gait problem, joint swelling and myalgias.  Skin:  Negative for color change, rash and wound.  Neurological:  Negative for dizziness, tremors, speech difficulty, weakness, light-headedness, numbness and headaches.  Hematological:  Does not bruise/bleed easily.   Psychiatric/Behavioral:  Negative for agitation, confusion, decreased concentration, sleep disturbance and suicidal ideas.    Per HPI unless specifically indicated above     Objective:    There were no vitals taken for this visit.  Wt Readings from Last 3 Encounters:  09/28/21 199 lb 6.4 oz (90.4 kg)  09/25/21 198 lb (89.8 kg)  08/27/21 198 lb 12.8 oz (90.2 kg)    Physical Exam Vitals and nursing note reviewed.  Constitutional:      General: He is not in acute distress.    Appearance: Normal appearance. He is not ill-appearing or diaphoretic.  HENT:     Head: Normocephalic and atraumatic.     Right Ear: Tympanic membrane and external ear normal. There is no impacted cerumen.     Left Ear: External ear normal.     Nose: No congestion or rhinorrhea.     Mouth/Throat:     Pharynx: No oropharyngeal exudate or posterior oropharyngeal erythema.  Eyes:     Conjunctiva/sclera: Conjunctivae normal.     Pupils: Pupils are equal, round, and reactive to light.  Cardiovascular:     Rate and Rhythm: Normal rate and regular rhythm.     Heart sounds: No murmur heard.   No friction rub. No gallop.  Pulmonary:     Effort: No respiratory distress.     Breath sounds: No stridor. No wheezing or rhonchi.  Chest:     Chest wall: No tenderness.  Abdominal:     General: Abdomen is flat. Bowel sounds are normal.     Palpations: Abdomen is soft. There is no mass.     Tenderness: There is no abdominal tenderness.  Musculoskeletal:     Cervical back: Normal range  of motion and neck supple. No rigidity or tenderness.     Left lower leg: No edema.  Skin:    General: Skin is warm and dry.  Neurological:     Mental Status: He is alert.    Results for orders placed or performed during the hospital encounter of 09/04/21  PSA (Reflex To Free) (Serial)  Result Value Ref Range   Prostate Specific Ag, Serum 47.5 (H) 0.0 - 4.0 ng/mL   Reflex Criteria Comment         Current Outpatient  Medications:    allopurinol (ZYLOPRIM) 100 MG tablet, Take 1 tablet (100 mg total) by mouth daily., Disp: 30 tablet, Rfl: 6   baclofen (LIORESAL) 10 MG tablet, TAKE 1 TABLET BY MOUTH EVERY DAY AS NEEDED, Disp: 30 tablet, Rfl: 0   calcitRIOL (ROCALTROL) 0.25 MCG capsule, Take 1 capsule (0.25 mcg total) by mouth daily., Disp: 30 capsule, Rfl: 4   colchicine 0.6 MG tablet, TAKE 1 TABLET BY MOUTH EVERY DAY, Disp: 90 tablet, Rfl: 1   donepezil (ARICEPT) 10 MG tablet, Take half tablet (5 mg) daily for 2 weeks, then increase to the full tablet at 10 mg daily, Disp: 30 tablet, Rfl: 11   folic acid (FOLVITE) 1 MG tablet, Take 1 tablet (1 mg total) by mouth daily., Disp: 30 tablet, Rfl: 3   hydrALAZINE (APRESOLINE) 50 MG tablet, Take 0.5 tablets (25 mg total) by mouth in the morning and at bedtime., Disp: 30 tablet, Rfl: 3   pantoprazole (PROTONIX) 20 MG tablet, Take 1 tablet (20 mg total) by mouth daily., Disp: 30 tablet, Rfl: 4   pravastatin (PRAVACHOL) 40 MG tablet, Take 1 tablet (40 mg total) by mouth daily., Disp: 30 tablet, Rfl: 4   verapamil (CALAN-SR) 240 MG CR tablet, Take 1 tablet (240 mg total) by mouth daily., Disp: 30 tablet, Rfl: 4    Assessment & Plan:  Dementia :  Neuropsychology and neurology eval - MRI  severe impairment across all aspects of learning and memory Continue aricept for such ? Namenda fu and mx per neurol Mildly motion degraded exam. Is more forgetfulness -  To talk to family about advance directives. To see neurology and ? Needs additional medications.    No evidence of acute intracranial abnormality.  Moderate chronic small vessel ischemic changes within the cerebral white matter.  Mild to moderate generalized cerebral atrophy. Comparatively mild cerebellar atrophy.  Trace mucosal thickening within the left maxillary and bilateral ethmoid sinuses. Superimposed small left maxillary sinus mucous retention cyst.  Incidentally noted nonspecific (possibly cystic) T2  hyperintense lesion within the right suboccipital scalp, measuring 1.8 x 0.6 cm in transaxial dimensions. A targeted ultrasound may be helpful for further characterization.   Prostate cancer? To have a biopsy for such will need to fu with Dr. Tish Men for such. PSA didn't come back down and-- 57 -- 41 --- 33 -- 47.5      Problem List Items Addressed This Visit       Cardiovascular and Mediastinum   Essential hypertension     Nervous and Auditory   Dementia due to Alzheimer's disease     Other   High prostate specific antigen (PSA) - Primary     No orders of the defined types were placed in this encounter.    No orders of the defined types were placed in this encounter.    Follow up plan: Return in about 6 weeks (around 12/03/2021).

## 2021-10-24 ENCOUNTER — Other Ambulatory Visit: Payer: Self-pay | Admitting: Internal Medicine

## 2021-10-24 NOTE — Telephone Encounter (Signed)
Pt's wife called to report that the patient also needs Folic acid, she says she was told today that they will not fill out without PCP approval.

## 2021-10-24 NOTE — Telephone Encounter (Signed)
Requested medication (s) are due for refill today:   Provider to review  Requested medication (s) are on the active medication list:   Yes  Future visit scheduled:   Yes   Last ordered: 10/03/2021 #30, 0 refills  Returned because protocol criteria not met.   Labs out of range.   Non delegated refill   Requested Prescriptions  Pending Prescriptions Disp Refills   baclofen (LIORESAL) 10 MG tablet [Pharmacy Med Name: BACLOFEN 10 MG TABLET] 30 tablet 0    Sig: TAKE 1 TABLET BY MOUTH EVERY DAY AS NEEDED     Analgesics:  Muscle Relaxants - baclofen Failed - 10/24/2021 11:40 AM      Failed - Cr in normal range and within 180 days    Creatinine, Ser  Date Value Ref Range Status  08/06/2021 2.06 (H) 0.76 - 1.27 mg/dL Final          Passed - eGFR is 30 or above and within 180 days    GFR, Estimated  Date Value Ref Range Status  04/23/2021 24 (L) >60 mL/min Final    Comment:    (NOTE) Calculated using the CKD-EPI Creatinine Equation (2021)    eGFR  Date Value Ref Range Status  08/06/2021 32 (L) >59 mL/min/1.73 Final          Passed - Valid encounter within last 6 months    Recent Outpatient Visits           2 days ago High prostate specific antigen (PSA)   [facility] [name], [name], MD   1 month ago Need for influenza vaccination   [place] [name], [name], MD   2 months ago Tachycardia   [facility] [name], [name], MD   4 months ago Gout, unspecified cause, unspecified chronicity, unspecified site   [facility] [name], [name], MD   6 months ago 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

## 2021-10-24 NOTE — Telephone Encounter (Signed)
Has newer rx at different pharm Requested Prescriptions  Pending Prescriptions Disp Refills   folic acid (FOLVITE) 1 MG tablet [Pharmacy Med Name: FOLIC ACID 1 MG TABLET] 30 tablet 3    Sig: TAKE 1 TABLET BY MOUTH EVERY DAY     Endocrinology:  Vitamins Passed - 10/24/2021  2:49 PM      Passed - Valid encounter within last 12 months    Recent Outpatient Visits          2 days ago High prostate specific antigen (PSA)   [facility] [name], [name], MD   1 month ago Need for influenza vaccination   [facility] [name], [name], MD   2 months ago Tachycardia   [facility] [name], [name], MD   4 months ago Gout, unspecified cause, unspecified chronicity, unspecified site   [facility] [name], [name], MD   6 months ago 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

## 2021-10-25 ENCOUNTER — Ambulatory Visit: Payer: Federal, State, Local not specified - PPO | Admitting: Urology

## 2021-10-25 ENCOUNTER — Other Ambulatory Visit: Payer: Self-pay

## 2021-10-25 ENCOUNTER — Encounter: Payer: Self-pay | Admitting: Urology

## 2021-10-25 VITALS — BP 130/72 | HR 73 | Ht 72.0 in | Wt 189.0 lb

## 2021-10-25 DIAGNOSIS — C61 Malignant neoplasm of prostate: Secondary | ICD-10-CM | POA: Diagnosis not present

## 2021-10-25 DIAGNOSIS — R972 Elevated prostate specific antigen [PSA]: Secondary | ICD-10-CM

## 2021-10-25 MED ORDER — LEVOFLOXACIN 500 MG PO TABS
500.0000 mg | ORAL_TABLET | Freq: Once | ORAL | Status: AC
  Administered 2021-10-25: 500 mg via ORAL

## 2021-10-25 MED ORDER — GENTAMICIN SULFATE 40 MG/ML IJ SOLN
80.0000 mg | Freq: Once | INTRAMUSCULAR | Status: AC
  Administered 2021-10-25: 80 mg via INTRAMUSCULAR

## 2021-10-25 NOTE — Patient Instructions (Signed)

## 2021-10-25 NOTE — Progress Notes (Signed)
° °  10/25/21  Indication: Elevated PSA 47.5, prostate MRI showing PI-RADS 5 lesion at the right base  Prostate Biopsy Procedure   Informed consent was obtained, and we discussed the risks of bleeding and infection/sepsis. A time out was performed to ensure correct patient identity.  Pre-Procedure: - Last PSA Level: 47.5 - Gentamicin and levaquin given for antibiotic prophylaxis - Transrectal Ultrasound performed revealing a 45 gm prostate -Hypoechoic region at right lateral base corresponding with MRI  Procedure: - Prostate block performed using 10 cc 1% lidocaine and biopsies taken from sextant areas, a total of 12 under ultrasound guidance, additional biopsy taken at area of interest at right lateral base hypoechoic region  Post-Procedure: - Patient tolerated the procedure well - He was counseled to seek immediate medical attention if experiences significant bleeding, fevers, or severe pain - Return in one week to discuss biopsy results  Assessment/ Plan: Will follow up in 1-2 weeks to discuss pathology  Nickolas Madrid, MD 10/25/2021

## 2021-10-29 ENCOUNTER — Other Ambulatory Visit: Payer: Self-pay | Admitting: Internal Medicine

## 2021-10-29 LAB — SURGICAL PATHOLOGY

## 2021-10-29 NOTE — Telephone Encounter (Signed)
Sending to different pharmacy, wife request.  Requested Prescriptions  Pending Prescriptions Disp Refills   folic acid (FOLVITE) 1 MG tablet [Pharmacy Med Name: FOLIC ACID 1 MG TABLET] 30 tablet 1    Sig: TAKE 1 TABLET BY MOUTH EVERY DAY     Endocrinology:  Vitamins Passed - 10/29/2021 10:45 AM      Passed - Valid encounter within last 12 months    Recent Outpatient Visits          1 week ago High prostate specific antigen (PSA)   [facility] [name], [name], MD   2 months ago Need for influenza vaccination   [facility] [name], [name], MD   2 months ago Tachycardia   [facility] [name], [name], MD   4 months ago Gout, unspecified cause, unspecified chronicity, unspecified site   [facility] [name], [name], MD   6 months ago 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

## 2021-10-29 NOTE — Telephone Encounter (Signed)
Wife states she has been trying for over a week to get the folic acid (FOLVITE) 1 MG tablet Sent to [id number] - [place], [place] - [address]  Wife states she has never used that pharmacy, never picked this Rx up, and she would like to have this transferred. She is a little frustrated and I told her I could help with this.  Pt is out of medication and needs today.

## 2021-11-01 ENCOUNTER — Encounter: Payer: Self-pay | Admitting: Urology

## 2021-11-01 ENCOUNTER — Other Ambulatory Visit: Payer: Self-pay

## 2021-11-01 ENCOUNTER — Telehealth: Payer: Self-pay | Admitting: Urology

## 2021-11-01 ENCOUNTER — Ambulatory Visit: Payer: Federal, State, Local not specified - PPO | Admitting: Urology

## 2021-11-01 VITALS — BP 116/58 | HR 87 | Ht 72.0 in | Wt 189.0 lb

## 2021-11-01 DIAGNOSIS — C61 Malignant neoplasm of prostate: Secondary | ICD-10-CM | POA: Diagnosis not present

## 2021-11-01 DIAGNOSIS — N184 Chronic kidney disease, stage 4 (severe): Secondary | ICD-10-CM

## 2021-11-01 NOTE — Patient Instructions (Signed)
Prostate Cancer The prostate is a small gland that produces fluid that makes up semen (seminal fluid). It is located below the bladder in men, in front of the rectum. Prostate cancer is the abnormal growth of cells in the prostate gland. What are the causes? The exact cause of this condition is not known. What increases the risk? You are more likely to develop this condition if: You are 79 years of age or older. You have a family history of prostate cancer. You have a family history of breast and ovarian cancer. You have genes that are passed from parent to child (inherited), such as BRCA1 and BRCA2. You have Lynch syndrome. African American men and men of African descent are diagnosed with prostate cancer at higher rates than other men. The reasons for this are not well understood and are likely due to a combination of genetic and environmental factors. What are the signs or symptoms? Symptoms of this condition include: Problems with urination. This may include: A weak or interrupted flow of urine. Trouble starting or stopping urination. Trouble emptying the bladder all the way. The need to urinate more often, especially at night. Blood in urine or semen. Persistent pain or discomfort in the lower back, lower abdomen, or hips. Trouble getting an erection. Weakness or numbness in the legs or feet. How is this diagnosed? This condition can be diagnosed with: A digital rectal exam. For this exam, a health care provider inserts a gloved finger into the rectum to feel the prostate gland. A blood test called a prostate-specific antigen (PSA) test. A procedure in which a sample of tissue is taken from the prostate and checked under a microscope (prostate biopsy). An imaging test called transrectal ultrasonography. Once the condition is diagnosed, tests will be done to determine how far the cancer has spread. This is called staging the cancer. Staging may involve imaging tests, such as a bone  scan, CT scan, PET scan, or MRI. Stages of prostate cancer The stages of prostate cancer are as follows: Stage 1 (I). At this stage, the cancer is found in the prostate only. The cancer is not visible on imaging tests, and it is usually found by accident, such as during prostate surgery. Stage 2 (II). At this stage, the cancer is more advanced than it is in stage 1, but the cancer has not spread outside the prostate. Stage 3 (III). At this stage, the cancer has spread beyond the outer layer of the prostate to nearby tissues. The cancer may be found in the seminal vesicles, which are near the bladder and the prostate. Stage 4 (IV). At this stage, the cancer has spread to other parts of the body, such as the lymph nodes, bones, bladder, rectum, liver, or lungs. Prostate cancer grading Prostate cancer is also graded according to how the cancer cells look under a microscope. This is called the Gleason score and the total score can range from 6-10, indicating how likely it is that the cancer will spread (metastasize) to other parts of the body. The higher the score, the greater the likelihood that the cancer will spread. Gleason 6 or lower: This indicates that the cancer cells look similar to normal prostate cells (well differentiated). Gleason 7: This indicates that the cancer cells look somewhat similar to normal prostate cells (moderately differentiated). Gleason 8, 9, or 10: This indicates that the cancer cells look very different than normal prostate cells (poorly differentiated). How is this treated? Treatment for this condition depends on several factors,  including the stage of the cancer, your age, personal preferences, and your overall health. Talk with your health care provider about treatment options that are recommended for you. Common treatments include: °Observation for early stage prostate cancer (active surveillance). This involves having exams, blood tests, and in some cases, more biopsies.  For some men, this is the only treatment needed. °Surgery. Types of surgeries include: °Open surgery (radical prostatectomy). In this surgery, a larger incision is made to remove the prostate. °A laparoscopic radical prostatectomy. This is a surgery to remove the prostate and lymph nodes through several small incisions. It is often referred to as a minimally invasive surgery. °A robotic radical prostatectomy. This is laparoscopic surgery to remove the prostate and lymph nodes with the help of robotic arms that are controlled by the surgeon. °Cryoablation. This is surgery to freeze and destroy cancer cells. °Radiation treatment. Types of radiation treatment include: °External beam radiation. This type aims beams of radiation from outside the body at the prostate to destroy cancerous cells. °Brachytherapy. This type uses radioactive needles, seeds, wires, or tubes that are implanted into the prostate gland. Like external beam radiation, brachytherapy destroys cancerous cells. An advantage is that this type of radiation limits the damage to surrounding tissue and has fewer side effects. °Chemotherapy. This treatment kills cancer cells or stops them from multiplying. It kills both cancer cells and normal cells. °Targeted therapy. This treatment uses medicines to kill cancer cells without damaging normal cells. °Hormone treatment. This treatment involves taking medicines that act on testosterone, one of the male hormones, by: °Stopping your body from producing testosterone. °Blocking testosterone from reaching cancer cells. °Follow these instructions at home: °Lifestyle °Do not use any products that contain nicotine or tobacco. These products include cigarettes, chewing tobacco, and vaping devices, such as e-cigarettes. If you need help quitting, ask your health care provider. °Eat a healthy diet. To do this: °Eat foods that are high in fiber. These include beans, whole grains, and fresh fruits and vegetables. °Limit  foods that are high in fat and sugar. These include fried or sweet foods. °Treatment for prostate cancer may affect sexual function. If you have a partner, continue to have intimate moments. This may include touching, holding, hugging, and caressing your partner. °Get plenty of sleep. °Consider joining a support group for men who have prostate cancer. Meeting with a support group may help you learn to manage the stress of having cancer. °General instructions °Take over-the-counter and prescription medicines only as told by your health care provider. °If you have to go to the hospital, notify your cancer specialist (oncologist). °Keep all follow-up visits. This is important. °Where to find more information °American Cancer Society: www.cancer.org °American Society of Clinical Oncology: www.cancer.net °National Cancer Institute: www.cancer.gov °Contact a health care provider if: °You have new or increasing trouble urinating. °You have new or increasing blood in your urine. °You have new or increasing pain in your hips, back, or chest. °Get help right away if: °You have weakness or numbness in your legs. °You cannot control urination or your bowel movements (incontinence). °You have chills or a fever. °Summary °The prostate is a small gland that is involved in the production of semen. It is located below a man's bladder, in front of the rectum. °Prostate cancer is the abnormal growth of cells in the prostate gland. °Treatment for this condition depends on the stage of the cancer, your age, personal preferences, and your overall health. Talk with your health care provider about treatment   options that are recommended for you. °Consider joining a support group for men who have prostate cancer. Meeting with a support group may help you learn to manage the stress of having cancer. °This information is not intended to replace advice given to you by your health care provider. Make sure you discuss any questions you have with  your health care provider. °Document Revised: 11/29/2020 Document Reviewed: 11/29/2020 °Elsevier Patient Education © 2022 [employer] Inc. ° °

## 2021-11-01 NOTE — Telephone Encounter (Signed)
Left message for the patient to call back. He needs a PET scan and needs to have it done at [facility] as the [facility] dept here  [facility] is closed until April. The number to [facility] is [phone] He needs to call them to schedule.  Thanks, Sharyn Lull

## 2021-11-01 NOTE — Progress Notes (Signed)
° °  11/01/2021 12:53 PM   Jason Estrada [birth year] [id number]  Reason for visit: Discuss prostate biopsy results  HPI: I saw Jason Estrada and his wife back in clinic today to review his prostate biopsy results.  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  This showed a 38 g prostate with a PI-RADS 5 lesion in the right anterior mid gland tracking towards the base with suspected early extracapsular disease along the right anterior lateral margin of the prostate, as well as a PI-RADS 4 lesion at the left prostate at the margin of the transition zone.  There was no lymphadenopathy or bony lesion in the pelvis.  He underwent a cognitive MRI biopsy that showed intermediate risk disease with Gleason score 3+4=7 disease.  We had a lengthy conversation today about the patient's new diagnosis of prostate cancer.  We reviewed the risk classifications per the AUA guidelines including very low risk, low risk, intermediate risk, and high risk disease, and the need for additional staging imaging with CT and bone scan in patients with unfavorable intermediate risk and high risk disease.  I explained that his life expectancy, clinical stage, Gleason score, PSA, and other co-morbidities influence treatment strategies.  We discussed the roles of active surveillance, radiation therapy, surgical therapy with robotic prostatectomy, and hormone therapy with androgen deprivation.  We discussed that patients urinary symptoms also impact treatment strategy, as patients with severe lower urinary tract symptoms may have significant worsening or even develop urinary retention after undergoing radiation.  With his age and comorbidities he would not be a surgical candidate.  With his significantly elevated PSA of 50 he falls into the high risk category.  We discussed the challenges of  managing prostate cancer in older patients with other comorbidities, and the challenges of predicting the natural progression of prostate cancer.  I recommended starting with a PSMA PET scan to evaluate for extent and evidence of metastatic disease, and referral to radiation oncology for discussion of options.  He may even be a candidate for watchful waiting if they are willing to accept those risks with his comorbidities, otherwise if they desire treatment he may be a candidate for ADT and external beam radiation.  -PSMA PET scan ordered, call with results -Referral placed to radiation oncology -Watchful waiting versus intervention with radiation and ADT both reasonable options with his comorbidities, will rediscuss pending above findings  I spent 35 total minutes on the day of the encounter including pre-visit review of the medical record, face-to-face time with the patient, and post visit ordering of labs/imaging/tests.   Billey Co, [facility] [address], [place],  [address] [phone]

## 2021-11-07 ENCOUNTER — Other Ambulatory Visit: Payer: Self-pay

## 2021-11-07 ENCOUNTER — Ambulatory Visit: Payer: Federal, State, Local not specified - PPO | Admitting: Radiation Oncology

## 2021-11-07 ENCOUNTER — Ambulatory Visit
Admission: RE | Admit: 2021-11-07 | Discharge: 2021-11-07 | Disposition: A | Discharge status: Home / Self Care | Payer: Federal, State, Local not specified - PPO | Source: Ambulatory Visit | Attending: Radiation Oncology | Admitting: Radiation Oncology

## 2021-11-07 ENCOUNTER — Encounter: Payer: Self-pay | Admitting: Radiation Oncology

## 2021-11-07 VITALS — BP 157/79 | HR 67 | Temp 97.8°F | Resp 16 | Wt 195.6 lb

## 2021-11-07 DIAGNOSIS — C61 Malignant neoplasm of prostate: Secondary | ICD-10-CM | POA: Insufficient documentation

## 2021-11-07 DIAGNOSIS — E785 Hyperlipidemia, unspecified: Secondary | ICD-10-CM | POA: Diagnosis not present

## 2021-11-07 DIAGNOSIS — I129 Hypertensive chronic kidney disease with stage 1 through stage 4 chronic kidney disease, or unspecified chronic kidney disease: Secondary | ICD-10-CM | POA: Diagnosis not present

## 2021-11-07 DIAGNOSIS — F0280 Dementia in other diseases classified elsewhere without behavioral disturbance: Secondary | ICD-10-CM | POA: Diagnosis not present

## 2021-11-07 DIAGNOSIS — Z79899 Other long term (current) drug therapy: Secondary | ICD-10-CM | POA: Insufficient documentation

## 2021-11-07 DIAGNOSIS — G309 Alzheimer's disease, unspecified: Secondary | ICD-10-CM | POA: Insufficient documentation

## 2021-11-07 DIAGNOSIS — N184 Chronic kidney disease, stage 4 (severe): Secondary | ICD-10-CM | POA: Diagnosis not present

## 2021-11-07 DIAGNOSIS — Z923 Personal history of irradiation: Secondary | ICD-10-CM | POA: Diagnosis not present

## 2021-11-07 NOTE — Consult Note (Signed)
NEW PATIENT EVALUATION  Name: Jason Estrada  MRN: [id number]  Date:   11/07/2021     DOB: September 20, 1940   This 81 y.o. 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  REFERRING PHYSICIAN: [name], [name], MD  CHIEF COMPLAINT:  Chief Complaint  Patient presents with   Prostate Cancer    Initial consultation    DIAGNOSIS: The encounter diagnosis was Prostate cancer ([place]).   PREVIOUS INVESTIGATIONS:  MRI scan reviewed PET CT has been ordered Clinical notes reviewed Pathology report reviewed  HPI: Patient is a 81 year old male followed by Dr. Liberty Handy [facility] u[facility].  He presented with a PSA in the 50 range.  He underwent an MRI scan of his prostate showing a PI-RADS category 5 lesion in the right anterior mid gland tracking towards the base suspicious for carcinoma with suspected early extracapsular extension.  There were no signs of pelvic lymphadenopathy or bony lesions in the pelvis.  He underwent MRI guided biopsy showing 4 of 12 cores positive for Gleason 7 (3+4) adenocarcinoma with acinar features.  He has nocturia x1 no problems with his bowels and no bone pain.  He has had a PSA PET scan ordered although that has not been assigned a time and he must seek that out in [place].  Patient is has multiple comorbidities including early dementia stage IV CKD with a creatinine of 2.4.  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    PAST SURGICAL HISTORY:  Past Surgical History:  Procedure Laterality Date   CHOLECYSTECTOMY      FAMILY HISTORY: family history is not on file.  SOCIAL HISTORY:  reports that he has never smoked. He has never been exposed to tobacco smoke. He has never used smokeless tobacco. He reports that he does not currently use alcohol after a past usage of about 7.0 standard drinks per week. He reports that he does not currently use drugs.  ALLERGIES: Patient has no known allergies.  MEDICATIONS:  Current Outpatient Medications  Medication Sig Dispense Refill   allopurinol (ZYLOPRIM) 100 MG tablet Take 1 tablet (100 mg total) by mouth daily. 30 tablet 6   baclofen (LIORESAL) 10 MG tablet TAKE 1 TABLET BY MOUTH EVERY DAY AS NEEDED 30 tablet 0   calcitRIOL (ROCALTROL) 0.25 MCG capsule Take 1 capsule (0.25 mcg total) by mouth daily. 30 capsule 4   colchicine 0.6 MG tablet TAKE 1 TABLET BY MOUTH EVERY DAY 90 tablet 1   donepezil (ARICEPT) 10 MG tablet Take half tablet (5 mg) daily for 2 weeks, then increase to the full tablet at 10 mg daily 30 tablet 11   folic acid (FOLVITE) 1 MG tablet TAKE 1 TABLET BY MOUTH EVERY DAY 30 tablet 1   hydrALAZINE (APRESOLINE) 50 MG tablet Take 0.5 tablets (25 mg total) by mouth in the morning and at bedtime. 30 tablet 3   pantoprazole (PROTONIX) 20 MG tablet Take  1 tablet (20 mg total) by mouth daily. 30 tablet 4   pravastatin (PRAVACHOL) 40 MG tablet Take 1 tablet (40 mg total) by mouth daily. 30 tablet 4   verapamil (CALAN-SR) 240 MG CR tablet Take 1 tablet (240 mg total) by mouth daily. 30 tablet 4   No current facility-administered medications for this encounter.    ECOG PERFORMANCE STATUS:  0 - Asymptomatic  REVIEW OF SYSTEMS: Patient denies any  weight loss, fatigue, weakness, fever, chills or night sweats. Patient denies any loss of vision, blurred vision. Patient denies any ringing  of the ears or hearing loss. No irregular heartbeat. Patient denies heart murmur or history of fainting. Patient denies any chest pain or pain radiating to her upper extremities. Patient denies any shortness of breath, difficulty breathing at night, cough or hemoptysis. Patient denies any swelling in the lower legs. Patient denies any nausea vomiting, vomiting of blood, or coffee ground material in the vomitus. Patient denies any stomach pain. Patient states has had normal bowel movements no significant constipation or diarrhea. Patient denies any dysuria, hematuria or significant nocturia. Patient denies any problems walking, swelling in the joints or loss of balance. Patient denies any skin changes, loss of hair or loss of weight. Patient denies any excessive worrying or anxiety or significant depression. Patient denies any problems with insomnia. Patient denies excessive thirst, polyuria, polydipsia. Patient denies any swollen glands, patient denies easy bruising or easy bleeding. Patient denies any recent infections, allergies or URI. Patient "s visual fields have not changed significantly in recent time.   PHYSICAL EXAM: BP (!) 157/79 (BP Location: Left Arm, Patient Position: Sitting)    Pulse 67    Temp 97.8 F (36.6 C) (Tympanic)    Resp 16    Wt 195 lb 9.6 oz (88.7 kg)    BMI 26.53 kg/m  Well-developed well-nourished patient in NAD. HEENT reveals PERLA, EOMI, discs not visualized.  Oral cavity is clear. No oral mucosal lesions are identified. Neck is clear without evidence of cervical or supraclavicular adenopathy. Lungs are clear to A&P. Cardiac examination is essentially unremarkable with regular rate and rhythm without murmur rub or thrill. Abdomen is benign with no organomegaly or masses noted. Motor sensory and DTR levels are equal and symmetric in the upper  and lower extremities. Cranial nerves II through XII are grossly intact. Proprioception is intact. No peripheral adenopathy or edema is identified. No motor or sensory levels are noted. Crude visual fields are within normal range.  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  Should this show no evidence of stage IV disease would offer IMRT radiation therapy to his prostate and pelvic nodes.  I performed the [facility] nomogram showing a 41% chance of lymph node involvement.  Risks and benefits of treatment including increased lower urinary tract symptoms diarrhea fatigue alteration of blood counts and skin reaction all reviewed with the patient and his wife.  I have set him up for follow-up after his PET to discuss those results.  We will move forward with simulation should there be no evidence of distant disease.  Patient and his wife comprehend my treatment plan well.  I would like to take  this opportunity to thank you for allowing me to participate in the care of your patient.Noreene Filbert, MD

## 2021-11-12 NOTE — Progress Notes (Signed)
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  Discussed the importance of regular daily schedule to maintain brain function. Wife to look into [facility] adult day program  Continue to monitor mood by PCP Stay active at least 30 minutes at least 3 times a week.  Naps should be scheduled and should be no longer than 60 minutes and should not occur after 2 PM.  Highly recommend discontinuing driving for safety reasons Mediterranean diet is recommended  Control cardiovascular risk factors  Continue donepezil 10 mg daily Side effects were discussed Follow up in  6 months.   Case discussed with Dr. Delice Lesch who agrees with the plan     Subjective:    Jason Estrada is a very pleasant 81 y.o. RH male  seen today in follow up for memory loss. This patient is accompanied in the office by his  wife who supplements the history.  Previous records as well as any outside records available were reviewed prior to todays visit.  Patient was last seen at our office on  at which time his     Jason Estrada is a 81 y.o.  RH male hypertension, hyperlipidemia, stage IIIa CKD, enlarged prostate, seen today for evaluation of memory loss.  He was first seen at our office on 06/04/2021, at which time mild to moderate dementia was suspected.  MoCA at the time was 17/30.  He was started on donepezil 10 mg daily, tolerating well.  MRI of the brain on 06/20/2021 was remarkable for mild to moderate generalized cerebral atrophy and mild cerebellar atrophy, with moderate chronic small vessel ischemic changes.   Recent neurocognitive evaluation yielded a diagnosis of Dementia due to Alzheimer's disease . Previous records as well as any outside records available were reviewed prior to todays visit.   He reports that his memory is "pretty good ".  Wife disagrees, stating that he has apathy and lack of  insight into his condition.   He continues to repeat the same sentences and questions.  He still cannot keep time frames, "everything is blurred, he cannot remember if something happened yesterday or 2 weeks ago ".  He states that his mood is good, denies depression or irritability. He continues to have an obsession with trash, taking it and rearranging it.  She is in charge of the bills and medications. Appetite is normal.  He no longer cooks. There are hygiene concerns, as he has decreased interest in changing his underwear and clothes or take a shower or bath.  He does not like to walk, physical therapy comes to the house, and when they are there, he is cooperative, but he will not independently try to exercise or walk.  No head injuries or falls.  He continues to drive short distances, and familiar places.  He  likes to play solitaire on his phone, trivia, he is reading less.  He sleep is good, and has some vivid dreams without sleepwalking. At times he confuses the days and nights.  He continues to have urine hesitancy because of prostatic issues. He was recently diagnosed with prostate cancer, to receive radiation soon. No hallucinations or paranoia are reported. Denies headaches, double vision, dizziness, focal numbness or tingling, unilateral weakness or tremors. No constipation or diarrhea. Denies anosmia.    Initial Evaluation 06/04/21 The patient is seen in neurologic consultation at the request of Jason Estrada, Jason Estrada, Jason Estrada for the  evaluation of memory.  The patient is accompanied by his wife who supplements the history. 44 y.o. year old male who has had memory issues for about 1 year when he noticed worsening short term memory deficiencies. "He pauses till he can find the word" -wife says.  He misses appointments frequently. He moved from [place] 1 year ago, and his wife would travel from there to [place] during this time, able to see these changes. "This was worse until 4 weeks ago", somehow it is a little  bit better but not much". He does repeat the same stories and questions. He denies depression or irritability. His mood is "good". He  likes to play solitaire on his phone, trivia, reading and walking. He sleeps better than when first moving, He has some vivid dreams without  sleepwalking. At times he confuses the days and nights. "He wanted to eat breakfast at midnight".  In July he was confused, found to have elevated PSA and was referred to a urologist, workup ongoing. He has some urine hesitancy because of prostatic issues. Currently denies hallucinations or paranoia. Denies leaving objects in unusual places.   Wife reports issues with hygiene."Bathing since around July, is a battle, and he likes to use the same underwear". Wife administers the medications and manages the bills, because he fell victim to scams. His appetite is good and denies trouble swallowing, He cooks and denies leaving the stove on. Wife supervises . He ambulates without devices, but is prone to falls due to gout. Denies head injuries.    He drives with GPS and wife is concerned because he relies on it and "still gets lost, took me to the wrong place the other day". He has issues with time gap as well. He believes that he has been in [place] longer, no sense of when and where he is".  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 Recently has  worked for [employer]. Denies headaches, double vision, dizziness, focal numbness or tingling, unilateral weakness or tremors.  No constipation or diarrhea. Denies anosmia. Denies history of OSA, ETOH or Tobacco. Family History negative for dementia.   Labs on 04/22/2021 CBC with hemoglobin 12.3/hematocrit 37.9 (in the setting of CKD) otherwise normal Creatinine 2.7 (CKD) Uric acid 9 (gout) B12 427 Folate RPR TSH 0.905 T4 1.71 RPR non reactive PSA 41.1 (has been referred to urology)  Lipid panel normal   Neuropsychological evaluation Dr. Melvyn Novas 10/11/21  evaluation on 10/03/2021. Please refer to that encounter for the full report and recommendations. Briefly, results suggested severe impairment across all aspects of learning and memory. Additional impairment was exhibited across semantic fluency and an isolated line orientation task, while performance variability was exhibited across executive functioning. Regarding etiology, I have notable concerns surrounding the presence of Alzheimer's disease and feel that this represents the most likely culprit for ongoing cognitive decline. Mr. Imhof did not benefit from repeated exposure to information across learning trials, was essentially amnestic across all memory tasks after a short delay, and generally performed below expectation across yes/no recognition tasks. All of which suggests the presence of rapid forgetting and an evolving and already fairly significant memory storage deficit, both of which are hallmark characteristics of Alzheimer's disease. Further impairment in semantic fluency is consistent with this disease process and its normal progression. His wife's reporting of him rapidly forgetting information and repeating himself often also aligns well with Alzheimer's disease.    PREVIOUS MEDICATIONS:   CURRENT MEDICATIONS:  Outpatient Encounter  Medications as of 11/13/2021  Medication Sig   allopurinol (ZYLOPRIM) 100 MG tablet Take 1 tablet (100 mg total) by mouth daily.   baclofen (LIORESAL) 10 MG tablet TAKE 1 TABLET BY MOUTH EVERY DAY AS NEEDED   calcitRIOL (ROCALTROL) 0.25 MCG capsule Take 1 capsule (0.25 mcg total) by mouth daily.   colchicine 0.6 MG tablet TAKE 1 TABLET BY MOUTH EVERY DAY   folic acid (FOLVITE) 1 MG tablet TAKE 1 TABLET BY MOUTH EVERY DAY   hydrALAZINE (APRESOLINE) 50 MG tablet Take 0.5 tablets (25 mg total) by mouth in the morning and at bedtime.   metoprolol succinate (TOPROL-XL) 25 MG 24 hr tablet Take 25 mg by mouth daily.   pantoprazole (PROTONIX) 20 MG tablet Take 1  tablet (20 mg total) by mouth daily.   pravastatin (PRAVACHOL) 40 MG tablet Take 1 tablet (40 mg total) by mouth daily.   verapamil (CALAN-SR) 240 MG CR tablet Take 1 tablet (240 mg total) by mouth daily.   [DISCONTINUED] donepezil (ARICEPT) 10 MG tablet Take half tablet (5 mg) daily for 2 weeks, then increase to the full tablet at 10 mg daily   donepezil (ARICEPT) 10 MG tablet Take 1 tablet at 10 mg daily   No facility-administered encounter medications on file as of 11/13/2021.     Objective:     PHYSICAL EXAMINATION:    VITALS:   Vitals:   11/13/21 1055  BP: (!) 152/74  Pulse: 77  Resp: 18  SpO2: 97%  Weight: 199 lb (90.3 kg)  Height: 6' (1.829 m)    GEN:  The patient appears stated age and is in NAD. HEENT:  Normocephalic, atraumatic.   Neurological examination:  General: NAD, well-groomed, appears stated age. Orientation: The patient is alert. Oriented to person, place and not to date  Cranial nerves: There is good facial symmetry.The speech is fluent and clear. No aphasia or dysarthria. Fund of knowledge is appropriate. Recent and remote memory are impaired. Attention and concentration are reduced.  Able to name objects and repeat phrases.  Hearing is intact to conversational tone.    Sensation: Sensation is intact to light touch throughout Motor: Strength is at least antigravity x4. Tremors: none  DTR's 2/4 in [place] Cognitive Assessment  06/01/2021  Visuospatial/ Executive (0/5) 4  Naming (0/3) 3  Attention: Read list of digits (0/2) 2  Attention: Read list of letters (0/1) 0  Attention: Serial 7 subtraction starting at 100 (0/3) 1  Language: Repeat phrase (0/2) 2  Language : Fluency (0/1) 0  Abstraction (0/2) 0  Delayed Recall (0/5) 0  Orientation (0/6) 4  Total 16  Adjusted Score (based on education) 17   No flowsheet data found.  No flowsheet data found.     Movement examination: Tone: There is normal tone in the UE/LE Abnormal  movements:  no tremor.  No myoclonus.  No asterixis.   Coordination:  There is no decremation with RAM's. Normal finger to nose  Gait and Station: The patient has no difficulty arising out of a deep-seated chair without the use of the hands. The patient's stride length is good.  Gait is cautious and narrow.      Total time spent on today's visit was 30  minutes, including both face-to-face time and nonface-to-face time. Time included that spent on review of records (prior notes available to me/labs/imaging if pertinent), discussing treatment and goals, answering patient's questions and coordinating care.  Cc:  Jason Cousins, Jason Estrada Jason Estrada  Jason Route, PA-C

## 2021-11-13 ENCOUNTER — Ambulatory Visit (INDEPENDENT_AMBULATORY_CARE_PROVIDER_SITE_OTHER): Payer: Federal, State, Local not specified - PPO | Admitting: Physician Assistant

## 2021-11-13 ENCOUNTER — Encounter: Payer: Self-pay | Admitting: Physician Assistant

## 2021-11-13 ENCOUNTER — Other Ambulatory Visit: Payer: Self-pay

## 2021-11-13 VITALS — BP 152/74 | HR 77 | Resp 18 | Ht 72.0 in | Wt 199.0 lb

## 2021-11-13 DIAGNOSIS — G309 Alzheimer's disease, unspecified: Secondary | ICD-10-CM

## 2021-11-13 DIAGNOSIS — F0280 Dementia in other diseases classified elsewhere without behavioral disturbance: Secondary | ICD-10-CM

## 2021-11-13 MED ORDER — DONEPEZIL HCL 10 MG PO TABS: ORAL_TABLET | ORAL | 11 refills | Status: DC

## 2021-11-13 NOTE — Patient Instructions (Signed)
It was a pleasure to see you today at our office.   Recommendations:  Follow up in 6 months  Continue Donepezil 10 mg daily.   Please feel free to call Arnell Asal, Social Worker at [facility] at [phone] for guidance Also, you can call [facility] [phone] for guidance about Day programs  No more driving for safety reason    RECOMMENDATIONS FOR ALL PATIENTS WITH MEMORY PROBLEMS: 1. Continue to exercise (Recommend 30 minutes of walking everyday, or 3 hours every week) 2. Increase social interactions - continue going to [place] and enjoy social gatherings with friends and family 3. Eat healthy, avoid fried foods and eat more fruits and vegetables 4. Maintain adequate blood pressure, blood sugar, and blood cholesterol level. Reducing the risk of stroke and cardiovascular disease also helps promoting better memory. 5. Avoid stressful situations. Live a simple life and avoid aggravations. Organize your time and prepare for the next day in anticipation. 6. Sleep well, avoid any interruptions of sleep and avoid any distractions in the bedroom that may interfere with adequate sleep quality 7. Avoid sugar, avoid sweets as there is a strong link between excessive sugar intake, diabetes, and cognitive impairment We discussed the Mediterranean diet, which has been shown to help patients reduce the risk of progressive memory disorders and reduces cardiovascular risk. This includes eating fish, eat fruits and green leafy vegetables, nuts like almonds and hazelnuts, walnuts, and also use olive oil. Avoid fast foods and fried foods as much as possible. Avoid sweets and sugar as sugar use has been linked to worsening of memory function.  There is always a concern of gradual progression of memory problems. If this is the case, then we may need to adjust level of care according to patient needs. Support, both to the patient and car   FALL PRECAUTIONS: Be cautious when walking.  Scan the area for obstacles that may increase the risk of trips and falls. When getting up in the mornings, sit up at the edge of the bed for a few minutes before getting out of bed. Consider elevating the bed at the head end to avoid drop of blood pressure when getting up. Walk always in a well-lit room (use night lights in the walls). Avoid area rugs or power cords from appliances in the middle of the walkways. Use a walker or a cane if necessary and consider physical therapy for balance exercise. Get your eyesight checked regularly.  FINANCIAL OVERSIGHT: Supervision, especially oversight when making financial decisions or transactions is also recommended.  HOME SAFETY: Consider the safety of the kitchen when operating appliances like stoves, microwave oven, and blender. Consider having supervision and share cooking responsibilities until no longer able to participate in those. Accidents with firearms and other hazards in the house should be identified and addressed as well.   ABILITY TO BE LEFT ALONE: If patient is unable to contact 911 operator, consider using LifeLine, or when the need is there, arrange for someone to stay with patients. Smoking is a fire hazard, consider supervision or cessation. Risk of wandering should be assessed by caregiver and if detected at any point, supervision and safe proof recommendations should be instituted.  MEDICATION SUPERVISION: Inability to self-administer medication needs to be constantly addressed. Implement a mechanism to ensure safe administration of the medications.   DRIVING: Regarding driving, in patients with progressive memory problems, driving will be impaired. We advise to have someone else do the driving if trouble finding directions or if minor accidents  are reported. Independent driving assessment is available to determine safety of driving.   If you are interested in the driving assessment, you can contact the following:  [facility] [employer]  in [place]  [place] [phone] or [phone]    [place] refers to food and lifestyle choices that are based on the traditions of countries located on the [employer]. This way of eating has been shown to help prevent certain conditions and improve outcomes for people who have chronic diseases, like kidney disease and heart disease. What are tips for following this plan? Lifestyle  Cook and eat meals together with your family, when possible. Drink enough fluid to keep your urine clear or pale yellow. Be physically active every day. This includes: Aerobic exercise like running or swimming. Leisure activities like gardening, walking, or housework. Get 7-8 hours of sleep each night. If recommended by your health care provider, drink red wine in moderation. This means 1 glass a day for nonpregnant women and 2 glasses a day for men. A glass of wine equals 5 oz (150 mL). Reading food labels  Check the serving size of packaged foods. For foods such as rice and pasta, the serving size refers to the amount of cooked product, not dry. Check the total fat in packaged foods. Avoid foods that have saturated fat or trans fats. Check the ingredients list for added sugars, such as corn syrup. Shopping  At the grocery store, buy most of your food from the areas near the walls of the store. This includes: Fresh fruits and vegetables (produce). Grains, beans, nuts, and seeds. Some of these may be available in unpackaged forms or large amounts (in bulk). Fresh seafood. Poultry and eggs. Low-fat dairy products. Buy whole ingredients instead of prepackaged foods. Buy fresh fruits and vegetables in-season from local farmers markets. Buy frozen fruits and vegetables in resealable bags. If you do not have access to quality fresh seafood, buy precooked frozen  shrimp or canned fish, such as tuna, salmon, or sardines. Buy small amounts of raw or cooked vegetables, salads, or olives from the deli or salad bar at your store. Stock your pantry so you always have certain foods on hand, such as olive oil, canned tuna, canned tomatoes, rice, pasta, and beans. Cooking  Cook foods with extra-virgin olive oil instead of using butter or other vegetable oils. Have meat as a side dish, and have vegetables or grains as your main dish. This means having meat in small portions or adding small amounts of meat to foods like pasta or stew. Use beans or vegetables instead of meat in common dishes like chili or lasagna. Experiment with different cooking methods. Try roasting or broiling vegetables instead of steaming or sauteing them. Add frozen vegetables to soups, stews, pasta, or rice. Add nuts or seeds for added healthy fat at each meal. You can add these to yogurt, salads, or vegetable dishes. Marinate fish or vegetables using olive oil, lemon juice, garlic, and fresh herbs. Meal planning  Plan to eat 1 vegetarian meal one day each week. Try to work up to 2 vegetarian meals, if possible. Eat seafood 2 or more times a week. Have healthy snacks readily available, such as: Vegetable sticks with hummus. Greek yogurt. Fruit and nut trail mix. Eat balanced meals throughout the week. This includes: Fruit: 2-3 servings a day Vegetables: 4-5 servings a day Low-fat dairy: 2 servings a day Fish,  poultry, or lean meat: 1 serving a day Beans and legumes: 2 or more servings a week Nuts and seeds: 1-2 servings a day Whole grains: 6-8 servings a day Extra-virgin olive oil: 3-4 servings a day Limit red meat and sweets to only a few servings a month What are my food choices? Mediterranean diet Recommended Grains: Whole-grain pasta. Brown rice. Bulgar wheat. Polenta. Couscous. Whole-wheat bread. [place]. Vegetables: Artichokes. Beets. Broccoli. Cabbage. Carrots.  Eggplant. Green beans. Chard. Kale. Spinach. Onions. Leeks. Peas. Squash. Tomatoes. Peppers. Radishes. Fruits: Apples. Apricots. Avocado. Berries. Bananas. Cherries. Dates. Figs. Grapes. Lemons. Melon. Oranges. Peaches. Plums. Pomegranate. Meats and other protein foods: Beans. Almonds. Sunflower seeds. Pine nuts. Peanuts. [place]. Salmon. Scallops. Shrimp. [place]. Tilapia. Clams. Oysters. Eggs. Dairy: Low-fat milk. Cheese. Greek yogurt. Beverages: Water. Red wine. Herbal tea. Fats and oils: Extra virgin olive oil. Avocado oil. Grape seed oil. Sweets and desserts: [place] yogurt with honey. Baked apples. Poached pears. Trail mix. Seasoning and other foods: Basil. Cilantro. Coriander. Cumin. Mint. Parsley. Sage. Rosemary. Tarragon. Garlic. Oregano. Thyme. Pepper. Balsalmic vinegar. Tahini. Hummus. Tomato sauce. Olives. Mushrooms. Limit these Grains: Prepackaged pasta or rice dishes. Prepackaged cereal with added sugar. Vegetables: Deep fried potatoes (french fries). Fruits: Fruit canned in syrup. Meats and other protein foods: Beef. Pork. Lamb. Poultry with skin. Hot dogs. [place] Salines. Dairy: Ice cream. Sour cream. Whole milk. Beverages: Juice. Sugar-sweetened soft drinks. Beer. Liquor and spirits. Fats and oils: Butter. Canola oil. Vegetable oil. Beef fat (tallow). Lard. Sweets and desserts: Cookies. Cakes. Pies. Candy. Seasoning and other foods: Mayonnaise. Premade sauces and marinades. The items listed may not be a complete list. Talk with your dietitian about what dietary choices are right for you. Summary The Mediterranean diet includes both food and lifestyle choices. Eat a variety of fresh fruits and vegetables, beans, nuts, seeds, and whole grains. Limit the amount of red meat and sweets that you eat. Talk with your health care provider about whether it is safe for you to drink red wine in moderation. This means 1 glass a day for nonpregnant women and 2 glasses a day for men. A glass of wine equals 5  oz (150 mL). This information is not intended to replace advice given to you by your health care provider. Make sure you discuss any questions you have with your health care provider. Document Released: 04/25/2016 Document Revised: 05/28/2016 Document Reviewed: 04/25/2016 Elsevier Interactive Patient Education  2017 [employer].

## 2021-11-21 ENCOUNTER — Encounter (HOSPITAL_COMMUNITY)
Admission: RE | Admit: 2021-11-21 | Discharge: 2021-11-21 | Disposition: A | Discharge status: Home / Self Care | Payer: Federal, State, Local not specified - PPO | Source: Ambulatory Visit | Attending: Urology | Admitting: Urology

## 2021-11-21 ENCOUNTER — Other Ambulatory Visit: Payer: Self-pay | Admitting: Internal Medicine

## 2021-11-21 ENCOUNTER — Other Ambulatory Visit: Payer: Self-pay

## 2021-11-21 DIAGNOSIS — C61 Malignant neoplasm of prostate: Secondary | ICD-10-CM | POA: Diagnosis not present

## 2021-11-21 MED ORDER — PIFLIFOLASTAT F 18 (PYLARIFY) INJECTION
9.0000 | Freq: Once | INTRAVENOUS | Status: AC
  Administered 2021-11-21: 8.84 via INTRAVENOUS

## 2021-11-22 ENCOUNTER — Other Ambulatory Visit: Payer: Self-pay

## 2021-11-22 ENCOUNTER — Encounter: Payer: Self-pay | Admitting: Internal Medicine

## 2021-11-22 MED ORDER — FOLIC ACID 1 MG PO TABS: 1.0000 mg | ORAL_TABLET | Freq: Every day | ORAL | 1 refills | Status: DC

## 2021-11-22 NOTE — Telephone Encounter (Signed)
Folic acid has refill remaining. ?Requested Prescriptions  ?Pending Prescriptions Disp Refills  ?? baclofen (LIORESAL) 10 MG tablet [Pharmacy Med Name: BACLOFEN 10 MG TABLET] 30 tablet 0  ?  Sig: TAKE 1 TABLET BY MOUTH EVERY DAY AS NEEDED  ?  ? Analgesics:  Muscle Relaxants - baclofen Failed - 11/21/2021 11:34 AM  ?  ?  Failed - Cr in normal range and within 180 days  ?  Creatinine, Ser  ?Date Value Ref Range Status  ?08/06/2021 2.06 (H) 0.76 - 1.27 mg/dL Final  ?   ?  ?  Passed - eGFR is 30 or above and within 180 days  ?  GFR, Estimated  ?Date Value Ref Range Status  ?04/23/2021 24 (L) >60 mL/min Final  ?  Comment:  ?  (NOTE) ?Calculated using the CKD-EPI Creatinine Equation (2021) ?  ? ?eGFR  ?Date Value Ref Range Status  ?08/06/2021 32 (L) >59 mL/min/1.73 Final  ?   ?  ?  Passed - Valid encounter within last 6 months  ?  Recent Outpatient Visits   ?      ? 1 month ago High prostate specific antigen (PSA)  ? [facility] [name], [name], MD  ? 2 months ago Need for influenza vaccination  ? [facility] [name], [name], MD  ? 3 months ago Tachycardia  ? [facility] [name], [name], MD  ? 5 months ago Gout, unspecified cause, unspecified chronicity, unspecified site  ? [facility] [name], [name], MD  ? 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 [facility] [name], [name], MD  ?  ?  ?Future Appointments   ?        ? In 1 week [name], [name], MD [facility]  ?  ? ?  ?  ?  ?Refused Prescriptions Disp Refills  ?? folic acid (FOLVITE) 1 MG tablet [Pharmacy Med Name: FOLIC ACID 1 MG TABLET] 30 tablet 1  ?  Sig: TAKE 1 TABLET BY MOUTH EVERY DAY  ?  ? Endocrinology:  Vitamins Passed - 11/21/2021 11:34 AM  ?  ?  Passed - Valid encounter within last 12 months  ?  Recent Outpatient Visits   ?      ? 1 month ago High prostate specific antigen (PSA)  ? [facility] [name], [name], MD  ? 2 months ago Need for influenza vaccination  ? [facility] [name], [name], MD  ? 3 months ago Tachycardia  ? [facility] [name], [name], MD  ? 5 months ago Gout, unspecified cause, unspecified chronicity, unspecified site  ? [facility] [name], [name], MD  ? 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 [facility] [name], [name], MD  ?  ?  ?Future Appointments   ?        ? In 1 week [name], [name], MD [facility]  ?  ? ?  ?  ?  ? ? ?

## 2021-11-23 ENCOUNTER — Ambulatory Visit
Admission: RE | Admit: 2021-11-23 | Discharge: 2021-11-23 | Disposition: A | Discharge status: Home / Self Care | Payer: Federal, State, Local not specified - PPO | Source: Ambulatory Visit | Attending: Radiation Oncology | Admitting: Radiation Oncology

## 2021-11-23 ENCOUNTER — Other Ambulatory Visit: Payer: Self-pay

## 2021-11-23 ENCOUNTER — Encounter: Payer: Self-pay | Admitting: Radiation Oncology

## 2021-11-23 VITALS — BP 149/68 | HR 64 | Temp 97.4°F | Resp 18 | Ht 72.0 in | Wt 199.1 lb

## 2021-11-23 DIAGNOSIS — C61 Malignant neoplasm of prostate: Secondary | ICD-10-CM | POA: Diagnosis present

## 2021-11-23 NOTE — Progress Notes (Signed)
Radiation Oncology ?Follow up Note ? ?Name: Jason Estrada   ?Date:   11/23/2021 ?MRN:  [id number] ?DOB: [birth year]  ? ? ?This 81 y.o. 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 ? ?REFERRING PROVIDER: Charlynne Cousins, MD ? ?HPI: Patient is a 81 year old male originally consulted back in February.  He presented with a PSA in the 50 range.  MRI scan of his prostate showed a PI-RADS category 5 lesion right anterior and mid gland tracking towards the base suspicious for carcinoma with early extracapsular extension.  No evidence of pelvic lymphadenopathy or bony lesions in the pelvis.  4 of 12 cores on fusion biopsy were positive for Gleason 7 (3+4) adenocarcinoma with acinar features.  We ordered a PSMA PET scan showing only hypermetabolic activity in his prostate no evidence of positive nodes or metastatic disease to bone.  He is seen today for discussion of those results. ? ?COMPLICATIONS OF TREATMENT: none ? ?FOLLOW UP COMPLIANCE: keeps appointments  ? ?PHYSICAL EXAM:  ?BP (!) 149/68   Pulse 64   Temp (!) 97.4 ?F (36.3 ?C)   Resp 18   Ht 6' (1.829 m)   Wt 199 lb 1.6 oz (90.3 kg)   BMI 27.00 kg/m?  ?Well-developed well-nourished patient in NAD. HEENT reveals PERLA, EOMI, discs not visualized.  Oral cavity is clear. No oral mucosal lesions are identified. Neck is clear without evidence of cervical or supraclavicular adenopathy. Lungs are clear to A&P. Cardiac examination is essentially unremarkable with regular rate and rhythm without murmur rub or thrill. Abdomen is benign with no organomegaly or masses noted. Motor sensory and DTR levels are equal and symmetric in the upper and lower extremities. Cranial nerves II through XII are grossly intact. Proprioception is intact. No peripheral adenopathy or edema is identified. No motor or sensory levels are noted. Crude visual fields are within  normal range. ? ?RADIOLOGY RESULTS: PET scan reviewed compatible with above-stated findings ? ?PLAN: At this time I will treat this as a stage IIIb adenocarcinoma of the prostate.  I have asked urology to place fiducial markers in his prostate for daily image guided IMRT treatment.  We will treat his prostate to Vivian treating his pelvic lymph nodes to [address] using IMRT dose painting technique.  I have also asked urology to start Eligard and he may benefit from that for up to 3 years.  Risks and benefits of treatment including increased lower Neri tract symptoms diarrhea fatigue alteration of blood counts skin reaction all were reviewed in detail with the patient and his wife.  I have referred him back to urology for marker placement as well as ADT therapy.  We will simulate the patient after markers are placed. ? ?I would like to take this opportunity to thank you for allowing me to participate in the care of your patient.. ?  ? Noreene Filbert, MD ? ?

## 2021-11-29 ENCOUNTER — Telehealth: Payer: Self-pay

## 2021-11-29 NOTE — Telephone Encounter (Signed)
Benefits eligibility faxed for Lupron/Eligard, awaiting response.  ?

## 2021-11-29 NOTE — Telephone Encounter (Signed)
-----   Message from Benard Halsted sent at 11/23/2021  3:39 PM EST ----- ?Regarding: RE: Girtha Rm seed markers/Eligard ?He has been scheduled for 12/10/21 @ 1:30 ?Thanks, ?Sharyn Lull ?----- Message ----- ?From: Daiva Huge, RN ?Sent: 11/23/2021   9:57 AM EST ?To: Tammy M Bell, Kimberly W Dawkins, RN, # ?Subject: Gold seed markers/Eligard                     ? ?Good morning,  ? ?Jason Estrada will need gold seed marker placed and Eligard injection with Dr. Diamantina Providence.  He has the markers with him.  Just let us know when you get him scheduled so we can get his sim appointment.  ? ?Thanks,  ? ?Ana  ? ? ? ?

## 2021-11-30 ENCOUNTER — Other Ambulatory Visit: Payer: Self-pay | Admitting: Internal Medicine

## 2021-11-30 MED ORDER — PRAVASTATIN SODIUM 40 MG PO TABS: 40.0000 mg | ORAL_TABLET | Freq: Every day | ORAL | 4 refills | Status: DC

## 2021-11-30 NOTE — Telephone Encounter (Signed)
Requested Prescriptions  ?Pending Prescriptions Disp Refills  ?? pravastatin (PRAVACHOL) 40 MG tablet 30 tablet 4  ?  Sig: Take 1 tablet (40 mg total) by mouth daily.  ?  ? Cardiovascular:  Antilipid - Statins Failed - 11/30/2021 12:48 PM  ?  ?  Failed - Lipid Panel in normal range within the last 12 months  ?  Cholesterol, Total  ?Date Value Ref Range Status  ?08/06/2021 169 100 - 199 mg/dL Final  ? ?LDL Chol Calc (NIH)  ?Date Value Ref Range Status  ?08/06/2021 89 0 - 99 mg/dL Final  ? ?HDL  ?Date Value Ref Range Status  ?08/06/2021 62 >39 mg/dL Final  ? ?Triglycerides  ?Date Value Ref Range Status  ?08/06/2021 97 0 - 149 mg/dL Final  ? ?  ?  ?  Passed - Patient is not pregnant  ?  ?  Passed - Valid encounter within last 12 months  ?  Recent Outpatient Visits   ?      ? 1 month ago High prostate specific antigen (PSA)  ? [facility] [name], [name], MD  ? 3 months ago Need for influenza vaccination  ? [facility] [name], [name], MD  ? 3 months ago Tachycardia  ? [facility] [name], [name], MD  ? 5 months ago Gout, unspecified cause, unspecified chronicity, unspecified site  ? [facility] [name], [name], MD  ? 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 [facility] [name], [name], MD  ?  ?  ?Future Appointments   ?        ? In 5 days [name], [name], MD [facility]  ?  ? ?  ?  ?  ? ?

## 2021-11-30 NOTE — Telephone Encounter (Signed)
Medication Refill - Medication: pravastatin (PRAVACHOL) 40 MG tablet ? ?Has the patient contacted their pharmacy? No. ? ?(Agent: If no, request that the patient contact the pharmacy for the refill. If patient does not wish to contact the pharmacy document the reason why and proceed with request.) Patient states he wanted to call his PCP directly advised patient in the future his provider would rather him call pharmacy and have pharmacy send in request ? ? ?Preferred Pharmacy (with phone number or street name):  ? ?[id number] [place] - [address] Phone:  [phone] ?Fax:  [phone] ?  ? ? ?Has the patient been seen for an appointment in the last year OR does the patient have an upcoming appointment? Yes.   ? ?Agent: Please be advised that RX refills may take up to 3 business days. We ask that you follow-up with your pharmacy. ?

## 2021-12-03 ENCOUNTER — Ambulatory Visit: Payer: Federal, State, Local not specified - PPO | Admitting: Internal Medicine

## 2021-12-04 NOTE — Telephone Encounter (Signed)
Incoming fax, no Pa required. Pt scheduled.  ?

## 2021-12-05 ENCOUNTER — Ambulatory Visit: Payer: Federal, State, Local not specified - PPO | Admitting: Internal Medicine

## 2021-12-05 ENCOUNTER — Other Ambulatory Visit: Payer: Self-pay

## 2021-12-05 ENCOUNTER — Encounter: Payer: Self-pay | Admitting: Internal Medicine

## 2021-12-05 VITALS — BP 136/73 | HR 67 | Temp 97.8°F | Ht 72.01 in | Wt 202.8 lb

## 2021-12-05 DIAGNOSIS — E785 Hyperlipidemia, unspecified: Secondary | ICD-10-CM

## 2021-12-05 DIAGNOSIS — M109 Gout, unspecified: Secondary | ICD-10-CM | POA: Diagnosis not present

## 2021-12-05 DIAGNOSIS — I10 Essential (primary) hypertension: Secondary | ICD-10-CM

## 2021-12-05 DIAGNOSIS — F0280 Dementia in other diseases classified elsewhere without behavioral disturbance: Secondary | ICD-10-CM

## 2021-12-05 DIAGNOSIS — G309 Alzheimer's disease, unspecified: Secondary | ICD-10-CM | POA: Diagnosis not present

## 2021-12-05 NOTE — Progress Notes (Signed)
? ?BP 136/73   Pulse 67   Temp 97.8 ?F (36.6 ?C) (Oral)   Ht 6' 0.01" (1.829 m)   Wt 202 lb 12.8 oz (92 kg)   SpO2 98%   BMI 27.50 kg/m?   ? ?Subjective:  ? ? Patient ID: Jason Estrada, male    DOB: 02/11/1941, 81 y.o.   MRN: [id number] ? ?Chief Complaint  ?Patient presents with  ?? Benign Prostatic Hypertrophy  ?  Abnormal PSA level, starting radiation soon  ?? Dementia  ? ? ?HPI: ?Jason Estrada is a 81 y.o. male ? ?Benign Prostatic Hypertrophy ?This is a chronic (prostaet cancer diagnosed on recnet MRI/ Bx pt to have radiation for such per urology and onc.) problem. The current episode started more than 1 year ago. The problem has been gradually worsening since onset. Irritative symptoms do not include frequency or urgency. Pertinent negatives include no chills, dysuria, hematuria, nausea or vomiting.  ? ?Chief Complaint  ?Patient presents with  ?? Benign Prostatic Hypertrophy  ?  Abnormal PSA level, starting radiation soon  ?? Dementia  ? ? ?Relevant past medical, surgical, family and social history reviewed and updated as indicated. Interim medical history since our last visit reviewed. ?Allergies and medications reviewed and updated. ? ?Review of Systems  ?Constitutional:  Negative for activity change, appetite change, chills, fatigue and fever.  ?HENT:  Negative for congestion, ear discharge, ear pain and facial swelling.   ?Eyes:  Negative for pain, discharge and itching.  ?Respiratory:  Negative for cough, chest tightness, shortness of breath and wheezing.   ?Cardiovascular:  Negative for chest pain, palpitations and leg swelling.  ?Gastrointestinal:  Negative for abdominal distention, abdominal pain, blood in stool, constipation, diarrhea, nausea and vomiting.  ?Endocrine: Negative for cold intolerance, heat intolerance, polydipsia, polyphagia and polyuria.  ?Genitourinary:  Negative for difficulty urinating, dysuria, flank pain, frequency, hematuria and urgency.  ?Musculoskeletal:  Negative for  arthralgias, gait problem, joint swelling and myalgias.  ?Skin:  Negative for color change, rash and wound.  ?Neurological:  Negative for dizziness, tremors, speech difficulty, weakness, light-headedness, numbness and headaches.  ?Hematological:  Does not bruise/bleed easily.  ?Psychiatric/Behavioral:  Negative for agitation, confusion, decreased concentration, sleep disturbance and suicidal ideas.   ? ?Per HPI unless specifically indicated above ? ?   ?Objective:  ?  ?BP 136/73   Pulse 67   Temp 97.8 ?F (36.6 ?C) (Oral)   Ht 6' 0.01" (1.829 m)   Wt 202 lb 12.8 oz (92 kg)   SpO2 98%   BMI 27.50 kg/m?   ?Wt Readings from Last 3 Encounters:  ?12/05/21 202 lb 12.8 oz (92 kg)  ?11/23/21 199 lb 1.6 oz (90.3 kg)  ?11/13/21 199 lb (90.3 kg)  ?  ?Physical Exam ?Vitals and nursing note reviewed.  ?Constitutional:   ?   General: He is not in acute distress. ?   Appearance: Normal appearance. He is not ill-appearing or diaphoretic.  ?HENT:  ?   Head: Normocephalic and atraumatic.  ?   Right Ear: Tympanic membrane and external ear normal. There is no impacted cerumen.  ?   Left Ear: External ear normal.  ?   Nose: No congestion or rhinorrhea.  ?   Mouth/Throat:  ?   Pharynx: No oropharyngeal exudate or posterior oropharyngeal erythema.  ?Eyes:  ?   Conjunctiva/sclera: Conjunctivae normal.  ?   Pupils: Pupils are equal, round, and reactive to light.  ?Cardiovascular:  ?   Rate and Rhythm: Normal rate and regular rhythm.  ?  Heart sounds: No murmur heard. ?  No friction rub. No gallop.  ?Pulmonary:  ?   Effort: No respiratory distress.  ?   Breath sounds: No stridor. No wheezing or rhonchi.  ?Chest:  ?   Chest wall: No tenderness.  ?Abdominal:  ?   General: Abdomen is flat. Bowel sounds are normal.  ?   Palpations: Abdomen is soft. There is no mass.  ?   Tenderness: There is no abdominal tenderness.  ?Musculoskeletal:  ?   Cervical back: Normal range of motion and neck supple. No rigidity or tenderness.  ?   Left lower  leg: No edema.  ?Skin: ?   General: Skin is warm and dry.  ?Neurological:  ?   Mental Status: He is alert.  ? ? ?Results for orders placed or performed in visit on 10/25/21  ?Surgical pathology  ?Result Value Ref Range  ? SURGICAL PATHOLOGY    ?  SURGICAL PATHOLOGY ?CASE: [id number] ?PATIENT: Jason Estrada ?Surgical Pathology Report ? ? ? ? ?Specimen Submitted: ?A. PROSTATE, LEFT BASE ?B. PROSTATE, LEFT MID ?C. PROSTATE, LEFT APEX ?D. PROSTATE, RIGHT BASE ?E. PROSTATE, RIGHT MID ?F. PROSTATE, RIGHT APEX ?G. PROSTATE, LEFT LATERAL BASE ?H. PROSTATE, LEFT LATERAL MID ?I. PROSTATE, LEFT LATERAL APEX ?J. PROSTATE, RIGHT LATERAL BASE ?K. PROSTATE, RIGHT LATERAL MID ?L. PROSTATE, RIGHT LATERAL APEX ? ?Clinical History: Elevated PSA.  R97.20 - Primary ? ? ? ?DIAGNOSIS: ?Diagnostic Map ? ? Benign  Atypical  Malignant  HGPIN ? ? ?  Diagnostic Summary ? [A] PROSTATE, LEFT BASE:   NEGATIVE FOR MALIGNANCY. ? [B] PROSTATE, LEFT MID:   ACINAR ADENOCARCINOMA, GLEASON 3+3=6  (GG 1), ?INVOLVING 1 OF 1 CORES, MEASURING 8  MM ( 67%). ? [C] PROSTATE, LEFT APEX:   ACINAR ADENOCARCINOMA, GLEASON 3+4=7  (GG ?2), INVOLVING 1 OF 1 CORES, MEASURING 9  MM ( 69%). 11% PATTERN 4 ? [D] PROSTATE, RIGHT BASE:   NEGATIVE FOR MALIGNANCY. ? [E] PROSTATE, RIGHT  MID:   NEGATIVE FOR MALIGNANCY. ? [F] PROSTATE, RIGHT APEX:   NEGATIVE FOR MALIGNANCY. ? [G] PROSTATE, LEFT LATERAL BASE:   NEGATIVE FOR MALIGNANCY. ? [H] PROSTATE, LEFT LATERAL MID:   ACINAR ADENOCARCINOMA, GLEASON 3+3=6 ?(GG 1), INVOLVING 1 OF 1 CORES, MEASURING 5  MM ( 55%). ? [I] PROSTATE, LEFT LATERAL APEX:   ACINAR ADENOCARCINOMA, GLEASON 3+4=7 ?(GG 2), INVOLVING 1 OF 1 CORES, MEASURING 8  MM ( 67%). 13% PATTERN 4 ? [J] PROSTATE, RIGHT LATERAL BASE:   NEGATIVE FOR MALIGNANCY. ? [K] PROSTATE, RIGHT LATERAL MID:   NEGATIVE FOR MALIGNANCY. ? [L] PROSTATE, RIGHT LATERAL APEX:   NEGATIVE FOR MALIGNANCY. ? ? ? ? ?GROSS DESCRIPTION: ?A. Labeled: Left base ?Received: Formalin ?Collection time:  2:13 PM on 10/25/2021 ?Placed into formalin time: 2:13 PM on 10/25/2021 ?Number of needle core biopsy(s): 1 ?Length: 0.9 cm ?Diameter: 0.1 cm ?Description: Needle core biopsy fragment of tan soft tissue ?Ink: Blue ?Entirely submitted in 1 cassette. ? ?B. Labeled: Left mid ?Received: Formalin ?Collecti on time: 2:13 PM on 10/25/2021 ?Placed into formalin time: 2:13 PM on 10/25/2021 ?Number of needle core biopsy(s): 1 ?Length: 2 cm ?Diameter: 0.1 cm ?Description: Needle core biopsy fragment of tan soft tissue ?Ink: Blue ?Entirely submitted in 1 cassette. ? ?C. Labeled: Left apex ?Received: Formalin ?Collection time: 2:13 PM on 10/25/2021 ?Placed into formalin time: 2:13 PM on 10/25/2021 ?Number of needle core biopsy(s): 1 ?Length: 1.6 cm ?Diameter: 0.1 cm ?Description: Needle core biopsy fragment of tan soft tissue ?Ink:  Blue ?Entirely submitted in 1 cassette. ? ?D. Labeled: Right base ?Received: Formalin ?Collection time: 2:13 PM on 10/25/2021 ?Placed into formalin time: 2:13 PM on 10/25/2021 ?Number of needle core biopsy(s): 1 ?Length: 1.6 cm ?Diameter: 0.1 cm ?Description: Needle core biopsy fragment of tan soft tissue ?Ink: Blue ?Entirely submitted in 1 cassette. ? ?E. Labeled: Right mid ?Received: Formalin ?Collection time: 2:13 PM on 10/25/2021 ?Placed into formalin time: 2:13 PM on 10/25/2021  ?Number of needle core biopsy(s): 1 ?Length: 1.5 cm ?Diameter: 0.1 cm ?Description: Needle core biopsy fragment of tan soft tissue ?Ink: Blue ?Entirely submitted in 1 cassette. ? ?F. Labeled: Right apex ?Received: Formalin ?Collection time: 2:13 PM on 10/25/2021 ?Placed into formalin time: 2:13 PM on 10/25/2021 ?Number of needle core biopsy(s): 1 ?Length: 1.6 cm ?Diameter: 0.1 cm ?Description: Needle core biopsy fragment of tan soft tissue ?Ink: Blue ?Entirely submitted in 1 cassette. ? ?G. Labeled: Left lateral base ?Received: Formalin ?Collection time: 2:13 PM on 10/25/2021 ?Placed into formalin time: 2:13 PM on 10/25/2021 ?Number of needle core  biopsy(s): 2 ?Length: Range from 0.2-1.1 cm ?Diameter: 0.1 cm ?Description: Needle core biopsy fragment of tan soft tissue ?Ink: Blue ?Entirely submitted in 1 cassette. ? ?H. Labeled: Left lateral mid ?Received: Formalin ?C

## 2021-12-10 ENCOUNTER — Ambulatory Visit: Payer: Federal, State, Local not specified - PPO | Admitting: Urology

## 2021-12-10 ENCOUNTER — Other Ambulatory Visit: Payer: Self-pay

## 2021-12-10 ENCOUNTER — Encounter: Payer: Self-pay | Admitting: Urology

## 2021-12-10 VITALS — BP 134/73 | HR 76 | Ht 72.0 in | Wt 202.0 lb

## 2021-12-10 DIAGNOSIS — C61 Malignant neoplasm of prostate: Secondary | ICD-10-CM | POA: Diagnosis not present

## 2021-12-10 MED ORDER — GENTAMICIN SULFATE 40 MG/ML IJ SOLN
80.0000 mg | Freq: Once | INTRAMUSCULAR | Status: AC
  Administered 2021-12-10: 80 mg via INTRAMUSCULAR

## 2021-12-10 MED ORDER — LEVOFLOXACIN 500 MG PO TABS
500.0000 mg | ORAL_TABLET | Freq: Once | ORAL | Status: AC
  Administered 2021-12-10: 500 mg via ORAL

## 2021-12-10 MED ORDER — LEUPROLIDE ACETATE (6 MONTH) 45 MG ~~LOC~~ KIT
45.0000 mg | PACK | Freq: Once | SUBCUTANEOUS | Status: AC
  Administered 2021-12-10: 45 mg via SUBCUTANEOUS

## 2021-12-10 NOTE — Progress Notes (Signed)
Eligard SubQ Injection  ? ?Due to Prostate Cancer patient is present today for a Eligard Injection. ? ?Medication: Eligard 6 month ?Dose: 45 mg  ?Location: left upper outer quadrant   ?Lot: [id number] ?Exp: 05/2023 ? ?Patient tolerated well, no complications were noted. ? ?Performed by: Gordy Clement, [place] ? ?Per Dr. Diamantina Providence patient is to continue therapy for 2 years . Patient's next follow up was scheduled for September 2023. This appointment was scheduled using wheel and given to patient today along with reminder continue on Vitamin D 800-1000iu and Calcium 1000-'1200mg'$  daily while on Androgen Deprivation Therapy.  ?PA approval dates: No PA Required ? ?

## 2021-12-10 NOTE — Progress Notes (Signed)
? ?  12/10/21 ? ?Indication: High risk prostate cancer ? ?Prostate Gold Seed Marker Procedure  ? ?Informed consent was obtained, and we discussed the risks of bleeding and infection/sepsis. A time out was performed to ensure correct patient identity. ? ?Pre-Procedure: ?- Gentamicin and levaquin given for antibiotic prophylaxis ? ?Procedure: ?-Uncomplicated ultrasound-guided placement of gold seed markers, 2 at the bilateral bases, and one at the apex centrally ? ?Post-Procedure: ?- Patient tolerated the procedure well ?- He was counseled to seek immediate medical attention if experiences significant bleeding, fevers, or severe pain ?-ADT x65-monthinjection given today, plan 2 years duration ?-Follow-up with radiation oncology for XRT ? ? ?BNickolas Madrid MD ?12/10/2021 ? ? ?

## 2021-12-10 NOTE — Patient Instructions (Signed)
Leuprolide depot injection ?What is this medication? ?LEUPROLIDE (loo PROE lide) is a man-made protein that acts like a natural hormone in the body. It decreases testosterone in men and decreases estrogen in women. In men, this medicine is used to treat advanced prostate cancer. In women, some forms of this medicine may be used to treat endometriosis, uterine fibroids, or other male hormone-related problems. ?This medicine may be used for other purposes; ask your health care provider or pharmacist if you have questions. ?COMMON BRAND NAME(S): Eligard, Fensolv, Lupron Depot, Lupron Depot-Ped, Viadur ?What should I tell my care team before I take this medication? ?They need to know if you have any of these conditions: ?diabetes ?heart disease or previous heart attack ?high blood pressure ?high cholesterol ?mental illness ?osteoporosis ?pain or difficulty passing urine ?seizures ?spinal cord metastasis ?stroke ?suicidal thoughts, plans, or attempt; a previous suicide attempt by you or a family member ?tobacco smoker ?unusual vaginal bleeding (women) ?an unusual or allergic reaction to leuprolide, benzyl alcohol, other medicines, foods, dyes, or preservatives ?pregnant or trying to get pregnant ?breast-feeding ?How should I use this medication? ?This medicine is for injection into a muscle or for injection under the skin. It is given by a health care professional in a hospital or clinic setting. The specific product will determine how it will be given to you. Make sure you understand which product you receive and how often you will receive it. ?Talk to your pediatrician regarding the use of this medicine in children. Special care may be needed. ?Overdosage: If you think you have taken too much of this medicine contact a poison control center or emergency room at once. ?NOTE: This medicine is only for you. Do not share this medicine with others. ?What if I miss a dose? ?It is important not to miss a dose. Call your  doctor or health care professional if you are unable to keep an appointment. ?Depot injections: Depot injections are given either once-monthly, every 12 weeks, every 16 weeks, or every 24 weeks depending on the product you are prescribed. The product you are prescribed will be based on if you are male or male, and your condition. Make sure you understand your product and dosing. ?What may interact with this medication? ?Do not take this medicine with any of the following medications: ?chasteberry ?cisapride ?dronedarone ?pimozide ?thioridazine ?This medicine may also interact with the following medications: ?herbal or dietary supplements, like black cohosh or DHEA ?male hormones, like estrogens or progestins and birth control pills, patches, rings, or injections ?male hormones, like testosterone ?other medicines that prolong the QT interval (abnormal heart rhythm) ?This list may not describe all possible interactions. Give your health care provider a list of all the medicines, herbs, non-prescription drugs, or dietary supplements you use. Also tell them if you smoke, drink alcohol, or use illegal drugs. Some items may interact with your medicine. ?What should I watch for while using this medication? ?Visit your doctor or health care professional for regular checks on your progress. During the first weeks of treatment, your symptoms may get worse, but then will improve as you continue your treatment. You may get hot flashes, increased bone pain, increased difficulty passing urine, or an aggravation of nerve symptoms. Discuss these effects with your doctor or health care professional, some of them may improve with continued use of this medicine. ?Male patients may experience a menstrual cycle or spotting during the first months of therapy with this medicine. If this continues, contact your doctor or   health care professional. ?This medicine may increase blood sugar. Ask your healthcare provider if changes in diet  or medicines are needed if you have diabetes. ?What side effects may I notice from receiving this medication? ?Side effects that you should report to your doctor or health care professional as soon as possible: ?allergic reactions like skin rash, itching or hives, swelling of the face, lips, or tongue ?breathing problems ?chest pain ?depression or memory disorders ?pain in your legs or groin ?pain at site where injected or implanted ?seizures ?severe headache ?signs and symptoms of high blood sugar such as being more thirsty or hungry or having to urinate more than normal. You may also feel very tired or have blurry vision ?swelling of the feet and legs ?suicidal thoughts or other mood changes ?visual changes ?vomiting ?Side effects that usually do not require medical attention (report to your doctor or health care professional if they continue or are bothersome): ?breast swelling or tenderness ?decrease in sex drive or performance ?diarrhea ?hot flashes ?loss of appetite ?muscle, joint, or bone pains ?nausea ?redness or irritation at site where injected or implanted ?skin problems or acne ?This list may not describe all possible side effects. Call your doctor for medical advice about side effects. You may report side effects to FDA at 1-800-FDA-1088. ?Where should I keep my medication? ?This drug is given in a hospital or clinic and will not be stored at home. ?NOTE: This sheet is a summary. It may not cover all possible information. If you have questions about this medicine, talk to your doctor, pharmacist, or health care provider. ?? 2022 Elsevier/Gold Standard (2021-05-22 00:00:00) ? ?

## 2021-12-12 ENCOUNTER — Ambulatory Visit
Admission: RE | Admit: 2021-12-12 | Discharge: 2021-12-12 | Disposition: A | Discharge status: Home / Self Care | Payer: Federal, State, Local not specified - PPO | Source: Ambulatory Visit | Attending: Radiation Oncology | Admitting: Radiation Oncology

## 2021-12-12 DIAGNOSIS — C61 Malignant neoplasm of prostate: Secondary | ICD-10-CM | POA: Insufficient documentation

## 2021-12-13 ENCOUNTER — Other Ambulatory Visit: Payer: Self-pay | Admitting: *Deleted

## 2021-12-13 DIAGNOSIS — C61 Malignant neoplasm of prostate: Secondary | ICD-10-CM

## 2021-12-14 ENCOUNTER — Encounter: Payer: Federal, State, Local not specified - PPO | Admitting: Psychology

## 2021-12-17 ENCOUNTER — Other Ambulatory Visit: Payer: Self-pay | Admitting: Internal Medicine

## 2021-12-18 DIAGNOSIS — C61 Malignant neoplasm of prostate: Secondary | ICD-10-CM | POA: Insufficient documentation

## 2021-12-18 NOTE — Telephone Encounter (Signed)
Requested Prescriptions  ?Pending Prescriptions Disp Refills  ?? baclofen (LIORESAL) 10 MG tablet [Pharmacy Med Name: BACLOFEN 10 MG TABLET] 90 tablet 0  ?  Sig: TAKE 1 TABLET BY MOUTH EVERY DAY AS NEEDED  ?  ? Analgesics:  Muscle Relaxants - baclofen Failed - 12/17/2021 11:31 AM  ?  ?  Failed - Cr in normal range and within 180 days  ?  Creatinine, Ser  ?Date Value Ref Range Status  ?08/06/2021 2.06 (H) 0.76 - 1.27 mg/dL Final  ?   ?  ?  Passed - eGFR is 30 or above and within 180 days  ?  GFR, Estimated  ?Date Value Ref Range Status  ?04/23/2021 24 (L) >60 mL/min Final  ?  Comment:  ?  (NOTE) ?Calculated using the CKD-EPI Creatinine Equation (2021) ?  ? ?eGFR  ?Date Value Ref Range Status  ?08/06/2021 32 (L) >59 mL/min/1.73 Final  ?   ?  ?  Passed - Valid encounter within last 6 months  ?  Recent Outpatient Visits   ?      ? 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 [facility] [name], [name], MD  ? 1 month ago High prostate specific antigen (PSA)  ? [facility] [name], [name], MD  ? 3 months ago Need for influenza vaccination  ? [facility] [name], [name], MD  ? 4 months ago Tachycardia  ? [facility] [name], [name], MD  ? 6 months ago Gout, unspecified cause, unspecified chronicity, unspecified site  ? [facility] [name], [name], MD  ?  ?  ?Future Appointments   ?        ? In 2 months [name], [name], MD [facility]  ?  ? ?  ?  ?  ? ?

## 2021-12-20 ENCOUNTER — Ambulatory Visit: Admission: RE | Admit: 2021-12-20 | Payer: Federal, State, Local not specified - PPO | Source: Ambulatory Visit

## 2021-12-21 ENCOUNTER — Encounter: Payer: Federal, State, Local not specified - PPO | Admitting: Psychology

## 2021-12-24 ENCOUNTER — Ambulatory Visit
Admission: RE | Admit: 2021-12-24 | Discharge: 2021-12-24 | Disposition: A | Discharge status: Home / Self Care | Payer: Federal, State, Local not specified - PPO | Source: Ambulatory Visit | Attending: Radiation Oncology | Admitting: Radiation Oncology

## 2021-12-24 DIAGNOSIS — C61 Malignant neoplasm of prostate: Secondary | ICD-10-CM | POA: Diagnosis not present

## 2021-12-25 ENCOUNTER — Ambulatory Visit
Admission: RE | Admit: 2021-12-25 | Discharge: 2021-12-25 | Disposition: A | Discharge status: Home / Self Care | Payer: Federal, State, Local not specified - PPO | Source: Ambulatory Visit | Attending: Radiation Oncology | Admitting: Radiation Oncology

## 2021-12-25 DIAGNOSIS — C61 Malignant neoplasm of prostate: Secondary | ICD-10-CM | POA: Diagnosis not present

## 2021-12-26 ENCOUNTER — Ambulatory Visit
Admission: RE | Admit: 2021-12-26 | Discharge: 2021-12-26 | Disposition: A | Discharge status: Home / Self Care | Payer: Federal, State, Local not specified - PPO | Source: Ambulatory Visit | Attending: Radiation Oncology | Admitting: Radiation Oncology

## 2021-12-26 DIAGNOSIS — C61 Malignant neoplasm of prostate: Secondary | ICD-10-CM | POA: Diagnosis not present

## 2021-12-27 ENCOUNTER — Ambulatory Visit
Admission: RE | Admit: 2021-12-27 | Discharge: 2021-12-27 | Disposition: A | Discharge status: Home / Self Care | Payer: Federal, State, Local not specified - PPO | Source: Ambulatory Visit | Attending: Radiation Oncology | Admitting: Radiation Oncology

## 2021-12-27 ENCOUNTER — Encounter: Payer: Federal, State, Local not specified - PPO | Admitting: Psychology

## 2021-12-27 DIAGNOSIS — C61 Malignant neoplasm of prostate: Secondary | ICD-10-CM | POA: Diagnosis not present

## 2021-12-28 ENCOUNTER — Ambulatory Visit
Admission: RE | Admit: 2021-12-28 | Discharge: 2021-12-28 | Disposition: A | Discharge status: Home / Self Care | Payer: Federal, State, Local not specified - PPO | Source: Ambulatory Visit | Attending: Radiation Oncology | Admitting: Radiation Oncology

## 2021-12-28 DIAGNOSIS — C61 Malignant neoplasm of prostate: Secondary | ICD-10-CM | POA: Diagnosis not present

## 2021-12-30 ENCOUNTER — Other Ambulatory Visit: Payer: Self-pay | Admitting: Internal Medicine

## 2021-12-31 ENCOUNTER — Ambulatory Visit
Admission: RE | Admit: 2021-12-31 | Discharge: 2021-12-31 | Disposition: A | Discharge status: Home / Self Care | Payer: Federal, State, Local not specified - PPO | Source: Ambulatory Visit | Attending: Radiation Oncology | Admitting: Radiation Oncology

## 2021-12-31 DIAGNOSIS — C61 Malignant neoplasm of prostate: Secondary | ICD-10-CM | POA: Diagnosis not present

## 2021-12-31 NOTE — Telephone Encounter (Signed)
Requested medication (s) are due for refill today: yes ? ?Requested medication (s) are on the active medication list: yes ? ?Last refill:  09/11/21 #90/0 ? ?Future visit scheduled: yes ? ?Notes to clinic:  Unable to refill per protocol due to failed labs, no updated results.  ? ? ?  ?Requested Prescriptions  ?Pending Prescriptions Disp Refills  ? calcitRIOL (ROCALTROL) 0.25 MCG capsule [Pharmacy Med Name: CALCITRIOL 0.25 MCG CAPSULE] 90 capsule 1  ?  Sig: TAKE 1 CAPSULE BY MOUTH EVERY DAY  ?  ? Endocrinology:  Vitamins - Vitamin D Supplementation - calcitriol Failed - 12/30/2021  9:22 AM  ?  ?  Failed - Phosphate in normal range and within 360 days  ?  No results found for: PHOS  ?  ?  ?  Failed - PTH in normal range and within 360 days  ?  No results found for: IOPTH, PTHINTACTFNA, PTH  ?  ?  ?  Passed - Ca in normal range and within 360 days  ?  Calcium  ?Date Value Ref Range Status  ?08/06/2021 10.0 8.6 - 10.2 mg/dL Final  ?  ?  ?  ?  Passed - Valid encounter within last 12 months  ?  Recent Outpatient Visits   ? ?      ? 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 [facility] [name], [name], MD  ? 2 months ago High prostate specific antigen (PSA)  ? [facility] [name], [name], MD  ? 4 months ago Need for influenza vaccination  ? [facility] [name], [name], MD  ? 4 months ago Tachycardia  ? [facility] [name], [name], MD  ? 6 months ago Gout, unspecified cause, unspecified chronicity, unspecified site  ? [facility] [name], [name], MD  ? ?  ?  ?Future Appointments   ? ?        ? In 1 month [name], [name], MD [facility]  ? ?  ? ? ?  ?  ?  ? ?

## 2022-01-01 ENCOUNTER — Other Ambulatory Visit: Payer: Self-pay

## 2022-01-01 ENCOUNTER — Ambulatory Visit
Admission: RE | Admit: 2022-01-01 | Discharge: 2022-01-01 | Disposition: A | Discharge status: Home / Self Care | Payer: Federal, State, Local not specified - PPO | Source: Ambulatory Visit | Attending: Radiation Oncology | Admitting: Radiation Oncology

## 2022-01-01 DIAGNOSIS — C61 Malignant neoplasm of prostate: Secondary | ICD-10-CM | POA: Diagnosis not present

## 2022-01-01 LAB — RAD ONC ARIA SESSION SUMMARY
Course Elapsed Days: 8
Plan Fractions Treated to Date: 7
Plan Prescribed Dose Per Fraction: 2 Gy
Plan Total Fractions Prescribed: 40
Plan Total Prescribed Dose: 80 Gy
Reference Point Dosage Given to Date: 14 Gy
Reference Point Session Dosage Given: 2 Gy
Session Number: 7

## 2022-01-02 ENCOUNTER — Ambulatory Visit
Admission: RE | Admit: 2022-01-02 | Discharge: 2022-01-02 | Disposition: A | Discharge status: Home / Self Care | Payer: Federal, State, Local not specified - PPO | Source: Ambulatory Visit | Attending: Radiation Oncology | Admitting: Radiation Oncology

## 2022-01-02 ENCOUNTER — Other Ambulatory Visit: Payer: Self-pay

## 2022-01-02 DIAGNOSIS — C61 Malignant neoplasm of prostate: Secondary | ICD-10-CM | POA: Diagnosis not present

## 2022-01-02 LAB — RAD ONC ARIA SESSION SUMMARY
Course Elapsed Days: 9
Plan Fractions Treated to Date: 8
Plan Prescribed Dose Per Fraction: 2 Gy
Plan Total Fractions Prescribed: 40
Plan Total Prescribed Dose: 80 Gy
Reference Point Dosage Given to Date: 16 Gy
Reference Point Session Dosage Given: 2 Gy
Session Number: 8

## 2022-01-03 ENCOUNTER — Other Ambulatory Visit: Payer: Self-pay

## 2022-01-03 ENCOUNTER — Other Ambulatory Visit: Payer: Self-pay | Admitting: Internal Medicine

## 2022-01-03 ENCOUNTER — Ambulatory Visit
Admission: RE | Admit: 2022-01-03 | Discharge: 2022-01-03 | Disposition: A | Discharge status: Home / Self Care | Payer: Federal, State, Local not specified - PPO | Source: Ambulatory Visit | Attending: Radiation Oncology | Admitting: Radiation Oncology

## 2022-01-03 DIAGNOSIS — C61 Malignant neoplasm of prostate: Secondary | ICD-10-CM | POA: Diagnosis not present

## 2022-01-03 DIAGNOSIS — I10 Essential (primary) hypertension: Secondary | ICD-10-CM

## 2022-01-03 LAB — RAD ONC ARIA SESSION SUMMARY
Course Elapsed Days: 10
Plan Fractions Treated to Date: 9
Plan Prescribed Dose Per Fraction: 2 Gy
Plan Total Fractions Prescribed: 40
Plan Total Prescribed Dose: 80 Gy
Reference Point Dosage Given to Date: 18 Gy
Reference Point Session Dosage Given: 2 Gy
Session Number: 9

## 2022-01-03 NOTE — Telephone Encounter (Signed)
Requested Prescriptions  ?Pending Prescriptions Disp Refills  ?? verapamil (CALAN-SR) 240 MG CR tablet [Pharmacy Med Name: VERAPAMIL ER 240 MG TABLET] 90 tablet 1  ?  Sig: TAKE 1 TABLET BY MOUTH EVERY DAY  ?  ? Cardiovascular: Calcium Channel Blockers 3 Failed - 01/03/2022  2:27 AM  ?  ?  Failed - Cr in normal range and within 360 days  ?  Creatinine, Ser  ?Date Value Ref Range Status  ?08/06/2021 2.06 (H) 0.76 - 1.27 mg/dL Final  ?   ?  ?  Passed - ALT in normal range and within 360 days  ?  ALT  ?Date Value Ref Range Status  ?08/06/2021 29 0 - 44 IU/L Final  ?   ?  ?  Passed - AST in normal range and within 360 days  ?  AST  ?Date Value Ref Range Status  ?08/06/2021 40 0 - 40 IU/L Final  ?   ?  ?  Passed - Last BP in normal range  ?  BP Readings from Last 1 Encounters:  ?12/10/21 134/73  ?   ?  ?  Passed - Last Heart Rate in normal range  ?  Pulse Readings from Last 1 Encounters:  ?12/10/21 76  ?   ?  ?  Passed - Valid encounter within last 6 months  ?  Recent Outpatient Visits   ?      ? 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 [facility] [name], [name], MD  ? 2 months ago High prostate specific antigen (PSA)  ? [facility] [name], [name], MD  ? 4 months ago Need for influenza vaccination  ? [facility] [name], [name], MD  ? 4 months ago Tachycardia  ? [facility] [name], [name], MD  ? 6 months ago Gout, unspecified cause, unspecified chronicity, unspecified site  ? [facility] [name], [name], MD  ?  ?  ?Future Appointments   ?        ? In 1 month [name], [name], MD [facility]  ?  ? ?  ?  ?  ? ?

## 2022-01-04 ENCOUNTER — Other Ambulatory Visit: Payer: Self-pay

## 2022-01-04 ENCOUNTER — Ambulatory Visit
Admission: RE | Admit: 2022-01-04 | Discharge: 2022-01-04 | Disposition: A | Discharge status: Home / Self Care | Payer: Federal, State, Local not specified - PPO | Source: Ambulatory Visit | Attending: Radiation Oncology | Admitting: Radiation Oncology

## 2022-01-04 DIAGNOSIS — C61 Malignant neoplasm of prostate: Secondary | ICD-10-CM | POA: Diagnosis not present

## 2022-01-04 LAB — RAD ONC ARIA SESSION SUMMARY
Course Elapsed Days: 11
Plan Fractions Treated to Date: 10
Plan Prescribed Dose Per Fraction: 2 Gy
Plan Total Fractions Prescribed: 40
Plan Total Prescribed Dose: 80 Gy
Reference Point Dosage Given to Date: 20 Gy
Reference Point Session Dosage Given: 2 Gy
Session Number: 10

## 2022-01-05 ENCOUNTER — Other Ambulatory Visit: Payer: Self-pay | Admitting: Internal Medicine

## 2022-01-07 ENCOUNTER — Other Ambulatory Visit: Payer: Self-pay

## 2022-01-07 ENCOUNTER — Ambulatory Visit
Admission: RE | Admit: 2022-01-07 | Discharge: 2022-01-07 | Disposition: A | Discharge status: Home / Self Care | Payer: Federal, State, Local not specified - PPO | Source: Ambulatory Visit | Attending: Radiation Oncology | Admitting: Radiation Oncology

## 2022-01-07 DIAGNOSIS — C61 Malignant neoplasm of prostate: Secondary | ICD-10-CM | POA: Diagnosis not present

## 2022-01-07 LAB — RAD ONC ARIA SESSION SUMMARY
Course Elapsed Days: 14
Plan Fractions Treated to Date: 11
Plan Prescribed Dose Per Fraction: 2 Gy
Plan Total Fractions Prescribed: 40
Plan Total Prescribed Dose: 80 Gy
Reference Point Dosage Given to Date: 22 Gy
Reference Point Session Dosage Given: 2 Gy
Session Number: 11

## 2022-01-07 NOTE — Telephone Encounter (Signed)
Requested Prescriptions  ?Pending Prescriptions Disp Refills  ?? pantoprazole (PROTONIX) 20 MG tablet [Pharmacy Med Name: PANTOPRAZOLE SOD DR 20 MG TAB] 90 tablet 1  ?  Sig: TAKE 1 TABLET BY MOUTH EVERY DAY  ?  ? Gastroenterology: Proton Pump Inhibitors Passed - 01/05/2022  8:33 AM  ?  ?  Passed - Valid encounter within last 12 months  ?  Recent Outpatient Visits   ?      ? 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 [facility] [name], [name], MD  ? 2 months ago High prostate specific antigen (PSA)  ? [facility] [name], [name], MD  ? 4 months ago Need for influenza vaccination  ? [facility] [name], [name], MD  ? 4 months ago Tachycardia  ? [facility] [name], [name], MD  ? 6 months ago Gout, unspecified cause, unspecified chronicity, unspecified site  ? [facility] [name], [name], MD  ?  ?  ?Future Appointments   ?        ? In 1 month [name], [name], MD [facility]  ?  ? ?  ?  ?  ? ?

## 2022-01-08 ENCOUNTER — Ambulatory Visit
Admission: RE | Admit: 2022-01-08 | Discharge: 2022-01-08 | Disposition: A | Discharge status: Home / Self Care | Payer: Federal, State, Local not specified - PPO | Source: Ambulatory Visit | Attending: Radiation Oncology | Admitting: Radiation Oncology

## 2022-01-08 ENCOUNTER — Other Ambulatory Visit: Payer: Self-pay

## 2022-01-08 DIAGNOSIS — C61 Malignant neoplasm of prostate: Secondary | ICD-10-CM | POA: Diagnosis not present

## 2022-01-08 LAB — RAD ONC ARIA SESSION SUMMARY
Course Elapsed Days: 15
Plan Fractions Treated to Date: 12
Plan Prescribed Dose Per Fraction: 2 Gy
Plan Total Fractions Prescribed: 40
Plan Total Prescribed Dose: 80 Gy
Reference Point Dosage Given to Date: 24 Gy
Reference Point Session Dosage Given: 2 Gy
Session Number: 12

## 2022-01-09 ENCOUNTER — Ambulatory Visit
Admission: RE | Admit: 2022-01-09 | Discharge: 2022-01-09 | Disposition: A | Discharge status: Home / Self Care | Payer: Federal, State, Local not specified - PPO | Source: Ambulatory Visit | Attending: Radiation Oncology | Admitting: Radiation Oncology

## 2022-01-09 ENCOUNTER — Inpatient Hospital Stay: Payer: Federal, State, Local not specified - PPO

## 2022-01-09 ENCOUNTER — Other Ambulatory Visit: Payer: Self-pay

## 2022-01-09 DIAGNOSIS — C61 Malignant neoplasm of prostate: Secondary | ICD-10-CM | POA: Insufficient documentation

## 2022-01-09 LAB — RAD ONC ARIA SESSION SUMMARY
Course Elapsed Days: 16
Plan Fractions Treated to Date: 13
Plan Prescribed Dose Per Fraction: 2 Gy
Plan Total Fractions Prescribed: 40
Plan Total Prescribed Dose: 80 Gy
Reference Point Dosage Given to Date: 26 Gy
Reference Point Session Dosage Given: 2 Gy
Session Number: 13

## 2022-01-09 LAB — CBC
HCT: 38.6 % — ABNORMAL LOW (ref 39.0–52.0)
Hemoglobin: 12.3 g/dL — ABNORMAL LOW (ref 13.0–17.0)
MCH: 30 pg (ref 26.0–34.0)
MCHC: 31.9 g/dL (ref 30.0–36.0)
MCV: 94.1 fL (ref 80.0–100.0)
Platelets: 171 10*3/uL (ref 150–400)
RBC: 4.1 MIL/uL — ABNORMAL LOW (ref 4.22–5.81)
RDW: 13.6 % (ref 11.5–15.5)
WBC: 4.7 10*3/uL (ref 4.0–10.5)
nRBC: 0 % (ref 0.0–0.2)

## 2022-01-10 ENCOUNTER — Ambulatory Visit
Admission: RE | Admit: 2022-01-10 | Discharge: 2022-01-10 | Disposition: A | Discharge status: Home / Self Care | Payer: Federal, State, Local not specified - PPO | Source: Ambulatory Visit | Attending: Radiation Oncology | Admitting: Radiation Oncology

## 2022-01-10 ENCOUNTER — Other Ambulatory Visit: Payer: Self-pay

## 2022-01-10 DIAGNOSIS — C61 Malignant neoplasm of prostate: Secondary | ICD-10-CM | POA: Diagnosis not present

## 2022-01-10 LAB — RAD ONC ARIA SESSION SUMMARY
Course Elapsed Days: 17
Plan Fractions Treated to Date: 14
Plan Prescribed Dose Per Fraction: 2 Gy
Plan Total Fractions Prescribed: 40
Plan Total Prescribed Dose: 80 Gy
Reference Point Dosage Given to Date: 28 Gy
Reference Point Session Dosage Given: 2 Gy
Session Number: 14

## 2022-01-11 ENCOUNTER — Ambulatory Visit
Admission: RE | Admit: 2022-01-11 | Discharge: 2022-01-11 | Disposition: A | Discharge status: Home / Self Care | Payer: Federal, State, Local not specified - PPO | Source: Ambulatory Visit | Attending: Radiation Oncology | Admitting: Radiation Oncology

## 2022-01-11 ENCOUNTER — Other Ambulatory Visit: Payer: Self-pay

## 2022-01-11 DIAGNOSIS — C61 Malignant neoplasm of prostate: Secondary | ICD-10-CM | POA: Diagnosis not present

## 2022-01-11 LAB — RAD ONC ARIA SESSION SUMMARY
Course Elapsed Days: 18
Plan Fractions Treated to Date: 15
Plan Prescribed Dose Per Fraction: 2 Gy
Plan Total Fractions Prescribed: 40
Plan Total Prescribed Dose: 80 Gy
Reference Point Dosage Given to Date: 30 Gy
Reference Point Session Dosage Given: 2 Gy
Session Number: 15

## 2022-01-14 ENCOUNTER — Other Ambulatory Visit: Payer: Self-pay

## 2022-01-14 ENCOUNTER — Ambulatory Visit
Admission: RE | Admit: 2022-01-14 | Discharge: 2022-01-14 | Disposition: A | Discharge status: Home / Self Care | Payer: Federal, State, Local not specified - PPO | Source: Ambulatory Visit | Attending: Radiation Oncology | Admitting: Radiation Oncology

## 2022-01-14 DIAGNOSIS — C61 Malignant neoplasm of prostate: Secondary | ICD-10-CM | POA: Insufficient documentation

## 2022-01-14 LAB — RAD ONC ARIA SESSION SUMMARY
Course Elapsed Days: 21
Plan Fractions Treated to Date: 16
Plan Prescribed Dose Per Fraction: 2 Gy
Plan Total Fractions Prescribed: 40
Plan Total Prescribed Dose: 80 Gy
Reference Point Dosage Given to Date: 32 Gy
Reference Point Session Dosage Given: 2 Gy
Session Number: 16

## 2022-01-15 ENCOUNTER — Other Ambulatory Visit: Payer: Self-pay

## 2022-01-15 ENCOUNTER — Ambulatory Visit
Admission: RE | Admit: 2022-01-15 | Discharge: 2022-01-15 | Disposition: A | Discharge status: Home / Self Care | Payer: Federal, State, Local not specified - PPO | Source: Ambulatory Visit | Attending: Radiation Oncology | Admitting: Radiation Oncology

## 2022-01-15 DIAGNOSIS — C61 Malignant neoplasm of prostate: Secondary | ICD-10-CM | POA: Diagnosis not present

## 2022-01-15 LAB — RAD ONC ARIA SESSION SUMMARY
Course Elapsed Days: 22
Plan Fractions Treated to Date: 17
Plan Prescribed Dose Per Fraction: 2 Gy
Plan Total Fractions Prescribed: 40
Plan Total Prescribed Dose: 80 Gy
Reference Point Dosage Given to Date: 34 Gy
Reference Point Session Dosage Given: 2 Gy
Session Number: 17

## 2022-01-16 ENCOUNTER — Ambulatory Visit
Admission: RE | Admit: 2022-01-16 | Discharge: 2022-01-16 | Disposition: A | Discharge status: Home / Self Care | Payer: Federal, State, Local not specified - PPO | Source: Ambulatory Visit | Attending: Radiation Oncology | Admitting: Radiation Oncology

## 2022-01-16 ENCOUNTER — Other Ambulatory Visit: Payer: Self-pay

## 2022-01-16 DIAGNOSIS — C61 Malignant neoplasm of prostate: Secondary | ICD-10-CM | POA: Diagnosis not present

## 2022-01-16 LAB — RAD ONC ARIA SESSION SUMMARY
Course Elapsed Days: 23
Plan Fractions Treated to Date: 18
Plan Prescribed Dose Per Fraction: 2 Gy
Plan Total Fractions Prescribed: 40
Plan Total Prescribed Dose: 80 Gy
Reference Point Dosage Given to Date: 36 Gy
Reference Point Session Dosage Given: 2 Gy
Session Number: 18

## 2022-01-17 ENCOUNTER — Other Ambulatory Visit: Payer: Self-pay

## 2022-01-17 ENCOUNTER — Ambulatory Visit: Payer: Federal, State, Local not specified - PPO | Admitting: Physician Assistant

## 2022-01-17 ENCOUNTER — Ambulatory Visit
Admission: RE | Admit: 2022-01-17 | Discharge: 2022-01-17 | Disposition: A | Discharge status: Home / Self Care | Payer: Federal, State, Local not specified - PPO | Source: Ambulatory Visit | Attending: Radiation Oncology | Admitting: Radiation Oncology

## 2022-01-17 DIAGNOSIS — C61 Malignant neoplasm of prostate: Secondary | ICD-10-CM | POA: Diagnosis not present

## 2022-01-17 LAB — RAD ONC ARIA SESSION SUMMARY
Course Elapsed Days: 24
Plan Fractions Treated to Date: 19
Plan Prescribed Dose Per Fraction: 2 Gy
Plan Total Fractions Prescribed: 40
Plan Total Prescribed Dose: 80 Gy
Reference Point Dosage Given to Date: 38 Gy
Reference Point Session Dosage Given: 2 Gy
Session Number: 19

## 2022-01-18 ENCOUNTER — Ambulatory Visit
Admission: RE | Admit: 2022-01-18 | Discharge: 2022-01-18 | Disposition: A | Discharge status: Home / Self Care | Payer: Federal, State, Local not specified - PPO | Source: Ambulatory Visit | Attending: Radiation Oncology | Admitting: Radiation Oncology

## 2022-01-18 ENCOUNTER — Ambulatory Visit: Payer: Federal, State, Local not specified - PPO | Admitting: Physician Assistant

## 2022-01-18 ENCOUNTER — Other Ambulatory Visit: Payer: Self-pay

## 2022-01-18 DIAGNOSIS — C61 Malignant neoplasm of prostate: Secondary | ICD-10-CM | POA: Diagnosis not present

## 2022-01-18 LAB — RAD ONC ARIA SESSION SUMMARY
Course Elapsed Days: 25
Plan Fractions Treated to Date: 20
Plan Prescribed Dose Per Fraction: 2 Gy
Plan Total Fractions Prescribed: 40
Plan Total Prescribed Dose: 80 Gy
Reference Point Dosage Given to Date: 40 Gy
Reference Point Session Dosage Given: 2 Gy
Session Number: 20

## 2022-01-21 ENCOUNTER — Ambulatory Visit
Admission: RE | Admit: 2022-01-21 | Discharge: 2022-01-21 | Disposition: A | Discharge status: Home / Self Care | Payer: Federal, State, Local not specified - PPO | Source: Ambulatory Visit | Attending: Radiation Oncology | Admitting: Radiation Oncology

## 2022-01-21 ENCOUNTER — Other Ambulatory Visit: Payer: Self-pay

## 2022-01-21 DIAGNOSIS — C61 Malignant neoplasm of prostate: Secondary | ICD-10-CM | POA: Diagnosis not present

## 2022-01-21 LAB — RAD ONC ARIA SESSION SUMMARY
Course Elapsed Days: 28
Plan Fractions Treated to Date: 21
Plan Prescribed Dose Per Fraction: 2 Gy
Plan Total Fractions Prescribed: 40
Plan Total Prescribed Dose: 80 Gy
Reference Point Dosage Given to Date: 42 Gy
Reference Point Session Dosage Given: 2 Gy
Session Number: 21

## 2022-01-22 ENCOUNTER — Other Ambulatory Visit: Payer: Self-pay

## 2022-01-22 ENCOUNTER — Ambulatory Visit
Admission: RE | Admit: 2022-01-22 | Discharge: 2022-01-22 | Disposition: A | Discharge status: Home / Self Care | Payer: Federal, State, Local not specified - PPO | Source: Ambulatory Visit | Attending: Radiation Oncology | Admitting: Radiation Oncology

## 2022-01-22 DIAGNOSIS — C61 Malignant neoplasm of prostate: Secondary | ICD-10-CM | POA: Diagnosis not present

## 2022-01-22 LAB — RAD ONC ARIA SESSION SUMMARY
Course Elapsed Days: 29
Plan Fractions Treated to Date: 22
Plan Prescribed Dose Per Fraction: 2 Gy
Plan Total Fractions Prescribed: 40
Plan Total Prescribed Dose: 80 Gy
Reference Point Dosage Given to Date: 44 Gy
Reference Point Session Dosage Given: 2 Gy
Session Number: 22

## 2022-01-23 ENCOUNTER — Other Ambulatory Visit: Payer: Self-pay

## 2022-01-23 ENCOUNTER — Inpatient Hospital Stay: Payer: Federal, State, Local not specified - PPO

## 2022-01-23 ENCOUNTER — Ambulatory Visit
Admission: RE | Admit: 2022-01-23 | Discharge: 2022-01-23 | Disposition: A | Discharge status: Home / Self Care | Payer: Federal, State, Local not specified - PPO | Source: Ambulatory Visit | Attending: Radiation Oncology | Admitting: Radiation Oncology

## 2022-01-23 DIAGNOSIS — C61 Malignant neoplasm of prostate: Secondary | ICD-10-CM

## 2022-01-23 LAB — CBC
HCT: 37.7 % — ABNORMAL LOW (ref 39.0–52.0)
Hemoglobin: 12.1 g/dL — ABNORMAL LOW (ref 13.0–17.0)
MCH: 29.9 pg (ref 26.0–34.0)
MCHC: 32.1 g/dL (ref 30.0–36.0)
MCV: 93.1 fL (ref 80.0–100.0)
Platelets: 185 10*3/uL (ref 150–400)
RBC: 4.05 MIL/uL — ABNORMAL LOW (ref 4.22–5.81)
RDW: 13.9 % (ref 11.5–15.5)
WBC: 4.5 10*3/uL (ref 4.0–10.5)
nRBC: 0 % (ref 0.0–0.2)

## 2022-01-23 LAB — RAD ONC ARIA SESSION SUMMARY
Course Elapsed Days: 30
Plan Fractions Treated to Date: 23
Plan Prescribed Dose Per Fraction: 2 Gy
Plan Total Fractions Prescribed: 40
Plan Total Prescribed Dose: 80 Gy
Reference Point Dosage Given to Date: 46 Gy
Reference Point Session Dosage Given: 2 Gy
Session Number: 23

## 2022-01-24 ENCOUNTER — Ambulatory Visit
Admission: RE | Admit: 2022-01-24 | Discharge: 2022-01-24 | Disposition: A | Discharge status: Home / Self Care | Payer: Federal, State, Local not specified - PPO | Source: Ambulatory Visit | Attending: Radiation Oncology | Admitting: Radiation Oncology

## 2022-01-24 ENCOUNTER — Other Ambulatory Visit: Payer: Self-pay

## 2022-01-24 DIAGNOSIS — C61 Malignant neoplasm of prostate: Secondary | ICD-10-CM | POA: Diagnosis not present

## 2022-01-24 LAB — RAD ONC ARIA SESSION SUMMARY
Course Elapsed Days: 31
Plan Fractions Treated to Date: 24
Plan Prescribed Dose Per Fraction: 2 Gy
Plan Total Fractions Prescribed: 40
Plan Total Prescribed Dose: 80 Gy
Reference Point Dosage Given to Date: 48 Gy
Reference Point Session Dosage Given: 2 Gy
Session Number: 24

## 2022-01-25 ENCOUNTER — Other Ambulatory Visit: Payer: Self-pay

## 2022-01-25 ENCOUNTER — Ambulatory Visit
Admission: RE | Admit: 2022-01-25 | Discharge: 2022-01-25 | Disposition: A | Discharge status: Home / Self Care | Payer: Federal, State, Local not specified - PPO | Source: Ambulatory Visit | Attending: Radiation Oncology | Admitting: Radiation Oncology

## 2022-01-25 DIAGNOSIS — C61 Malignant neoplasm of prostate: Secondary | ICD-10-CM | POA: Diagnosis not present

## 2022-01-25 LAB — RAD ONC ARIA SESSION SUMMARY
Course Elapsed Days: 32
Plan Fractions Treated to Date: 25
Plan Prescribed Dose Per Fraction: 2 Gy
Plan Total Fractions Prescribed: 40
Plan Total Prescribed Dose: 80 Gy
Reference Point Dosage Given to Date: 50 Gy
Reference Point Session Dosage Given: 2 Gy
Session Number: 25

## 2022-01-28 ENCOUNTER — Other Ambulatory Visit: Payer: Self-pay

## 2022-01-28 ENCOUNTER — Ambulatory Visit
Admission: RE | Admit: 2022-01-28 | Discharge: 2022-01-28 | Disposition: A | Discharge status: Home / Self Care | Payer: Federal, State, Local not specified - PPO | Source: Ambulatory Visit | Attending: Radiation Oncology | Admitting: Radiation Oncology

## 2022-01-28 DIAGNOSIS — C61 Malignant neoplasm of prostate: Secondary | ICD-10-CM | POA: Diagnosis not present

## 2022-01-28 LAB — RAD ONC ARIA SESSION SUMMARY
Course Elapsed Days: 35
Plan Fractions Treated to Date: 26
Plan Prescribed Dose Per Fraction: 2 Gy
Plan Total Fractions Prescribed: 40
Plan Total Prescribed Dose: 80 Gy
Reference Point Dosage Given to Date: 52 Gy
Reference Point Session Dosage Given: 2 Gy
Session Number: 26

## 2022-01-29 ENCOUNTER — Ambulatory Visit
Admission: RE | Admit: 2022-01-29 | Discharge: 2022-01-29 | Disposition: A | Discharge status: Home / Self Care | Payer: Federal, State, Local not specified - PPO | Source: Ambulatory Visit | Attending: Radiation Oncology | Admitting: Radiation Oncology

## 2022-01-29 ENCOUNTER — Other Ambulatory Visit: Payer: Self-pay

## 2022-01-29 DIAGNOSIS — C61 Malignant neoplasm of prostate: Secondary | ICD-10-CM | POA: Diagnosis not present

## 2022-01-29 LAB — RAD ONC ARIA SESSION SUMMARY
Course Elapsed Days: 36
Plan Fractions Treated to Date: 27
Plan Prescribed Dose Per Fraction: 2 Gy
Plan Total Fractions Prescribed: 40
Plan Total Prescribed Dose: 80 Gy
Reference Point Dosage Given to Date: 54 Gy
Reference Point Session Dosage Given: 2 Gy
Session Number: 27

## 2022-01-30 ENCOUNTER — Ambulatory Visit
Admission: RE | Admit: 2022-01-30 | Discharge: 2022-01-30 | Disposition: A | Discharge status: Home / Self Care | Payer: Federal, State, Local not specified - PPO | Source: Ambulatory Visit | Attending: Radiation Oncology | Admitting: Radiation Oncology

## 2022-01-30 ENCOUNTER — Other Ambulatory Visit: Payer: Self-pay

## 2022-01-30 DIAGNOSIS — C61 Malignant neoplasm of prostate: Secondary | ICD-10-CM | POA: Diagnosis not present

## 2022-01-30 LAB — RAD ONC ARIA SESSION SUMMARY
Course Elapsed Days: 37
Plan Fractions Treated to Date: 28
Plan Prescribed Dose Per Fraction: 2 Gy
Plan Total Fractions Prescribed: 40
Plan Total Prescribed Dose: 80 Gy
Reference Point Dosage Given to Date: 56 Gy
Reference Point Session Dosage Given: 2 Gy
Session Number: 28

## 2022-01-31 ENCOUNTER — Other Ambulatory Visit: Payer: Self-pay

## 2022-01-31 ENCOUNTER — Ambulatory Visit
Admission: RE | Admit: 2022-01-31 | Discharge: 2022-01-31 | Disposition: A | Discharge status: Home / Self Care | Payer: Federal, State, Local not specified - PPO | Source: Ambulatory Visit | Attending: Radiation Oncology | Admitting: Radiation Oncology

## 2022-01-31 DIAGNOSIS — C61 Malignant neoplasm of prostate: Secondary | ICD-10-CM | POA: Diagnosis not present

## 2022-01-31 LAB — RAD ONC ARIA SESSION SUMMARY
Course Elapsed Days: 38
Plan Fractions Treated to Date: 29
Plan Prescribed Dose Per Fraction: 2 Gy
Plan Total Fractions Prescribed: 40
Plan Total Prescribed Dose: 80 Gy
Reference Point Dosage Given to Date: 58 Gy
Reference Point Session Dosage Given: 2 Gy
Session Number: 29

## 2022-02-01 ENCOUNTER — Ambulatory Visit
Admission: RE | Admit: 2022-02-01 | Discharge: 2022-02-01 | Disposition: A | Discharge status: Home / Self Care | Payer: Federal, State, Local not specified - PPO | Source: Ambulatory Visit | Attending: Radiation Oncology | Admitting: Radiation Oncology

## 2022-02-01 ENCOUNTER — Other Ambulatory Visit: Payer: Self-pay

## 2022-02-01 DIAGNOSIS — C61 Malignant neoplasm of prostate: Secondary | ICD-10-CM | POA: Diagnosis not present

## 2022-02-01 LAB — RAD ONC ARIA SESSION SUMMARY
Course Elapsed Days: 39
Plan Fractions Treated to Date: 30
Plan Prescribed Dose Per Fraction: 2 Gy
Plan Total Fractions Prescribed: 40
Plan Total Prescribed Dose: 80 Gy
Reference Point Dosage Given to Date: 60 Gy
Reference Point Session Dosage Given: 2 Gy
Session Number: 30

## 2022-02-04 ENCOUNTER — Ambulatory Visit
Admission: RE | Admit: 2022-02-04 | Discharge: 2022-02-04 | Disposition: A | Discharge status: Home / Self Care | Payer: Federal, State, Local not specified - PPO | Source: Ambulatory Visit | Attending: Radiation Oncology | Admitting: Radiation Oncology

## 2022-02-04 ENCOUNTER — Other Ambulatory Visit: Payer: Self-pay

## 2022-02-04 DIAGNOSIS — C61 Malignant neoplasm of prostate: Secondary | ICD-10-CM | POA: Diagnosis not present

## 2022-02-04 LAB — RAD ONC ARIA SESSION SUMMARY
Course Elapsed Days: 42
Plan Fractions Treated to Date: 31
Plan Prescribed Dose Per Fraction: 2 Gy
Plan Total Fractions Prescribed: 40
Plan Total Prescribed Dose: 80 Gy
Reference Point Dosage Given to Date: 62 Gy
Reference Point Session Dosage Given: 2 Gy
Session Number: 31

## 2022-02-05 ENCOUNTER — Ambulatory Visit
Admission: RE | Admit: 2022-02-05 | Discharge: 2022-02-05 | Disposition: A | Discharge status: Home / Self Care | Payer: Federal, State, Local not specified - PPO | Source: Ambulatory Visit | Attending: Radiation Oncology | Admitting: Radiation Oncology

## 2022-02-05 ENCOUNTER — Other Ambulatory Visit: Payer: Self-pay

## 2022-02-05 DIAGNOSIS — C61 Malignant neoplasm of prostate: Secondary | ICD-10-CM | POA: Diagnosis not present

## 2022-02-05 LAB — RAD ONC ARIA SESSION SUMMARY
Course Elapsed Days: 43
Plan Fractions Treated to Date: 32
Plan Prescribed Dose Per Fraction: 2 Gy
Plan Total Fractions Prescribed: 40
Plan Total Prescribed Dose: 80 Gy
Reference Point Dosage Given to Date: 64 Gy
Reference Point Session Dosage Given: 2 Gy
Session Number: 32

## 2022-02-06 ENCOUNTER — Other Ambulatory Visit: Payer: Self-pay

## 2022-02-06 ENCOUNTER — Ambulatory Visit
Admission: RE | Admit: 2022-02-06 | Discharge: 2022-02-06 | Disposition: A | Discharge status: Home / Self Care | Payer: Federal, State, Local not specified - PPO | Source: Ambulatory Visit | Attending: Radiation Oncology | Admitting: Radiation Oncology

## 2022-02-06 ENCOUNTER — Inpatient Hospital Stay: Payer: Federal, State, Local not specified - PPO

## 2022-02-06 DIAGNOSIS — C61 Malignant neoplasm of prostate: Secondary | ICD-10-CM | POA: Diagnosis not present

## 2022-02-06 LAB — RAD ONC ARIA SESSION SUMMARY
Course Elapsed Days: 44
Plan Fractions Treated to Date: 33
Plan Prescribed Dose Per Fraction: 2 Gy
Plan Total Fractions Prescribed: 40
Plan Total Prescribed Dose: 80 Gy
Reference Point Dosage Given to Date: 66 Gy
Reference Point Session Dosage Given: 2 Gy
Session Number: 33

## 2022-02-06 LAB — CBC
HCT: 37.2 % — ABNORMAL LOW (ref 39.0–52.0)
Hemoglobin: 11.9 g/dL — ABNORMAL LOW (ref 13.0–17.0)
MCH: 30.2 pg (ref 26.0–34.0)
MCHC: 32 g/dL (ref 30.0–36.0)
MCV: 94.4 fL (ref 80.0–100.0)
Platelets: 203 10*3/uL (ref 150–400)
RBC: 3.94 MIL/uL — ABNORMAL LOW (ref 4.22–5.81)
RDW: 14.4 % (ref 11.5–15.5)
WBC: 5.3 10*3/uL (ref 4.0–10.5)
nRBC: 0 % (ref 0.0–0.2)

## 2022-02-07 ENCOUNTER — Ambulatory Visit
Admission: RE | Admit: 2022-02-07 | Discharge: 2022-02-07 | Disposition: A | Discharge status: Home / Self Care | Payer: Federal, State, Local not specified - PPO | Source: Ambulatory Visit | Attending: Radiation Oncology | Admitting: Radiation Oncology

## 2022-02-07 ENCOUNTER — Other Ambulatory Visit: Payer: Self-pay

## 2022-02-07 DIAGNOSIS — C61 Malignant neoplasm of prostate: Secondary | ICD-10-CM | POA: Diagnosis not present

## 2022-02-07 LAB — RAD ONC ARIA SESSION SUMMARY
Course Elapsed Days: 45
Plan Fractions Treated to Date: 34
Plan Prescribed Dose Per Fraction: 2 Gy
Plan Total Fractions Prescribed: 40
Plan Total Prescribed Dose: 80 Gy
Reference Point Dosage Given to Date: 68 Gy
Reference Point Session Dosage Given: 2 Gy
Session Number: 34

## 2022-02-08 ENCOUNTER — Other Ambulatory Visit: Payer: Self-pay

## 2022-02-08 ENCOUNTER — Ambulatory Visit
Admission: RE | Admit: 2022-02-08 | Discharge: 2022-02-08 | Disposition: A | Discharge status: Home / Self Care | Payer: Federal, State, Local not specified - PPO | Source: Ambulatory Visit | Attending: Radiation Oncology | Admitting: Radiation Oncology

## 2022-02-08 DIAGNOSIS — C61 Malignant neoplasm of prostate: Secondary | ICD-10-CM | POA: Diagnosis not present

## 2022-02-08 LAB — RAD ONC ARIA SESSION SUMMARY
Course Elapsed Days: 46
Plan Fractions Treated to Date: 35
Plan Prescribed Dose Per Fraction: 2 Gy
Plan Total Fractions Prescribed: 40
Plan Total Prescribed Dose: 80 Gy
Reference Point Dosage Given to Date: 70 Gy
Reference Point Session Dosage Given: 2 Gy
Session Number: 35

## 2022-02-12 ENCOUNTER — Other Ambulatory Visit: Payer: Self-pay

## 2022-02-12 ENCOUNTER — Ambulatory Visit
Admission: RE | Admit: 2022-02-12 | Discharge: 2022-02-12 | Disposition: A | Discharge status: Home / Self Care | Payer: Federal, State, Local not specified - PPO | Source: Ambulatory Visit | Attending: Radiation Oncology | Admitting: Radiation Oncology

## 2022-02-12 DIAGNOSIS — C61 Malignant neoplasm of prostate: Secondary | ICD-10-CM | POA: Diagnosis not present

## 2022-02-12 LAB — RAD ONC ARIA SESSION SUMMARY
Course Elapsed Days: 50
Plan Fractions Treated to Date: 36
Plan Prescribed Dose Per Fraction: 2 Gy
Plan Total Fractions Prescribed: 40
Plan Total Prescribed Dose: 80 Gy
Reference Point Dosage Given to Date: 72 Gy
Reference Point Session Dosage Given: 2 Gy
Session Number: 36

## 2022-02-13 ENCOUNTER — Other Ambulatory Visit: Payer: Self-pay

## 2022-02-13 ENCOUNTER — Ambulatory Visit
Admission: RE | Admit: 2022-02-13 | Discharge: 2022-02-13 | Disposition: A | Discharge status: Home / Self Care | Payer: Federal, State, Local not specified - PPO | Source: Ambulatory Visit | Attending: Radiation Oncology | Admitting: Radiation Oncology

## 2022-02-13 ENCOUNTER — Other Ambulatory Visit: Payer: Federal, State, Local not specified - PPO

## 2022-02-13 DIAGNOSIS — M109 Gout, unspecified: Secondary | ICD-10-CM

## 2022-02-13 DIAGNOSIS — C61 Malignant neoplasm of prostate: Secondary | ICD-10-CM | POA: Diagnosis not present

## 2022-02-13 DIAGNOSIS — F0280 Dementia in other diseases classified elsewhere without behavioral disturbance: Secondary | ICD-10-CM

## 2022-02-13 DIAGNOSIS — E785 Hyperlipidemia, unspecified: Secondary | ICD-10-CM

## 2022-02-13 HISTORY — DX: Gout, unspecified: M10.9

## 2022-02-13 LAB — RAD ONC ARIA SESSION SUMMARY
Course Elapsed Days: 51
Plan Fractions Treated to Date: 37
Plan Prescribed Dose Per Fraction: 2 Gy
Plan Total Fractions Prescribed: 40
Plan Total Prescribed Dose: 80 Gy
Reference Point Dosage Given to Date: 74 Gy
Reference Point Session Dosage Given: 2 Gy
Session Number: 37

## 2022-02-14 ENCOUNTER — Ambulatory Visit
Admission: RE | Admit: 2022-02-14 | Discharge: 2022-02-14 | Disposition: A | Discharge status: Home / Self Care | Payer: Federal, State, Local not specified - PPO | Source: Ambulatory Visit | Attending: Radiation Oncology | Admitting: Radiation Oncology

## 2022-02-14 ENCOUNTER — Other Ambulatory Visit: Payer: Self-pay

## 2022-02-14 DIAGNOSIS — C61 Malignant neoplasm of prostate: Secondary | ICD-10-CM | POA: Diagnosis present

## 2022-02-14 LAB — RAD ONC ARIA SESSION SUMMARY
Course Elapsed Days: 52
Plan Fractions Treated to Date: 38
Plan Prescribed Dose Per Fraction: 2 Gy
Plan Total Fractions Prescribed: 40
Plan Total Prescribed Dose: 80 Gy
Reference Point Dosage Given to Date: 76 Gy
Reference Point Session Dosage Given: 2 Gy
Session Number: 38

## 2022-02-14 LAB — LIPID PANEL
Chol/HDL Ratio: 2.5 ratio (ref 0.0–5.0)
Cholesterol, Total: 172 mg/dL (ref 100–199)
HDL: 69 mg/dL (ref >39)
LDL Chol Calc (NIH): 85 mg/dL (ref 0–99)
Triglycerides: 101 mg/dL (ref 0–149)
VLDL Cholesterol Cal: 18 mg/dL (ref 5–40)

## 2022-02-14 LAB — URIC ACID: Uric Acid: 6.1 mg/dL (ref 3.8–8.4)

## 2022-02-15 ENCOUNTER — Ambulatory Visit
Admission: RE | Admit: 2022-02-15 | Discharge: 2022-02-15 | Disposition: A | Discharge status: Home / Self Care | Payer: Federal, State, Local not specified - PPO | Source: Ambulatory Visit | Attending: Radiation Oncology | Admitting: Radiation Oncology

## 2022-02-15 ENCOUNTER — Other Ambulatory Visit: Payer: Self-pay

## 2022-02-15 DIAGNOSIS — C61 Malignant neoplasm of prostate: Secondary | ICD-10-CM | POA: Diagnosis not present

## 2022-02-15 LAB — RAD ONC ARIA SESSION SUMMARY
Course Elapsed Days: 53
Plan Fractions Treated to Date: 39
Plan Prescribed Dose Per Fraction: 2 Gy
Plan Total Fractions Prescribed: 40
Plan Total Prescribed Dose: 80 Gy
Reference Point Dosage Given to Date: 78 Gy
Reference Point Session Dosage Given: 2 Gy
Session Number: 39

## 2022-02-18 ENCOUNTER — Ambulatory Visit: Payer: Federal, State, Local not specified - PPO | Admitting: Physician Assistant

## 2022-02-18 ENCOUNTER — Ambulatory Visit
Admission: RE | Admit: 2022-02-18 | Discharge: 2022-02-18 | Disposition: A | Discharge status: Home / Self Care | Payer: Federal, State, Local not specified - PPO | Source: Ambulatory Visit | Attending: Radiation Oncology | Admitting: Radiation Oncology

## 2022-02-18 ENCOUNTER — Encounter: Payer: Self-pay | Admitting: Physician Assistant

## 2022-02-18 ENCOUNTER — Other Ambulatory Visit: Payer: Self-pay

## 2022-02-18 VITALS — BP 138/75 | HR 71 | Temp 98.0°F | Ht 72.01 in | Wt 202.2 lb

## 2022-02-18 DIAGNOSIS — F0280 Dementia in other diseases classified elsewhere without behavioral disturbance: Secondary | ICD-10-CM

## 2022-02-18 DIAGNOSIS — G309 Alzheimer's disease, unspecified: Secondary | ICD-10-CM

## 2022-02-18 DIAGNOSIS — I10 Essential (primary) hypertension: Secondary | ICD-10-CM

## 2022-02-18 DIAGNOSIS — M109 Gout, unspecified: Secondary | ICD-10-CM

## 2022-02-18 DIAGNOSIS — E785 Hyperlipidemia, unspecified: Secondary | ICD-10-CM | POA: Diagnosis not present

## 2022-02-18 DIAGNOSIS — C61 Malignant neoplasm of prostate: Secondary | ICD-10-CM | POA: Diagnosis not present

## 2022-02-18 LAB — RAD ONC ARIA SESSION SUMMARY
Course Elapsed Days: 56
Plan Fractions Treated to Date: 40
Plan Prescribed Dose Per Fraction: 2 Gy
Plan Total Fractions Prescribed: 40
Plan Total Prescribed Dose: 80 Gy
Reference Point Dosage Given to Date: 80 Gy
Reference Point Session Dosage Given: 2 Gy
Session Number: 40

## 2022-02-18 NOTE — Assessment & Plan Note (Signed)
Chronic, historic condition Appears well managed on Pravastatin 40 mg  Continue current medications Follow up in 6 months for monitoring

## 2022-02-18 NOTE — Progress Notes (Signed)
Established Patient Office Visit  Name: Jason Estrada   MRN: [id number]    DOB: [birth year]   Date:02/18/2022  Today's Provider: Talitha Givens, MHS, PA-C Introduced myself to the patient as a PA-C and provided education on APPs in clinical practice.         Subjective  Chief Complaint  Chief Complaint  Patient presents with   Hyperlipidemia   Benign Prostatic Hypertrophy   Gout   Dementia due to Alzheimer's disease    Hyperlipidemia Pertinent negatives include no chest pain, myalgias or shortness of breath.  Benign Prostatic Hypertrophy    HLD LDL <85  Currently on Pravastatin 40 mg PO QD  Medication concerns: none No concerns for side effects at this time  Diet: low fiber due to radiation treatments  Exercise: Not much due to knee pain    BPH Last radiation treatment today  Reports he is doing well  No concerns at this tiem   Gout Uric acid levels are within normal range.  Pain: none   Normally has pain in his feet but most recent attack was in his hand - over a year ago Taking Allopurinal - no concerns    Dementia Medications: Donepezil  Still seeing Neurology for management    HTN Takes BP readings at home intermittently  Denies high readings at home Currently taking   Patient Active Problem List   Diagnosis Date Noted   Gout 02/13/2022   High prostate specific antigen (PSA) 10/22/2021   Duodenal ulcer, acute    Sinus tachycardia 09/26/2021   Dementia due to Alzheimer's disease 08/15/2021   Essential hypertension 05/28/2021   Hyperlipidemia 12/16/2018   Chronic kidney disease, stage 4 (severe) 12/16/2018   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 No pertinent family history.  Social History   Tobacco Use   Smoking status: Never     Passive exposure: Never   Smokeless tobacco: Never  Substance Use Topics   Alcohol use: Not Currently    Alcohol/week: 7.0 standard drinks    Types: 7 Shots of liquor per week    Comment: 1 mixed drink nightly     Current Outpatient Medications:    allopurinol (ZYLOPRIM) 100 MG tablet, Take 1 tablet (100 mg total) by mouth daily., Disp: 30 tablet, Rfl: 6   baclofen (LIORESAL) 10 MG tablet, TAKE 1 TABLET BY MOUTH EVERY DAY AS NEEDED, Disp: 90 tablet, Rfl: 0   calcitRIOL (ROCALTROL) 0.25 MCG capsule, TAKE 1 CAPSULE BY MOUTH EVERY DAY, Disp: 90 capsule, Rfl: 1   colchicine 0.6 MG tablet, TAKE 1 TABLET BY MOUTH EVERY DAY, Disp: 90 tablet, Rfl: 1   donepezil (ARICEPT) 10 MG tablet, Take 1 tablet at 10 mg daily, Disp: 30 tablet, Rfl: 11   folic acid (FOLVITE) 1 MG tablet, Take 1 tablet (1 mg total) by mouth daily., Disp: 90 tablet, Rfl: 1   hydrALAZINE (APRESOLINE) 50 MG tablet, Take 0.5 tablets (25 mg total) by mouth in the morning and at bedtime., Disp: 30 tablet, Rfl: 3   metoprolol succinate (TOPROL-XL) 25 MG 24 hr tablet, Take 25 mg by mouth daily., Disp: , Rfl:    pantoprazole (PROTONIX) 20 MG tablet, TAKE 1 TABLET BY MOUTH EVERY DAY, Disp: 90 tablet, Rfl: 1   pravastatin (PRAVACHOL)  40 MG tablet, Take 1 tablet (40 mg total) by mouth daily., Disp: 30 tablet, Rfl: 4   verapamil (CALAN-SR) 240 MG CR tablet, TAKE 1 TABLET BY MOUTH EVERY DAY, Disp: 90 tablet, Rfl: 0  No Known Allergies  I personally reviewed active problem list, medication list, allergies with the patient/caregiver today.   Review of Systems  Constitutional:  Positive for malaise/fatigue.  Eyes:  Negative for blurred vision and double vision.  Respiratory:  Negative for shortness of breath and wheezing.   Cardiovascular:  Negative for chest pain, palpitations and leg swelling.  Musculoskeletal:  Negative for back pain, falls, joint pain and myalgias.  Neurological:  Negative for dizziness, tingling, tremors and  headaches.  Psychiatric/Behavioral:  Negative for depression. The patient is not nervous/anxious and does not have insomnia.      Objective  Vitals:   02/18/22 1343  BP: 138/75  Pulse: 71  Temp: 98 F (36.7 C)  TempSrc: Oral  SpO2: 98%  Weight: 202 lb 3.2 oz (91.7 kg)  Height: 6' 0.01" (1.829 m)    Body mass index is 27.42 kg/m.  Physical Exam Vitals reviewed.  Constitutional:      General: He is awake.     Appearance: Normal appearance. He is well-developed, well-groomed and normal weight.  HENT:     Head: Normocephalic and atraumatic.  Eyes:     General: Lids are normal.     Pupils: Pupils are equal, round, and reactive to light.  Cardiovascular:     Rate and Rhythm: Normal rate and regular rhythm.     Pulses: Normal pulses.          Radial pulses are 2+ on the right side and 2+ on the left side.     Heart sounds: Normal heart sounds.  Pulmonary:     Effort: Pulmonary effort is normal.     Breath sounds: Normal air entry. No decreased breath sounds, wheezing, rhonchi or rales.  Musculoskeletal:     Right lower leg: No edema.     Left lower leg: No edema.  Neurological:     Mental Status: He is alert.     GCS: GCS eye subscore is 4. GCS verbal subscore is 5. GCS motor subscore is 6.  Psychiatric:        Attention and Perception: Attention normal.        Mood and Affect: Mood and affect normal.        Speech: Speech normal.        Behavior: Behavior normal. Behavior is cooperative.     Recent Results (from the past 2160 hour(s))  [facility] Session Summary     Status: None   Collection Time: 01/01/22  3:37 PM  Result Value Ref Range   Course ID [email or web address]    Course Intent Unknown    Course Start Date 12/12/2021  1:57 PM    Session Number 7    Course First Treatment Date 12/24/2021  3:27 PM    Course Last Treatment Date 01/01/2022  3:35 PM    Course Elapsed Days 8    Reference Point ID Prostate DP    Reference Point Dosage Given to Date 14 Gy    Reference Point Session Dosage Given 2 Gy   Plan ID Prostate    Plan Fractions Treated to Date 7    Plan Total Fractions Prescribed 40    Plan Prescribed Dose Per Fraction 2 Gy   Plan Total Prescribed Dose 80.000000 Gy   Plan  Primary Reference Point Prostate DP   Rad [facility] Session Summary     Status: None   Collection Time: 01/02/22  3:36 PM  Result Value Ref Range   Course ID [email or web address]    Course Intent Unknown    Course Start Date 12/12/2021  1:57 PM    Session Number 8    Course First Treatment Date 12/24/2021  3:27 PM    Course Last Treatment Date 01/02/2022  3:34 PM    Course Elapsed Days 9    Reference Point ID Prostate DP    Reference Point Dosage Given to Date 16 Gy   Reference Point Session Dosage Given 2 Gy   Plan ID Prostate    Plan Fractions Treated to Date 8    Plan Total Fractions Prescribed 40    Plan Prescribed Dose Per Fraction 2 Gy   Plan Total Prescribed Dose 80.000000 Gy   Plan Primary Reference Point Prostate DP   Rad [facility] Session Summary     Status: None   Collection Time: 01/03/22  3:29 PM  Result Value Ref Range   Course ID [email or web address]    Course Intent Unknown    Course Start Date 12/12/2021  1:57 PM    Session Number 9    Course First Treatment Date 12/24/2021  3:27 PM    Course Last Treatment Date 01/03/2022  3:27 PM    Course Elapsed Days 10    Reference Point ID Prostate DP    Reference Point Dosage Given to Date 18 Gy   Reference Point Session Dosage Given 2 Gy   Plan ID Prostate    Plan Fractions Treated to Date 9    Plan Total Fractions Prescribed 40    Plan Prescribed Dose Per Fraction 2 Gy   Plan Total Prescribed Dose 80.000000 Gy   Plan Primary Reference Point Prostate DP   Rad [facility] Session Summary     Status: None   Collection Time: 01/04/22  1:49 PM  Result Value Ref Range   Course ID [email or web address]    Course Intent Unknown    Course Start Date 12/12/2021  1:57 PM    Session Number 10    Course First Treatment Date 12/24/2021   3:27 PM    Course Last Treatment Date 01/04/2022  1:47 PM    Course Elapsed Days 11    Reference Point ID Prostate DP    Reference Point Dosage Given to Date 20 Gy   Reference Point Session Dosage Given 2 Gy   Plan ID Prostate    Plan Fractions Treated to Date 10    Plan Total Fractions Prescribed 40    Plan Prescribed Dose Per Fraction 2 Gy   Plan Total Prescribed Dose 80.000000 Gy   Plan Primary Reference Point Prostate DP   Rad [facility] Session Summary     Status: None   Collection Time: 01/07/22  3:21 PM  Result Value Ref Range   Course ID [email or web address]    Course Intent Unknown    Course Start Date 12/12/2021  1:57 PM    Session Number 11    Course First Treatment Date 12/24/2021  3:27 PM    Course Last Treatment Date 01/07/2022  3:19 PM    Course Elapsed Days 14    Reference Point ID Prostate DP    Reference Point Dosage Given to Date 22 Gy   Reference Point Session Dosage Given 2 Gy   Plan ID Prostate    Plan Fractions  Treated to Date 11    Plan Total Fractions Prescribed 40    Plan Prescribed Dose Per Fraction 2 Gy   Plan Total Prescribed Dose 80.000000 Gy   Plan Primary Reference Point Prostate DP   Rad [facility] Session Summary     Status: None   Collection Time: 01/08/22  3:43 PM  Result Value Ref Range   Course ID [email or web address]    Course Intent Unknown    Course Start Date 12/12/2021  1:57 PM    Session Number 12    Course First Treatment Date 12/24/2021  3:27 PM    Course Last Treatment Date 01/08/2022  3:41 PM    Course Elapsed Days 15    Reference Point ID Prostate DP    Reference Point Dosage Given to Date 24 Gy   Reference Point Session Dosage Given 2 Gy   Plan ID Prostate    Plan Fractions Treated to Date 12    Plan Total Fractions Prescribed 40    Plan Prescribed Dose Per Fraction 2 Gy   Plan Total Prescribed Dose 80.000000 Gy   Plan Primary Reference Point Prostate DP   CBC     Status: Abnormal   Collection Time: 01/09/22  3:10 PM  Result Value Ref Range    WBC 4.7 4.0 - 10.5 K/uL   RBC 4.10 (L) 4.22 - 5.81 MIL/uL   Hemoglobin 12.3 (L) 13.0 - 17.0 g/dL   HCT 38.6 (L) 39.0 - 52.0 %   MCV 94.1 80.0 - 100.0 fL   MCH 30.0 26.0 - 34.0 pg   MCHC 31.9 30.0 - 36.0 g/dL   RDW 13.6 11.5 - 15.5 %   Platelets 171 150 - 400 K/uL   nRBC 0.0 0.0 - 0.2 %    Comment: Performed at [facility], [address] [place], [place] [address]  [facility] Session Summary     Status: None   Collection Time: 01/09/22  3:40 PM  Result Value Ref Range   Course ID [email or web address]    Course Intent Unknown    Course Start Date 12/12/2021  1:57 PM    Session Number 13    Course First Treatment Date 12/24/2021  3:27 PM    Course Last Treatment Date 01/09/2022  3:37 PM    Course Elapsed Days 16    Reference Point ID Prostate DP    Reference Point Dosage Given to Date 26 Gy   Reference Point Session Dosage Given 2 Gy   Plan ID Prostate    Plan Fractions Treated to Date 13    Plan Total Fractions Prescribed 40    Plan Prescribed Dose Per Fraction 2 Gy   Plan Total Prescribed Dose 80.000000 Gy   Plan Primary Reference Point Prostate DP   Rad Onc [facility] Session Summary     Status: None   Collection Time: 01/10/22  3:34 PM  Result Value Ref Range   Course ID [email or web address]    Course Intent Unknown    Course Start Date 12/12/2021  1:57 PM    Session Number 14    Course First Treatment Date 12/24/2021  3:27 PM    Course Last Treatment Date 01/10/2022  3:32 PM    Course Elapsed Days 17    Reference Point ID Prostate DP    Reference Point Dosage Given to Date 28 Gy   Reference Point Session Dosage Given 2 Gy   Plan ID Prostate    Plan Fractions Treated to Date 86  Plan Total Fractions Prescribed 40    Plan Prescribed Dose Per Fraction 2 Gy   Plan Total Prescribed Dose 80.000000 Gy   Plan Primary Reference Point Prostate DP   Rad Onc [facility] Session Summary     Status: None   Collection Time: 01/11/22  1:50 PM  Result Value Ref Range   Course ID [email or web address]    Course  Intent Unknown    Course Start Date 12/12/2021  1:57 PM    Session Number 15    Course First Treatment Date 12/24/2021  3:27 PM    Course Last Treatment Date 01/11/2022  1:48 PM    Course Elapsed Days 18    Reference Point ID Prostate DP    Reference Point Dosage Given to Date 30 Gy   Reference Point Session Dosage Given 2 Gy   Plan ID Prostate    Plan Fractions Treated to Date 15    Plan Total Fractions Prescribed 40    Plan Prescribed Dose Per Fraction 2 Gy   Plan Total Prescribed Dose 80.000000 Gy   Plan Primary Reference Point Prostate DP   Rad Onc [facility] Session Summary     Status: None   Collection Time: 01/14/22  3:36 PM  Result Value Ref Range   Course ID [email or web address]    Course Intent Unknown    Course Start Date 12/12/2021  1:57 PM    Session Number 16    Course First Treatment Date 12/24/2021  3:27 PM    Course Last Treatment Date 01/14/2022  3:33 PM    Course Elapsed Days 21    Reference Point ID Prostate DP    Reference Point Dosage Given to Date 32 Gy   Reference Point Session Dosage Given 2 Gy   Plan ID Prostate    Plan Fractions Treated to Date 16    Plan Total Fractions Prescribed 40    Plan Prescribed Dose Per Fraction 2 Gy   Plan Total Prescribed Dose 80.000000 Gy   Plan Primary Reference Point Prostate DP   Rad Onc [facility] Session Summary     Status: None   Collection Time: 01/15/22  3:37 PM  Result Value Ref Range   Course ID [email or web address]    Course Intent Unknown    Course Start Date 12/12/2021  1:57 PM    Session Number 14    Course First Treatment Date 12/24/2021  3:27 PM    Course Last Treatment Date 01/15/2022  3:35 PM    Course Elapsed Days 22    Reference Point ID Prostate DP    Reference Point Dosage Given to Date 34 Gy   Reference Point Session Dosage Given 2 Gy   Plan ID Prostate    Plan Fractions Treated to Date 17    Plan Total Fractions Prescribed 40    Plan Prescribed Dose Per Fraction 2 Gy   Plan Total Prescribed Dose 80.000000 Gy   Plan Primary  Reference Point Prostate DP   Rad Onc [facility] Session Summary     Status: None   Collection Time: 01/16/22  3:47 PM  Result Value Ref Range   Course ID [email or web address]    Course Intent Unknown    Course Start Date 12/12/2021  1:57 PM    Session Number 18    Course First Treatment Date 12/24/2021  3:27 PM    Course Last Treatment Date 01/16/2022  3:45 PM    Course Elapsed Days 23    Reference Point ID Prostate DP  Reference Point Dosage Given to Date 36 Gy   Reference Point Session Dosage Given 2 Gy   Plan ID Prostate    Plan Fractions Treated to Date 18    Plan Total Fractions Prescribed 40    Plan Prescribed Dose Per Fraction 2 Gy   Plan Total Prescribed Dose 80.000000 Gy   Plan Primary Reference Point Prostate DP   Rad Onc [facility] Session Summary     Status: None   Collection Time: 01/17/22  2:17 PM  Result Value Ref Range   Course ID [email or web address]    Course Intent Unknown    Course Start Date 12/12/2021  1:57 PM    Session Number 44    Course First Treatment Date 12/24/2021  3:27 PM    Course Last Treatment Date 01/17/2022  2:15 PM    Course Elapsed Days 24    Reference Point ID Prostate DP    Reference Point Dosage Given to Date 99 Gy   Reference Point Session Dosage Given 2 Gy   Plan ID Prostate    Plan Fractions Treated to Date 19    Plan Total Fractions Prescribed 40    Plan Prescribed Dose Per Fraction 2 Gy   Plan Total Prescribed Dose 80.000000 Gy   Plan Primary Reference Point Prostate DP   Rad Onc [facility] Session Summary     Status: None   Collection Time: 01/18/22  2:03 PM  Result Value Ref Range   Course ID [email or web address]    Course Intent Unknown    Course Start Date 12/12/2021  1:57 PM    Session Number 20    Course First Treatment Date 12/24/2021  3:27 PM    Course Last Treatment Date 01/18/2022  2:01 PM    Course Elapsed Days 25    Reference Point ID Prostate DP    Reference Point Dosage Given to Date 40 Gy   Reference Point Session Dosage Given 2 Gy   Plan ID Prostate     Plan Fractions Treated to Date 20    Plan Total Fractions Prescribed 40    Plan Prescribed Dose Per Fraction 2 Gy   Plan Total Prescribed Dose 80.000000 Gy   Plan Primary Reference Point Prostate DP   Rad [facility]c [facility] Session Summary     Status: None   Collection Time: 01/21/22  3:30 PM  Result Value Ref Range   Course ID [email or web address]    Course Intent Unknown    Course Start Date 12/12/2021  1:57 PM    Session Number 21    Course First Treatment Date 12/24/2021  3:27 PM    Course Last Treatment Date 01/21/2022  3:28 PM    Course Elapsed Days 28    Reference Point ID Prostate DP    Reference Point Dosage Given to Date 42 Gy   Reference Point Session Dosage Given 2 Gy   Plan ID Prostate    Plan Fractions Treated to Date 21    Plan Total Fractions Prescribed 40    Plan Prescribed Dose Per Fraction 2 Gy   Plan Total Prescribed Dose 80.000000 Gy   Plan Primary Reference Point Prostate DP   Rad Onc [facility] Session Summary     Status: None   Collection Time: 01/22/22  1:33 PM  Result Value Ref Range   Course ID [email or web address]    Course Intent Unknown    Course Start Date 12/12/2021  1:57 PM    Session Number 59    Course First Treatment Date  12/24/2021  3:27 PM    Course Last Treatment Date 01/22/2022  1:31 PM    Course Elapsed Days 29    Reference Point ID Prostate DP    Reference Point Dosage Given to Date 29 Gy   Reference Point Session Dosage Given 2 Gy   Plan ID Prostate    Plan Fractions Treated to Date 22    Plan Total Fractions Prescribed 40    Plan Prescribed Dose Per Fraction 2 Gy   Plan Total Prescribed Dose 80.000000 Gy   Plan Primary Reference Point Prostate DP   CBC     Status: Abnormal   Collection Time: 01/23/22  3:14 PM  Result Value Ref Range   WBC 4.5 4.0 - 10.5 K/uL   RBC 4.05 (L) 4.22 - 5.81 MIL/uL   Hemoglobin 12.1 (L) 13.0 - 17.0 g/dL   HCT 37.7 (L) 39.0 - 52.0 %   MCV 93.1 80.0 - 100.0 fL   MCH 29.9 26.0 - 34.0 pg   MCHC 32.1 30.0 - 36.0 g/dL   RDW 13.9 11.5 - 15.5  %   Platelets 185 150 - 400 K/uL   nRBC 0.0 0.0 - 0.2 %    Comment: Performed at [facility], [address] [place], [place] [address]  Rad [facility] Session Summary     Status: None   Collection Time: 01/23/22  3:38 PM  Result Value Ref Range   Course ID [email or web address]    Course Intent Unknown    Course Start Date 12/12/2021  1:57 PM    Session Number 23    Course First Treatment Date 12/24/2021  3:27 PM    Course Last Treatment Date 01/23/2022  3:36 PM    Course Elapsed Days 30    Reference Point ID Prostate DP    Reference Point Dosage Given to Date 30 Gy   Reference Point Session Dosage Given 2 Gy   Plan ID Prostate    Plan Fractions Treated to Date 23    Plan Total Fractions Prescribed 40    Plan Prescribed Dose Per Fraction 2 Gy   Plan Total Prescribed Dose 80.000000 Gy   Plan Primary Reference Point Prostate DP   Rad [facility]c [facility] Session Summary     Status: None   Collection Time: 01/24/22  3:28 PM  Result Value Ref Range   Course ID [email or web address]    Course Intent Unknown    Course Start Date 12/12/2021  1:57 PM    Session Number 24    Course First Treatment Date 12/24/2021  3:27 PM    Course Last Treatment Date 01/24/2022  3:26 PM    Course Elapsed Days 31    Reference Point ID Prostate DP    Reference Point Dosage Given to Date 67 Gy   Reference Point Session Dosage Given 2 Gy   Plan ID Prostate    Plan Fractions Treated to Date 24    Plan Total Fractions Prescribed 40    Plan Prescribed Dose Per Fraction 2 Gy   Plan Total Prescribed Dose 80.000000 Gy   Plan Primary Reference Point Prostate DP   Rad Onc [facility] Session Summary     Status: None   Collection Time: 01/25/22  1:41 PM  Result Value Ref Range   Course ID [email or web address]    Course Intent Unknown    Course Start Date 12/12/2021  1:57 PM    Session Number 25    Course First Treatment Date 12/24/2021  3:27 PM  Course Last Treatment Date 01/25/2022  1:39 PM    Course Elapsed Days 32    Reference Point ID Prostate  DP    Reference Point Dosage Given to Date 93 Gy   Reference Point Session Dosage Given 2 Gy   Plan ID Prostate    Plan Fractions Treated to Date 25    Plan Total Fractions Prescribed 40    Plan Prescribed Dose Per Fraction 2 Gy   Plan Total Prescribed Dose 80.000000 Gy   Plan Primary Reference Point Prostate DP   Rad Onc [facility] Session Summary     Status: None   Collection Time: 01/28/22  3:39 PM  Result Value Ref Range   Course ID [email or web address]    Course Intent Unknown    Course Start Date 12/12/2021  1:57 PM    Session Number 26    Course First Treatment Date 12/24/2021  3:27 PM    Course Last Treatment Date 01/28/2022  3:37 PM    Course Elapsed Days 35    Reference Point ID Prostate DP    Reference Point Dosage Given to Date 4 Gy   Reference Point Session Dosage Given 2 Gy   Plan ID Prostate    Plan Fractions Treated to Date 26    Plan Total Fractions Prescribed 40    Plan Prescribed Dose Per Fraction 2 Gy   Plan Total Prescribed Dose 80.000000 Gy   Plan Primary Reference Point Prostate DP   Rad Onc [facility] Session Summary     Status: None   Collection Time: 01/29/22  3:32 PM  Result Value Ref Range   Course ID [email or web address]    Course Intent Unknown    Course Start Date 12/12/2021  1:57 PM    Session Number 33    Course First Treatment Date 12/24/2021  3:27 PM    Course Last Treatment Date 01/29/2022  3:30 PM    Course Elapsed Days 36    Reference Point ID Prostate DP    Reference Point Dosage Given to Date 61 Gy   Reference Point Session Dosage Given 2 Gy   Plan ID Prostate    Plan Fractions Treated to Date 27    Plan Total Fractions Prescribed 40    Plan Prescribed Dose Per Fraction 2 Gy   Plan Total Prescribed Dose 80.000000 Gy   Plan Primary Reference Point Prostate DP   Rad [facility] Session Summary     Status: None   Collection Time: 01/30/22  3:33 PM  Result Value Ref Range   Course ID [email or web address]    Course Intent Unknown    Course Start Date 12/12/2021  1:57 PM     Session Number 28    Course First Treatment Date 12/24/2021  3:27 PM    Course Last Treatment Date 01/30/2022  3:31 PM    Course Elapsed Days 37    Reference Point ID Prostate DP    Reference Point Dosage Given to Date 60 Gy   Reference Point Session Dosage Given 2 Gy   Plan ID Prostate    Plan Fractions Treated to Date 28    Plan Total Fractions Prescribed 40    Plan Prescribed Dose Per Fraction 2 Gy   Plan Total Prescribed Dose 80.000000 Gy   Plan Primary Reference Point Prostate DP   Rad Onc [facility] Session Summary     Status: None   Collection Time: 01/31/22  3:35 PM  Result Value Ref Range   Course ID [email or web address]  Course Intent Unknown    Course Start Date 12/12/2021  1:57 PM    Session Number 10    Course First Treatment Date 12/24/2021  3:27 PM    Course Last Treatment Date 01/31/2022  3:33 PM    Course Elapsed Days 38    Reference Point ID Prostate DP    Reference Point Dosage Given to Date 85 Gy   Reference Point Session Dosage Given 2 Gy   Plan ID Prostate    Plan Fractions Treated to Date 29    Plan Total Fractions Prescribed 40    Plan Prescribed Dose Per Fraction 2 Gy   Plan Total Prescribed Dose 80.000000 Gy   Plan Primary Reference Point Prostate DP   Rad Onc [facility] Session Summary     Status: None   Collection Time: 02/01/22  1:31 PM  Result Value Ref Range   Course ID [email or web address]    Course Intent Unknown    Course Start Date 12/12/2021  1:57 PM    Session Number 80    Course First Treatment Date 12/24/2021  3:27 PM    Course Last Treatment Date 02/01/2022  1:29 PM    Course Elapsed Days 39    Reference Point ID Prostate DP    Reference Point Dosage Given to Date 60 Gy   Reference Point Session Dosage Given 2 Gy   Plan ID Prostate    Plan Fractions Treated to Date 30    Plan Total Fractions Prescribed 40    Plan Prescribed Dose Per Fraction 2 Gy   Plan Total Prescribed Dose 80.000000 Gy   Plan Primary Reference Point Prostate DP   Rad Onc [facility] Session Summary      Status: None   Collection Time: 02/04/22  3:24 PM  Result Value Ref Range   Course ID [email or web address]    Course Intent Unknown    Course Start Date 12/12/2021  1:57 PM    Session Number 45    Course First Treatment Date 12/24/2021  3:27 PM    Course Last Treatment Date 02/04/2022  3:21 PM    Course Elapsed Days 42    Reference Point ID Prostate DP    Reference Point Dosage Given to Date 31 Gy   Reference Point Session Dosage Given 2 Gy   Plan ID Prostate    Plan Fractions Treated to Date 51    Plan Total Fractions Prescribed 40    Plan Prescribed Dose Per Fraction 2 Gy   Plan Total Prescribed Dose 80.000000 Gy   Plan Primary Reference Point Prostate DP   Rad Onc [facility] Session Summary     Status: None   Collection Time: 02/05/22  3:23 PM  Result Value Ref Range   Course ID [email or web address]    Course Intent Unknown    Course Start Date 12/12/2021  1:57 PM    Session Number 50    Course First Treatment Date 12/24/2021  3:27 PM    Course Last Treatment Date 02/05/2022  3:21 PM    Course Elapsed Days 43    Reference Point ID Prostate DP    Reference Point Dosage Given to Date 77 Gy   Reference Point Session Dosage Given 2 Gy   Plan ID Prostate    Plan Fractions Treated to Date 69    Plan Total Fractions Prescribed 40    Plan Prescribed Dose Per Fraction 2 Gy   Plan Total Prescribed Dose 80.000000 Gy   Plan Primary Reference [facility] Prostate DP  CBC     Status: Abnormal   Collection Time: 02/06/22  3:12 PM  Result Value Ref Range   WBC 5.3 4.0 - 10.5 K/uL   RBC 3.94 (L) 4.22 - 5.81 MIL/uL   Hemoglobin 11.9 (L) 13.0 - 17.0 g/dL   HCT 37.2 (L) 39.0 - 52.0 %   MCV 94.4 80.0 - 100.0 fL   MCH 30.2 26.0 - 34.0 pg   MCHC 32.0 30.0 - 36.0 g/dL   RDW 14.4 11.5 - 15.5 %   Platelets 203 150 - 400 K/uL   nRBC 0.0 0.0 - 0.2 %    Comment: Performed at [facility], [address] [place], [place] [address]  Rad Sandria Senter Session Summary     Status: None   Collection Time: 02/06/22  3:34 PM   Result Value Ref Range   Course ID [email or web address]    Course Intent Unknown    Course Start Date 12/12/2021  1:57 PM    Session Number 38    Course First Treatment Date 12/24/2021  3:27 PM    Course Last Treatment Date 02/06/2022  3:32 PM    Course Elapsed Days 44    Reference Point ID Prostate DP    [facility] Dosage Given to Date 18 Gy   Reference [facility] Session Dosage Given 2 Gy   Plan ID Prostate    Plan Fractions Treated to Date 69    Plan Total Fractions Prescribed 40    Plan Prescribed Dose Per Fraction 2 Gy   Plan Total Prescribed Dose 80.000000 Gy   Plan Primary Reference [facility] Prostate DP   Rad Onc Aria Session Summary     Status: None   Collection Time: 02/07/22  3:26 PM  Result Value Ref Range   Course ID [email or web address]    Course Intent Unknown    Course Start Date 12/12/2021  1:57 PM    Session Number 60    Course First Treatment Date 12/24/2021  3:27 PM    Course Last Treatment Date 02/07/2022  3:24 PM    Course Elapsed Days 45    Reference Point ID Prostate DP    Reference [facility] Dosage Given to Date 47 Gy   Reference [facility] Session Dosage Given 2 Gy   Plan ID Prostate    Plan Fractions Treated to Date 66    Plan Total Fractions Prescribed 40    Plan Prescribed Dose Per Fraction 2 Gy   Plan Total Prescribed Dose 80.000000 Gy   Plan Primary Reference [facility] Prostate DP   Rad Onc Aria Session Summary     Status: None   Collection Time: 02/08/22  1:21 PM  Result Value Ref Range   Course ID [email or web address]    Course Intent Unknown    Course Start Date 12/12/2021  1:57 PM    Session Number 34    Course First Treatment Date 12/24/2021  3:27 PM    Course Last Treatment Date 02/08/2022  1:19 PM    Course Elapsed Days 46    Reference Point ID Prostate DP    [facility] [place] Dosage Given to Date 70 Gy   Reference [facility] Session Dosage Given 2 Gy   Plan ID Prostate    Plan Fractions Treated to Date 65    Plan Total Fractions Prescribed 40    Plan Prescribed Dose Per Fraction 2 Gy    Plan Total Prescribed Dose 80.000000 Gy   Plan Primary Reference [facility] Prostate DP   Rad Onc Aria Session  Summary     Status: None   Collection Time: 02/12/22  3:34 PM  Result Value Ref Range   Course ID [email or web address]    Course Intent Unknown    Course Start Date 12/12/2021  1:57 PM    Session Number 47    Course First Treatment Date 12/24/2021  3:27 PM    Course Last Treatment Date 02/12/2022  3:32 PM    Course Elapsed Days 50    Reference Point ID Prostate DP    [facility] Dosage Given to Date 8 Gy   Reference [place] Session Dosage Given 2 Gy   Plan ID Prostate    Plan Fractions Treated to Date 36    Plan Total Fractions Prescribed 40    Plan Prescribed Dose Per Fraction 2 Gy   Plan Total Prescribed Dose 80.000000 Gy   Plan Primary Reference [facility] Prostate DP   Uric acid     Status: None   Collection Time: 02/13/22  1:40 PM  Result Value Ref Range   Uric Acid 6.1 3.8 - 8.4 mg/dL    Comment:            Therapeutic target for gout patients: <6.0  Lipid panel     Status: None   Collection Time: 02/13/22  1:40 PM  Result Value Ref Range   Cholesterol, Total 172 100 - 199 mg/dL   Triglycerides 101 0 - 149 mg/dL   HDL 69 >39 mg/dL   VLDL Cholesterol Cal 18 5 - 40 mg/dL   LDL Chol Calc (NIH) 85 0 - 99 mg/dL   Chol/HDL Ratio 2.5 0.0 - 5.0 ratio    Comment:                                   T. Chol/HDL Ratio                                             Men  Women                               1/2 Avg.Risk  3.4    3.3                                   Avg.Risk  5.0    4.4                                2X Avg.Risk  9.6    7.1                                3X Avg.Risk 23.4   11.0   Rad Onc Aria Session Summary     Status: None   Collection Time: 02/13/22  3:21 PM  Result Value Ref Range   Course ID [email or web address]    Course Intent Unknown    Course Start Date 12/12/2021  1:57 PM    Session Number 18    Course First Treatment Date 12/24/2021  3:27 PM    Course Last Treatment Date  02/13/2022  3:19 PM  Course Elapsed Days 51    Reference [facility] ID Prostate DP    Reference [facility] Dosage Given to Date 45 Gy   Reference [facility] Session Dosage Given 2 Gy   Plan ID Prostate    Plan Fractions Treated to Date 37    Plan Total Fractions Prescribed 40    Plan Prescribed Dose Per Fraction 2 Gy   Plan Total Prescribed Dose 80.000000 Gy   Plan Primary Reference [place] Prostate DP   Rad Onc Aria Session Summary     Status: None   Collection Time: 02/14/22  3:28 PM  Result Value Ref Range   Course ID [email or web address]    Course Intent Unknown    Course Start Date 12/12/2021  1:57 PM    Session Number 32    Course First Treatment Date 12/24/2021  3:27 PM    Course Last Treatment Date 02/14/2022  3:26 PM    Course Elapsed Days 52    Reference [facility] ID Prostate DP    [facility] Dosage Given to Date 29 Gy   Reference [facility] Session Dosage Given 2 Gy   Plan ID Prostate    Plan Fractions Treated to Date 30    Plan Total Fractions Prescribed 40    Plan Prescribed Dose Per Fraction 2 Gy   Plan Total Prescribed Dose 80.000000 Gy   Plan Primary [facility] [place] Prostate DP   Rad Onc Aria Session Summary     Status: None   Collection Time: 02/15/22  1:30 PM  Result Value Ref Range   Course ID [email or web address]    Course Intent Unknown    Course Start Date 12/12/2021  1:57 PM    Session Number 55    Course First Treatment Date 12/24/2021  3:27 PM    Course Last Treatment Date 02/15/2022  1:27 PM    Course Elapsed Days 53    Reference Point ID Prostate DP    Reference [facility] Dosage Given to Date 61 Gy   Reference [facility] Session Dosage Given 2 Gy   Plan ID Prostate    Plan Fractions Treated to Date 73    Plan Total Fractions Prescribed 40    Plan Prescribed Dose Per Fraction 2 Gy   Plan Total Prescribed Dose 80.000000 Gy   Plan Primary Reference [facility] Prostate DP      PHQ2/9:    02/18/2022    1:58 PM 12/05/2021   10:58 AM 10/22/2021   11:00 AM 08/27/2021   10:59 AM 08/13/2021   10:21 AM   Depression screen PHQ 2/9  Decreased Interest 0 0 0 0 0  Down, Depressed, Hopeless 0 0 0 0 0  PHQ - 2 Score 0 0 0 0 0  Altered sleeping 0 0 0 0 0  Tired, decreased energy 1 0 0 0 0  Change in appetite 0 0 0 0 0  Feeling bad or failure about yourself  0 0 0 0 0  Trouble concentrating 0 0 0 0 0  Moving slowly or fidgety/restless 0 0 0 0 0  Suicidal thoughts 0 0 0 0 0  PHQ-9 Score 1 0 0 0 0  Difficult doing work/chores Not difficult at all Not difficult at all  Not difficult at all       Fall Risk:    12/05/2021   10:58 AM 11/13/2021   10:58 AM 10/22/2021   11:00 AM 08/27/2021   10:59 AM 08/15/2021   11:24 AM  Fall Risk   Falls in the past year?  0 0 1 1 0  Number falls in past yr: 0 0 1 1 0  Injury with Fall? 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  Continue current medications Follow up in 6 months for monitoring          Return in about 6 months (around 08/20/2022) for  HLD, gout, dementia follow up.   I, Davyn Elsasser E Claudette Wermuth, PA-C, have reviewed all documentation for this visit. The documentation on 02/18/22 for the exam, diagnosis, procedures, and orders are all accurate and complete.   Talitha Givens, MHS, PA-C [facility]

## 2022-02-18 NOTE — Assessment & Plan Note (Signed)
Chronic, historic condition Appears well controlled on Metoprolol, Verapamil and Hydralazine  Continue current medication Follow up in 6 months for monitoring

## 2022-02-18 NOTE — Assessment & Plan Note (Signed)
Historic condition, recurrent Appears well controlled today  Currently taking Allopurinol for management/ prevention.  Continue current medications Follow up in 6 months for monitoring

## 2022-02-18 NOTE — Patient Instructions (Signed)
Continue with your current medications Please follow up with [place] around Nov for routine monitoring so we can make sure you are still feeling well.

## 2022-02-18 NOTE — Assessment & Plan Note (Signed)
Chronic, historic condition Currently taking Donepezil  Sees Neurology for management  Continue with Neurology for management

## 2022-03-17 ENCOUNTER — Other Ambulatory Visit: Payer: Self-pay | Admitting: Internal Medicine

## 2022-03-18 NOTE — Telephone Encounter (Signed)
Requested medication (s) are due for refill today: Yes  Requested medication (s) are on the active medication list: Yes  Last refill:  12/18/21  Future visit scheduled: No  Notes to clinic:  Unable to refill per protocol, last refill by another provider.      Requested Prescriptions  Pending Prescriptions Disp Refills   baclofen (LIORESAL) 10 MG tablet [Pharmacy Med Name: BACLOFEN 10 MG TABLET] 90 tablet 0    Sig: TAKE 1 TABLET BY MOUTH EVERY DAY AS NEEDED     Analgesics:  Muscle Relaxants - baclofen Failed - 03/17/2022  9:23 AM      Failed - Cr in normal range and within 180 days    Creatinine, Ser  Date Value Ref Range Status  08/06/2021 2.06 (H) 0.76 - 1.27 mg/dL Final         Failed - eGFR is 30 or above and within 180 days    GFR, Estimated  Date Value Ref Range Status  04/23/2021 24 (L) >60 mL/min Final    Comment:    (NOTE) Calculated using the CKD-EPI Creatinine Equation (2021)    eGFR  Date Value Ref Range Status  08/06/2021 32 (L) >59 mL/min/1.73 Final         Passed - Valid encounter within last 6 months    Recent Outpatient Visits           4 weeks ago Dementia due to Alzheimer's disease ([place])   [facility] Mecum, Erin E, PA-C   3 months ago Dementia due to Alzheimer's disease ([place])   [facility] [name], [name], MD   4 months ago High prostate specific antigen (PSA)   [facility] [name], [name], MD   6 months ago Need for influenza vaccination   [facility] [name], [name], MD   7 months ago Tachycardia   [facility] Charlynne Cousins, MD

## 2022-03-25 ENCOUNTER — Ambulatory Visit
Admission: RE | Admit: 2022-03-25 | Discharge: 2022-03-25 | Disposition: A | Discharge status: Home / Self Care | Payer: Federal, State, Local not specified - PPO | Source: Ambulatory Visit | Attending: Radiation Oncology | Admitting: Radiation Oncology

## 2022-03-25 VITALS — BP 143/77 | HR 72 | Resp 18 | Wt 207.7 lb

## 2022-03-25 DIAGNOSIS — C61 Malignant neoplasm of prostate: Secondary | ICD-10-CM | POA: Insufficient documentation

## 2022-03-25 DIAGNOSIS — Z923 Personal history of irradiation: Secondary | ICD-10-CM | POA: Diagnosis not present

## 2022-03-25 NOTE — Progress Notes (Signed)
Radiation Oncology Follow up Note  Name: Jason Estrada   Date:   03/25/2022 MRN:  [id number] DOB: [birth year]    This 81 y.o. 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  REFERRING PROVIDER: Charlynne Cousins, MD  HPI: Patient is a 81 year old male now out 1 month having completed image guided IMRT radiation therapy to his prostate and pelvic nodes for stage IIIb Gleason 7 adenocarcinoma presenting with a PSA in the 50 range.  Seen today in routine follow-up he is doing well his lower urinary tract symptoms are back to baseline specifically denies any urgency or frequency.  His bowels are also fine and his energy level is also good..  COMPLICATIONS OF TREATMENT: none  FOLLOW UP COMPLIANCE: keeps appointments   PHYSICAL EXAM:  BP (!) 143/77   Pulse 72   Resp 18   Wt 207 lb 11.2 oz (94.2 kg)   BMI 28.16 kg/m  Well-developed well-nourished patient in NAD. HEENT reveals PERLA, EOMI, discs not visualized.  Oral cavity is clear. No oral mucosal lesions are identified. Neck is clear without evidence of cervical or supraclavicular adenopathy. Lungs are clear to A&P. Cardiac examination is essentially unremarkable with regular rate and rhythm without murmur rub or thrill. Abdomen is benign with no organomegaly or masses noted. Motor sensory and DTR levels are equal and symmetric in the upper and lower extremities. Cranial nerves II through XII are grossly intact. Proprioception is intact. No peripheral adenopathy or edema is identified. No motor or sensory levels are noted. Crude visual fields are within normal range.  RADIOLOGY RESULTS: No current films for review  PLAN: The present time patient is very low side effect profile from his external beam image guided IMRT radiation therapy.  And pleased with his overall progress.   Of asked to see him back in 3 months with a PSA at that time.  He continues ADT therapy through urology and probably will be suppressed for at least 2 to 3 years.  I would like to take this opportunity to thank you for allowing me to participate in the care of your patient.Noreene Filbert, MD

## 2022-03-28 ENCOUNTER — Other Ambulatory Visit: Payer: Self-pay

## 2022-03-28 MED ORDER — ALLOPURINOL 100 MG PO TABS: 100.0000 mg | ORAL_TABLET | Freq: Every day | ORAL | 1 refills | Status: DC

## 2022-03-28 NOTE — Telephone Encounter (Signed)
LOV 02/18/22  No up coming appt noted at this time

## 2022-04-01 NOTE — Telephone Encounter (Signed)
Lvm asking patient to call back to schedule an appointment 

## 2022-04-02 ENCOUNTER — Other Ambulatory Visit: Payer: Self-pay

## 2022-04-02 DIAGNOSIS — I10 Essential (primary) hypertension: Secondary | ICD-10-CM

## 2022-04-02 MED ORDER — VERAPAMIL HCL ER 240 MG PO TBCR: 240.0000 mg | EXTENDED_RELEASE_TABLET | Freq: Every day | ORAL | 0 refills | Status: DC

## 2022-04-02 MED ORDER — COLCHICINE 0.6 MG PO TABS: 0.6000 mg | ORAL_TABLET | Freq: Every day | ORAL | 1 refills | Status: DC

## 2022-04-04 NOTE — Telephone Encounter (Signed)
Lvm asking patient to call back to schedule an appointment 

## 2022-04-09 NOTE — Telephone Encounter (Signed)
2nd attempt LVM asking patient to call back to schedule an appointment

## 2022-05-02 ENCOUNTER — Other Ambulatory Visit: Payer: Self-pay

## 2022-05-02 MED ORDER — HYDRALAZINE HCL 50 MG PO TABS
25.0000 mg | ORAL_TABLET | Freq: Two times a day (BID) | ORAL | 1 refills | Status: DC
Start: 2022-05-02 — End: 2022-08-20

## 2022-05-02 NOTE — Telephone Encounter (Signed)
Lvm asking patient to call back to schedule his follow up appointment

## 2022-05-02 NOTE — Telephone Encounter (Signed)
LOV 05/21/22  No future appt noted

## 2022-05-07 NOTE — Telephone Encounter (Signed)
2nd attempt to reach patient.

## 2022-05-10 NOTE — Telephone Encounter (Signed)
Patient needs an appointment  (around 08/20/2022

## 2022-05-10 NOTE — Telephone Encounter (Signed)
3rd attempt to reach patient.

## 2022-05-13 ENCOUNTER — Encounter: Payer: Self-pay | Admitting: Physician Assistant

## 2022-05-13 ENCOUNTER — Ambulatory Visit: Payer: Federal, State, Local not specified - PPO | Admitting: Physician Assistant

## 2022-05-13 VITALS — BP 130/70 | HR 67 | Ht 72.0 in | Wt 206.0 lb

## 2022-05-13 DIAGNOSIS — F0280 Dementia in other diseases classified elsewhere without behavioral disturbance: Secondary | ICD-10-CM | POA: Diagnosis not present

## 2022-05-13 DIAGNOSIS — G309 Alzheimer's disease, unspecified: Secondary | ICD-10-CM | POA: Diagnosis not present

## 2022-05-13 MED ORDER — MEMANTINE HCL 10 MG PO TABS: ORAL_TABLET | ORAL | 11 refills | Status: DC

## 2022-05-13 NOTE — Progress Notes (Unsigned)
Assessment/Plan:   Dementia likely due to  Jason Estrada is a very pleasant 81 y.o. RH male seen today in follow up for memory loss. Patient is currently on MMSE today is   /30  with delayed recall  /3       Follow up in   months.   Case discussed with Dr. Delice Lesch who agrees with the plan   Obsession with tash He does not argue so much about showering PT ended and he is not walking enough.  Reading mire  Chronic lose stools    Subjective:    This patient is accompanied in the office by  who supplements the history.  Previous records as well as any outside records available were reviewed prior to todays visit.    Any changes in memory since last visit? "Grasping for word " Patient lives with:  repeats oneself?  Endorsed Disoriented when walking into a room?  Patient denies   Leaving objects in unusual places?  Patient denies   Ambulates  with difficulty?   Patient denies   Recent falls?  Patient denies   Any head injuries?  Patient denies   History of seizures?   Patient denies   Wandering behavior?  Patient denies   Patient drives?   Patient no longer drives  Any mood changes since last visit?  Patient denies   Any worsening depression?:  Patient denies   Hallucinations?  Patient denies   Paranoia?  Patient denies   Patient reports that sleeps well without vivid dreams, REM behavior or sleepwalking   History of sleep apnea?  Patient denies   Any hygiene concerns?  Patient denies   Independent of bathing and dressing?  Endorsed  Does the patient needs help with medications?  Denies Who is in charge of the finances?   is in charge    Any changes in appetite?  Patient denies   Patient have trouble swallowing? Patient denies   Does the patient cook?  Patient denies   Any kitchen accidents such as leaving the stove on? Patient denies   Any headaches?  Patient denies   Double vision? Patient denies   Any focal numbness or tingling?  Patient denies   Chronic back pain  Patient denies   Unilateral weakness?  Patient denies   Any tremors?  Patient denies   Any history of anosmia?  Patient denies   Any incontinence of urine?  Patient denies   Any bowel dysfunction?   Patient denies        PREVIOUS MEDICATIONS:   CURRENT MEDICATIONS:  Outpatient Encounter Medications as of 05/13/2022  Medication Sig   allopurinol (ZYLOPRIM) 100 MG tablet Take 1 tablet (100 mg total) by mouth daily.   baclofen (LIORESAL) 10 MG tablet TAKE 1 TABLET BY MOUTH EVERY DAY AS NEEDED   calcitRIOL (ROCALTROL) 0.25 MCG capsule TAKE 1 CAPSULE BY MOUTH EVERY DAY   colchicine 0.6 MG tablet Take 1 tablet (0.6 mg total) by mouth daily.   donepezil (ARICEPT) 10 MG tablet Take 1 tablet at 10 mg daily   folic acid (FOLVITE) 1 MG tablet Take 1 tablet (1 mg total) by mouth daily.   hydrALAZINE (APRESOLINE) 50 MG tablet Take 0.5 tablets (25 mg total) by mouth in the morning and at bedtime. NEEDS APPOINTMENT FOR FURTHER REFILLS   pantoprazole (PROTONIX) 20 MG tablet TAKE 1 TABLET BY MOUTH EVERY DAY   pravastatin (PRAVACHOL) 40 MG tablet Take 1 tablet (40 mg total) by mouth daily.  verapamil (CALAN-SR) 240 MG CR tablet Take 1 tablet (240 mg total) by mouth daily.   metoprolol succinate (TOPROL-XL) 25 MG 24 hr tablet Take 25 mg by mouth daily.   No facility-administered encounter medications on file as of 05/13/2022.        No data to display            06/01/2021    9:00 AM  Montreal Cognitive Assessment   Visuospatial/ Executive (0/5) 4  Naming (0/3) 3  Attention: Read list of digits (0/2) 2  Attention: Read list of letters (0/1) 0  Attention: Serial 7 subtraction starting at 100 (0/3) 1  Language: Repeat phrase (0/2) 2  Language : Fluency (0/1) 0  Abstraction (0/2) 0  Delayed Recall (0/5) 0  Orientation (0/6) 4  Total 16  Adjusted Score (based on education) 17    Objective:     PHYSICAL EXAMINATION:    VITALS:   Vitals:   05/13/22 1249  BP: 130/70  Pulse: 67   SpO2: 96%  Weight: 206 lb (93.4 kg)  Height: 6' (1.829 m)    GEN:  The patient appears stated age and is in NAD. HEENT:  Normocephalic, atraumatic.   Neurological examination:  General: NAD, well-groomed, appears stated age. Orientation: The patient is alert. Oriented to person, place and date Cranial nerves: There is good facial symmetry.The speech is fluent and clear. No aphasia or dysarthria. Fund of knowledge is appropriate. Recent and remote memory are impaired. Attention and concentration are reduced.  Able to name objects and repeat phrases.  Hearing is intact to conversational tone.    Sensation: Sensation is intact to light touch throughout Motor: Strength is at least antigravity x4. Tremors: none  DTR's 2/4 in UE/LE     Movement examination: Tone: There is normal tone in the UE/LE Abnormal movements:  no tremor.  No myoclonus.  No asterixis.   Coordination:  There is no decremation with RAM's. Normal finger to nose  Gait and Station: The patient has no difficulty arising out of a deep-seated chair without the use of the hands. The patient's stride length is good.  Gait is cautious and narrow.    Thank you for allowing [place] the opportunity to participate in the care of this nice patient. Please do not hesitate to contact us for any questions or concerns.   Total time spent on today's visit was *** minutes dedicated to this patient today, preparing to see patient, examining the patient, ordering tests and/or medications and counseling the patient, documenting clinical information in the EHR or other health record, independently interpreting results and communicating results to the patient/family, discussing treatment and goals, answering patient's questions and coordinating care.  Cc:  Charlynne Cousins, MD  Sharene Butters 05/13/2022 12:58 PM

## 2022-05-13 NOTE — Patient Instructions (Addendum)
It was a pleasure to see you today at our office.   Recommendations:  Follow up in  6 months Continue Donepezil 10 mg daily  Start Memantine '10mg'$  tablets.  Take 1 tablet at bedtime for 2 weeks, then 1 tablet twice daily.         Whom to call:  Memory  decline, memory medications: Call our office [phone]   For psychiatric meds, mood meds: Please have your primary care physician manage these medications.   Counseling regarding caregiver distress, including caregiver depression, anxiety and issues regarding community resources, adult day care programs, adult living facilities, or memory care questions:   Feel free to contact [place], Social Worker at [phone]   For assessment of decision of mental capacity and competency:  Call Dr. Anthoney Harada, geriatric psychiatrist at [phone]  For guidance in geriatric dementia issues please call [facility] [phone]  For guidance regarding [facility] and if placement were needed at the facility, contact Arnell Asal, Social Worker tel: [phone]  If you have any severe symptoms of a stroke, or other severe issues such as confusion,severe chills or fever, etc call 911 or go to the ER as you may need to be evaluated further   Feel free to visit Facebook page " [email or web address]" for tips of how to care for people with memory problems.       RECOMMENDATIONS FOR ALL PATIENTS WITH MEMORY PROBLEMS: 1. Continue to exercise (Recommend 30 minutes of walking everyday, or 3 hours every week) 2. Increase social interactions - continue going to [place] and enjoy social gatherings with friends and family 3. Eat healthy, avoid fried foods and eat more fruits and vegetables 4. Maintain adequate blood pressure, blood sugar, and blood cholesterol level. Reducing the risk of stroke and cardiovascular disease also helps promoting better memory. 5. Avoid stressful situations. Live a simple life and avoid  aggravations. Organize your time and prepare for the next day in anticipation. 6. Sleep well, avoid any interruptions of sleep and avoid any distractions in the bedroom that may interfere with adequate sleep quality 7. Avoid sugar, avoid sweets as there is a strong link between excessive sugar intake, diabetes, and cognitive impairment We discussed the Mediterranean diet, which has been shown to help patients reduce the risk of progressive memory disorders and reduces cardiovascular risk. This includes eating fish, eat fruits and green leafy vegetables, nuts like almonds and hazelnuts, walnuts, and also use olive oil. Avoid fast foods and fried foods as much as possible. Avoid sweets and sugar as sugar use has been linked to worsening of memory function.  There is always a concern of gradual progression of memory problems. If this is the case, then we may need to adjust level of care according to patient needs. Support, both to the patient and caregiver, should then be put into place.    The Alzheimer's Association is here all day, every day for people facing Alzheimer's disease through our free 24/7 Helpline: 4700891270. The Helpline provides reliable information and support to all those who need assistance, such as individuals living with memory loss, Alzheimer's or other dementia, caregivers, health care professionals and the public.  Our highly trained and knowledgeable staff can help you with: Understanding memory loss, dementia and Alzheimer's  Medications and other treatment options  General information about aging and brain health  Skills to provide quality care and to find the best care from professionals  Legal, financial and living-arrangement decisions Our Helpline also  features: Confidential care consultation provided by master's level clinicians who can help with decision-making support, crisis assistance and education on issues families face every day  Help in a caller's preferred  language using our translation service that features more than 200 languages and dialects  Referrals to local community programs, services and ongoing support     FALL PRECAUTIONS: Be cautious when walking. Scan the area for obstacles that may increase the risk of trips and falls. When getting up in the mornings, sit up at the edge of the bed for a few minutes before getting out of bed. Consider elevating the bed at the head end to avoid drop of blood pressure when getting up. Walk always in a well-lit room (use night lights in the walls). Avoid area rugs or power cords from appliances in the middle of the walkways. Use a walker or a cane if necessary and consider physical therapy for balance exercise. Get your eyesight checked regularly.  FINANCIAL OVERSIGHT: Supervision, especially oversight when making financial decisions or transactions is also recommended.  HOME SAFETY: Consider the safety of the kitchen when operating appliances like stoves, microwave oven, and blender. Consider having supervision and share cooking responsibilities until no longer able to participate in those. Accidents with firearms and other hazards in the house should be identified and addressed as well.   ABILITY TO BE LEFT ALONE: If patient is unable to contact 911 operator, consider using LifeLine, or when the need is there, arrange for someone to stay with patients. Smoking is a fire hazard, consider supervision or cessation. Risk of wandering should be assessed by caregiver and if detected at any point, supervision and safe proof recommendations should be instituted.  MEDICATION SUPERVISION: Inability to self-administer medication needs to be constantly addressed. Implement a mechanism to ensure safe administration of the medications.   DRIVING: Regarding driving, in patients with progressive memory problems, driving will be impaired. We advise to have someone else do the driving if trouble finding directions or if  minor accidents are reported. Independent driving assessment is available to determine safety of driving.   If you are interested in the driving assessment, you can contact the following:  [employer] in [place]  [place] [phone] or [phone]      [place] refers to food and lifestyle choices that are based on the traditions of countries located on the [employer]. This way of eating has been shown to help prevent certain conditions and improve outcomes for people who have chronic diseases, like kidney disease and heart disease. What are tips for following this plan? Lifestyle  Cook and eat meals together with your family, when possible. Drink enough fluid to keep your urine clear or pale yellow. Be physically active every day. This includes: Aerobic exercise like running or swimming. Leisure activities like gardening, walking, or housework. Get 7-8 hours of sleep each night. If recommended by your health care provider, drink red wine in moderation. This means 1 glass a day for nonpregnant women and 2 glasses a day for men. A glass of wine equals 5 oz (150 mL). Reading food labels  Check the serving size of packaged foods. For foods such as rice and pasta, the serving size refers to the amount of cooked product, not dry. Check the total fat in packaged foods. Avoid foods that have saturated fat or trans fats. Check the ingredients list for added sugars,  such as corn syrup. Shopping  At the grocery store, buy most of your food from the areas near the walls of the store. This includes: Fresh fruits and vegetables (produce). Grains, beans, nuts, and seeds. Some of these may be available in unpackaged forms or large amounts (in bulk). Fresh seafood. Poultry and eggs. Low-fat dairy products. Buy whole ingredients instead of  prepackaged foods. Buy fresh fruits and vegetables in-season from local farmers markets. Buy frozen fruits and vegetables in resealable bags. If you do not have access to quality fresh seafood, buy precooked frozen shrimp or canned fish, such as tuna, salmon, or sardines. Buy small amounts of raw or cooked vegetables, salads, or olives from the deli or salad bar at your store. Stock your pantry so you always have certain foods on hand, such as olive oil, canned tuna, canned tomatoes, rice, pasta, and beans. Cooking  Cook foods with extra-virgin olive oil instead of using butter or other vegetable oils. Have meat as a side dish, and have vegetables or grains as your main dish. This means having meat in small portions or adding small amounts of meat to foods like pasta or stew. Use beans or vegetables instead of meat in common dishes like chili or lasagna. Experiment with different cooking methods. Try roasting or broiling vegetables instead of steaming or sauteing them. Add frozen vegetables to soups, stews, pasta, or rice. Add nuts or seeds for added healthy fat at each meal. You can add these to yogurt, salads, or vegetable dishes. Marinate fish or vegetables using olive oil, lemon juice, garlic, and fresh herbs. Meal planning  Plan to eat 1 vegetarian meal one day each week. Try to work up to 2 vegetarian meals, if possible. Eat seafood 2 or more times a week. Have healthy snacks readily available, such as: Vegetable sticks with hummus. Greek yogurt. Fruit and nut trail mix. Eat balanced meals throughout the week. This includes: Fruit: 2-3 servings a day Vegetables: 4-5 servings a day Low-fat dairy: 2 servings a day Fish, poultry, or lean meat: 1 serving a day Beans and legumes: 2 or more servings a week Nuts and seeds: 1-2 servings a day Whole grains: 6-8 servings a day Extra-virgin olive oil: 3-4 servings a day Limit red meat and sweets to only a few servings a month What are my  food choices? Mediterranean diet Recommended Grains: Whole-grain pasta. Brown rice. Bulgar wheat. Polenta. Couscous. Whole-wheat bread. [place]. Vegetables: Artichokes. Beets. Broccoli. Cabbage. Carrots. Eggplant. Green beans. Chard. Kale. Spinach. Onions. Leeks. Peas. Squash. Tomatoes. Peppers. Radishes. Fruits: Apples. Apricots. Avocado. Berries. Bananas. Cherries. Dates. Figs. Grapes. Lemons. Melon. Oranges. Peaches. Plums. Pomegranate. Meats and other protein foods: Beans. Almonds. Sunflower seeds. Pine nuts. Peanuts. [place]. Salmon. Scallops. Shrimp. [place]. Tilapia. Clams. Oysters. Eggs. Dairy: Low-fat milk. Cheese. Greek yogurt. Beverages: Water. Red wine. Herbal tea. Fats and oils: Extra virgin olive oil. Avocado oil. Grape seed oil. Sweets and desserts: [place] yogurt with honey. Baked apples. Poached pears. Trail mix. Seasoning and other foods: Basil. Cilantro. Coriander. Cumin. Mint. Parsley. Sage. Rosemary. Tarragon. Garlic. Oregano. Thyme. Pepper. Balsalmic vinegar. Tahini. Hummus. Tomato sauce. Olives. Mushrooms. Limit these Grains: Prepackaged pasta or rice dishes. Prepackaged cereal with added sugar. Vegetables: Deep fried potatoes (french fries). Fruits: Fruit canned in syrup. Meats and other protein foods: Beef. Pork. Lamb. Poultry with skin. Hot dogs. Berniece Salines. Dairy: Ice cream. Sour cream. Whole milk. Beverages: Juice. Sugar-sweetened soft drinks. Beer. Liquor and spirits. Fats and oils: Butter. Canola oil. Vegetable oil. Beef fat (  tallow). Lard. Sweets and desserts: Cookies. Cakes. Pies. Candy. Seasoning and other foods: Mayonnaise. Premade sauces and marinades. The items listed may not be a complete list. Talk with your dietitian about what dietary choices are right for you. Summary The Mediterranean diet includes both food and lifestyle choices. Eat a variety of fresh fruits and vegetables, beans, nuts, seeds, and whole grains. Limit the amount of red meat and sweets  that you eat. Talk with your health care provider about whether it is safe for you to drink red wine in moderation. This means 1 glass a day for nonpregnant women and 2 glasses a day for men. A glass of wine equals 5 oz (150 mL). This information is not intended to replace advice given to you by your health care provider. Make sure you discuss any questions you have with your health care provider. Document Released: 04/25/2016 Document Revised: 05/28/2016 Document Reviewed: 04/25/2016 Elsevier Interactive Patient Education  2017 [employer].

## 2022-05-21 ENCOUNTER — Other Ambulatory Visit: Payer: Self-pay

## 2022-05-21 MED ORDER — FOLIC ACID 1 MG PO TABS: 1.0000 mg | ORAL_TABLET | Freq: Every day | ORAL | 1 refills | Status: DC

## 2022-05-21 NOTE — Telephone Encounter (Signed)
LOV 02/18/22  Future appt 08/20/22

## 2022-05-23 ENCOUNTER — Other Ambulatory Visit: Payer: Self-pay

## 2022-05-23 MED ORDER — PRAVASTATIN SODIUM 40 MG PO TABS: 40.0000 mg | ORAL_TABLET | Freq: Every day | ORAL | 1 refills | Status: DC

## 2022-05-23 NOTE — Telephone Encounter (Signed)
LOV 02/18/22   Future appt 08/20/22  Last Lipid 02/13/22

## 2022-06-06 ENCOUNTER — Other Ambulatory Visit: Payer: Self-pay | Admitting: *Deleted

## 2022-06-06 ENCOUNTER — Other Ambulatory Visit: Payer: Self-pay | Admitting: Physician Assistant

## 2022-06-06 DIAGNOSIS — C61 Malignant neoplasm of prostate: Secondary | ICD-10-CM

## 2022-06-07 ENCOUNTER — Other Ambulatory Visit: Payer: Federal, State, Local not specified - PPO

## 2022-06-07 DIAGNOSIS — C61 Malignant neoplasm of prostate: Secondary | ICD-10-CM

## 2022-06-09 LAB — PSA: Prostate Specific Ag, Serum: <0.1 ng/mL (ref 0.0–4.0)

## 2022-06-12 ENCOUNTER — Encounter: Payer: Self-pay | Admitting: Urology

## 2022-06-12 ENCOUNTER — Ambulatory Visit (INDEPENDENT_AMBULATORY_CARE_PROVIDER_SITE_OTHER): Payer: Federal, State, Local not specified - PPO | Admitting: Urology

## 2022-06-12 VITALS — BP 157/78 | HR 96 | Ht 72.0 in | Wt 198.0 lb

## 2022-06-12 DIAGNOSIS — C61 Malignant neoplasm of prostate: Secondary | ICD-10-CM | POA: Diagnosis not present

## 2022-06-12 MED ORDER — LEUPROLIDE ACETATE (6 MONTH) 45 MG ~~LOC~~ KIT
45.0000 mg | PACK | Freq: Once | SUBCUTANEOUS | Status: AC
  Administered 2022-06-12: 45 mg via SUBCUTANEOUS

## 2022-06-12 NOTE — Progress Notes (Signed)
Eligard SubQ Injection   Due to Prostate Cancer patient is present today for a Eligard Injection.  Medication: Eligard 6 month Dose: 45 mg  Location: right  Lot: [id number] Exp: 09/17/2023  Patient tolerated well, no complications were noted  Performed by: Elberta Leatherwood, [place]  Per Dr. Diamantina Providence patient is to continue therapy for 2 years . Patient's next follow up was scheduled for 6 months. This appointment was scheduled using wheel and given to patient today along with reminder continue on Vitamin D 800-1000iu and Calcium 1000-'1200mg'$  daily while on Androgen Deprivation Therapy.  PA approval dates:

## 2022-06-12 NOTE — Progress Notes (Signed)
   06/12/2022 4:12 PM   Jason Estrada 05-24-1941 [id number]  Reason for visit: Follow up high risk prostate cancer  HPI: 81 year old male with a number of comorbidities including dementia and stage IV CKD who was found to have an elevated PSA of 50 that remained elevated.  He underwent a prostate MRI that showed a 38 g prostate with a PI-RADS 5 lesion in the right anterior mid gland tracking towards the base with suspected early extracapsular disease along the right anterior lateral margin of the prostate, as well as a PI-RADS 4 lesion at the left prostate at the margin of the transition zone.  There was no lymphadenopathy or bony lesion in the pelvis.  He underwent a cognitive MRI biopsy that showed intermediate risk disease with Gleason score 3+4=7 disease.  PSMA PET scan showed no evidence of metastatic disease.  Using shared decision making, he opted for 2 years of ADT with X normal beam radiation.  This was completed in June 2023, and initial ADT injection was given 12/10/2021.  He did very well with radiation and really denies any side effects.  He had some mild fatigue on the ADT but overall doing well.  He denies any urinary complaints.  PSA 06/07/2022 undetectable.  We discussed the need to continue ADT for 2 years, and wants of monitoring the PSA trend, and possible need for additional treatments in the future.  72-monthADT injection given today(continue 2 years from March 2023) RTC 6 months PSA prior   BBilley Co MD  [address] [place] [phone]

## 2022-06-12 NOTE — Patient Instructions (Signed)
continue on Vitamin D 800-1000iu and Calcium 1000-1200mg daily while on Androgen Deprivation Therapy.  

## 2022-06-17 ENCOUNTER — Other Ambulatory Visit: Payer: Self-pay | Admitting: Nurse Practitioner

## 2022-06-17 NOTE — Telephone Encounter (Signed)
Requested medication (s) are due for refill today: Due 06/20/22  Requested medication (s) are on the active medication list: yes    Last refill: 03/20/22 #90  0 refills  Future visit scheduled yes 08/20/22  Notes to clinic: Failed due to labs. Thank you.  Requested Prescriptions  Pending Prescriptions Disp Refills   baclofen (LIORESAL) 10 MG tablet [Pharmacy Med Name: BACLOFEN 10 MG TABLET] 90 tablet 0    Sig: TAKE 1 TABLET BY MOUTH EVERY DAY AS NEEDED     Analgesics:  Muscle Relaxants - baclofen Failed - 06/17/2022  2:28 AM      Failed - Cr in normal range and within 180 days    Creatinine, Ser  Date Value Ref Range Status  08/06/2021 2.06 (H) 0.76 - 1.27 mg/dL Final         Failed - eGFR is 30 or above and within 180 days    GFR, Estimated  Date Value Ref Range Status  04/23/2021 24 (L) >60 mL/min Final    Comment:    (NOTE) Calculated using the CKD-EPI Creatinine Equation (2021)    eGFR  Date Value Ref Range Status  08/06/2021 32 (L) >59 mL/min/1.73 Final         Passed - Valid encounter within last 6 months    Recent Outpatient Visits           3 months ago 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)   [facility] Mecum, Erin E, PA-C   6 months ago Dementia due to Alzheimer's disease ([place])   [facility] [name], [name], MD   7 months ago High prostate specific antigen (PSA)   [place] [name], [name], MD   9 months ago Need for influenza vaccination   [place] [name], [name], MD   10 months ago [place], [name], MD       Future Appointments             In 2 months Cannady, Barbaraann Faster, NP [employer]   In 5 months Diamantina Providence, Herbert Seta, MD [place]

## 2022-06-24 ENCOUNTER — Other Ambulatory Visit: Payer: Federal, State, Local not specified - PPO

## 2022-06-25 ENCOUNTER — Other Ambulatory Visit: Payer: Self-pay

## 2022-06-25 MED ORDER — CALCITRIOL 0.25 MCG PO CAPS: ORAL_CAPSULE | ORAL | 0 refills | Status: DC

## 2022-06-25 NOTE — Telephone Encounter (Signed)
Medication refill for Calcitriol 0.33mg caps last ov 02/18/22, upcoming ov 08/20/22 . Please advise

## 2022-07-01 ENCOUNTER — Ambulatory Visit
Admission: RE | Admit: 2022-07-01 | Discharge: 2022-07-01 | Disposition: A | Discharge status: Home / Self Care | Payer: Federal, State, Local not specified - PPO | Source: Ambulatory Visit | Attending: Radiation Oncology | Admitting: Radiation Oncology

## 2022-07-01 ENCOUNTER — Other Ambulatory Visit: Payer: Self-pay | Admitting: Nurse Practitioner

## 2022-07-01 VITALS — BP 132/62 | HR 73 | Temp 97.8°F | Resp 16 | Wt 201.9 lb

## 2022-07-01 DIAGNOSIS — R5383 Other fatigue: Secondary | ICD-10-CM | POA: Insufficient documentation

## 2022-07-01 DIAGNOSIS — R197 Diarrhea, unspecified: Secondary | ICD-10-CM | POA: Diagnosis not present

## 2022-07-01 DIAGNOSIS — C61 Malignant neoplasm of prostate: Secondary | ICD-10-CM | POA: Insufficient documentation

## 2022-07-01 DIAGNOSIS — Z923 Personal history of irradiation: Secondary | ICD-10-CM | POA: Insufficient documentation

## 2022-07-01 DIAGNOSIS — I10 Essential (primary) hypertension: Secondary | ICD-10-CM

## 2022-07-01 MED ORDER — PANTOPRAZOLE SODIUM 20 MG PO TBEC: 20.0000 mg | DELAYED_RELEASE_TABLET | Freq: Every day | ORAL | 1 refills | Status: DC

## 2022-07-01 NOTE — Progress Notes (Signed)
Radiation Oncology Follow up Note  Name: Jason Estrada   Date:   07/01/2022 MRN:  [id number] DOB: 09/02/1941    This 81 y.o. 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  Seen today in routine follow-up he is doing well specifically denies any increased lower urinary tract symptoms diarrhea or fatigue..Marland Kitchen He currently is on ADT therapy.  His PSA is less than 0.1  COMPLICATIONS OF TREATMENT: none  FOLLOW UP COMPLIANCE: keeps appointments   PHYSICAL EXAM:  BP 132/62   Pulse 73   Temp 97.8 F (36.6 C)   Resp 16   Wt 201 lb 14.4 oz (91.6 kg)   BMI 27.38 kg/m  Well-developed well-nourished patient in NAD. HEENT reveals PERLA, EOMI, discs not visualized.  Oral cavity is clear. No oral mucosal lesions are identified. Neck is clear without evidence of cervical or supraclavicular adenopathy. Lungs are clear to A&P. Cardiac examination is essentially unremarkable with regular rate and rhythm without murmur rub or thrill. Abdomen is benign with no organomegaly or masses noted. Motor sensory and DTR levels are equal and symmetric in the upper and lower extremities. Cranial nerves II through XII are grossly intact. Proprioception is intact. No peripheral adenopathy or edema is identified. No motor or sensory levels are noted. Crude visual fields are within normal range.  RADIOLOGY RESULTS: No current films for review  PLAN: Time patient is doing well under excellent biochemical control of his prostate cancer.  He continues on ADT therapy.  I have asked to see him back in 6 months  for follow-up with a PSA at that time.  Patient is to call sooner with any concerns.  I would like to take this opportunity to thank you for allowing me to participate in the care of your patient..Noreene Filbert MD

## 2022-07-01 NOTE — Telephone Encounter (Signed)
Medication refill for Pantoprazole 20 mg last ov 02/18/22, upcoming ov 08/20/22 . Please advise

## 2022-08-18 DIAGNOSIS — E210 Primary hyperparathyroidism: Secondary | ICD-10-CM | POA: Insufficient documentation

## 2022-08-18 HISTORY — DX: Primary hyperparathyroidism: E21.0

## 2022-08-18 NOTE — Patient Instructions (Signed)

## 2022-08-20 ENCOUNTER — Encounter: Payer: Self-pay | Admitting: Nurse Practitioner

## 2022-08-20 ENCOUNTER — Ambulatory Visit (INDEPENDENT_AMBULATORY_CARE_PROVIDER_SITE_OTHER): Payer: Federal, State, Local not specified - PPO | Admitting: Nurse Practitioner

## 2022-08-20 VITALS — BP 130/76 | HR 62 | Temp 97.7°F | Ht 72.01 in | Wt 207.6 lb

## 2022-08-20 DIAGNOSIS — Z23 Encounter for immunization: Secondary | ICD-10-CM | POA: Diagnosis not present

## 2022-08-20 DIAGNOSIS — G8929 Other chronic pain: Secondary | ICD-10-CM

## 2022-08-20 DIAGNOSIS — C61 Malignant neoplasm of prostate: Secondary | ICD-10-CM

## 2022-08-20 DIAGNOSIS — G309 Alzheimer's disease, unspecified: Secondary | ICD-10-CM

## 2022-08-20 DIAGNOSIS — F0280 Dementia in other diseases classified elsewhere without behavioral disturbance: Secondary | ICD-10-CM

## 2022-08-20 DIAGNOSIS — Z1159 Encounter for screening for other viral diseases: Secondary | ICD-10-CM

## 2022-08-20 DIAGNOSIS — M25561 Pain in right knee: Secondary | ICD-10-CM

## 2022-08-20 DIAGNOSIS — E782 Mixed hyperlipidemia: Secondary | ICD-10-CM

## 2022-08-20 DIAGNOSIS — N184 Chronic kidney disease, stage 4 (severe): Secondary | ICD-10-CM | POA: Diagnosis not present

## 2022-08-20 DIAGNOSIS — M1A3720 Chronic gout due to renal impairment, left ankle and foot, without tophus (tophi): Secondary | ICD-10-CM

## 2022-08-20 DIAGNOSIS — I10 Essential (primary) hypertension: Secondary | ICD-10-CM

## 2022-08-20 DIAGNOSIS — E210 Primary hyperparathyroidism: Secondary | ICD-10-CM

## 2022-08-20 HISTORY — DX: Other chronic pain: G89.29

## 2022-08-20 MED ORDER — HYDRALAZINE HCL 50 MG PO TABS: 25.0000 mg | ORAL_TABLET | Freq: Two times a day (BID) | ORAL | 4 refills | Status: DC

## 2022-08-20 MED ORDER — PRAVASTATIN SODIUM 40 MG PO TABS: 40.0000 mg | ORAL_TABLET | Freq: Every day | ORAL | 4 refills | Status: DC

## 2022-08-20 MED ORDER — ALLOPURINOL 100 MG PO TABS: 100.0000 mg | ORAL_TABLET | Freq: Every day | ORAL | 4 refills | Status: DC

## 2022-08-20 MED ORDER — VERAPAMIL HCL ER 240 MG PO TBCR: 240.0000 mg | EXTENDED_RELEASE_TABLET | Freq: Every day | ORAL | 4 refills | Status: DC

## 2022-08-20 MED ORDER — PANTOPRAZOLE SODIUM 20 MG PO TBEC: 20.0000 mg | DELAYED_RELEASE_TABLET | Freq: Every day | ORAL | 4 refills | Status: DC

## 2022-08-20 NOTE — Assessment & Plan Note (Signed)
Chronic, ongoing with BP stable on recheck today and at goal on home readings.  Recommend he monitor BP at least a few mornings a week at home and document.  DASH diet at home.  Continue current medication regimen and adjust as needed.  Labs today: up to date with nephrology.  Return in 6 months.

## 2022-08-20 NOTE — Assessment & Plan Note (Signed)
Chronic, ongoing.  Followed by urology and continues treatment -- recent notes reviewed.

## 2022-08-20 NOTE — Assessment & Plan Note (Signed)
Chronic, stable with no recent flares.  Continue Allopurinol as ordered and further renal dose if needed.  Uric acid level today.

## 2022-08-20 NOTE — Assessment & Plan Note (Signed)
Chronic, progressive.  On memory care medications per neurology.  Continue these and collaboration with neurology, recent note reviewed.

## 2022-08-20 NOTE — Assessment & Plan Note (Signed)
Chronic, ongoing.  Followed by nephrology, continue this collaboration.  Recent notes and labs reviewed. 

## 2022-08-20 NOTE — Progress Notes (Signed)
BP 130/76 (BP Location: Left Arm, Patient Position: Sitting, Cuff Size: Normal)   Pulse 62   Temp 97.7 F (36.5 C) (Oral)   Ht 6' 0.01" (1.829 m)   Wt 207 lb 9.6 oz (94.2 kg)   SpO2 97%   BMI 28.15 kg/m    Subjective:    Patient ID: Jason Estrada, male    DOB: 08-Dec-1940, 81 y.o.   MRN: [id number]  HPI: Jason Estrada is a 81 y.o. male  Chief Complaint  Patient presents with   Dementia   Hypertension   Hyperlipidemia   HYPERTENSION / HYPERLIPIDEMIA + DEMENTIA Current medications: Pravastatin, Hydralazine.  Is followed by neurology with last visit on 11/13/21.  Taking Memantine and Aricept. No recent falls or fractures, is to be using cane consistently but does not.  Continues to have right knee pain which his wife reports is worsening and is barely walking.  Would like to return to ortho.  At home is able to do many things on own: dressing and toileting.  Only drives short distances. Satisfied with current treatment? yes Duration of hypertension: chronic BP monitoring frequency: a few times a week BP range: 130/70-80 range BP medication side effects: no Duration of hyperlipidemia: chronic Cholesterol medication side effects: no Cholesterol supplements: none Medication compliance: good compliance Aspirin: no Recent stressors: no Recurrent headaches: no Visual changes: no Palpitations: no Dyspnea: no Chest pain: no Lower extremity edema: no Dizzy/lightheaded: no   CHRONIC KIDNEY DISEASE Followed by nephrology with last visit on 06/18/22 -- labs CRT 2.42, eGFR 27, BUN 29, HGB 12.8.  Is also seeing urology for prostate cancer with last visit 06/12/22, radiation completed 02/15/22.  Continues treatment shots.  Continues on Allopurinol for history of gout, no flare in over a year.   CKD status: stable Medications renally dose: yes Previous renal evaluation: yes Pneumovax:  Up to Date Influenza Vaccine:  Up to Date   Relevant past medical, surgical, family and social  history reviewed and updated as indicated. Interim medical history since our last visit reviewed. Allergies and medications reviewed and updated.  Review of Systems  Constitutional:  Negative for activity change, diaphoresis, fatigue and fever.  Respiratory:  Negative for cough, chest tightness, shortness of breath and wheezing.   Cardiovascular:  Negative for chest pain, palpitations and leg swelling.  Gastrointestinal: Negative.   Musculoskeletal:  Positive for arthralgias.  Psychiatric/Behavioral: Negative.      Per HPI unless specifically indicated above     Objective:    BP 130/76 (BP Location: Left Arm, Patient Position: Sitting, Cuff Size: Normal)   Pulse 62   Temp 97.7 F (36.5 C) (Oral)   Ht 6' 0.01" (1.829 m)   Wt 207 lb 9.6 oz (94.2 kg)   SpO2 97%   BMI 28.15 kg/m   Wt Readings from Last 3 Encounters:  08/20/22 207 lb 9.6 oz (94.2 kg)  07/01/22 201 lb 14.4 oz (91.6 kg)  06/12/22 198 lb (89.8 kg)    Physical Exam Vitals and nursing note reviewed.  Constitutional:      General: He is awake. He is not in acute distress.    Appearance: He is well-developed and well-groomed. He is not ill-appearing or toxic-appearing.  HENT:     Head: Normocephalic and atraumatic.     Right Ear: Hearing normal. No drainage.     Left Ear: Hearing normal. No drainage.  Eyes:     General: Lids are normal.        Right eye: No  discharge.        Left eye: No discharge.     Conjunctiva/sclera: Conjunctivae normal.     Pupils: Pupils are equal, round, and reactive to light.  Neck:     Thyroid: No thyromegaly.     Vascular: No carotid bruit or JVD.     Trachea: Trachea normal.  Cardiovascular:     Rate and Rhythm: Normal rate and regular rhythm.     Heart sounds: Normal heart sounds, S1 normal and S2 normal. No murmur heard.    No gallop.  Pulmonary:     Effort: Pulmonary effort is normal. No accessory muscle usage or respiratory distress.     Breath sounds: Normal breath sounds.   Abdominal:     General: Bowel sounds are normal.     Palpations: Abdomen is soft. There is no hepatomegaly or splenomegaly.  Musculoskeletal:     Cervical back: Normal range of motion and neck supple.     Right knee: Swelling present. Decreased range of motion. No tenderness.     Left knee: Normal.     Right lower leg: No edema.     Left lower leg: No edema.     Comments: Antalgic gait present -- not using cane or walker.  Skin:    General: Skin is warm and dry.     Capillary Refill: Capillary refill takes less than 2 seconds.     Findings: No rash.  Neurological:     Mental Status: He is alert and oriented to person, place, and time.     Comments: Takes time on recall, but overall oriented today.  Psychiatric:        Attention and Perception: Attention normal.        Mood and Affect: Mood normal.        Speech: Speech normal.        Behavior: Behavior normal. Behavior is cooperative.        Thought Content: Thought content normal.     Results for orders placed or performed in visit on 06/07/22  PSA  Result Value Ref Range   Prostate Specific Ag, Serum <0.1 0.0 - 4.0 ng/mL      Assessment & Plan:   Problem List Items Addressed This Visit       Cardiovascular and Mediastinum   Essential hypertension    Chronic, ongoing with BP stable on recheck today and at goal on home readings.  Recommend he monitor BP at least a few mornings a week at home and document.  DASH diet at home.  Continue current medication regimen and adjust as needed.  Labs today: up to date with nephrology.  Return in 6 months.       Relevant Medications   hydrALAZINE (APRESOLINE) 50 MG tablet   pravastatin (PRAVACHOL) 40 MG tablet   verapamil (CALAN-SR) 240 MG CR tablet   Other Relevant Orders   TSH     Endocrine   Primary hyperparathyroidism (HCC)    Chronic, ongoing.  Followed by nephrology, continue this collaboration.  Recent notes and labs reviewed.        Nervous and Auditory    Dementia due to Alzheimer's disease    Chronic, progressive.  On memory care medications per neurology.  Continue these and collaboration with neurology, recent note reviewed.      Relevant Orders   Vitamin B12     Genitourinary   Chronic kidney disease, stage 4 (severe)    Chronic, ongoing.  Followed by nephrology, recent notes  and labs reviewed.        Prostate cancer ([place]) - Primary    Chronic, ongoing.  Followed by urology and continues treatment -- recent notes reviewed.      Relevant Medications   allopurinol (ZYLOPRIM) 100 MG tablet     Other   Chronic pain of right knee    Ongoing issue with worsening, concern for falls. Recommend he use walker or cane consistently.  Referral to ortho placed.      Relevant Orders   Ambulatory referral to Orthopedics   Gout    Chronic, stable with no recent flares.  Continue Allopurinol as ordered and further renal dose if needed.  Uric acid level today.      Relevant Orders   Uric acid   Hyperlipidemia    Chronic, ongoing.  Continue current medication regimen and adjust as needed.  Lipid panel today.         Relevant Medications   hydrALAZINE (APRESOLINE) 50 MG tablet   pravastatin (PRAVACHOL) 40 MG tablet   verapamil (CALAN-SR) 240 MG CR tablet   Other Relevant Orders   Lipid Panel w/o Chol/HDL Ratio   Other Visit Diagnoses     Need for hepatitis C screening test       Hep C screen on labs today per guidelines for one time screening, discussed with patient.   Relevant Orders   Hepatitis C antibody   Need for influenza vaccination       Flu vaccine in office today.   Relevant Orders   Pneumococcal conjugate vaccine 20-valent (Prevnar 20) (Completed)   Flu Vaccine QUAD High Dose(Fluad) (Completed)   Need for pneumococcal 20-valent conjugate vaccination       PCV20 in office today.   Primary hypertension       Relevant Medications   hydrALAZINE (APRESOLINE) 50 MG tablet   pravastatin (PRAVACHOL) 40 MG tablet    verapamil (CALAN-SR) 240 MG CR tablet        Follow up plan: Return in about 6 months (around 02/19/2023) for DEMENTIA, HTN/HLD, CKD, GOUT, PROSTATE CA.

## 2022-08-20 NOTE — Assessment & Plan Note (Signed)
Chronic, ongoing.  Followed by nephrology, recent notes and labs reviewed.

## 2022-08-20 NOTE — Assessment & Plan Note (Signed)
Ongoing issue with worsening, concern for falls. Recommend he use walker or cane consistently.  Referral to ortho placed.

## 2022-08-20 NOTE — Assessment & Plan Note (Signed)
Chronic, ongoing.  Continue current medication regimen and adjust as needed. Lipid panel today. 

## 2022-08-21 LAB — LIPID PANEL W/O CHOL/HDL RATIO
Cholesterol, Total: 132 mg/dL (ref 100–199)
HDL: 70 mg/dL (ref >39)
LDL Chol Calc (NIH): 46 mg/dL (ref 0–99)
Triglycerides: 84 mg/dL (ref 0–149)
VLDL Cholesterol Cal: 16 mg/dL (ref 5–40)

## 2022-08-21 LAB — TSH: TSH: 0.665 u[IU]/mL (ref 0.450–4.500)

## 2022-08-21 LAB — VITAMIN B12: Vitamin B-12: 229 pg/mL — ABNORMAL LOW (ref 232–1245)

## 2022-08-21 LAB — URIC ACID: Uric Acid: 6.4 mg/dL (ref 3.8–8.4)

## 2022-08-21 LAB — HEPATITIS C ANTIBODY: Hep C Virus Ab: NONREACTIVE

## 2022-08-21 NOTE — Progress Notes (Signed)
Good morning, please let Carnel and his wife know labs have returned and these are overall stable with exception of B12 level.  B12 is important for memory -- I recommend you start taking over the counter Vitamin B12 1000 mcg daily.  Level is 229 and would like to see greater then 300.  Any questions? Keep being amazing!!  Thank you for allowing me to participate in your care.  I appreciate you. Kindest regards, Dameian Crisman

## 2022-09-14 ENCOUNTER — Other Ambulatory Visit: Payer: Self-pay | Admitting: Nurse Practitioner

## 2022-09-15 NOTE — Telephone Encounter (Signed)
Requested medication (s) are due for refill today: yes  Requested medication (s) are on the active medication list: yes  Last refill:  06/18/22 #90/0  Future visit scheduled: yes  Notes to clinic:  Unable to refill per protocol due to failed labs, no updated results.      Requested Prescriptions  Pending Prescriptions Disp Refills   baclofen (LIORESAL) 10 MG tablet [Pharmacy Med Name: BACLOFEN 10 MG TABLET] 90 tablet 0    Sig: TAKE 1 TABLET BY MOUTH EVERY DAY AS NEEDED     Analgesics:  Muscle Relaxants - baclofen Failed - 09/14/2022  8:54 AM      Failed - Cr in normal range and within 180 days    Creatinine, Ser  Date Value Ref Range Status  08/06/2021 2.06 (H) 0.76 - 1.27 mg/dL Final         Failed - eGFR is 30 or above and within 180 days    GFR, Estimated  Date Value Ref Range Status  04/23/2021 24 (L) >60 mL/min Final    Comment:    (NOTE) Calculated using the CKD-EPI Creatinine Equation (2021)    eGFR  Date Value Ref Range Status  08/06/2021 32 (L) >59 mL/min/1.73 Final         Passed - Valid encounter within last 6 months    Recent Outpatient Visits           3 weeks ago Prostate cancer ([place])   [place], Jolene T, NP   6 months ago Dementia due to Alzheimer's disease ([place])   [facility] Mecum, Erin E, PA-C   9 months ago Dementia due to Alzheimer's disease ([place])   [facility] [name], [name], MD   10 months ago High prostate specific antigen (PSA)   [facility] [name], [name], MD   1 year ago Need for influenza vaccination   [place] [name], [name], MD       Future Appointments             In 2 months [place], Herbert Seta, MD [place]   In 5 months [place], Barbaraann Faster, NP [employer], [facility]

## 2022-09-19 ENCOUNTER — Other Ambulatory Visit: Payer: Self-pay | Admitting: Nurse Practitioner

## 2022-09-19 NOTE — Telephone Encounter (Signed)
Requested medications are due for refill today.  yes  Requested medications are on the active medications list.  yes  Last refill. 06/25/2022 #90 2IO  Future visit scheduled.   yes  Notes to clinic.  Missing labs.    Requested Prescriptions  Pending Prescriptions Disp Refills   calcitRIOL (ROCALTROL) 0.25 MCG capsule [Pharmacy Med Name: CALCITRIOL 0.25 MCG CAPSULE] 90 capsule 0    Sig: NEEDS APPOINTMENT FOR FURTHER REFILLS TAKE 1 CAPSULE BY MOUTH EVERY DAY     Endocrinology:  Vitamins - Vitamin D Supplementation - calcitriol Failed - 09/19/2022  1:39 AM      Failed - Phosphate in normal range and within 360 days    No results found for: "PHOS"       Failed - PTH in normal range and within 360 days    No results found for: "IOPTH", "PTHINTACTFNA", "PTH"       Failed - Ca in normal range and within 360 days    Calcium  Date Value Ref Range Status  08/06/2021 10.0 8.6 - 10.2 mg/dL Final         Passed - Valid encounter within last 12 months    Recent Outpatient Visits           1 month ago Prostate cancer ([place])   [place], Jolene T, NP   7 months ago Dementia due to Alzheimer's disease ([place])   [facility] Mecum, Erin E, PA-C   9 months ago Dementia due to Alzheimer's disease ([place])   [facility] [name], [name], MD   11 months ago High prostate specific antigen (PSA)   [facility] [name], [name], MD   1 year ago Need for influenza vaccination   [place] [name], [name], MD       Future Appointments             In 1 month Sninsky, Herbert Seta, MD [place]   In 5 months [place], Barbaraann Faster, NP [employer], [place]

## 2022-11-07 ENCOUNTER — Other Ambulatory Visit: Payer: Self-pay | Admitting: Nurse Practitioner

## 2022-11-07 NOTE — Telephone Encounter (Signed)
Requested medication (s) are due for refill today: yes  Requested medication (s) are on the active medication list: yes    Last refill: 04/02/22  #90  1 refill  Future visit scheduled yes  02/19/23  Notes to clinic:Failed due to labs, please review. Thank you.  Requested Prescriptions  Pending Prescriptions Disp Refills   colchicine 0.6 MG tablet [Pharmacy Med Name: COLCHICINE 0.6 MG TABLET] 90 tablet 1    Sig: TAKE 1 TABLET BY [place] DAY     Endocrinology:  Gout Agents - colchicine Failed - 11/07/2022  2:41 PM      Failed - Cr in normal range and within 360 days    Creatinine, Ser  Date Value Ref Range Status  08/06/2021 2.06 (H) 0.76 - 1.27 mg/dL Final         Failed - ALT in normal range and within 360 days    ALT  Date Value Ref Range Status  08/06/2021 29 0 - 44 IU/L Final         Failed - AST in normal range and within 360 days    AST  Date Value Ref Range Status  08/06/2021 40 0 - 40 IU/L Final         Failed - CBC within normal limits and completed in the last 12 months    WBC  Date Value Ref Range Status  02/06/2022 5.3 4.0 - 10.5 K/uL Final   RBC  Date Value Ref Range Status  02/06/2022 3.94 (L) 4.22 - 5.81 MIL/uL Final   Hemoglobin  Date Value Ref Range Status  02/06/2022 11.9 (L) 13.0 - 17.0 g/dL Final  08/06/2021 13.2 13.0 - 17.7 g/dL Final   HCT  Date Value Ref Range Status  02/06/2022 37.2 (L) 39.0 - 52.0 % Final   Hematocrit  Date Value Ref Range Status  08/06/2021 40.9 37.5 - 51.0 % Final   MCHC  Date Value Ref Range Status  02/06/2022 32.0 30.0 - 36.0 g/dL Final   [facility]  Date Value Ref Range Status  02/06/2022 30.2 26.0 - 34.0 pg Final   MCV  Date Value Ref Range Status  02/06/2022 94.4 80.0 - 100.0 fL Final  08/06/2021 93 79 - 97 fL Final   No results found for: "PLTCOUNTKUC", "LABPLAT", "POCPLA" RDW  Date Value Ref Range Status  02/06/2022 14.4 11.5 - 15.5 % Final  08/06/2021 12.2 11.6 - 15.4 % Final         Passed -  Valid encounter within last 12 months    Recent Outpatient Visits           2 months ago Prostate cancer ([place])   [place], Henrine Screws T, NP   8 months ago Dementia due to Alzheimer's disease ([place])   [place], Erin E, PA-C   11 months ago Dementia due to Alzheimer's disease ([place])   [place] [facility] [name], [name], MD   1 year ago High prostate specific antigen (PSA)   [place] [name], [name], MD   1 year ago Need for influenza vaccination   [place] [name], Loman [place], MD       Future Appointments             In 1 week [place], Herbert Seta, MD [place]   In 3 months [place], Barbaraann Faster, NP [place], [facility]

## 2022-11-07 NOTE — Telephone Encounter (Signed)
Requested Prescriptions  Pending Prescriptions Disp Refills   colchicine 0.6 MG tablet [Pharmacy Med Name: COLCHICINE 0.6 MG TABLET] 90 tablet 0    Sig: TAKE 1 TABLET BY MOUTH EVERY DAY     Endocrinology:  Gout Agents - colchicine Failed - 11/07/2022  2:41 PM      Failed - Cr in normal range and within 360 days    Creatinine, Ser  Date Value Ref Range Status  08/06/2021 2.06 (H) 0.76 - 1.27 mg/dL Final         Failed - ALT in normal range and within 360 days    ALT  Date Value Ref Range Status  08/06/2021 29 0 - 44 IU/L Final         Failed - AST in normal range and within 360 days    AST  Date Value Ref Range Status  08/06/2021 40 0 - 40 IU/L Final         Failed - CBC within normal limits and completed in the last 12 months    WBC  Date Value Ref Range Status  02/06/2022 5.3 4.0 - 10.5 K/uL Final   RBC  Date Value Ref Range Status  02/06/2022 3.94 (L) 4.22 - 5.81 MIL/uL Final   Hemoglobin  Date Value Ref Range Status  02/06/2022 11.9 (L) 13.0 - 17.0 g/dL Final  08/06/2021 13.2 13.0 - 17.7 g/dL Final   HCT  Date Value Ref Range Status  02/06/2022 37.2 (L) 39.0 - 52.0 % Final   Hematocrit  Date Value Ref Range Status  08/06/2021 40.9 37.5 - 51.0 % Final   MCHC  Date Value Ref Range Status  02/06/2022 32.0 30.0 - 36.0 g/dL Final   [facility]  Date Value Ref Range Status  02/06/2022 30.2 26.0 - 34.0 pg Final   MCV  Date Value Ref Range Status  02/06/2022 94.4 80.0 - 100.0 fL Final  08/06/2021 93 79 - 97 fL Final   No results found for: "PLTCOUNTKUC", "LABPLAT", "POCPLA" RDW  Date Value Ref Range Status  02/06/2022 14.4 11.5 - 15.5 % Final  08/06/2021 12.2 11.6 - 15.4 % Final         Passed - Valid encounter within last 12 months    Recent Outpatient Visits           2 months ago Prostate cancer ([place])   [place], Henrine Screws T, NP   8 months ago Dementia due to Alzheimer's disease ([place])   [place], Erin E, PA-C   11 months ago Dementia due to Alzheimer's disease ([place])   [place] [facility] [name], [name], MD   1 year ago High prostate specific antigen (PSA)   [place] [name], [name], MD   1 year ago Need for influenza vaccination   [place] [name], Loman [place], MD       Future Appointments             In 1 week [place], Herbert Seta, MD [place]   In 3 months [place], Barbaraann Faster, NP [place], [facility]

## 2022-11-07 NOTE — Telephone Encounter (Signed)
Pt's wife called to report that the patient is down to four pills. She says the patient cannot be without this medication, says it is urgent.

## 2022-11-11 ENCOUNTER — Other Ambulatory Visit: Payer: Federal, State, Local not specified - PPO

## 2022-11-11 DIAGNOSIS — C61 Malignant neoplasm of prostate: Secondary | ICD-10-CM

## 2022-11-12 LAB — PSA: Prostate Specific Ag, Serum: <0.1 ng/mL (ref 0.0–4.0)

## 2022-11-13 ENCOUNTER — Ambulatory Visit: Payer: Federal, State, Local not specified - PPO | Admitting: Physician Assistant

## 2022-11-13 NOTE — Progress Notes (Incomplete)
Assessment/Plan:   Memory Impairment Jason Estrada is a very pleasant 82 y.o. RH male seen today in follow up for memory loss. Patient is currently on . MRI brain personally reviewed was remarkable for  Patient is on memantine 10 mg twice daily and donepezil 10 mg daily, tolerating well.    Follow up in  6 months. Continue donepezil 10 mg daily and memantine 10 mg twice daily, side effects discussed Continue to control mood as per PCP Continue to control cardiovascular risk factors Recommend increased supervision, for safety.    Subjective:    This patient is accompanied in the office by his wife*** who supplements the history.  Previous records as well as any outside records available were reviewed prior to todays visit. Patient was last seen on ***   Any changes in memory since last visit?  He has difficulty retrieving words.  He also continues to have issues with time perception, for example if something happened 1 year ago he will say that it happened several years ago.  "Nothing occurs recently ".  Most of the day he watches TV, play solitaire on the phone, does not participate in any other activities except for reading.  She refuses attending adult day program. repeats oneself?  Endorsed Disoriented when walking into a room?  Patient denies except occasionally not remembering what patient came to the room for ***  Leaving objects in unusual places?    denies   Wandering behavior?  denies***he is obsessed with trash, likes to reorganize it all the time. Any personality changes since last visit?  denies   Any worsening depression?:  denies   Hallucinations or paranoia?  denies   Seizures?    denies    Any sleep changes?  Denies vivid dreams, REM behavior or sleepwalking   Sleep apnea?   denies   Any hygiene concerns?   He has to continue to be reminded to take a shower*** Independent of bathing and dressing?  Endorsed  Does the patient needs help with medications? i wife s in  charge *** Who is in charge of the finances?  Wife is in charge   *** Any changes in appetite?  denies ***   Patient have trouble swallowing?  denies   Does the patient cook?  Any kitchen accidents such as leaving the stove on? Patient denies   Any headaches?   denies   Chronic back pain  denies   Ambulates with difficulty?     denies   Recent falls or head injuries? denies     Unilateral weakness, numbness or tingling?    denies   Any tremors?  denies   Any anosmia?  Patient denies   Any incontinence of urine?  denies   Any bowel dysfunction?     History of chronic intermittent diarrhea.     Patient lives  ***with his wife Does the patient drive?***He no longer drives. Initial Evaluation 06/04/21 The patient is seen in neurologic consultation at the request of [name], [name], MD for the evaluation of memory.  The patient is accompanied by his wife who supplements the history. 89 y.o. year old male who has had memory issues for about 1 year when he noticed worsening short term memory deficiencies. "He pauses till he can find the word" -wife says.  He misses appointments frequently. He moved from [place] 1 year ago, and his wife would travel from there to [place] during this time, able to see these changes. "This was worse until 4  weeks ago", somehow it is a little bit better but not much". He does repeat the same stories and questions. He denies depression or irritability. His mood is "good". He  likes to play solitaire on his phone, trivia, reading and walking. He sleeps better than when first moving, He has some vivid dreams without  sleepwalking. At times he confuses the days and nights. "He wanted to eat breakfast at midnight".  In July he was confused, found to have elevated PSA and was referred to a urologist, workup ongoing. He has some urine hesitancy because of prostatic issues. Currently denies hallucinations or paranoia. Denies leaving objects in unusual places.   Wife reports issues  with hygiene."Bathing since around July, is a battle, and he likes to use the same underwear". Wife administers the medications and manages the bills, because he fell victim to scams. His appetite is good and denies trouble swallowing, He cooks and denies leaving the stove on. Wife supervises . He ambulates without devices, but is prone to falls due to gout. Denies head injuries.    He drives with GPS and wife is concerned because he relies on it and "still gets lost, took me to the wrong place the other day". He has issues with time gap as well. He believes that he has been in [place] longer, no sense of when and where he is".  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 Recently has  worked for [employer]. Denies headaches, double vision, dizziness, focal numbness or tingling, unilateral weakness or tremors.  No constipation or diarrhea. Denies anosmia. Denies history of OSA, ETOH or Tobacco. Family History negative for dementia.   Labs on 04/22/2021 CBC with hemoglobin 12.3/hematocrit 37.9 (in the setting of CKD) otherwise normal Creatinine 2.7 (CKD) Uric acid 9 (gout) B12 427 Folate RPR TSH 0.905 T4 1.71 RPR non reactive PSA 41.1 (has been referred to urology)  Lipid panel normal     Neuropsychological evaluation Dr. Melvyn Novas 10/11/21 evaluation on 10/03/2021. Please refer to that encounter for the full report and recommendations. Briefly, results suggested severe impairment across all aspects of learning and memory. Additional impairment was exhibited across semantic fluency and an isolated line orientation task, while performance variability was exhibited across executive functioning. Regarding etiology, I have notable concerns surrounding the presence of Alzheimer's disease and feel that this represents the most likely culprit for ongoing cognitive decline. [name] did not benefit from repeated exposure to information across learning trials, was essentially amnestic  across all memory tasks after a short delay, and generally performed below expectation across yes/no recognition tasks. All of which suggests the presence of rapid forgetting and an evolving and already fairly significant memory storage deficit, both of which are hallmark characteristics of Alzheimer's disease. Further impairment in semantic fluency is consistent with this disease process and its normal progression. His wife's reporting of him rapidly forgetting information and repeating himself often also aligns well with Alzheimer's disease.    PREVIOUS MEDICATIONS:   CURRENT MEDICATIONS:  Outpatient Encounter Medications as of 11/13/2022  Medication Sig   baclofen (LIORESAL) 10 MG tablet TAKE 1 TABLET BY MOUTH EVERY DAY AS NEEDED   allopurinol (ZYLOPRIM) 100 MG tablet Take 1 tablet (100 mg total) by mouth daily.   calcitRIOL (ROCALTROL) 0.25 MCG capsule TAKE 1 CAPSULE BY MOUTH EVERY DAY   colchicine 0.6 MG tablet TAKE 1 TABLET BY MOUTH EVERY DAY   donepezil (ARICEPT) 10 MG tablet Take 1 tablet at 10 mg  daily   folic acid (FOLVITE) 1 MG tablet Take 1 tablet (1 mg total) by mouth daily.   hydrALAZINE (APRESOLINE) 50 MG tablet Take 0.5 tablets (25 mg total) by mouth in the morning and at bedtime.   memantine (NAMENDA) 10 MG tablet TAKE 1 TABLET (10 MG AT NIGHT) FOR 2 WEEKS, THEN INCREASE TO 1 TABLET (10 MG) TWICE A DAY   pantoprazole (PROTONIX) 20 MG tablet Take 1 tablet (20 mg total) by mouth daily.   pravastatin (PRAVACHOL) 40 MG tablet Take 1 tablet (40 mg total) by mouth daily.   verapamil (CALAN-SR) 240 MG CR tablet Take 1 tablet (240 mg total) by mouth daily.   No facility-administered encounter medications on file as of 11/13/2022.       05/14/2022    7:00 AM  MMSE - Mini Mental State Exam  Orientation to time 0  Orientation to Place 2  Registration 3  Attention/ Calculation 4  Recall 0  Language- name 2 objects 2  Language- repeat 1  Language- follow 3 step command 2   Language- read & follow direction 1  Write a sentence 0  Copy design 1  Total score 16      06/01/2021    9:00 AM  Montreal Cognitive Assessment   Visuospatial/ Executive (0/5) 4  Naming (0/3) 3  Attention: Read list of digits (0/2) 2  Attention: Read list of letters (0/1) 0  Attention: Serial 7 subtraction starting at 100 (0/3) 1  Language: Repeat phrase (0/2) 2  Language : Fluency (0/1) 0  Abstraction (0/2) 0  Delayed Recall (0/5) 0  Orientation (0/6) 4  Total 16  Adjusted Score (based on education) 17    Objective:     PHYSICAL EXAMINATION:    VITALS:  There were no vitals filed for this visit.  GEN:  The patient appears stated age and is in NAD. HEENT:  Normocephalic, atraumatic.   Neurological examination:  General: NAD, well-groomed, appears stated age. Orientation: The patient is alert. Oriented to person, place and date Cranial nerves: There is good facial symmetry.The speech is fluent and clear. No aphasia or dysarthria. Fund of knowledge is appropriate. Recent and remote memory are impaired. Attention and concentration are reduced.  Able to name objects and repeat phrases.  Hearing is intact to conversational tone.    Sensation: Sensation is intact to light touch throughout Motor: Strength is at least antigravity x4. DTR's 2/4 in UE/LE     Movement examination: Tone: There is normal tone in the UE/LE Abnormal movements:  no tremor.  No myoclonus.  No asterixis.   Coordination:  There is no decremation with RAM's. Normal finger to nose  Gait and Station: The patient has no difficulty arising out of a deep-seated chair without the use of the hands. The patient's stride length is good.  Gait is cautious and narrow.    Thank you for allowing [place] the opportunity to participate in the care of this nice patient. Please do not hesitate to contact us for any questions or concerns.   Total time spent on today's visit was *** minutes dedicated to this patient today,  preparing to see patient, examining the patient, ordering tests and/or medications and counseling the patient, documenting clinical information in the EHR or other health record, independently interpreting results and communicating results to the patient/family, discussing treatment and goals, answering patient's questions and coordinating care.  Cc:  Venita Lick, NP  Sharene Butters 11/13/2022 7:41 AM

## 2022-11-14 ENCOUNTER — Ambulatory Visit: Payer: Federal, State, Local not specified - PPO | Admitting: Urology

## 2022-11-14 VITALS — BP 146/71 | HR 80 | Ht 72.0 in | Wt 192.0 lb

## 2022-11-14 DIAGNOSIS — C61 Malignant neoplasm of prostate: Secondary | ICD-10-CM

## 2022-11-14 MED ORDER — LEUPROLIDE ACETATE (6 MONTH) 45 MG ~~LOC~~ KIT
45.0000 mg | PACK | Freq: Once | SUBCUTANEOUS | Status: AC
  Administered 2022-11-14: 45 mg via SUBCUTANEOUS

## 2022-11-14 MED ORDER — ELIGARD 45 MG ~~LOC~~ KIT: 45.0000 mg | PACK | SUBCUTANEOUS | 1 refills | Status: DC

## 2022-11-14 NOTE — Progress Notes (Signed)
Eligard SubQ Injection   Due to Prostate Cancer patient is present today for a Eligard Injection.  Medication: Eligard 6 month Dose: 45 mg  Location: left arm Lot: [id number] Exp: 10/2023  Patient tolerated well, no complications were noted.  Performed by: Gordy Clement, Wiggins   Per Dr. Diamantina Providence patient is to continue therapy for 6 months . Patient's next follow up was scheduled for August 29th, 2024. This appointment was scheduled using wheel and given to patient today along with reminder continue on Vitamin D 800-1000iu and Calcium 1000-'1200mg'$  daily while on Androgen Deprivation Therapy.  PA approval dates: No PA required.

## 2022-11-14 NOTE — Progress Notes (Signed)
   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  He underwent a prostate MRI that showed a 38 g prostate with a PI-RADS 5 lesion in the right anterior mid gland tracking towards the base with suspected early extracapsular disease along the right anterior lateral margin of the prostate, as well as a PI-RADS 4 lesion at the left prostate at the margin of the transition zone.  There was no lymphadenopathy or bony lesion in the pelvis.  He underwent a cognitive MRI biopsy that showed intermediate risk disease with Gleason score 3+4=7 disease.  PSMA PET scan showed no evidence of metastatic disease.  Using shared decision making, he opted for 2 years of ADT with external beam radiation.  This was completed in June 2023, and initial ADT injection was given 12/10/2021.  He did very well with radiation and really denies any side effects.  He had some mild fatigue on the ADT but overall doing well.  He denies any urinary complaints.  PSA 11/11/2022 remains undetectable.  We discussed the need to continue ADT for 2 years, and wants of monitoring the PSA trend, and possible need for additional treatments in the future.  5-monthADT injection given today(2 years total, last dose at next visit in August 2024) RTC 6 months PSA prior   BBilley Co MD  [id number] [place] [phone]

## 2022-11-16 ENCOUNTER — Other Ambulatory Visit: Payer: Self-pay | Admitting: Nurse Practitioner

## 2022-11-18 NOTE — Telephone Encounter (Signed)
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

## 2022-11-26 ENCOUNTER — Ambulatory Visit: Payer: Federal, State, Local not specified - PPO | Admitting: Physician Assistant

## 2022-11-26 ENCOUNTER — Encounter: Payer: Self-pay | Admitting: Physician Assistant

## 2022-11-26 VITALS — BP 145/76 | HR 92 | Resp 18 | Ht 72.0 in | Wt 199.0 lb

## 2022-11-26 DIAGNOSIS — G309 Alzheimer's disease, unspecified: Secondary | ICD-10-CM | POA: Diagnosis not present

## 2022-11-26 DIAGNOSIS — R413 Other amnesia: Secondary | ICD-10-CM | POA: Diagnosis not present

## 2022-11-26 DIAGNOSIS — F0280 Dementia in other diseases classified elsewhere without behavioral disturbance: Secondary | ICD-10-CM | POA: Diagnosis not present

## 2022-11-26 MED ORDER — MEMANTINE HCL 10 MG PO TABS: ORAL_TABLET | ORAL | 3 refills | Status: DC

## 2022-11-26 NOTE — Progress Notes (Signed)
Assessment/Plan:   Dementia likely due to Alzheimer's disease  Jason Estrada is a very pleasant 82 y.o. 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 Patient is currently on memantine 10 mg twice daily and donepezil 10 mg daily. His memory is stable. Today's MMSE is 25/30, improved from his visit in 04/2022 at 16/30. He is able to participate in same ADLs as before, he no longer drives. He does not wish to attend a Day Program.     Follow up in 6 months. Continue donepezil 10 mg daily and memantine 10 mg twice daily, side effects discussed. Recommend good control of cardiovascular risk factors Recommend mood control as per PCP Repeat Neuropsych testing for clarity of diagnosis and disease trajectory      Subjective:    This patient is accompanied in the office by his wife who supplements the history.  Previous records as well as any outside records available were reviewed prior to todays visit. Patient was last seen on 05/13/2022, at which time his MMSE was 16/30.    Any changes in memory since last visit?  "About the same" he reports. He may have at times has more difficulty retrieving words, needs longer time to so. He also has trouble with time as on his prior visit, "he does not know if something happened 1 year ago or 3 years ago ".  Most of the day he watches TV, play solitaire on the phone, does not participate in other activities except for reading.  He does not wish to attend adult day program. repeats oneself?  Endorsed by his wife. Disoriented when walking into a room?  Patient denies  Leaving objects in unusual places?  denies   Wandering behavior?  denies   Any personality changes since last visit?  "A little frustration when he cannot come up with the word" Any depression?:  denies   Hallucinations or paranoia?  denies   Seizures?    denies    Any  sleep changes? He reports  vivid dreams, REM behavior or sleepwalking   Sleep apnea?   denies   Any hygiene concerns?  Endorsed, she has to guide him to take a shower.  He likes to use the same underwear. "It's a battle" Independent of bathing and dressing?  Endorsed  Does the patient needs help with medications?  Wife is in charge   Who is in charge of the finances?  Wife is in charge     Any changes in appetite?   He"craves junk"     Patient have trouble swallowing?  denies   Does the patient cook?  Any kitchen accidents such as leaving the stove on? Patient denies   Any headaches?   denies   Chronic back pain  denies   Ambulates with difficulty?  He has a history of gout and arthritis of the right knee, he is prone to falls because of that according to his wife. Doing PT, they gave him a brace but he does not wear it. Took a cortisone shot which seemed to help.  Recent falls or head injuries? denies     Unilateral weakness, numbness or tingling? denies   Any tremors?  "A tiny bit, not worse from before" Any anosmia?  Patient denies   Any incontinence of urine?  He has urinary hesitancy. He was diagnosed with prostate cancer and finished radiation therapy successfully. Any  bowel dysfunction?  Lose stools, chronic Patient lives   with his wife Does the patient drive? He no longer drives   Initial Evaluation 06/04/21 The patient is seen in neurologic consultation at the request of [name], [name], MD for the evaluation of memory.  The patient is accompanied by his wife who supplements the history. 28 y.o. year old male who has had memory issues for about 1 year when he noticed worsening short term memory deficiencies. "He pauses till he can find the word" -wife says.  He misses appointments frequently. He moved from [place] 1 year ago, and his wife would travel from there to [place] during this time, able to see these changes. "This was worse until 4 weeks ago", somehow it is a little bit better  but not much". He does repeat the same stories and questions. He denies depression or irritability. His mood is "good". He  likes to play solitaire on his phone, trivia, reading and walking. He sleeps better than when first moving, He has some vivid dreams without  sleepwalking. At times he confuses the days and nights. "He wanted to eat breakfast at midnight".  In July he was confused, found to have elevated PSA and was referred to a urologist, workup ongoing. He has some urine hesitancy because of prostatic issues. Currently denies hallucinations or paranoia. Denies leaving objects in unusual places.   Wife reports issues with hygiene."Bathing since around July, is a battle, and he likes to use the same underwear". Wife administers the medications and manages the bills, because he fell victim to scams. His appetite is good and denies trouble swallowing, He cooks and denies leaving the stove on. Wife supervises . He ambulates without devices, but is prone to falls due to gout. Denies head injuries.    He drives with GPS and wife is concerned because he relies on it and "still gets lost, took me to the wrong place the other day". He has issues with time gap as well. He believes that he has been in [place] longer, no sense of when and where he is".  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 Recently has  worked for [employer]. Denies headaches, double vision, dizziness, focal numbness or tingling, unilateral weakness or tremors.  No constipation or diarrhea. Denies anosmia. Denies history of OSA, ETOH or Tobacco. Family History negative for dementia.   Labs on 04/22/2021 CBC with hemoglobin 12.3/hematocrit 37.9 (in the setting of CKD) otherwise normal Creatinine 2.7 (CKD) Uric acid 9 (gout) B12 427 Folate RPR TSH 0.905 T4 1.71 RPR non reactive PSA 41.1 (has been referred to urology)  Lipid panel normal     Neuropsychological evaluation Dr. Melvyn Novas 10/11/21 evaluation on  10/03/2021. Please refer to that encounter for the full report and recommendations. Briefly, results suggested severe impairment across all aspects of learning and memory. Additional impairment was exhibited across semantic fluency and an isolated line orientation task, while performance variability was exhibited across executive functioning. Regarding etiology, I have notable concerns surrounding the presence of Alzheimer's disease and feel that this represents the most likely culprit for ongoing cognitive decline. Mr. Spampinato did not benefit from repeated exposure to information across learning trials, was essentially amnestic across all memory tasks after a short delay, and generally performed below expectation across yes/no recognition tasks. All of which suggests the presence of rapid forgetting and an evolving and already fairly significant memory storage deficit, both of which are hallmark characteristics of Alzheimer's disease. Further  impairment in semantic fluency is consistent with this disease process and its normal progression. His wife's reporting of him rapidly forgetting information and repeating himself often also aligns well with Alzheimer's disease.    PREVIOUS MEDICATIONS:   CURRENT MEDICATIONS:  Outpatient Encounter Medications as of 11/26/2022  Medication Sig   allopurinol (ZYLOPRIM) 100 MG tablet Take 1 tablet (100 mg total) by mouth daily.   baclofen (LIORESAL) 10 MG tablet TAKE 1 TABLET BY MOUTH EVERY DAY AS NEEDED   calcitRIOL (ROCALTROL) 0.25 MCG capsule TAKE 1 CAPSULE BY MOUTH EVERY DAY   colchicine 0.6 MG tablet TAKE 1 TABLET BY MOUTH EVERY DAY   donepezil (ARICEPT) 10 MG tablet Take 1 tablet at 10 mg daily   folic acid (FOLVITE) 1 MG tablet TAKE 1 TABLET BY MOUTH EVERY DAY   hydrALAZINE (APRESOLINE) 50 MG tablet Take 0.5 tablets (25 mg total) by mouth in the morning and at bedtime.   pantoprazole (PROTONIX) 20 MG tablet Take 1 tablet (20 mg total) by mouth daily.    pravastatin (PRAVACHOL) 40 MG tablet Take 1 tablet (40 mg total) by mouth daily.   verapamil (CALAN-SR) 240 MG CR tablet Take 1 tablet (240 mg total) by mouth daily.   [DISCONTINUED] memantine (NAMENDA) 10 MG tablet TAKE 1 TABLET (10 MG AT NIGHT) FOR 2 WEEKS, THEN INCREASE TO 1 TABLET (10 MG) TWICE A DAY   memantine (NAMENDA) 10 MG tablet Take 1 tab twice a day   No facility-administered encounter medications on file as of 11/26/2022.       11/26/2022    4:00 PM 05/14/2022    7:00 AM  MMSE - Mini Mental State Exam  Orientation to time 4 0  Orientation to Place 3 2  Registration 3 3  Attention/ Calculation 4 4  Recall 2 0  Language- name 2 objects 2 2  Language- repeat 1 1  Language- follow 3 step command 3 2  Language- read & follow direction 1 1  Write a sentence 1 0  Copy design 1 1  Total score 25 16      06/01/2021    9:00 AM  Montreal Cognitive Assessment   Visuospatial/ Executive (0/5) 4  Naming (0/3) 3  Attention: Read list of digits (0/2) 2  Attention: Read list of letters (0/1) 0  Attention: Serial 7 subtraction starting at 100 (0/3) 1  Language: Repeat phrase (0/2) 2  Language : Fluency (0/1) 0  Abstraction (0/2) 0  Delayed Recall (0/5) 0  Orientation (0/6) 4  Total 16  Adjusted Score (based on education) 17    Objective:     PHYSICAL EXAMINATION:    VITALS:   Vitals:   11/26/22 1445  BP: (!) 145/76  Pulse: 92  Resp: 18  SpO2: 97%  Weight: 199 lb (90.3 kg)  Height: 6' (1.829 m)    GEN:  The patient appears stated age and is in NAD. HEENT:  Normocephalic, atraumatic.   Neurological examination:  General: NAD, well-groomed, appears stated age. Orientation: The patient is alert. Oriented to person, has difficulty with place and date Cranial nerves: There is good facial symmetry.The speech is fluent and clear. No aphasia or dysarthria. Fund of knowledge is appropriate. Recent and remote memory are impaired. Attention and concentration are reduced.   Able to name objects and repeat phrases.  Hearing is intact to conversational tone.   Sensation: Sensation is intact to light touch throughout Motor: Strength is at least antigravity x4. DTR's 2/4 in UE/LE  Movement examination: Tone: There is normal tone in the UE/LE Abnormal movements: very mild resting tremor, and intention tremor bilaterally.No myoclonus.  No asterixis.   Coordination:  There is no decremation with RAM's. Normal finger to nose  Gait and Station: The patient has no difficulty arising out of a deep-seated chair without the use of the hands. The patient's stride length is good.  Gait is cautious and narrow.    Thank you for allowing [place] the opportunity to participate in the care of this nice patient. Please do not hesitate to contact us for any questions or concerns.   Total time spent on today's visit was 30 minutes dedicated to this patient today, preparing to see patient, examining the patient, ordering tests and/or medications and counseling the patient, documenting clinical information in the EHR or other health record, independently interpreting results and communicating results to the patient/family, discussing treatment and goals, answering patient's questions and coordinating care.  Cc:  Venita Lick, NP  Sharene Butters 11/26/2022 4:38 PM

## 2022-11-26 NOTE — Patient Instructions (Signed)
It was a pleasure to see you today at our office.   Recommendations:  Follow up in  6 months Continue Donepezil 10 mg daily   Memantine '10mg'$  tablets.  Take 1 tablet  twice daily.    Repeat neuropsychology testing     Whom to call:  Memory  decline, memory medications: Call our office [phone]   For psychiatric meds, mood meds: Please have your primary care physician manage these medications.   Counseling regarding caregiver distress, including caregiver depression, anxiety and issues regarding community resources, adult day care programs, adult living facilities, or memory care questions:   Feel free to contact Aneta, Social Worker at [phone]   For assessment of decision of mental capacity and competency:  Call Dr. Anthoney Harada, geriatric psychiatrist at [phone]  For guidance in geriatric dementia issues please call [facility] [phone]  For guidance regarding [facility] and if placement were needed at the facility, contact Arnell Asal, Social Worker tel: [phone]  If you have any severe symptoms of a stroke, or other severe issues such as confusion,severe chills or fever, etc call 911 or go to the ER as you may need to be evaluated further   Feel free to visit Facebook page " [email or web address]" for tips of how to care for people with memory problems.       RECOMMENDATIONS FOR ALL PATIENTS WITH MEMORY PROBLEMS: 1. Continue to exercise (Recommend 30 minutes of walking everyday, or 3 hours every week) 2. Increase social interactions - continue going to [place] and enjoy social gatherings with friends and family 3. Eat healthy, avoid fried foods and eat more fruits and vegetables 4. Maintain adequate blood pressure, blood sugar, and blood cholesterol level. Reducing the risk of stroke and cardiovascular disease also helps promoting better memory. 5. Avoid stressful situations. Live a simple life and avoid aggravations.  Organize your time and prepare for the next day in anticipation. 6. Sleep well, avoid any interruptions of sleep and avoid any distractions in the bedroom that may interfere with adequate sleep quality 7. Avoid sugar, avoid sweets as there is a strong link between excessive sugar intake, diabetes, and cognitive impairment We discussed the Mediterranean diet, which has been shown to help patients reduce the risk of progressive memory disorders and reduces cardiovascular risk. This includes eating fish, eat fruits and green leafy vegetables, nuts like almonds and hazelnuts, walnuts, and also use olive oil. Avoid fast foods and fried foods as much as possible. Avoid sweets and sugar as sugar use has been linked to worsening of memory function.  There is always a concern of gradual progression of memory problems. If this is the case, then we may need to adjust level of care according to patient needs. Support, both to the patient and caregiver, should then be put into place.    The Alzheimer's Association is here all day, every day for people facing Alzheimer's disease through our free 24/7 Helpline: [phone]. The Helpline provides reliable information and support to all those who need assistance, such as individuals living with memory loss, Alzheimer's or other dementia, caregivers, health care professionals and the public.  Our highly trained and knowledgeable staff can help you with: Understanding memory loss, dementia and Alzheimer's  Medications and other treatment options  General information about aging and brain health  Skills to provide quality care and to find the best care from professionals  Legal, financial and living-arrangement decisions Our Helpline also features: Confidential care consultation provided  by master's level clinicians who can help with decision-making support, crisis assistance and education on issues families face every day  Help in a caller's preferred language using  our translation service that features more than 200 languages and dialects  Referrals to local community programs, services and ongoing support     FALL PRECAUTIONS: Be cautious when walking. Scan the area for obstacles that may increase the risk of trips and falls. When getting up in the mornings, sit up at the edge of the bed for a few minutes before getting out of bed. Consider elevating the bed at the head end to avoid drop of blood pressure when getting up. Walk always in a well-lit room (use night lights in the walls). Avoid area rugs or power cords from appliances in the middle of the walkways. Use a walker or a cane if necessary and consider physical therapy for balance exercise. Get your eyesight checked regularly.  FINANCIAL OVERSIGHT: Supervision, especially oversight when making financial decisions or transactions is also recommended.  HOME SAFETY: Consider the safety of the kitchen when operating appliances like stoves, microwave oven, and blender. Consider having supervision and share cooking responsibilities until no longer able to participate in those. Accidents with firearms and other hazards in the house should be identified and addressed as well.   ABILITY TO BE LEFT ALONE: If patient is unable to contact 911 operator, consider using LifeLine, or when the need is there, arrange for someone to stay with patients. Smoking is a fire hazard, consider supervision or cessation. Risk of wandering should be assessed by caregiver and if detected at any point, supervision and safe proof recommendations should be instituted.  MEDICATION SUPERVISION: Inability to self-administer medication needs to be constantly addressed. Implement a mechanism to ensure safe administration of the medications.   DRIVING: Regarding driving, in patients with progressive memory problems, driving will be impaired. We advise to have someone else do the driving if trouble finding directions or if minor accidents  are reported. Independent driving assessment is available to determine safety of driving.   If you are interested in the driving assessment, you can contact the following:  [employer] in [place]  [place] [phone] or [phone]      [place] refers to food and lifestyle choices that are based on the traditions of countries located on the [employer]. This way of eating has been shown to help prevent certain conditions and improve outcomes for people who have chronic diseases, like kidney disease and heart disease. What are tips for following this plan? Lifestyle  Cook and eat meals together with your family, when possible. Drink enough fluid to keep your urine clear or pale yellow. Be physically active every day. This includes: Aerobic exercise like running or swimming. Leisure activities like gardening, walking, or housework. Get 7-8 hours of sleep each night. If recommended by your health care provider, drink red wine in moderation. This means 1 glass a day for nonpregnant women and 2 glasses a day for men. A glass of wine equals 5 oz (150 mL). Reading food labels  Check the serving size of packaged foods. For foods such as rice and pasta, the serving size refers to the amount of cooked product, not dry. Check the total fat in packaged foods. Avoid foods that have saturated fat or trans fats. Check the ingredients list for added sugars, such as corn syrup. Shopping  At the grocery store, buy most of your food from the areas near the walls of the store. This includes: Fresh fruits and vegetables (produce). Grains, beans, nuts, and seeds. Some of these may be available in unpackaged forms or large amounts (in bulk). Fresh seafood. Poultry and eggs. Low-fat dairy products. Buy whole ingredients instead of prepackaged  foods. Buy fresh fruits and vegetables in-season from local farmers markets. Buy frozen fruits and vegetables in resealable bags. If you do not have access to quality fresh seafood, buy precooked frozen shrimp or canned fish, such as tuna, salmon, or sardines. Buy small amounts of raw or cooked vegetables, salads, or olives from the deli or salad bar at your store. Stock your pantry so you always have certain foods on hand, such as olive oil, canned tuna, canned tomatoes, rice, pasta, and beans. Cooking  Cook foods with extra-virgin olive oil instead of using butter or other vegetable oils. Have meat as a side dish, and have vegetables or grains as your main dish. This means having meat in small portions or adding small amounts of meat to foods like pasta or stew. Use beans or vegetables instead of meat in common dishes like chili or lasagna. Experiment with different cooking methods. Try roasting or broiling vegetables instead of steaming or sauteing them. Add frozen vegetables to soups, stews, pasta, or rice. Add nuts or seeds for added healthy fat at each meal. You can add these to yogurt, salads, or vegetable dishes. Marinate fish or vegetables using olive oil, lemon juice, garlic, and fresh herbs. Meal planning  Plan to eat 1 vegetarian meal one day each week. Try to work up to 2 vegetarian meals, if possible. Eat seafood 2 or more times a week. Have healthy snacks readily available, such as: Vegetable sticks with hummus. Greek yogurt. Fruit and nut trail mix. Eat balanced meals throughout the week. This includes: Fruit: 2-3 servings a day Vegetables: 4-5 servings a day Low-fat dairy: 2 servings a day Fish, poultry, or lean meat: 1 serving a day Beans and legumes: 2 or more servings a week Nuts and seeds: 1-2 servings a day Whole grains: 6-8 servings a day Extra-virgin olive oil: 3-4 servings a day Limit red meat and sweets to only a few servings a month What are my food  choices? Mediterranean diet Recommended Grains: Whole-grain pasta. Brown rice. Bulgar wheat. Polenta. Couscous. Whole-wheat bread. [place]. Vegetables: Artichokes. Beets. Broccoli. Cabbage. Carrots. Eggplant. Green beans. Chard. Kale. Spinach. Onions. Leeks. Peas. Squash. Tomatoes. Peppers. Radishes. Fruits: Apples. Apricots. Avocado. Berries. Bananas. Cherries. Dates. Figs. Grapes. Lemons. Melon. Oranges. Peaches. Plums. Pomegranate. Meats and other protein foods: Beans. Almonds. Sunflower seeds. Pine nuts. Peanuts. [place]. Salmon. Scallops. Shrimp. [place]. Tilapia. Clams. Oysters. Eggs. Dairy: Low-fat milk. Cheese. Greek yogurt. Beverages: Water. Red wine. Herbal tea. Fats and oils: Extra virgin olive oil. Avocado oil. Grape seed oil. Sweets and desserts: [place] yogurt with honey. Baked apples. Poached pears. Trail mix. Seasoning and other foods: Basil. Cilantro. Coriander. Cumin. Mint. Parsley. Sage. Rosemary. Tarragon. Garlic. Oregano. Thyme. Pepper. Balsalmic vinegar. Tahini. Hummus. Tomato sauce. Olives. Mushrooms. Limit these Grains: Prepackaged pasta or rice dishes. Prepackaged cereal with added sugar. Vegetables: Deep fried potatoes (french fries). Fruits: Fruit canned in syrup. Meats and other protein foods: Beef. Pork. Lamb. Poultry with skin. Hot dogs. Bernie[place] Salines. Dairy: Ice cream. Sour cream. Whole milk. Beverages: Juice. Sugar-sweetened soft drinks. Beer. Liquor and spirits. Fats and oils: Butter. Canola oil. Vegetable oil. Beef fat (tallow). Lard. Sweets and desserts: Cookies.  Cakes. Pies. Candy. Seasoning and other foods: Mayonnaise. Premade sauces and marinades. The items listed may not be a complete list. Talk with your dietitian about what dietary choices are right for you. Summary The Mediterranean diet includes both food and lifestyle choices. Eat a variety of fresh fruits and vegetables, beans, nuts, seeds, and whole grains. Limit the amount of red meat and sweets that  you eat. Talk with your health care provider about whether it is safe for you to drink red wine in moderation. This means 1 glass a day for nonpregnant women and 2 glasses a day for men. A glass of wine equals 5 oz (150 mL). This information is not intended to replace advice given to you by your health care provider. Make sure you discuss any questions you have with your health care provider. Document Released: 04/25/2016 Document Revised: 05/28/2016 Document Reviewed: 04/25/2016 Elsevier Interactive Patient Education  2017 [employer].

## 2023-01-21 ENCOUNTER — Encounter: Payer: Self-pay | Admitting: Physician Assistant

## 2023-01-21 ENCOUNTER — Ambulatory Visit: Payer: Federal, State, Local not specified - PPO | Admitting: Physician Assistant

## 2023-01-21 VITALS — BP 120/54 | HR 74 | Ht 72.0 in | Wt 204.0 lb

## 2023-01-21 DIAGNOSIS — F0280 Dementia in other diseases classified elsewhere without behavioral disturbance: Secondary | ICD-10-CM | POA: Diagnosis not present

## 2023-01-21 DIAGNOSIS — G309 Alzheimer's disease, unspecified: Secondary | ICD-10-CM | POA: Diagnosis not present

## 2023-01-21 NOTE — Progress Notes (Signed)
Assessment/Plan:   Dementia likely due to Alzheimer's Disease  Jason Estrada is a very pleasant 82 y.o. 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 Patient is currently on memantine 10 mg bid and donepezil 10 mg daily. He is able to participate on some ADLS however, he is here prior to his scheduled appointment because he wants to discuss resuming his driving.  Despite recent improvement in his MMSE, the patient as explained below, has taken the car and almost drove to [place] when he was to drive a simple air run within 3 months.  It was explained to him the necessity of discontinuing driving, agree with his wife to hold in the keys, and recommended that she discuss with the DMV revoking his driver's license given the fact that he is a danger not only to himself or to others. Patient has poor insight into his condition.  Follow up in 6 months. Continue donepezil 10 mg daily. Side effects were discussed  Continue Memantine 10 mg twice daily. Side effects were discussed  Patient is scheduled for Neuropsych evaluation for clarity of the diagnosis and disease trajectory. No further driving. Recommend good control of her cardiovascular risk factors Continue to control mood as per PCP    Subjective:    This patient is accompanied in the office by his wife who supplements the history.  Previous records as well as any outside records available were reviewed prior to todays visit. Patient was last seen on 11/26/22 at which time his MMSE was 25/30      Any changes in memory since last visit? He may have some difficulty retrieving words, needing more time.  His LTM is affected as well. Most of the day he watches TV, plays solitaire on th phone, and reads.  Until recently, he had caregivers at least once a week, which stopped for a while, and his wife is in the process of  hiring a new caregiver.  Patient is not interested in adult day programs.    repeats oneself?  Endorsed by his wife. Disoriented when walking into a room?  Patient denies     Leaving objects in unusual places?    denies   Wandering behavior?  denies   Any personality changes since last visit? He is frustrated about not being able to drive    Any worsening depression?:  denies   Hallucinations or paranoia?  denies   Seizures?    denies    Any sleep changes?  Denies vivid dreams, REM behavior or sleepwalking   Sleep apnea?   denies   Any hygiene concerns?  He has to be guided to shower, or to change his underwear.  Independent of bathing and dressing?  Endorsed  Does the patient needs help with medications? Wife is in charge   Who is in charge of the finances? Wife  is in charge     Any changes in appetite?  Craves junk food    Patient have trouble swallowing?  denies   Does the patient cook?  Any kitchen accidents such as leaving the stove on? Patient denies   Any headaches?   denies   Chronic back pain  denies   Ambulates with difficulty?   Due to arthritis and gout his mobility is limited. He is doing PT and cortisone shots. He does not want to wear a brace . He is  using the cane now for stability  Recent falls or head injuries? denies     Unilateral weakness, numbness or tingling?    denies   Any tremors?  Minimal. Unchanged from prior   Any anosmia?  Patient denies   Any incontinence of urine?  denies   Any bowel dysfunction? Denies  Patient lives  with his wife   Does the patient drive?  Last Thursday, the patient took the car to go to the bank which is within 3 months from the house.  Instead, his wife was monitoring his traveling via GPS, noticed that he was driving further [place].  She called him several times on the phone and he told her that he was on his way home, however, she noticed that he took [address] at that time.  He continued to drive [place], to [facility], and drove  for about 12 hours.  His wife called his son on, who was able to get him after he ran out of gas 20 miles of [place].  "I wanted to prove that I can drive ".   Since then, his wife removed the keys, and is to have an appointment with DMV for revocation of his driver's license.  Initial Evaluation 06/04/21 The patient is seen in neurologic consultation at the request of [name], [name], MD for the evaluation of memory.  The patient is accompanied by his wife who supplements the history. 13 y.o. year old male who has had memory issues for about 1 year when he noticed worsening short term memory deficiencies. "He pauses till he can find the word" -wife says.  He misses appointments frequently. He moved from [place] 1 year ago, and his wife would travel from there to [place] during this time, able to see these changes. "This was worse until 4 weeks ago", somehow it is a little bit better but not much". He does repeat the same stories and questions. He denies depression or irritability. His mood is "good". He  likes to play solitaire on his phone, trivia, reading and walking. He sleeps better than when first moving, He has some vivid dreams without  sleepwalking. At times he confuses the days and nights. "He wanted to eat breakfast at midnight".  In July he was confused, found to have elevated PSA and was referred to a urologist, workup ongoing. He has some urine hesitancy because of prostatic issues. Currently denies hallucinations or paranoia. Denies leaving objects in unusual places.   Wife reports issues with hygiene."Bathing since around July, is a battle, and he likes to use the same underwear". Wife administers the medications and manages the bills, because he fell victim to scams. His appetite is good and denies trouble swallowing, He cooks and denies leaving the stove on. Wife supervises . He ambulates without devices, but is prone to falls due to gout. Denies head injuries.    He drives with GPS and wife is  concerned because he relies on it and "still gets lost, took me to the wrong place the other day". He has issues with time gap as well. He believes that he has been in [place] longer, no sense of when and where he is".  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 Recently has  worked for [employer]. Denies headaches, double vision, dizziness, focal numbness or tingling, unilateral weakness or tremors.  No constipation or diarrhea. Denies anosmia. Denies history of OSA, ETOH or Tobacco. Family History negative for dementia.   Labs  on 04/22/2021 CBC with hemoglobin 12.3/hematocrit 37.9 (in the setting of CKD) otherwise normal Creatinine 2.7 (CKD) Uric acid 9 (gout) B12 427 Folate RPR TSH 0.905 T4 1.71 RPR non reactive PSA 41.1 (has been referred to urology)  Lipid panel normal     Neuropsychological evaluation Dr. Milbert Coulter 10/11/21 evaluation on 10/03/2021. Please refer to that encounter for the full report and recommendations. Briefly, results suggested severe impairment across all aspects of learning and memory. Additional impairment was exhibited across semantic fluency and an isolated line orientation task, while performance variability was exhibited across executive functioning. Regarding etiology, I have notable concerns surrounding the presence of Alzheimer's disease and feel that this represents the most likely culprit for ongoing cognitive decline. Mr. Gruba did not benefit from repeated exposure to information across learning trials, was essentially amnestic across all memory tasks after a short delay, and generally performed below expectation across yes/no recognition tasks. All of which suggests the presence of rapid forgetting and an evolving and already fairly significant memory storage deficit, both of which are hallmark characteristics of Alzheimer's disease. Further impairment in semantic fluency is consistent with this disease process and its normal progression.  His wife's reporting of him rapidly forgetting information and repeating himself often also aligns well with Alzheimer's disease. PREVIOUS MEDICATIONS:   CURRENT MEDICATIONS:  Outpatient Encounter Medications as of 01/21/2023  Medication Sig   allopurinol (ZYLOPRIM) 100 MG tablet Take 1 tablet (100 mg total) by mouth daily.   baclofen (LIORESAL) 10 MG tablet TAKE 1 TABLET BY MOUTH EVERY DAY AS NEEDED   calcitRIOL (ROCALTROL) 0.25 MCG capsule TAKE 1 CAPSULE BY MOUTH EVERY DAY   colchicine 0.6 MG tablet TAKE 1 TABLET BY MOUTH EVERY DAY   donepezil (ARICEPT) 10 MG tablet Take 1 tablet at 10 mg daily   folic acid (FOLVITE) 1 MG tablet TAKE 1 TABLET BY MOUTH EVERY DAY   hydrALAZINE (APRESOLINE) 50 MG tablet Take 0.5 tablets (25 mg total) by mouth in the morning and at bedtime.   memantine (NAMENDA) 10 MG tablet Take 1 tab twice a day   pantoprazole (PROTONIX) 20 MG tablet Take 1 tablet (20 mg total) by mouth daily.   pravastatin (PRAVACHOL) 40 MG tablet Take 1 tablet (40 mg total) by mouth daily.   verapamil (CALAN-SR) 240 MG CR tablet Take 1 tablet (240 mg total) by mouth daily.   No facility-administered encounter medications on file as of 01/21/2023.       11/26/2022    4:00 PM 05/14/2022    7:00 AM  MMSE - Mini Mental State Exam  Orientation to time 4 0  Orientation to Place 3 2  Registration 3 3  Attention/ Calculation 4 4  Recall 2 0  Language- name 2 objects 2 2  Language- repeat 1 1  Language- follow 3 step command 3 2  Language- read & follow direction 1 1  Write a sentence 1 0  Copy design 1 1  Total score 25 16      06/01/2021    9:00 AM  Montreal Cognitive Assessment   Visuospatial/ Executive (0/5) 4  Naming (0/3) 3  Attention: Read list of digits (0/2) 2  Attention: Read list of letters (0/1) 0  Attention: Serial 7 subtraction starting at 100 (0/3) 1  Language: Repeat phrase (0/2) 2  Language : Fluency (0/1) 0  Abstraction (0/2) 0  Delayed Recall (0/5) 0   Orientation (0/6) 4  Total 16  Adjusted Score (based on education) 17  Objective:     PHYSICAL EXAMINATION:    VITALS:   Vitals:   01/21/23 0835  BP: (!) 120/54  Pulse: 74  SpO2: 95%  Weight: 204 lb (92.5 kg)  Height: 6' (1.829 m)    GEN:  The patient appears stated age and is in NAD. HEENT:  Normocephalic, atraumatic.   Neurological examination:  General: NAD, well-groomed, appears stated age.  He has poor insight into his cognitive status Orientation: The patient is alert. Oriented to person, no to place and date Cranial nerves: There is good facial symmetry.The speech is fluent and clear. No aphasia or dysarthria. Fund of knowledge is appropriate. Recent and remote memory are impaired. Attention and concentration are reduced.  Able to name objects and repeat phrases.  Hearing is intact to conversational tone.   Sensation: Sensation is intact to light touch throughout Motor: Strength is at least antigravity x4. DTR's 2/4 in UE/LE     Movement examination: Tone: There is normal tone in the UE/LE.  No cogwheeling is noted. Abnormal movements: Very mild resting and intention tremor.  No myoclonus.  No asterixis.   Coordination:  There is no decremation with RAM's. Normal finger to nose  Gait and Station: The patient has some difficulty arising out of a deep-seated chair without the use of the hands. The patient's stride length is shorter than prior exam.  Uses a right cane to ambulate.  Gait is cautious and narrow.    Thank you for allowing [place] the opportunity to participate in the care of this nice patient. Please do not hesitate to contact us for any questions or concerns.   Total time spent on today's visit was 22 minutes dedicated to this patient today, preparing to see patient, examining the patient, ordering tests and/or medications and counseling the patient, documenting clinical information in the EHR or other health record, independently interpreting results and  communicating results to the patient/family, discussing treatment and goals, answering patient's questions and coordinating care.  Cc:  Marjie Skiff, NP  Marlowe Kays 01/21/2023 10:15 AM

## 2023-01-21 NOTE — Patient Instructions (Signed)
It was a pleasure to see you today at our office.   Recommendations:  Follow up in  6 months Continue Donepezil 10 mg daily   Memantine 10mg tablets.  Take 1 tablet  twice daily.    Repeat neuropsychology testing     Whom to call:  Memory  decline, memory medications: Call our office [phone]   For psychiatric meds, mood meds: Please have your primary care physician manage these medications.   Counseling regarding caregiver distress, including caregiver depression, anxiety and issues regarding community resources, adult day care programs, adult living facilities, or memory care questions:   Feel free to contact Misty Taylor Palladino, Social Worker at [phone]   For assessment of decision of mental capacity and competency:  Call Dr. Michelle Haber, geriatric psychiatrist at [phone]  For guidance in geriatric dementia issues please call [facility] [phone]  For guidance regarding [facility] and if placement were needed at the facility, contact Nicole Reynolds, Social Worker tel: [phone]  If you have any severe symptoms of a stroke, or other severe issues such as confusion,severe chills or fever, etc call 911 or go to the ER as you may need to be evaluated further   Feel free to visit Facebook page " [email or web address]" for tips of how to care for people with memory problems.       RECOMMENDATIONS FOR ALL PATIENTS WITH MEMORY PROBLEMS: 1. Continue to exercise (Recommend 30 minutes of walking everyday, or 3 hours every week) 2. Increase social interactions - continue going to Church and enjoy social gatherings with friends and family 3. Eat healthy, avoid fried foods and eat more fruits and vegetables 4. Maintain adequate blood pressure, blood sugar, and blood cholesterol level. Reducing the risk of stroke and cardiovascular disease also helps promoting better memory. 5. Avoid stressful situations. Live a simple life and avoid aggravations.  Organize your time and prepare for the next day in anticipation. 6. Sleep well, avoid any interruptions of sleep and avoid any distractions in the bedroom that may interfere with adequate sleep quality 7. Avoid sugar, avoid sweets as there is a strong link between excessive sugar intake, diabetes, and cognitive impairment We discussed the Mediterranean diet, which has been shown to help patients reduce the risk of progressive memory disorders and reduces cardiovascular risk. This includes eating fish, eat fruits and green leafy vegetables, nuts like almonds and hazelnuts, walnuts, and also use olive oil. Avoid fast foods and fried foods as much as possible. Avoid sweets and sugar as sugar use has been linked to worsening of memory function.  There is always a concern of gradual progression of memory problems. If this is the case, then we may need to adjust level of care according to patient needs. Support, both to the patient and caregiver, should then be put into place.    The Alzheimer's Association is here all day, every day for people facing Alzheimer's disease through our free 24/7 Helpline: [phone]. The Helpline provides reliable information and support to all those who need assistance, such as individuals living with memory loss, Alzheimer's or other dementia, caregivers, health care professionals and the public.  Our highly trained and knowledgeable staff can help you with: Understanding memory loss, dementia and Alzheimer's  Medications and other treatment options  General information about aging and brain health  Skills to provide quality care and to find the best care from professionals  Legal, financial and living-arrangement decisions Our Helpline also features: Confidential care consultation provided   by master's level clinicians who can help with decision-making support, crisis assistance and education on issues families face every day  Help in a caller's preferred language using  our translation service that features more than 200 languages and dialects  Referrals to local community programs, services and ongoing support     FALL PRECAUTIONS: Be cautious when walking. Scan the area for obstacles that may increase the risk of trips and falls. When getting up in the mornings, sit up at the edge of the bed for a few minutes before getting out of bed. Consider elevating the bed at the head end to avoid drop of blood pressure when getting up. Walk always in a well-lit room (use night lights in the walls). Avoid area rugs or power cords from appliances in the middle of the walkways. Use a walker or a cane if necessary and consider physical therapy for balance exercise. Get your eyesight checked regularly.  FINANCIAL OVERSIGHT: Supervision, especially oversight when making financial decisions or transactions is also recommended.  HOME SAFETY: Consider the safety of the kitchen when operating appliances like stoves, microwave oven, and blender. Consider having supervision and share cooking responsibilities until no longer able to participate in those. Accidents with firearms and other hazards in the house should be identified and addressed as well.   ABILITY TO BE LEFT ALONE: If patient is unable to contact 911 operator, consider using LifeLine, or when the need is there, arrange for someone to stay with patients. Smoking is a fire hazard, consider supervision or cessation. Risk of wandering should be assessed by caregiver and if detected at any point, supervision and safe proof recommendations should be instituted.  MEDICATION SUPERVISION: Inability to self-administer medication needs to be constantly addressed. Implement a mechanism to ensure safe administration of the medications.   DRIVING: Regarding driving, in patients with progressive memory problems, driving will be impaired. We advise to have someone else do the driving if trouble finding directions or if minor accidents  are reported. Independent driving assessment is available to determine safety of driving.   If you are interested in the driving assessment, you can contact the following:  [employer] in [place] [phone]  [facility] [phone]  [facility] [phone]  [facility] [phone] or [phone]      Mediterranean Diet A Mediterranean diet refers to food and lifestyle choices that are based on the traditions of countries located on the [place] Sea. This way of eating has been shown to help prevent certain conditions and improve outcomes for people who have chronic diseases, like kidney disease and heart disease. What are tips for following this plan? Lifestyle  Cook and eat meals together with your family, when possible. Drink enough fluid to keep your urine clear or pale yellow. Be physically active every day. This includes: Aerobic exercise like running or swimming. Leisure activities like gardening, walking, or housework. Get 7-8 hours of sleep each night. If recommended by your health care provider, drink red wine in moderation. This means 1 glass a day for nonpregnant women and 2 glasses a day for men. A glass of wine equals 5 oz (150 mL). Reading food labels  Check the serving size of packaged foods. For foods such as rice and pasta, the serving size refers to the amount of cooked product, not dry. Check the total fat in packaged foods. Avoid foods that have saturated fat or trans fats. Check the ingredients list for added sugars, such as corn syrup. Shopping    At the grocery store, buy most of your food from the areas near the walls of the store. This includes: Fresh fruits and vegetables (produce). Grains, beans, nuts, and seeds. Some of these may be available in unpackaged forms or large amounts (in bulk). Fresh seafood. Poultry and eggs. Low-fat dairy products. Buy whole ingredients instead of prepackaged  foods. Buy fresh fruits and vegetables in-season from local farmers markets. Buy frozen fruits and vegetables in resealable bags. If you do not have access to quality fresh seafood, buy precooked frozen shrimp or canned fish, such as tuna, salmon, or sardines. Buy small amounts of raw or cooked vegetables, salads, or olives from the deli or salad bar at your store. Stock your pantry so you always have certain foods on hand, such as olive oil, canned tuna, canned tomatoes, rice, pasta, and beans. Cooking  Cook foods with extra-virgin olive oil instead of using butter or other vegetable oils. Have meat as a side dish, and have vegetables or grains as your main dish. This means having meat in small portions or adding small amounts of meat to foods like pasta or stew. Use beans or vegetables instead of meat in common dishes like chili or lasagna. Experiment with different cooking methods. Try roasting or broiling vegetables instead of steaming or sauteing them. Add frozen vegetables to soups, stews, pasta, or rice. Add nuts or seeds for added healthy fat at each meal. You can add these to yogurt, salads, or vegetable dishes. Marinate fish or vegetables using olive oil, lemon juice, garlic, and fresh herbs. Meal planning  Plan to eat 1 vegetarian meal one day each week. Try to work up to 2 vegetarian meals, if possible. Eat seafood 2 or more times a week. Have healthy snacks readily available, such as: Vegetable sticks with hummus. Greek yogurt. Fruit and nut trail mix. Eat balanced meals throughout the week. This includes: Fruit: 2-3 servings a day Vegetables: 4-5 servings a day Low-fat dairy: 2 servings a day Fish, poultry, or lean meat: 1 serving a day Beans and legumes: 2 or more servings a week Nuts and seeds: 1-2 servings a day Whole grains: 6-8 servings a day Extra-virgin olive oil: 3-4 servings a day Limit red meat and sweets to only a few servings a month What are my food  choices? Mediterranean diet Recommended Grains: Whole-grain pasta. Brown rice. Bulgar wheat. Polenta. Couscous. Whole-wheat bread. Oatmeal. Quinoa. Vegetables: Artichokes. Beets. Broccoli. Cabbage. Carrots. Eggplant. Green beans. Chard. Kale. Spinach. Onions. Leeks. Peas. Squash. Tomatoes. Peppers. Radishes. Fruits: Apples. Apricots. Avocado. Berries. Bananas. Cherries. Dates. Figs. Grapes. Lemons. Melon. Oranges. Peaches. Plums. Pomegranate. Meats and other protein foods: Beans. Almonds. Sunflower seeds. Pine nuts. Peanuts. Cod. Salmon. Scallops. Shrimp. Tuna. Tilapia. Clams. Oysters. Eggs. Dairy: Low-fat milk. Cheese. Greek yogurt. Beverages: Water. Red wine. Herbal tea. Fats and oils: Extra virgin olive oil. Avocado oil. Grape seed oil. Sweets and desserts: Greek yogurt with honey. Baked apples. Poached pears. Trail mix. Seasoning and other foods: Basil. Cilantro. Coriander. Cumin. Mint. Parsley. Sage. Rosemary. Tarragon. Garlic. Oregano. Thyme. Pepper. Balsalmic vinegar. Tahini. Hummus. Tomato sauce. Olives. Mushrooms. Limit these Grains: Prepackaged pasta or rice dishes. Prepackaged cereal with added sugar. Vegetables: Deep fried potatoes (french fries). Fruits: Fruit canned in syrup. Meats and other protein foods: Beef. Pork. Lamb. Poultry with skin. Hot dogs. Bacon. Dairy: Ice cream. Sour cream. Whole milk. Beverages: Juice. Sugar-sweetened soft drinks. Beer. Liquor and spirits. Fats and oils: Butter. Canola oil. Vegetable oil. Beef fat (tallow). Lard. Sweets and desserts: Cookies.   Cakes. Pies. Candy. Seasoning and other foods: Mayonnaise. Premade sauces and marinades. The items listed may not be a complete list. Talk with your dietitian about what dietary choices are right for you. Summary The Mediterranean diet includes both food and lifestyle choices. Eat a variety of fresh fruits and vegetables, beans, nuts, seeds, and whole grains. Limit the amount of red meat and sweets that  you eat. Talk with your health care provider about whether it is safe for you to drink red wine in moderation. This means 1 glass a day for nonpregnant women and 2 glasses a day for men. A glass of wine equals 5 oz (150 mL). This information is not intended to replace advice given to you by your health care provider. Make sure you discuss any questions you have with your health care provider. Document Released: 04/25/2016 Document Revised: 05/28/2016 Document Reviewed: 04/25/2016 Elsevier Interactive Patient Education  2017 [employer]     

## 2023-01-25 ENCOUNTER — Other Ambulatory Visit: Payer: Self-pay | Admitting: Physician Assistant

## 2023-02-13 ENCOUNTER — Other Ambulatory Visit: Payer: Self-pay | Admitting: Nurse Practitioner

## 2023-02-13 NOTE — Telephone Encounter (Signed)
Requested Prescriptions  Pending Prescriptions Disp Refills   folic acid (FOLVITE) 1 MG tablet [Pharmacy Med Name: FOLIC ACID 1 MG TABLET] 90 tablet 0    Sig: TAKE 1 TABLET BY MOUTH EVERY DAY     Endocrinology:  Vitamins Passed - 02/13/2023  2:43 AM      Passed - Valid encounter within last 12 months    Recent Outpatient Visits           5 months ago Prostate cancer (HCC)   [place] [facility] No Name, Corrie Dandy T, NP   12 months ago Dementia due to Alzheimer's disease (HCC)   [place] [facility] Mecum, Erin E, PA-C   1 year ago Dementia due to Alzheimer's disease (HCC)   [place] [facility] [name], [name], MD   1 year ago High prostate specific antigen (PSA)   [place] [facility] [name], [name], MD   1 year ago Need for influenza vaccination    [facility] [name], [name], MD       Future Appointments             In 6 days [place], Dorie Rank, NP  [facility], [facility]   In 3 months Richardo Hanks, Laurette Schimke, MD [facility] [place]

## 2023-02-17 ENCOUNTER — Other Ambulatory Visit: Payer: Self-pay | Admitting: Nurse Practitioner

## 2023-02-18 DIAGNOSIS — E538 Deficiency of other specified B group vitamins: Secondary | ICD-10-CM | POA: Insufficient documentation

## 2023-02-18 DIAGNOSIS — K219 Gastro-esophageal reflux disease without esophagitis: Secondary | ICD-10-CM | POA: Insufficient documentation

## 2023-02-18 HISTORY — DX: Deficiency of other specified B group vitamins: E53.8

## 2023-02-18 HISTORY — DX: Gastro-esophageal reflux disease without esophagitis: K21.9

## 2023-02-18 MED ORDER — COLCHICINE 0.6 MG PO TABS: 0.6000 mg | ORAL_TABLET | Freq: Every day | ORAL | 0 refills | Status: DC

## 2023-02-18 NOTE — Patient Instructions (Incomplete)
Start B12 1000 MCG by mouth daily.  Dementia Caregiver Guide Dementia is a term used to describe a number of symptoms that affect memory and thinking. The most common symptoms include: Memory loss. Trouble with language and communication. Trouble concentrating. Poor judgment and problems with reasoning. Wandering from home or public places. Extreme anxiety or depression. Being suspicious or having angry outbursts and accusations. Child-like behavior and language. Dementia can be frightening and confusing. And taking care of someone with dementia can be challenging. This guide provides tips to help you when providing care for a person with dementia. How to help manage lifestyle changes Dementia usually gets worse slowly over time. In the early stages, people with dementia can stay independent and safe with some help. In later stages, they need help with daily tasks such as dressing, grooming, and using the bathroom. There are actions you can take to help a person manage his or her life while living with this condition. Communicating When the person is talking or seems frustrated, make eye contact and hold the person's hand. Ask specific questions that need yes or no answers. Use simple words, short sentences, and a calm voice. Only give one direction at a time. When offering choices, limit the person to just one or two. Avoid correcting the person in a negative way. If the person is struggling to find the right words, gently try to help him or her. Preventing injury  Keep floors clear of clutter. Remove rugs, magazine racks, and floor lamps. Keep hallways well lit, especially at night. Put a handrail and nonslip mat in the bathtub or shower. Put childproof locks on cabinets that contain dangerous items, such as medicines, alcohol, guns, toxic cleaning items, sharp tools or utensils, matches, and lighters. For doors to the outside of the house, put the locks in places where the person  cannot see or reach them easily. This will help ensure that the person does not wander out of the house and get lost. Be prepared for emergencies. Keep a list of emergency phone numbers and addresses in a convenient area. Remove car keys and lock garage doors so that the person does not try to get in the car and drive. Have the person wear a bracelet that tracks locations and identifies the person as having memory problems. This should be worn at all times for safety. Helping with daily life  Keep the person on track with his or her routine. Try to identify areas where the person may need help. Be supportive, patient, calm, and encouraging. Gently remind the person that adjusting to changes takes time. Help with the tasks that the person has asked for help with. Keep the person involved in daily tasks and decisions as much as possible. Encourage conversation, but try not to get frustrated if the person struggles to find words or does not seem to appreciate your help. How to recognize stress Look for signs of stress in yourself and in the person you are caring for. If you notice signs of stress, take steps to manage it. Symptoms of stress include: Feeling anxious, irritable, frustrated, or angry. Denying that the person has dementia or that his or her symptoms will not improve. Feeling depressed, hopeless, or unappreciated. Difficulty sleeping. Difficulty concentrating. Developing stress-related health problems. Feeling like you have too little time for your own life. Follow these instructions at home: Take care of your health Make sure that you and the person you are caring for: Get regular sleep. Exercise regularly. Eat regular,  nutritious meals. Take over-the-counter and prescription medicines only as told by your health care providers. Drink enough fluid to keep your urine pale yellow. Attend all scheduled health care appointments.  General instructions Join a support group with  others who are caregivers. Ask about respite care resources. Respite care can provide short-term care for the person so that you can have a regular break from the stress of caregiving. Consider any safety risks and take steps to avoid them. Organize medicines in a pill box for each day of the week. Create a plan to handle any legal or financial matters. Get legal or financial advice if needed. Keep a calendar in a central location to remind the person of appointments or other activities. Where to find support: Many individuals and organizations offer support. These include: Support groups for people with dementia. Support groups for caregivers. Counselors or therapists. Home health care services. Adult day care centers. Where to find more information Centers for Disease Control and Prevention: FootballExhibition.com.br Alzheimer's Association: LimitLaws.hu Family Caregiver Alliance: www.caregiver.org Alzheimer's Foundation of [place]: www.alzfdn.org Contact a health care provider if: The person's health is rapidly getting worse. You are no longer able to care for the person. Caring for the person is affecting your physical and emotional health. You are feeling depressed or anxious about caring for the person. Get help right away if: The person threatens himself or herself, you, or anyone else. You feel depressed or sad, or feel that you want to harm yourself. If you ever feel like your loved one may hurt himself or herself or others, or if he or she shares thoughts about taking his or her own life, get help right away. You can go to your nearest emergency department or: Call your local emergency services (911 in the [place] Call a suicide crisis helpline, such as the National Suicide Prevention Lifeline at 442-737-1921 or 988 in the [place] This is open 24 hours a day in the [place] Text the Crisis Text Line at (646)295-2094 (in the [place] Summary Dementia is a term used to describe a number of symptoms that  affect memory and thinking. Dementia usually gets worse slowly over time. Take steps to reduce the person's risk of injury and to plan for future care. Caregivers need support, relief from caregiving, and time for their own lives. This information is not intended to replace advice given to you by your health care provider. Make sure you discuss any questions you have with your health care provider. Document Revised: 03/28/2021 Document Reviewed: 01/17/2020 Elsevier Patient Education  2024 [employer].

## 2023-02-18 NOTE — Telephone Encounter (Signed)
Unable to refill per protocol, Rx request is too soon. Last refill 02/13/23 and 02/18/23 for 90 days.  Requested Prescriptions  Pending Prescriptions Disp Refills   folic acid (FOLVITE) 1 MG tablet [Pharmacy Med Name: FOLIC ACID 1 MG TABLET] 90 tablet 0    Sig: TAKE 1 TABLET BY MOUTH EVERY DAY     Endocrinology:  Vitamins Passed - 02/17/2023 10:22 AM      Passed - Valid encounter within last 12 months    Recent Outpatient Visits           6 months ago Prostate cancer (HCC)   [place] [facility] [place], Corrie Dandy T, NP   1 year ago Dementia due to Alzheimer's disease (HCC)   [place] [facility] Mecum, Erin E, PA-C   1 year ago Dementia due to Alzheimer's disease (HCC)   [place] [facility] [name], [name], MD   1 year ago High prostate specific antigen (PSA)   [place] [facility] [name], [name], MD   1 year ago Need for influenza vaccination   [place] [facility] [name], [name], MD       Future Appointments             Tomorrow [place], Dorie Rank, NP [place] [facility]   In 2 months Richardo Hanks, Laurette Schimke, MD [facility] [place]             colchicine 0.6 MG tablet [Pharmacy Med Name: COLCHICINE 0.6 MG TABLET] 90 tablet 0    Sig: TAKE 1 TABLET BY MOUTH EVERY DAY     Endocrinology:  Gout Agents - colchicine Failed - 02/17/2023 10:22 AM      Failed - Cr in normal range and within 360 days    Creatinine, Ser  Date Value Ref Range Status  08/06/2021 2.06 (H) 0.76 - 1.27 mg/dL Final         Failed - ALT in normal range and within 360 days    ALT  Date Value Ref Range Status  08/06/2021 29 0 - 44 IU/L Final         Failed - AST in normal range and within 360 days    AST  Date Value Ref Range Status  08/06/2021 40 0 - 40 IU/L Final         Failed - CBC within normal limits and completed in the last 12 months    WBC  Date Value Ref Range Status  02/06/2022 5.3 4.0 -  10.5 K/uL Final   RBC  Date Value Ref Range Status  02/06/2022 3.94 (L) 4.22 - 5.81 MIL/uL Final   Hemoglobin  Date Value Ref Range Status  02/06/2022 11.9 (L) 13.0 - 17.0 g/dL Final  16/06/9603 54.0 13.0 - 17.7 g/dL Final   HCT  Date Value Ref Range Status  02/06/2022 37.2 (L) 39.0 - 52.0 % Final   Hematocrit  Date Value Ref Range Status  08/06/2021 40.9 37.5 - 51.0 % Final   MCHC  Date Value Ref Range Status  02/06/2022 32.0 30.0 - 36.0 g/dL Final   [facility]  Date Value Ref Range Status  02/06/2022 30.2 26.0 - 34.0 pg Final   MCV  Date Value Ref Range Status  02/06/2022 94.4 80.0 - 100.0 fL Final  08/06/2021 93 79 - 97 fL Final   No results found for: "PLTCOUNTKUC", "LABPLAT", "POCPLA" RDW  Date Value Ref Range Status  02/06/2022 14.4 11.5 - 15.5 %  Final  08/06/2021 12.2 11.6 - 15.4 % Final         Passed - Valid encounter within last 12 months    Recent Outpatient Visits           6 months ago Prostate cancer (HCC)   [place] [facility] [place], Corrie Dandy T, NP   1 year ago Dementia due to Alzheimer's disease (HCC)   [place] [facility] Mecum, Oswaldo Conroy, PA-C   1 year ago Dementia due to Alzheimer's disease (HCC)   [place] [facility] [name], [name], MD   1 year ago High prostate specific antigen (PSA)   [place] [facility] [name], [name], MD   1 year ago Need for influenza vaccination   [place] [facility] [name], [name], MD       Future Appointments             Tomorrow Marjie Skiff, NP [place] [facility]   In 2 months Richardo Hanks, Laurette Schimke, MD [facility] [place]

## 2023-02-19 ENCOUNTER — Encounter: Payer: Self-pay | Admitting: Nurse Practitioner

## 2023-02-19 ENCOUNTER — Ambulatory Visit: Payer: Federal, State, Local not specified - PPO | Admitting: Nurse Practitioner

## 2023-02-19 VITALS — BP 130/68 | HR 92 | Temp 98.0°F | Ht 72.01 in | Wt 203.2 lb

## 2023-02-19 DIAGNOSIS — C61 Malignant neoplasm of prostate: Secondary | ICD-10-CM

## 2023-02-19 DIAGNOSIS — G309 Alzheimer's disease, unspecified: Secondary | ICD-10-CM | POA: Diagnosis not present

## 2023-02-19 DIAGNOSIS — N184 Chronic kidney disease, stage 4 (severe): Secondary | ICD-10-CM

## 2023-02-19 DIAGNOSIS — K219 Gastro-esophageal reflux disease without esophagitis: Secondary | ICD-10-CM

## 2023-02-19 DIAGNOSIS — I10 Essential (primary) hypertension: Secondary | ICD-10-CM

## 2023-02-19 DIAGNOSIS — F0280 Dementia in other diseases classified elsewhere without behavioral disturbance: Secondary | ICD-10-CM

## 2023-02-19 DIAGNOSIS — E782 Mixed hyperlipidemia: Secondary | ICD-10-CM

## 2023-02-19 DIAGNOSIS — E538 Deficiency of other specified B group vitamins: Secondary | ICD-10-CM

## 2023-02-19 DIAGNOSIS — M1A0710 Idiopathic chronic gout, right ankle and foot, without tophus (tophi): Secondary | ICD-10-CM

## 2023-02-19 DIAGNOSIS — E210 Primary hyperparathyroidism: Secondary | ICD-10-CM | POA: Diagnosis not present

## 2023-02-19 DIAGNOSIS — D508 Other iron deficiency anemias: Secondary | ICD-10-CM

## 2023-02-19 NOTE — Assessment & Plan Note (Signed)
Chronic, ongoing.  Continue current medication regimen and adjust as needed. Lipid panel today. 

## 2023-02-19 NOTE — Assessment & Plan Note (Signed)
Chronic, ongoing.  Followed by nephrology, recent notes and labs reviewed.   

## 2023-02-19 NOTE — Assessment & Plan Note (Addendum)
Chronic, progressive.  On memory care medications per neurology.  Continue these and collaboration with neurology, recent note reviewed.  Ordered Rollator to assist with mobility and rest time when out and about.

## 2023-02-19 NOTE — Assessment & Plan Note (Signed)
Chronic, ongoing.  Followed by urology and continues treatment -- recent notes reviewed. 

## 2023-02-19 NOTE — Assessment & Plan Note (Signed)
Chronic, ongoing.  Followed by nephrology, continue this collaboration.  Recent notes and labs reviewed. 

## 2023-02-19 NOTE — Progress Notes (Addendum)
BP 130/68 (BP Location: Left Arm, Patient Position: Sitting, Cuff Size: Normal)   Pulse 92   Temp 98 F (36.7 C) (Oral)   Ht 6' 0.01" (1.829 m)   Wt 203 lb 3.2 oz (92.2 kg)   SpO2 98%   BMI 27.55 kg/m    Subjective:    Patient ID: Jason Estrada, male    DOB: 1941/04/02, 82 y.o.   MRN: [id number]  HPI: Jason Estrada is a 82 y.o. male  Chief Complaint  Patient presents with   Dementia   Hyperlipidemia   Hypertension   Chronic Kidney Disease   Gout   Prostate Cancer   HYPERTENSION / HYPERLIPIDEMIA + DEMENTIA Current medications: Pravastatin, Hydralazine, Verapamil.   Is followed by neurology with last visit on 01/21/23.  Continues Memantine and Aricept. No recent falls or fractures, is to be using cane consistently.  Continues to have right knee pain which his wife reports is worsening and is barely walking, has done PT at home but does not consistently stick to routine.  Did go to ortho on 08/23/22, received cortisone shots which help briefly.  At home is able to do many things on own: dressing and toileting.  No longer driving.  Continues to eat meals well at home and remain continent.  Uses four prong cane -- wife would like a rollator for him to assist with walking and when going out to appointments for fall prevention.   Satisfied with current treatment? yes Duration of hypertension: chronic BP monitoring frequency: a few times a week BP range: 130/80 range BP medication side effects: no Duration of hyperlipidemia: chronic Cholesterol medication side effects: no Cholesterol supplements: none Medication compliance: good compliance Aspirin: no Recent stressors: no Recurrent headaches: no Visual changes: no Palpitations: no Dyspnea: no Chest pain: no Lower extremity edema: no Dizzy/lightheaded: no   CHRONIC KIDNEY DISEASE Followed by nephrology with last visit on 11/07/22 -- labs CRT 2.08, eGFR 32, BUN 26, HGB 12.4.  Is also seeing urology for prostate cancer with last  visit 11/14/22, radiation completed 02/15/22.  Continues treatment shots == last one scheduled for July.  Continues on Allopurinol for history of gout, no flare in over a year.  Protonix remains on board for GERD with stable control. CKD status: stable Medications renally dose: yes Previous renal evaluation: yes Pneumovax:  Up to Date Influenza Vaccine:  Up to Date   Relevant past medical, surgical, family and social history reviewed and updated as indicated. Interim medical history since our last visit reviewed. Allergies and medications reviewed and updated.  Review of Systems  Constitutional:  Negative for activity change, diaphoresis, fatigue and fever.  Respiratory:  Negative for cough, chest tightness, shortness of breath and wheezing.   Cardiovascular:  Negative for chest pain, palpitations and leg swelling.  Gastrointestinal: Negative.   Musculoskeletal:  Positive for arthralgias.  Psychiatric/Behavioral: Negative.      Per HPI unless specifically indicated above     Objective:    BP 130/68 (BP Location: Left Arm, Patient Position: Sitting, Cuff Size: Normal)   Pulse 92   Temp 98 F (36.7 C) (Oral)   Ht 6' 0.01" (1.829 m)   Wt 203 lb 3.2 oz (92.2 kg)   SpO2 98%   BMI 27.55 kg/m   Wt Readings from Last 3 Encounters:  02/19/23 203 lb 3.2 oz (92.2 kg)  01/21/23 204 lb (92.5 kg)  11/26/22 199 lb (90.3 kg)    Physical Exam Vitals and nursing note reviewed.  Constitutional:  General: He is awake. He is not in acute distress.    Appearance: He is well-developed and well-groomed. He is not ill-appearing or toxic-appearing.  HENT:     Head: Normocephalic and atraumatic.     Right Ear: Hearing normal. No drainage.     Left Ear: Hearing normal. No drainage.  Eyes:     General: Lids are normal.        Right eye: No discharge.        Left eye: No discharge.     Conjunctiva/sclera: Conjunctivae normal.     Pupils: Pupils are equal, round, and reactive to light.   Neck:     Thyroid: No thyromegaly.     Vascular: No carotid bruit or JVD.     Trachea: Trachea normal.  Cardiovascular:     Rate and Rhythm: Normal rate and regular rhythm.     Heart sounds: Normal heart sounds, S1 normal and S2 normal. No murmur heard.    No gallop.  Pulmonary:     Effort: Pulmonary effort is normal. No accessory muscle usage or respiratory distress.     Breath sounds: Normal breath sounds.  Abdominal:     General: Bowel sounds are normal.     Palpations: Abdomen is soft. There is no hepatomegaly or splenomegaly.  Musculoskeletal:     Cervical back: Normal range of motion and neck supple.     Right lower leg: No edema.     Left lower leg: No edema.     Comments: Antalgic gait present -- not using cane or walker.  Skin:    General: Skin is warm and dry.     Capillary Refill: Capillary refill takes less than 2 seconds.     Findings: No rash.  Neurological:     Mental Status: He is alert and oriented to person, place, and time.     Comments: Takes time on recall, but overall oriented today.  Psychiatric:        Attention and Perception: Attention normal.        Mood and Affect: Mood normal.        Speech: Speech normal.        Behavior: Behavior normal. Behavior is cooperative.        Thought Content: Thought content normal.    Results for orders placed or performed in visit on 11/11/22  PSA  Result Value Ref Range   Prostate Specific Ag, Serum <0.1 0.0 - 4.0 ng/mL      Assessment & Plan:   Problem List Items Addressed This Visit       Cardiovascular and Mediastinum   Essential hypertension    Chronic, ongoing with BP stable on recheck today and at goal on home readings.  Recommend he monitor BP at least a few mornings a week at home and document.  DASH diet at home.  Continue current medication regimen and adjust as needed.  Labs today: up to date with nephrology with exception of TSH, check today.  Return in 6 months.       Relevant Orders   TSH      Digestive   GERD without esophagitis    Chronic, ongoing.  Continues on Protonix daily.  Check Mag level today.  Risks of PPI use were discussed with patient including bone loss, C. Diff diarrhea, pneumonia, infections, CKD, electrolyte abnormalities. Verbalizes understanding and chooses to continue the medication.       Relevant Orders   Magnesium     Endocrine  Primary hyperparathyroidism (HCC)    Chronic, ongoing.  Followed by nephrology, continue this collaboration.  Recent notes and labs reviewed.        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  On memory care medications per neurology.  Continue these and collaboration with neurology, recent note reviewed.  Ordered Rollator to assist with mobility and rest time when out and about.      Relevant Orders   For home use only DME 4 wheeled rolling walker with seat ([id number])     Genitourinary   Chronic kidney disease, stage 4 (severe)    Chronic, ongoing.  Followed by nephrology, recent notes and labs reviewed.        Prostate cancer (HCC)    Chronic, ongoing.  Followed by urology and continues treatment -- recent notes reviewed.        Other   B12 deficiency    Noted on past labs, has not started supplement.  Recommend they start B12 over the counter 1000 MCG daily for overall nervous system health.      Relevant Orders   Vitamin B12   Gout    Chronic, stable with no recent flares.  Continue Allopurinol as ordered and further renal dose if needed.  Uric acid level today.      Relevant Orders   Uric acid   Hyperlipidemia    Chronic, ongoing.  Continue current medication regimen and adjust as needed.  Lipid panel today.         Relevant Orders   Lipid Panel w/o Chol/HDL Ratio     Follow up plan: Return in about 6 months (around 08/21/2023) for HTN/HLD, DEMENTIA, PROSTATE CA, GERD.

## 2023-02-19 NOTE — Assessment & Plan Note (Signed)
Chronic, stable with no recent flares.  Continue Allopurinol as ordered and further renal dose if needed.  Uric acid level today. 

## 2023-02-19 NOTE — Assessment & Plan Note (Signed)
Chronic, ongoing with BP stable on recheck today and at goal on home readings.  Recommend he monitor BP at least a few mornings a week at home and document.  DASH diet at home.  Continue current medication regimen and adjust as needed.  Labs today: up to date with nephrology with exception of TSH, check today.  Return in 6 months.

## 2023-02-19 NOTE — Assessment & Plan Note (Addendum)
Chronic, ongoing.  Continues on Protonix daily.  Check Mag level today.  Risks of PPI use were discussed with patient including bone loss, C. Diff diarrhea, pneumonia, infections, CKD, electrolyte abnormalities. Verbalizes understanding and chooses to continue the medication.

## 2023-02-19 NOTE — Assessment & Plan Note (Signed)
Noted on past labs, has not started supplement.  Recommend they start B12 over the counter 1000 MCG daily for overall nervous system health.

## 2023-02-20 LAB — VITAMIN B12: Vitamin B-12: 225 pg/mL — ABNORMAL LOW (ref 232–1245)

## 2023-02-20 LAB — MAGNESIUM: Magnesium: 2 mg/dL (ref 1.6–2.3)

## 2023-02-20 LAB — LIPID PANEL W/O CHOL/HDL RATIO
Cholesterol, Total: 149 mg/dL (ref 100–199)
HDL: 69 mg/dL (ref >39)
LDL Chol Calc (NIH): 63 mg/dL (ref 0–99)
Triglycerides: 89 mg/dL (ref 0–149)
VLDL Cholesterol Cal: 17 mg/dL (ref 5–40)

## 2023-02-20 LAB — TSH: TSH: 0.835 u[IU]/mL (ref 0.450–4.500)

## 2023-02-20 LAB — URIC ACID: Uric Acid: 6.3 mg/dL (ref 3.8–8.4)

## 2023-02-20 NOTE — Progress Notes (Signed)
Contacted via MyChart, but they do not consistently check this so please call:  Good day Jason Estrada, your labs have returned and overall remain stable with exception of B12 level remaining low as we discussed, this can affect memory.  Please start Vitamin B12 1000 MCG daily which you can get over the counter. I wrote this on wrap up sheet yesterday as well.  Any questions? Keep being amazing!!  Thank you for allowing me to participate in your care.  I appreciate you. Kindest regards, [place]

## 2023-02-21 NOTE — Progress Notes (Signed)
Patient's wife notified that DME order was sent to [employer] in [place]. Wife stated that she would reach out to them about walker

## 2023-03-14 ENCOUNTER — Telehealth: Payer: Self-pay | Admitting: Nurse Practitioner

## 2023-03-14 ENCOUNTER — Encounter: Payer: Self-pay | Admitting: Psychology

## 2023-03-14 NOTE — Telephone Encounter (Signed)
Pt's wife is calling in and says pt was supposed to get a walker with a seat that per Darl Pikes was ordered by [employer]. Darl Pikes says when the walker arrived it was not the one with the seat, it was just a regular walker. Darl Pikes is requesting the correct walker be ordered. Darl Pikes is also requesting a call back from the office regarding this matter. Please follow up with Darl Pikes.

## 2023-03-17 ENCOUNTER — Ambulatory Visit: Payer: Federal, State, Local not specified - PPO | Admitting: Psychology

## 2023-03-17 ENCOUNTER — Encounter: Payer: Self-pay | Admitting: Psychology

## 2023-03-17 DIAGNOSIS — G309 Alzheimer's disease, unspecified: Secondary | ICD-10-CM

## 2023-03-17 DIAGNOSIS — F0280 Dementia in other diseases classified elsewhere without behavioral disturbance: Secondary | ICD-10-CM | POA: Diagnosis not present

## 2023-03-17 DIAGNOSIS — R4189 Other symptoms and signs involving cognitive functions and awareness: Secondary | ICD-10-CM

## 2023-03-17 NOTE — Progress Notes (Signed)
   Psychometrician Note   Cognitive testing was administered to Jason Estrada by Wallace Keller, B.S. (psychometrist) under the supervision of Dr. Newman Nickels, Ph.D., licensed psychologist on 03/17/2023. Mr. Espericueta did not appear overtly distressed by the testing session per behavioral observation or responses across self-report questionnaires. Rest breaks were offered.    The battery of tests administered was selected by Dr. Newman Nickels, Ph.D. with consideration to Mr. Manoukian current level of functioning, the nature of his symptoms, emotional and behavioral responses during interview, level of literacy, observed level of motivation/effort, and the nature of the referral question. This battery was communicated to the psychometrist. Communication between Dr. Newman Nickels, Ph.D. and the psychometrist was ongoing throughout the evaluation and Dr. Newman Nickels, Ph.D. was immediately accessible at all times. Dr. Newman Nickels, Ph.D. provided supervision to the psychometrist on the date of this service to the extent necessary to assure the quality of all services provided.    Jason Estrada will return within approximately 1-2 weeks for an interactive feedback session with Dr. Milbert Coulter at which time his test performances, clinical impressions, and treatment recommendations will be reviewed in detail. Mr. Robberson understands he can contact our office should he require our assistance before this time.  A total of 130 minutes of billable time were spent face-to-face with Mr. Daykin by the psychometrist. This includes both test administration and scoring time. Billing for these services is reflected in the clinical report generated by Dr. Newman Nickels, Ph.D.  This note reflects time spent with the psychometrician and does not include test scores or any clinical interpretations made by Dr. Milbert Coulter. The full report will follow in a separate note.

## 2023-03-17 NOTE — Progress Notes (Unsigned)
NEUROPSYCHOLOGICAL EVALUATION [place]. [facility] Department of Neurology  Date of Evaluation: March 17, 2023  Reason for Referral:   Shaylon Mangal is a 82 y.o. 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   Assessment and Plan:   Clinical Impression(s): Mr. Arvelo pattern of performance is suggestive of severe impairment surrounding all aspects of learning and memory. Additional impairments were exhibited across processing speed, several aspects of executive functioning (i.e., cognitive flexibility, response inhibition, and safety/judgment), and semantic fluency. Performance variability was exhibited across confrontation naming. Performances were appropriate relative to age-matched peers across attention/concentration, abstract reasoning, receptive language, phonemic fluency, and visuospatial abilities. Functionally, Mr. Krajicek wife has fully taken over all instrumental ADLs and has started to provide assistance with the initiation of more basic ADLs. He also no longer drives due to cognitive decline. Given cognitive and functional impairment, Mr. 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  Relative to his previous evaluation in January 2023, cognitive decline was exhibited across several domains including processing speed, executive functioning, semantic fluency, confrontation naming, and recognition/consolidation aspects of memory. Broadly speaking, the rate of decline appeared relatively mild. Stability was exhibited across other assessed domains.   Regarding etiology, I continue to have primary concerns surrounding underlying Alzheimer's disease  (i.e., dementia due to Alzheimer's disease). Across memory testing, Mr. Lockner did not benefit from repeated exposure to information across learning trials, was essentially amnestic across memory tasks after a short delay, and performed poorly across yes/no recognition tasks. This suggests the presence of rapid forgetting and a significant memory storage deficit, both of which are hallmark characteristics of Alzheimer's disease. Evidence for progressive cognitive decline over the past 18 months also elevates concerns for the presence of a neurodegenerative illness, as does this decline largely occurring in domains commonly impacted by this illness (e.g., memory, semantic fluency, confrontation naming, executive functioning). His wife's reporting of him rapidly forgetting information and repeating himself continues to align with this illness as well. Overall, this would appear the most likely culprit for ongoing cognitive impairment and progressive decline.   Recommendations: Mr. Bayles has already been prescribed two medications aimed to address memory loss and concerns surrounding Alzheimer's disease (i.e., donepezil/Aricept and memantine/Namenda). He is encouraged to continue taking this medication as prescribed. It is important to highlight that these medications have been shown to slow functional decline in some individuals. There is no current treatment which can stop or reverse cognitive decline when caused by a neurodegenerative illness.   I agree with Mr. Sloane wife's decision to have him fully abstain from all driving pursuits. This is based not only on the current degree of cognitive impairment, but also her prior report of him getting lost while driving for extended periods of time on several occasions.   It will be important for Mr. Hersi to have another person with him when in situations where he may need to process information, weigh the pros and cons of different options, and make  decisions, in order to ensure that he fully understands and recalls all information to be considered.  If not already done, Mr. Piechowski and his family may want to discuss his wishes regarding durable power of attorney and medical decision making, so that he can have  input into these choices. If they require legal assistance with this, long-term care resource access, or other aspects of estate planning, they could reach out to [employer] at [phone] for a free consultation. Additionally, they may wish to discuss future plans for caretaking and seek out community options for in home/residential care should they become necessary.  Mr. Hiracheta is encouraged to attend to lifestyle factors for brain health (e.g., regular physical exercise, good nutrition habits and consideration of the MIND-DASH diet, regular participation in cognitively-stimulating activities, and general stress management techniques), which are likely to have benefits for both emotional adjustment and cognition. Optimal control of vascular risk factors (including safe cardiovascular exercise and adherence to dietary recommendations) is encouraged. Continued participation in activities which provide mental stimulation and social interaction is also recommended.   Important information should be provided to Mr. Mitzner in written format in all instances. This information should be placed in a highly frequented and easily visible location within his home to promote recall. External strategies such as written notes in a consistently used memory journal, visual and nonverbal auditory cues such as a calendar on the refrigerator or appointments with alarm, such as on a cell phone, can also help maximize recall.  To address problems with processing speed, he may wish to consider:   -Ensuring that he is alerted when essential material or instructions are being presented   -Adjusting the speed at which new information is presented   -Allowing  for more time in comprehending, processing, and responding in conversation   -Repeating and paraphrasing instructions or conversations aloud  To address problems with fluctuating attention and/or executive dysfunction, he may wish to consider:   -Avoiding external distractions when needing to concentrate   -Limiting exposure to fast paced environments with multiple sensory demands   -Writing down complicated information and using checklists   -Attempting and completing one task at a time (i.e., no multi-tasking)   -Verbalizing aloud each step of a task to maintain focus   -Taking frequent breaks during the completion of steps/tasks to avoid fatigue   -Reducing the amount of information considered at one time   -Scheduling more difficult activities for a time of day where he is usually most alert  Review of Records:   Mr. Piana was seen by [facility] Marlowe Kays, PA-C) on 08/15/2021 for an evaluation of memory loss. When asked about memory, he described these abilities as "pretty good." Per his wife, he will repeat the same sentences and questions and cannot keep time frames. She noted some behavioral changes in that he has developed somewhat of an obsession with the trash in that he will look for things inside the trash can and sometimes take garbage back into the house. She noted that he will insist on placing dirty food canisters and glasses back into the cabinet, including previously used paper plates. His wife noted hygiene concerns in that he seems reluctant to take a shower and has gone two weeks without bathing before. His wife manages more instrumental ADLs. While he continues to drive, navigational difficulties have increased, even while using a GPS. No mood concerns were noted. Sleep was descried as good. They denied hallucinations, REM behaviors, or paranoia. There are times where he may confuse nighttime and daytime and want to eat breakfast at midnight. He has some urinary  hesitancy due to prostate issues. He denied ongoing headaches, double vision, dizziness, focal numbness or tingling, unilateral weakness or tremors, constipation or diarrhea, or anosmia. Performance on  a brief cognitive screening instrument (MOCA) on 06/04/2021 was 17/30. Ultimately, Mr. Kesten was referred for a comprehensive neuropsychological evaluation to characterize his cognitive abilities and to assist with diagnostic clarity and treatment planning.   He completed a comprehensive neuropsychological evaluation with myself on 10/03/2021. Results suggested severe impairment across all aspects of learning and memory. Additional impairment was exhibited across semantic fluency and an isolated line orientation task, while performance variability was exhibited across executive functioning. Performances were generally adequate relative to age-matched peers across domains of processing speed, attention/concentration, safety/judgment, receptive language, confrontation naming, and visuoconstructional abilities. Given ADL dysfunction and cognitive impairment, he was diagnosed with a major neurocognitive disorder ("dementia") presentation. Concerns for underlying Alzheimer's disease were expressed. Repeat testing was recommended to assess trajectory.   He was most recently seen by Ms. Wertman on 11/26/2022 for follow-up. Cognition was largely described as being stable. ADL dysfunction had continued and somewhat worsened, evidenced by Mr. Petska no longer driving due to cognitive concerns. Ultimately, Mr. Hecht was referred for a repeat neuropsychological evaluation to characterize his cognitive abilities and to assist with diagnostic clarity and treatment planning.    Brain MRI on 06/20/2021 revealed moderate small vessel ischemic changes, as well as mild to moderate generalized cerebral atrophy. No other neuroimaging was available for review.  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 This was completed in June 2023, and initial ADT injection was given 12/10/2021.    Sinus tachycardia 09/26/2021   Vitamin B12 deficiency 02/18/2023   229 December 2023    Past Surgical History:  Procedure Laterality Date   CHOLECYSTECTOMY      Current Outpatient Medications:    allopurinol (ZYLOPRIM) 100 MG tablet, Take 1 tablet (100 mg total) by mouth daily., Disp: 90 tablet, Rfl: 4   baclofen (LIORESAL) 10 MG tablet, TAKE 1 TABLET BY MOUTH EVERY DAY AS NEEDED, Disp: 90 tablet, Rfl: 2   calcitRIOL (ROCALTROL) 0.25 MCG capsule, TAKE 1 CAPSULE BY MOUTH EVERY DAY, Disp: 90 capsule, Rfl: 4   colchicine 0.6 MG tablet, Take 1 tablet (0.6 mg total) by mouth daily., Disp: 90 tablet, Rfl: 0   donepezil (ARICEPT) 10 MG tablet, TAKE 1 TABLET AT 10 MG DAILY, Disp: 90 tablet, Rfl: 3   folic acid (FOLVITE) 1 MG tablet, TAKE 1 TABLET BY MOUTH EVERY DAY, Disp: 90 tablet, Rfl: 0   hydrALAZINE (APRESOLINE) 50 MG tablet, Take 0.5 tablets (25 mg total) by mouth in the morning and at bedtime., Disp: 180 tablet, Rfl: 4   memantine (NAMENDA) 10 MG tablet, Take 1 tab twice a day, Disp: 180 tablet, Rfl: 3   pantoprazole (PROTONIX) 20 MG tablet, Take 1 tablet (20 mg total) by mouth daily., Disp: 90 tablet, Rfl: 4   pravastatin (PRAVACHOL) 40 MG tablet, Take 1 tablet (40 mg total)  by mouth daily., Disp:  90 tablet, Rfl: 4   verapamil (CALAN-SR) 240 MG CR tablet, Take 1 tablet (240 mg total) by mouth daily., Disp: 90 tablet, Rfl: 4  Clinical Interview:   The following information was obtained during a clinical interview with Mr. Mcnichol and his wife prior to cognitive testing.  Cognitive Symptoms: Decreased short-term memory: Denied. However, his wife reported far more pronounced concerns. Examples were consistent with what is reported above and within his medical records, summarized by his neurologist. This will not be rehashed here. Relative to his previous evaluation in January 2023, she reported ongoing progressive neurological decline, including worsening short-term memory impairments. All in all, cognitive decline was said to be present for the past several years.  Decreased long-term memory: Denied. Decreased attention/concentration: Denied. Reduced processing speed: Denied. Difficulties with executive functions: Denied. He also denied trouble with impulsivity or any significant personality changes. His wife previously stated that he had several emotional outbursts, seems to have a much shorter fuse, and is easily irritated. Records do suggest an instance around Thanksgiving 2022 where he became quite angry towards his grandchild when he/she asked for the television remote to change the channel. Currently, his wife did not describe personality changes as being exacerbated relative to his most recent baseline.  Difficulties with emotion regulation: Denied. Difficulties with receptive language: Denied. Difficulties with word finding: Denied. Relative to his previous evaluation, his wife described far greater word finding difficulties, as well as instances where he will incorrectly substitute nouns while speaking, leading to confusing conversations at times.  Decreased visuoperceptual ability: Denied.   Difficulties completing ADLs: His wife was fully managing finances and  bill paying at the time of his previous evaluation. Since that time, she has had to fully take over medication management as well. She monitors Mr. Kowalsky to make sure that pills are taken adequately, noting that he will forget. Over the past 18 months, his wife also described two instances where Mr. Mccullick got lost for multiple hours while driving. She detailed one instance where he incorrectly entered an address in his GPS and was driving to an unknown location towards the [place] part of the country. She noted that he ultimately ran out of gas around [place], [place] and was able to be retrieved. As such, she has taken his keys and he no longer drives. His wife also described observations via her Ring doorbell camera where Mr. Deiters will walk out the door, walk to the sidewalk, look around, and come back inside. This is then repeated numerous times for an unknown reason. She denied other wandering behaviors. Medical records also suggest Mr. Cubero wife being more involved in basic ADLs, including coaxing him to take a shower and put on clean clothes.   Additional Medical History: History of traumatic brain injury/concussion: Denied. History of stroke: Denied. History of seizure activity: Denied. History of known exposure to toxins: Denied. Symptoms of chronic pain: Denied. Experience of frequent headaches/migraines: Denied. Frequent instances of dizziness/vertigo: Denied.   Sensory changes: He wears glasses with benefit. Other sensory changes/difficulties (e.g., hearing, taste, or smell) were denied.  Balance/coordination difficulties: Denied. However, balance can be impacted by chronic right knee pain. Recent falls were also denied. Other motor difficulties: Denied. His wife previously reported sporadic tremors in his right hand. She also noted that he will fidget or bounce while sitting, with movement including both his upper and lower extremities. He reported that this is longstanding in nature.  Tremors had remained slight in the time since his previous evaluation.  Sleep History: Estimated hours obtained each night: 7 hours. His wife noted that he will commonly doze off during the day and then be awake throughout portions of the night, making a numerical estimation more challenging to determine.  Difficulties falling asleep: Denied. Difficulties staying asleep: Denied outside of occasionally awakening to use the restroom.  Feels rested and refreshed upon awakening: Endorsed.   History of snoring: Denied. History of waking up gasping for air: Denied. Witnessed breath cessation while asleep: Denied.   History of vivid dreaming: Denied. Excessive movement while asleep: Denied. Instances of acting out his dreams: Denied.  Psychiatric/Behavioral Health History: Depression: He described his current mood as "pretty good" and denied to his knowledge any prior mental health concerns or diagnoses. Current or remote suicidal ideation, intent, or plan was denied.  Anxiety: Denied. Mania: Denied. Trauma History: Denied. Visual/auditory hallucinations: Denied. Delusional thoughts: Denied.   Tobacco: Denied. Alcohol: He previously reported consuming 1-2 mixed drinks per night and denied a history of problematic alcohol abuse or dependence. Per his wife, this amount was accurate and has remained stable over time.  Recreational drugs: Denied.  Family History: History reviewed. No pertinent family history. This information was confirmed by Mr. Bott.  Academic/Vocational History: Highest level of educational attainment: 16 years. He completed high school and earned a Bachelor's degree in zoology. He described himself as a good (A/B) student in academic settings. No relative weaknesses were identified.  History of developmental delay: Denied. History of grade repetition: Denied. Enrollment in special education courses: Denied. History of LD/ADHD: Denied.   Employment: Retired. 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   Evaluation Results:   Behavioral Observations: Mr. Zinger was accompanied by his wife, arrived to his appointment on time, and was appropriately dressed and groomed. He appeared alert. Observed gait and station were within normal limits. Gross motor functioning appeared intact upon informal observation and no abnormal movements (e.g., tremors) were noted. His affect was generally relaxed and positive. He was observed to commonly laugh whenever his wife would provide examples of cognitive and functional decline or impairment during interview. Spontaneous speech was fluent but fairly minimal in content. In this content, word finding difficulties were not observed. Thought processes were normal in content in the context of minimal responding. Insight into his cognitive difficulties appeared very poor and I do not believe he has appropriate insight into the extent of ongoing cognitive and functional impairments.   During testing, sustained attention was appropriate. Task engagement was adequate and he persisted when challenged. Overall, Mr. Trame was cooperative with the clinical interview and subsequent testing procedures.   Adequacy of Effort: The validity of neuropsychological testing is limited by the extent to which the individual being tested may be assumed to have exerted adequate effort during testing. Mr. Zanin expressed his intention to perform to the best of his abilities and exhibited adequate task engagement and persistence. Scores across stand-alone and embedded performance validity measures were variable but largely within expectation. His sole below expectation performance is believed to be due to true, severe memory impairment rather than poor engagement or attempts to perform poorly. As such, the results of the current evaluation are believed to be a valid representation of Mr. Teahan current cognitive functioning.  Test  Results: Mr. Scheidecker was extremely disoriented at the time of the current evaluation. He was unable to state his address (including the city which he resides) or phone number. He was also unable to state the current month,  date, day of the week, name of the current clinic, or the city the current clinic is located in.   Intellectual abilities based upon educational and vocational attainment were estimated to be in the average range. Premorbid abilities were estimated to be within the well above average range based upon a single-word reading test.   Processing speed was exceptionally low to well below average outside of an adequate performance across a rapid word reading task. Basic attention was above average. More complex attention (e.g., working memory) was below average to average. Executive functioning was exceptionally low outside of an adequate performance across an abstract reasoning task. He performed in the well below average range across a task assessing safety and judgment. Points were lost across this task primarily due to the provision of overly vague responses.   Assessed receptive language abilities were below average. Despite his below average score, he only missed a total of two items across this task, suggesting more of a normative deficit rather than evidence for a clinical deficit. Assessed expressive language was variable. Phonemic fluency was below average, semantic fluency was exceptionally low, and confrontation naming was average across a screening task but exceptionally low across a more comprehensive task.      Assessed visuospatial/visuoconstructional abilities were average to well above average.    Learning (i.e., encoding) of novel verbal information was exceptionally low to well below average. Spontaneous delayed recall (i.e., retrieval) of previously learned information was exceptionally low. Retention rates were 50% (raw score of 2) across a story learning task, 0% across a  list learning task, and 0% across a figure drawing task. Performance across recognition tasks was exceptionally low to well below average, suggesting negligible evidence for information consolidation.   Results of emotional screening instruments suggested that recent symptoms of generalized anxiety were in the minimal range, while symptoms of depression were within normal limits. A screening instrument assessing recent sleep quality suggested the presence of minimal sleep dysfunction.  Tables of Scores:   Note: This summary of test scores accompanies the interpretive report and should not be considered in isolation without reference to the appropriate sections in the text. Descriptors are based on appropriate normative data and may be adjusted based on clinical judgment. Terms such as "Within Normal Limits" and "Outside Normal Limits" are used when a more specific description of the test score cannot be determined. Descriptors refer to the current evaluation only.        Percentile - Normative Descriptor > 98 - Exceptionally High 91-97 - Well Above Average 75-90 - Above Average 25-74 - Average 9-24 - Below Average 2-8 - Well Below Average < 2 - Exceptionally Low        Validity: January 2023 Current  DESCRIPTOR  Dot Counting Test: --- --- --- Within Normal Limits  RBANS Effort Index: --- --- --- Outside Normal Limits  WAIS-IV Reliable Digit Span: --- --- --- Within Normal Limits        Orientation:       Raw Score Raw Score Percentile   NAB Orientation, Form 1 20/29 16/29 --- ---        Cognitive Screening:       Raw Score Raw Score Percentile   SLUMS: 14/30 18/30 --- ---        RBANS, Form A: Standard Score/ Scaled Score Standard Score/ Scaled Score Percentile   Total Score 74 71 3 Well Below Average  Immediate Memory 69 65 1 Exceptionally Low    List Learning 4 5  5 Well Below Average    Story Memory 5 3 1  Exceptionally Low  Visuospatial/Constructional 92 126 96 Well Above  Average    Figure Copy 12 14 91 Well Above Average    Line Orientation 12/20 19/20 >75 Above Average  Language 74 74 4 Well Below Average    Picture Naming 10/10 9/10 26-50 Average    Semantic Fluency 1 1 <1 Exceptionally Low  Attention 109 82 12 Below Average    Digit Span 16 12 75 Above Average    Coding 7 2 <1 Exceptionally Low  Delayed Memory 52 40 <1 Exceptionally Low    List Recall 0/10 0/10 <2 Exceptionally Low    List Recognition 16/20 10/20 <2 Exceptionally Low    Story Recall 2 3 1  Exceptionally Low    Story Recognition 9/12 7/12 5-7 Well Below Average    Figure Recall 1 1 <1 Exceptionally Low    Figure Recognition 4/8 3/8 4-8 Well Below Average         Intellectual Functioning:       Standard Score Standard Score Percentile   Test of Premorbid Functioning: 123 123 94 Well Above Average        Attention/Executive Function:      Trail Making Test (TMT): Raw Score (Scaled Score) Raw Score (Scaled Score) Percentile     Part A 44 secs.,  0 errors (9) 53 secs.,  1 error (8) 25 Well Below Average    Part B 156 secs.,  2 errors (8) 266 secs.,  1 error (2) <1 Exceptionally Low  *Based on Mayo's Older Normative Studies (MOANS)             Scaled Score Scaled Score Percentile   WAIS-IV Digit Span: 11 9 37 Average    Forward 13 12 75 Above Average    Backward 6 6 9  Below Average    Sequencing 11 9 37 Average         Scaled Score Scaled Score Percentile   WAIS-IV Similarities: 9 9 37 Average        D-KEFS Color-Word Interference Test: Raw Score (Scaled Score) Raw Score (Scaled Score) Percentile     Color Naming 44 secs. (6) 49 secs. (4) 2 Well Below Average    Word Reading 27 secs. (9) 26 secs. (10) 50 Average    Inhibition 169 secs. (1) Discontinued --- Impaired      Total Errors 17 errors (1) --- --- ---    Inhibition/Switching Discontinued Discontinued --- Impaired      Total Errors --- --- --- ---        NAB Executive Functions Module, Form 1: T Score T Score  Percentile     Judgment 40 33 5 Well Below Average        Language:      Verbal Fluency Test: Raw Score (Scaled Score) Raw Score (Scaled Score) Percentile     Phonemic Fluency (CFL) 21 (6) 16 (6) 9 Below Average    Category Fluency 15 (3) 10 (2) <1 Exceptionally Low  *Based on Mayo's Older Normative Studies (MOANS)            NAB Language Module, Form 1: T Score T Score Percentile     Auditory Comprehension 53 40 16 Below Average    Naming 29/31 (46) 24/31 (23) <1 Exceptionally Low        Visuospatial/Visuoconstruction:       Raw Score Raw Score Percentile   Clock Drawing: 9/10 9/10 --- Within  Normal Limits         Scaled Score Scaled Score Percentile   WAIS-IV Block Design: 13 13 84 Above Average        Mood and Personality:       Raw Score Raw Score Percentile   Geriatric Depression Scale: 1 8 --- Within Normal Limits  Geriatric Anxiety Scale: 5 0 --- Minimal    Somatic 2 0 --- Minimal    Cognitive 1 0 --- Minimal    Affective 2 0 --- Minimal        Additional Questionnaires:       Raw Score Raw Score Percentile   PROMIS Sleep Disturbance Questionnaire: 8 10 --- None to Slight   Informed Consent and Coding/Compliance:   The current evaluation represents a clinical evaluation for the purposes previously outlined by the referral source and is in no way reflective of a forensic evaluation.   Mr. Samec was provided with a verbal description of the nature and purpose of the present neuropsychological evaluation. Also reviewed were the foreseeable risks and/or discomforts and benefits of the procedure, limits of confidentiality, and mandatory reporting requirements of this provider. The patient was given the opportunity to ask questions and receive answers about the evaluation. Oral consent to participate was provided by the patient.   This evaluation was conducted by Newman Nickels, Ph.D., ABPP-CN, board certified clinical neuropsychologist. Mr. Claverie completed a clinical  interview with Dr. Milbert Coulter, billed as one unit [phone], and 130 minutes of cognitive testing and scoring, billed as one unit [phone] and three additional units [id number]. Psychometrist Wallace Keller, B.S., assisted Dr. Milbert Coulter with test administration and scoring procedures. As a separate and discrete service, one unit [id number] and two units [phone] were billed for Dr. Tammy Sours time spent in interpretation and report writing.

## 2023-03-18 NOTE — Telephone Encounter (Signed)
LVM for patient's wife stating that the correct walker was orders and to check with [employer] as to why they were not sent the correct one.

## 2023-03-25 ENCOUNTER — Ambulatory Visit: Payer: Federal, State, Local not specified - PPO | Admitting: Psychology

## 2023-03-25 DIAGNOSIS — F0280 Dementia in other diseases classified elsewhere without behavioral disturbance: Secondary | ICD-10-CM | POA: Diagnosis not present

## 2023-03-25 DIAGNOSIS — G309 Alzheimer's disease, unspecified: Secondary | ICD-10-CM

## 2023-03-25 NOTE — Progress Notes (Signed)
   Neuropsychology Feedback Session Jason Estrada. Jason Estrada Jason Estrada  Reason for Referral:   Jason Estrada is a 82 y.o. 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   Feedback:   Jason Estrada completed a comprehensive neuropsychological evaluation on 03/17/2023. Please refer to that encounter for the full report and recommendations. Briefly, results suggested severe impairment surrounding all aspects of learning and memory. Additional impairments were exhibited across processing speed, several aspects of executive functioning (i.e., cognitive flexibility, response inhibition, and safety/judgment), and semantic fluency. Performance variability was exhibited across confrontation naming. Relative to Jason previous evaluation in January 2023, cognitive decline was exhibited across several domains including processing speed, executive functioning, semantic fluency, confrontation naming, and recognition/consolidation aspects of memory. Broadly speaking, the rate of decline appeared relatively mild. Stability was exhibited across other assessed domains. Regarding etiology, I continue to have primary concerns surrounding underlying Alzheimer's disease (i.e., dementia due to Alzheimer's disease). Across memory testing, Jason Estrada did not benefit from repeated exposure to information across learning trials, was essentially amnestic across memory tasks after a short delay, and performed poorly across yes/no recognition tasks. This suggests the presence of rapid forgetting and a significant memory storage deficit, both of which are hallmark characteristics of Alzheimer's disease. Evidence for progressive cognitive decline over the past 18  months also elevates concerns for the presence of a neurodegenerative illness, as does this decline largely occurring in domains commonly impacted by this illness (e.g., memory, semantic fluency, confrontation naming, executive functioning). Jason Estrada's reporting of him rapidly forgetting information and repeating himself continues to align with this illness as well. Overall, this would appear the most likely culprit for ongoing cognitive impairment and progressive decline.   Jason Estrada was accompanied by Jason Estrada during the current telephone call. He was within Jason residence while I was within my office. I discussed the limitations of evaluation and management by telemedicine and the availability of in person appointments. Jason Estrada expressed Jason understanding and agreed to proceed. Content of the current session focused on the results of Jason neuropsychological evaluation. Jason Estrada was given the opportunity to ask questions and Jason questions were answered. He was encouraged to reach out should additional questions arise. A copy of Jason report had been previously accessed by Jason Estrada via MyChart.      One unit [id number] (31 minutes) was billed for Dr. Tammy Sours time spent preparing for, conducting, and documenting the current feedback session with Jason Estrada.

## 2023-05-09 ENCOUNTER — Other Ambulatory Visit: Payer: Federal, State, Local not specified - PPO

## 2023-05-09 DIAGNOSIS — C61 Malignant neoplasm of prostate: Secondary | ICD-10-CM

## 2023-05-10 LAB — PSA: Prostate Specific Ag, Serum: <0.1 ng/mL (ref 0.0–4.0)

## 2023-05-11 ENCOUNTER — Other Ambulatory Visit: Payer: Self-pay | Admitting: Nurse Practitioner

## 2023-05-13 NOTE — Telephone Encounter (Signed)
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)   [place] [facility] [place], Corrie Dandy T, NP   8 months ago Prostate cancer (HCC)   [place] [facility] [place], Corrie Dandy T, NP   1 year ago Dementia due to Alzheimer's disease (HCC)   [place] [facility] Mecum, Oswaldo Conroy, PA-C   1 year ago Dementia due to Alzheimer's disease (HCC)   [place] [facility] [name], [name], MD   1 year ago High prostate specific antigen (PSA)   [place] [facility] [name], [name], MD       Future Appointments             In 2 days Richardo Hanks, Laurette Schimke, MD [facility] [place]   In 3 months [place], Dorie Rank, NP  [facility]

## 2023-05-14 ENCOUNTER — Telehealth: Payer: Self-pay | Admitting: Physician Assistant

## 2023-05-15 ENCOUNTER — Ambulatory Visit (INDEPENDENT_AMBULATORY_CARE_PROVIDER_SITE_OTHER): Payer: Federal, State, Local not specified - PPO | Admitting: Urology

## 2023-05-15 ENCOUNTER — Encounter: Payer: Self-pay | Admitting: Urology

## 2023-05-15 VITALS — BP 134/76 | HR 72 | Ht 72.0 in

## 2023-05-15 DIAGNOSIS — C61 Malignant neoplasm of prostate: Secondary | ICD-10-CM

## 2023-05-15 MED ORDER — LEUPROLIDE ACETATE (6 MONTH) 45 MG ~~LOC~~ KIT
45.0000 mg | PACK | Freq: Once | SUBCUTANEOUS | Status: AC
Start: 2023-05-15 — End: 2023-05-15
  Administered 2023-05-15: 45 mg via SUBCUTANEOUS

## 2023-05-15 NOTE — Progress Notes (Signed)
   05/15/2023 1:34 PM   [name] [birth year] [id number]  Reason for visit: Follow up high risk prostate cancer, CKD  HPI: 82 year old male with a number of co-morbidities including dementia and stage IV CKD who was found to have an elevated PSA of 50 that remained elevated on recheck.  He underwent a prostate MRI that showed a 38 g prostate with a PI-RADS 5 lesion in the right anterior mid gland tracking towards the base with suspected early extracapsular disease along the right anterior lateral margin of the prostate, as well as a PI-RADS 4 lesion at the left prostate at the margin of the transition zone.  There was no lymphadenopathy or bony lesion in the pelvis.  He underwent a cognitive MRI biopsy 10/25/2021 that showed grade group 2, Gleason score 3+4=7 disease.  PSMA PET scan showed no evidence of metastatic disease.  Using shared decision making, he opted for 2 years of ADT with external beam radiation.  This was completed in June 2023, and initial ADT injection was given 12/10/2021.  He did very well with radiation and really denies any side effects.  He had some mild fatigue on the ADT but overall doing well.  He denies any urinary complaints or gross hematuria.  PSA 05/09/2023 remains undetectable.  We discussed the need to continue ADT for 2 years, and wants of monitoring the PSA trend, and possible need for additional treatments in the future.  56-month ADT injection given today(final dose today to complete 2-year total) RTC 6 months PSA prior   Sondra Come, MD  [facility] [address], [address] [place], [place] [address] [phone]

## 2023-05-15 NOTE — Progress Notes (Signed)
Eligard SubQ Injection   Due to Prostate Cancer patient is present today for a Eligard Injection.  Medication: Eligard 6 month Dose: 45 mg  Location: right  Lot: [id number] Exp: 10/16/2024  Patient tolerated well, no complications were noted.  Performed by: Debbe Bales, CMA   Per Dr. Richardo Hanks today is the patient's last Eligard injection. The patient was given a reminder continue on Vitamin D 800-1000iu and Calcium 1000-1200mg  daily while on Androgen Deprivation Therapy.  PA approval dates: No PA required

## 2023-05-29 ENCOUNTER — Ambulatory Visit: Payer: Federal, State, Local not specified - PPO | Admitting: Physician Assistant

## 2023-06-12 ENCOUNTER — Other Ambulatory Visit: Payer: Self-pay | Admitting: Nurse Practitioner

## 2023-06-12 NOTE — Telephone Encounter (Signed)
Requested medication (s) are due for refill today: Due 06/18/23  Requested medication (s) are on the active medication list: yes    Last refill: 09/17/22  #90  2 refills  Future visit scheduled yes 08/21/23  Notes to clinic:Failed due to labs, please review. Thank you.  Requested Prescriptions  Pending Prescriptions Disp Refills   baclofen (LIORESAL) 10 MG tablet [Pharmacy Med Name: BACLOFEN 10 MG TABLET] 90 tablet 2    Sig: TAKE 1 TABLET BY MOUTH EVERY DAY AS NEEDED     Analgesics:  Muscle Relaxants - baclofen Failed - 06/12/2023  1:37 AM      Failed - Cr in normal range and within 180 days    Creatinine, Ser  Date Value Ref Range Status  08/06/2021 2.06 (H) 0.76 - 1.27 mg/dL Final         Failed - eGFR is 30 or above and within 180 days    GFR, Estimated  Date Value Ref Range Status  04/23/2021 24 (L) >60 mL/min Final    Comment:    (NOTE) Calculated using the CKD-EPI Creatinine Equation (2021)    eGFR  Date Value Ref Range Status  08/06/2021 32 (L) >59 mL/min/1.73 Final         Passed - Valid encounter within last 6 months    Recent Outpatient Visits           3 months ago 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)   [place] [facility] [place], Corrie Dandy T, NP   9 months ago Prostate cancer (HCC)   [place] [facility] [place], Corrie Dandy T, NP   1 year ago Dementia due to Alzheimer's disease (HCC)   [place] [facility] Mecum, Oswaldo Conroy, PA-C   1 year ago Dementia due to Alzheimer's disease (HCC)   [place] [facility] [name], [name], MD   1 year ago High prostate specific antigen (PSA)   [place] [facility] [name], [name], MD       Future Appointments             In 2 months [place], Dorie Rank, NP  [facility]   In 5 months Eva, Laurette Schimke, MD [facility] [place]

## 2023-08-16 NOTE — Patient Instructions (Signed)
Influenza (Flu) Vaccine (Inactivated or Recombinant): What You Need to Know Many vaccine information statements are available in Spanish and other languages. See PromoAge.com.br. 1. Why get vaccinated? Influenza vaccine can prevent influenza (flu). Flu is a contagious disease that spreads around the [place] every year, usually between October and May. Anyone can get the flu, but it is more dangerous for some people. Infants and young children, people 58 years and older, pregnant people, and people with certain health conditions or a weakened immune system are at greatest risk of flu complications. Pneumonia, bronchitis, sinus infections, and ear infections are examples of flu-related complications. If you have a medical condition, such as heart disease, cancer, or diabetes, flu can make it worse. Flu can cause fever and chills, sore throat, muscle aches, fatigue, cough, headache, and runny or stuffy nose. Some people may have vomiting and diarrhea, though this is more common in children than adults. In an average year, thousands of people in the [place] die from flu, and many more are hospitalized. Flu vaccine prevents millions of illnesses and flu-related visits to the doctor each year. 2. Influenza vaccines CDC recommends everyone 6 months and older get vaccinated every flu season. Children 6 months through 51 years of age may need 2 doses during a single flu season. Everyone else needs only 1 dose each flu season. It takes about 2 weeks for protection to develop after vaccination. There are many flu viruses, and they are always changing. Each year a new flu vaccine is made to protect against the influenza viruses believed to be likely to cause disease in the upcoming flu season. Even when the vaccine doesn't exactly match these viruses, it may still provide some protection. Influenza vaccine does not cause flu. Influenza vaccine may be given at the same time as other vaccines. 3.  Talk with your health care provider Tell your vaccination provider if the person getting the vaccine: Has had an allergic reaction after a previous dose of influenza vaccine, or has any severe, life-threatening allergies Has ever had Guillain-Barr Syndrome (also called "GBS") In some cases, your health care provider may decide to postpone influenza vaccination until a future visit. Influenza vaccine can be administered at any time during pregnancy. People who are or will be pregnant during influenza season should receive inactivated influenza vaccine. People with minor illnesses, such as a cold, may be vaccinated. People who are moderately or severely ill should usually wait until they recover before getting influenza vaccine. Your health care provider can give you more information. 4. Risks of a vaccine reaction Soreness, redness, and swelling where the shot is given, fever, muscle aches, and headache can happen after influenza vaccination. There may be a very small increased risk of Guillain-Barr Syndrome (GBS) after inactivated influenza vaccine (the flu shot). Young children who get the flu shot along with pneumococcal vaccine (PCV13) and/or DTaP vaccine at the same time might be slightly more likely to have a seizure caused by fever. Tell your health care provider if a child who is getting flu vaccine has ever had a seizure. People sometimes faint after medical procedures, including vaccination. Tell your provider if you feel dizzy or have vision changes or ringing in the ears. As with any medicine, there is a very remote chance of a vaccine causing a severe allergic reaction, other serious injury, or death. 5. What if there is a serious problem? An allergic reaction could occur after the vaccinated person leaves the clinic. If you see signs of  a severe allergic reaction (hives, swelling of the face and throat, difficulty breathing, a fast heartbeat, dizziness, or weakness), call 9-1-1 and get  the person to the nearest hospital. For other signs that concern you, call your health care provider. Adverse reactions should be reported to the Vaccine Adverse Event Reporting System (VAERS). Your health care provider will usually file this report, or you can do it yourself. Visit the VAERS website at www.vaers.LAgents.no or call (774)365-3656. VAERS is only for reporting reactions, and VAERS staff members do not give medical advice. 6. The National Vaccine Injury Compensation Program The [employer] Vaccine Injury Compensation Program (VICP) is a federal program that was created to compensate people who may have been injured by certain vaccines. Claims regarding alleged injury or death due to vaccination have a time limit for filing, which may be as short as two years. Visit the VICP website at SpiritualWord.at or call 3187036210 to learn about the program and about filing a claim. 7. How can I learn more? Ask your health care provider. Call your local or state health department. Visit the website of the Food and Drug Administration (FDA) for vaccine package inserts and additional information at FinderList.no. Contact the Centers for Disease Control and Prevention (CDC): Call 787-694-5930 (1-800-CDC-INFO) or Visit CDC's website at BiotechRoom.com.cy. Source: CDC Vaccine Information Statement Inactivated Influenza Vaccine (04/21/2020) This same material is available at FootballExhibition.com.br for no charge. This information is not intended to replace advice given to you by your health care provider. Make sure you discuss any questions you have with your health care provider. Document Revised: 12/18/2022 Document Reviewed: 09/23/2022 Elsevier Patient Education  2024 [employer].

## 2023-08-21 ENCOUNTER — Encounter: Payer: Self-pay | Admitting: Nurse Practitioner

## 2023-08-21 ENCOUNTER — Ambulatory Visit: Payer: Federal, State, Local not specified - PPO | Admitting: Nurse Practitioner

## 2023-08-21 VITALS — BP 136/76 | HR 109 | Temp 97.7°F | Ht 72.0 in | Wt 201.2 lb

## 2023-08-21 DIAGNOSIS — Z23 Encounter for immunization: Secondary | ICD-10-CM

## 2023-08-21 DIAGNOSIS — F0280 Dementia in other diseases classified elsewhere without behavioral disturbance: Secondary | ICD-10-CM

## 2023-08-21 DIAGNOSIS — C61 Malignant neoplasm of prostate: Secondary | ICD-10-CM

## 2023-08-21 DIAGNOSIS — E210 Primary hyperparathyroidism: Secondary | ICD-10-CM | POA: Diagnosis not present

## 2023-08-21 DIAGNOSIS — M25561 Pain in right knee: Secondary | ICD-10-CM

## 2023-08-21 DIAGNOSIS — G8929 Other chronic pain: Secondary | ICD-10-CM

## 2023-08-21 DIAGNOSIS — N184 Chronic kidney disease, stage 4 (severe): Secondary | ICD-10-CM | POA: Diagnosis not present

## 2023-08-21 DIAGNOSIS — E782 Mixed hyperlipidemia: Secondary | ICD-10-CM

## 2023-08-21 DIAGNOSIS — E538 Deficiency of other specified B group vitamins: Secondary | ICD-10-CM

## 2023-08-21 DIAGNOSIS — I10 Essential (primary) hypertension: Secondary | ICD-10-CM

## 2023-08-21 DIAGNOSIS — G309 Alzheimer's disease, unspecified: Secondary | ICD-10-CM | POA: Diagnosis not present

## 2023-08-21 MED ORDER — HYDRALAZINE HCL 50 MG PO TABS: 25.0000 mg | ORAL_TABLET | Freq: Two times a day (BID) | ORAL | 4 refills | Status: DC

## 2023-08-21 MED ORDER — ALLOPURINOL 100 MG PO TABS: 100.0000 mg | ORAL_TABLET | Freq: Every day | ORAL | 4 refills | Status: DC

## 2023-08-21 MED ORDER — PRAVASTATIN SODIUM 40 MG PO TABS: 40.0000 mg | ORAL_TABLET | Freq: Every day | ORAL | 4 refills | Status: DC

## 2023-08-21 MED ORDER — VERAPAMIL HCL ER 240 MG PO TBCR: 240.0000 mg | EXTENDED_RELEASE_TABLET | Freq: Every day | ORAL | 4 refills | Status: DC

## 2023-08-21 MED ORDER — PANTOPRAZOLE SODIUM 20 MG PO TBEC: 20.0000 mg | DELAYED_RELEASE_TABLET | Freq: Every day | ORAL | 4 refills | Status: DC

## 2023-08-21 NOTE — Assessment & Plan Note (Signed)
Chronic, progressive.  On memory care medications per neurology.  Continue these and collaboration with neurology, recent note reviewed.  Ordered wheelchair to assist with mobility and rest time when out and about.  Discussed with his wife if needed in future provider can place a referral to SW to assist with any needs.

## 2023-08-21 NOTE — Assessment & Plan Note (Addendum)
Chronic, ongoing with BP stable on recheck today and at goal on home readings when taken.  Recommend he monitor BP at least a few mornings a week at home and document.  DASH diet at home.  Continue current medication regimen and adjust as needed.  Labs today: up to date with nephrology.

## 2023-08-21 NOTE — Progress Notes (Signed)
BP 136/76 (BP Location: Left Arm, Cuff Size: Normal)   Pulse (!) 109   Temp 97.7 F (36.5 C) (Oral)   Ht 6' (1.829 m)   Wt 201 lb 3.2 oz (91.3 kg)   SpO2 97%   BMI 27.29 kg/m    Subjective:    Patient ID: Jason Estrada, male    DOB: Apr 20, 1941, 82 y.o.   MRN: [id number]  HPI: Jason Estrada is a 82 y.o. male  Chief Complaint  Patient presents with   Dementia   Hyperlipidemia   Hypertension   Gastroesophageal Reflux   Prostate Cancer   Wife present with him at bedside.  HYPERTENSION / HYPERLIPIDEMIA + DEMENTIA Current medications: Pravastatin, Hydralazine, Verapamil. Had a gout flare one week ago mostly in left hand.  Continues Allopurinol and Colchicine.  Followed by neurology with last visit 03/25/23.  Continues Memantine and Aricept. Had one recent fall 1 1/2 weeks ago, no injuries. He slipped out of bed at 1 am.  His wife reports need for wheelchair as is getting difficult to get to attend office and go on outings due to knee pain. Has right knee pain which his wife reports is worsening and is barely walking. Has done PT at home but does not consistently stick to routine. Did go to ortho on 08/23/22, received cortisone shots, these only helped for short period and they have not recommended surgery for his right knee.  Is able to do many things on own at home: dressing (although becoming a struggle), showering, and toileting. Does not drive. Continues to eat meals well at home, but is binging often in between. Is sleeping a lot more, but is wandering at night and eating. Is taking B12 daily. Satisfied with current treatment? yes Duration of hypertension: chronic BP monitoring frequency: occasional BP range: <130/80 range BP medication side effects: no Duration of hyperlipidemia: chronic Cholesterol medication side effects: no Cholesterol supplements: none Medication compliance: good compliance Aspirin: no Recent stressors: no Recurrent headaches: no Visual changes:  no Palpitations: no Dyspnea: no Chest pain: no Lower extremity edema: no Dizzy/lightheaded: no   CHRONIC KIDNEY DISEASE LAst saw nephrology on 06/26/23 -- labs CRT 2.11, eGFR 31, BUN 28, HGB 12.2, PTH 100.  Saw urology for prostate cancer with last visit 05/15/23, radiation completed 02/15/22.  He received last injection at that visit and is to return every 6 months.  Current CrCl 36. CKD status: stable Medications renally dose: yes Previous renal evaluation: yes Pneumovax:  Up to Date Influenza Vaccine:  Up to Date   Relevant past medical, surgical, family and social history reviewed and updated as indicated. Interim medical history since our last visit reviewed. Allergies and medications reviewed and updated.  Review of Systems  Constitutional:  Negative for activity change, diaphoresis, fatigue and fever.  Respiratory:  Negative for cough, chest tightness, shortness of breath and wheezing.   Cardiovascular:  Negative for chest pain, palpitations and leg swelling.  Gastrointestinal: Negative.   Musculoskeletal:  Positive for arthralgias.  Psychiatric/Behavioral: Negative.      Per HPI unless specifically indicated above     Objective:    BP 136/76 (BP Location: Left Arm, Cuff Size: Normal)   Pulse (!) 109   Temp 97.7 F (36.5 C) (Oral)   Ht 6' (1.829 m)   Wt 201 lb 3.2 oz (91.3 kg)   SpO2 97%   BMI 27.29 kg/m   Wt Readings from Last 3 Encounters:  08/21/23 201 lb 3.2 oz (91.3 kg)  02/19/23 203  lb 3.2 oz (92.2 kg)  01/21/23 204 lb (92.5 kg)    Physical Exam Vitals and nursing note reviewed.  Constitutional:      General: He is awake. He is not in acute distress.    Appearance: He is well-developed and well-groomed. He is not ill-appearing or toxic-appearing.  HENT:     Head: Normocephalic and atraumatic.     Right Ear: Hearing and external ear normal. No drainage.     Left Ear: Hearing and external ear normal. No drainage.  Eyes:     General: Lids are normal.         Right eye: No discharge.        Left eye: No discharge.     Conjunctiva/sclera: Conjunctivae normal.     Pupils: Pupils are equal, round, and reactive to light.  Neck:     Thyroid: No thyromegaly.     Vascular: No carotid bruit or JVD.     Trachea: Trachea normal.  Cardiovascular:     Rate and Rhythm: Normal rate and regular rhythm.     Heart sounds: Normal heart sounds, S1 normal and S2 normal. No murmur heard.    No gallop.     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  Negative Homans. Pulmonary:     Effort: Pulmonary effort is normal. No accessory muscle usage or respiratory distress.     Breath sounds: Normal breath sounds.  Abdominal:     General: Bowel sounds are normal.     Palpations: Abdomen is soft. There is no hepatomegaly or splenomegaly.  Musculoskeletal:     Cervical back: Normal range of motion and neck supple.     Right knee: Swelling (baseline) and crepitus present. No bony tenderness. Decreased range of motion. Tenderness (diffuse) present. Normal pulse.     Left knee: Normal.     Right lower leg: 1+ Edema present.     Left lower leg: No edema.     Comments: Antalgic gait present -- not using cane or walker.  Skin:    General: Skin is warm and dry.     Capillary Refill: Capillary refill takes less than 2 seconds.     Findings: No rash.  Neurological:     Mental Status: He is alert and oriented to person, place, and time.     Cranial Nerves: Cranial nerves 2-12 are intact.     Gait: Gait abnormal.     Deep Tendon Reflexes:     Reflex Scores:      Brachioradialis reflexes are 2+ on the right side and 2+ on the left side.      Patellar reflexes are 2+ on the right side and 2+ on the left side.    Comments: Takes time on recall, but overall oriented today.  Psychiatric:        Attention and Perception: Attention normal.        Mood and Affect: Mood normal.        Speech: Speech normal.        Behavior: Behavior normal.  Behavior is cooperative.        Thought Content: Thought content normal.    Results for orders placed or performed in visit on 05/09/23  PSA  Result Value Ref Range   Prostate Specific Ag, Serum <0.1 0.0 - 4.0 ng/mL      Assessment & Plan:   Problem List Items Addressed This Visit       Cardiovascular and Mediastinum  Essential hypertension    Chronic, ongoing with BP stable on recheck today and at goal on home readings when taken.  Recommend he monitor BP at least a few mornings a week at home and document.  DASH diet at home.  Continue current medication regimen and adjust as needed.  Labs today: up to date with nephrology.       Relevant Medications   hydrALAZINE (APRESOLINE) 50 MG tablet   pravastatin (PRAVACHOL) 40 MG tablet   verapamil (CALAN-SR) 240 MG CR tablet     Endocrine   Primary hyperparathyroidism    Chronic, ongoing.  Followed by nephrology, continue this collaboration.  Recent notes and labs reviewed.        Nervous and Auditory   Dementia due to Alzheimer's disease    Chronic, progressive.  On memory care medications per neurology.  Continue these and collaboration with neurology, recent note reviewed.  Ordered wheelchair to assist with mobility and rest time when out and about.  Discussed with his wife if needed in future provider can place a referral to SW to assist with any needs.      Relevant Orders   DME Wheelchair manual     Genitourinary   Chronic kidney disease, stage 4 (severe) - Primary    Chronic, ongoing.  Followed by nephrology, recent notes and labs reviewed.  Continue current medication regimen.      Prostate cancer    Chronic, ongoing.  Followed by urology and continues treatment -- recent notes reviewed.      Relevant Medications   allopurinol (ZYLOPRIM) 100 MG tablet     Other   Chronic pain of right knee    Ongoing issue with worsening, concern for falls. Recommend he use walker or cane consistently.  Ortho has advised  against surgery per wife's report.  At this time will work on obtaining a wheelchair for patient which will make ability to go to appointments on on outings easier + decrease his risk for falls.  He refuses PT.      Hyperlipidemia    Chronic, ongoing.  Continue current medication regimen and adjust as needed.  Lipid panel today.         Relevant Medications   hydrALAZINE (APRESOLINE) 50 MG tablet   pravastatin (PRAVACHOL) 40 MG tablet   verapamil (CALAN-SR) 240 MG CR tablet   Other Relevant Orders   Lipid Panel w/o Chol/HDL Ratio   Vitamin B12 deficiency    Started supplement after last visit and is taking daily.  Recheck level today.      Relevant Orders   Vitamin B12   Other Visit Diagnoses     Flu vaccine need       Flu vaccine in office today, educated on this.        Follow up plan: Return in about 4 months (around 12/20/2023) for HTN/HLD, MEMORY.

## 2023-08-21 NOTE — Assessment & Plan Note (Signed)
Chronic, ongoing.  Followed by nephrology, recent notes and labs reviewed.  Continue current medication regimen.

## 2023-08-21 NOTE — Assessment & Plan Note (Signed)
Chronic, ongoing.  Followed by urology and continues treatment -- recent notes reviewed.

## 2023-08-21 NOTE — Assessment & Plan Note (Signed)
Started supplement after last visit and is taking daily.  Recheck level today.

## 2023-08-21 NOTE — Assessment & Plan Note (Signed)
Ongoing issue with worsening, concern for falls. Recommend he use walker or cane consistently.  Ortho has advised against surgery per wife's report.  At this time will work on obtaining a wheelchair for patient which will make ability to go to appointments on on outings easier + decrease his risk for falls.  He refuses PT.

## 2023-08-21 NOTE — Assessment & Plan Note (Signed)
Chronic, ongoing.  Continue current medication regimen and adjust as needed. Lipid panel today. 

## 2023-08-21 NOTE — Assessment & Plan Note (Signed)
Chronic, ongoing.  Followed by nephrology, continue this collaboration.  Recent notes and labs reviewed. 

## 2023-08-22 LAB — VITAMIN B12: Vitamin B-12: 241 pg/mL (ref 232–1245)

## 2023-08-22 LAB — LIPID PANEL W/O CHOL/HDL RATIO
Cholesterol, Total: 192 mg/dL (ref 100–199)
HDL: 61 mg/dL (ref >39)
LDL Chol Calc (NIH): 114 mg/dL — ABNORMAL HIGH (ref 0–99)
Triglycerides: 94 mg/dL (ref 0–149)
VLDL Cholesterol Cal: 17 mg/dL (ref 5–40)

## 2023-08-22 NOTE — Progress Notes (Signed)
Contacted via MyChart   Good afternoon, Jason Estrada's labs have returned: - B12 is improving but is still a little low, ensure you are taking 1000 MCG a day. - LDL was a bit elevated on Lipid panel, please ensure he is taking Pravastatin daily.  Any questions? Keep being stellar!!  Thank you for allowing me to participate in your care.  I appreciate you. Kindest regards, Kanita Delage

## 2023-08-28 ENCOUNTER — Encounter: Payer: Self-pay | Admitting: Physician Assistant

## 2023-08-28 ENCOUNTER — Ambulatory Visit: Payer: Federal, State, Local not specified - PPO | Admitting: Physician Assistant

## 2023-08-28 VITALS — BP 137/73 | HR 88 | Resp 20 | Ht 72.0 in

## 2023-08-28 DIAGNOSIS — F0280 Dementia in other diseases classified elsewhere without behavioral disturbance: Secondary | ICD-10-CM | POA: Diagnosis not present

## 2023-08-28 DIAGNOSIS — G309 Alzheimer's disease, unspecified: Secondary | ICD-10-CM

## 2023-08-28 MED ORDER — MEMANTINE HCL 10 MG PO TABS: ORAL_TABLET | ORAL | 3 refills | Status: DC

## 2023-08-28 MED ORDER — DONEPEZIL HCL 23 MG PO TABS: 23.0000 mg | ORAL_TABLET | Freq: Every day | ORAL | 11 refills | Status: DC

## 2023-08-28 NOTE — Patient Instructions (Signed)
It was a pleasure to see you today at our office.   Recommendations:  Follow up in  6 months Increase Donepezil  to  23 mg mg daily   Memantine 10mg  tablets.  Take 1 tablet  twice daily.          Whom to call:  Memory  decline, memory medications: Call our office [phone]   For psychiatric meds, mood meds: Please have your primary care physician manage these medications.   Counseling regarding caregiver distress, including caregiver depression, anxiety and issues regarding community resources, adult day care programs, adult living facilities, or memory care questions:   Feel free to contact Misty Lisabeth Register, Social Worker at [phone]   For assessment of decision of mental capacity and competency:  Call Dr. Erick Blinks, geriatric psychiatrist at [phone]  For guidance in geriatric dementia issues please call [facility] [phone]  For guidance regarding [facility] and if placement were needed at the facility, contact Sidney Ace, Social Worker tel: [phone]  If you have any severe symptoms of a stroke, or other severe issues such as confusion,severe chills or fever, etc call 911 or go to the ER as you may need to be evaluated further     RECOMMENDATIONS FOR ALL PATIENTS WITH MEMORY PROBLEMS: 1. Continue to exercise (Recommend 30 minutes of walking everyday, or 3 hours every week) 2. Increase social interactions - continue going to [place] and enjoy social gatherings with friends and family 3. Eat healthy, avoid fried foods and eat more fruits and vegetables 4. Maintain adequate blood pressure, blood sugar, and blood cholesterol level. Reducing the risk of stroke and cardiovascular disease also helps promoting better memory. 5. Avoid stressful situations. Live a simple life and avoid aggravations. Organize your time and prepare for the next day in anticipation. 6. Sleep well, avoid any interruptions of sleep and avoid any  distractions in the bedroom that may interfere with adequate sleep quality 7. Avoid sugar, avoid sweets as there is a strong link between excessive sugar intake, diabetes, and cognitive impairment We discussed the Mediterranean diet, which has been shown to help patients reduce the risk of progressive memory disorders and reduces cardiovascular risk. This includes eating fish, eat fruits and green leafy vegetables, nuts like almonds and hazelnuts, walnuts, and also use olive oil. Avoid fast foods and fried foods as much as possible. Avoid sweets and sugar as sugar use has been linked to worsening of memory function.  There is always a concern of gradual progression of memory problems. If this is the case, then we may need to adjust level of care according to patient needs. Support, both to the patient and caregiver, should then be put into place.    The Alzheimer's Association is here all day, every day for people facing Alzheimer's disease through our free 24/7 Helpline: [phone]. The Helpline provides reliable information and support to all those who need assistance, such as individuals living with memory loss, Alzheimer's or other dementia, caregivers, health care professionals and the public.  Our highly trained and knowledgeable staff can help you with: Understanding memory loss, dementia and Alzheimer's  Medications and other treatment options  General information about aging and brain health  Skills to provide quality care and to find the best care from professionals  Legal, financial and living-arrangement decisions Our Helpline also features: Confidential care consultation provided by master's level clinicians who can help with decision-making support, crisis assistance and education on issues families face every day  Help in a caller's preferred language using our translation service that features more than 200 languages and dialects  Referrals to local community programs, services and  ongoing support     FALL PRECAUTIONS: Be cautious when walking. Scan the area for obstacles that may increase the risk of trips and falls. When getting up in the mornings, sit up at the edge of the bed for a few minutes before getting out of bed. Consider elevating the bed at the head end to avoid drop of blood pressure when getting up. Walk always in a well-lit room (use night lights in the walls). Avoid area rugs or power cords from appliances in the middle of the walkways. Use a walker or a cane if necessary and consider physical therapy for balance exercise. Get your eyesight checked regularly.  FINANCIAL OVERSIGHT: Supervision, especially oversight when making financial decisions or transactions is also recommended.  HOME SAFETY: Consider the safety of the kitchen when operating appliances like stoves, microwave oven, and blender. Consider having supervision and share cooking responsibilities until no longer able to participate in those. Accidents with firearms and other hazards in the house should be identified and addressed as well.   ABILITY TO BE LEFT ALONE: If patient is unable to contact 911 operator, consider using LifeLine, or when the need is there, arrange for someone to stay with patients. Smoking is a fire hazard, consider supervision or cessation. Risk of wandering should be assessed by caregiver and if detected at any point, supervision and safe proof recommendations should be instituted.  MEDICATION SUPERVISION: Inability to self-administer medication needs to be constantly addressed. Implement a mechanism to ensure safe administration of the medications.       Mediterranean Diet A Mediterranean diet refers to food and lifestyle choices that are based on the traditions of countries located on the [employer] [place]. This way of eating has been shown to help prevent certain conditions and improve outcomes for people who have chronic diseases, like kidney disease and heart  disease. What are tips for following this plan? Lifestyle  Cook and eat meals together with your family, when possible. Drink enough fluid to keep your urine clear or pale yellow. Be physically active every day. This includes: Aerobic exercise like running or swimming. Leisure activities like gardening, walking, or housework. Get 7-8 hours of sleep each night. If recommended by your health care provider, drink red wine in moderation. This means 1 glass a day for nonpregnant women and 2 glasses a day for men. A glass of wine equals 5 oz (150 mL). Reading food labels  Check the serving size of packaged foods. For foods such as rice and pasta, the serving size refers to the amount of cooked product, not dry. Check the total fat in packaged foods. Avoid foods that have saturated fat or trans fats. Check the ingredients list for added sugars, such as corn syrup. Shopping  At the grocery store, buy most of your food from the areas near the walls of the store. This includes: Fresh fruits and vegetables (produce). Grains, beans, nuts, and seeds. Some of these may be available in unpackaged forms or large amounts (in bulk). Fresh seafood. Poultry and eggs. Low-fat dairy products. Buy whole ingredients instead of prepackaged foods. Buy fresh fruits and vegetables in-season from local farmers markets. Buy frozen fruits and vegetables in resealable bags. If you do not have access to quality fresh seafood, buy precooked frozen shrimp or canned fish, such as tuna, salmon, or sardines. Buy small  amounts of raw or cooked vegetables, salads, or olives from the deli or salad bar at your store. Stock your pantry so you always have certain foods on hand, such as olive oil, canned tuna, canned tomatoes, rice, pasta, and beans. Cooking  Cook foods with extra-virgin olive oil instead of using butter or other vegetable oils. Have meat as a side dish, and have vegetables or grains as your main dish. This means  having meat in small portions or adding small amounts of meat to foods like pasta or stew. Use beans or vegetables instead of meat in common dishes like chili or lasagna. Experiment with different cooking methods. Try roasting or broiling vegetables instead of steaming or sauteing them. Add frozen vegetables to soups, stews, pasta, or rice. Add nuts or seeds for added healthy fat at each meal. You can add these to yogurt, salads, or vegetable dishes. Marinate fish or vegetables using olive oil, lemon juice, garlic, and fresh herbs. Meal planning  Plan to eat 1 vegetarian meal one day each week. Try to work up to 2 vegetarian meals, if possible. Eat seafood 2 or more times a week. Have healthy snacks readily available, such as: Vegetable sticks with hummus. Greek yogurt. Fruit and nut trail mix. Eat balanced meals throughout the week. This includes: Fruit: 2-3 servings a day Vegetables: 4-5 servings a day Low-fat dairy: 2 servings a day Fish, poultry, or lean meat: 1 serving a day Beans and legumes: 2 or more servings a week Nuts and seeds: 1-2 servings a day Whole grains: 6-8 servings a day Extra-virgin olive oil: 3-4 servings a day Limit red meat and sweets to only a few servings a month What are my food choices? Mediterranean diet Recommended Grains: Whole-grain pasta. Brown rice. Bulgar wheat. Polenta. Couscous. Whole-wheat bread. Orpah Cobb. Vegetables: Artichokes. Beets. Broccoli. Cabbage. Carrots. Eggplant. Green beans. Chard. Kale. Spinach. Onions. Leeks. Peas. Squash. Tomatoes. Peppers. Radishes. Fruits: Apples. Apricots. Avocado. Berries. Bananas. Cherries. Dates. Figs. Grapes. Lemons. Melon. Oranges. Peaches. Plums. Pomegranate. Meats and other protein foods: Beans. Almonds. Sunflower seeds. Pine nuts. Peanuts. Cod. Salmon. Scallops. Shrimp. Tuna. Tilapia. Clams. Oysters. Eggs. Dairy: Low-fat milk. Cheese. Greek yogurt. Beverages: Water. Red wine. Herbal tea. Fats and  oils: Extra virgin olive oil. Avocado oil. Grape seed oil. Sweets and desserts: [place] yogurt with honey. Baked apples. Poached pears. Trail mix. Seasoning and other foods: Basil. Cilantro. Coriander. Cumin. Mint. Parsley. Sage. Rosemary. Tarragon. Garlic. Oregano. Thyme. Pepper. Balsalmic vinegar. Tahini. Hummus. Tomato sauce. Olives. Mushrooms. Limit these Grains: Prepackaged pasta or rice dishes. Prepackaged cereal with added sugar. Vegetables: Deep fried potatoes (french fries). Fruits: Fruit canned in syrup. Meats and other protein foods: Beef. Pork. Lamb. Poultry with skin. Hot dogs. Tomasa Blase. Dairy: Ice cream. Sour cream. Whole milk. Beverages: Juice. Sugar-sweetened soft drinks. Beer. Liquor and spirits. Fats and oils: Butter. Canola oil. Vegetable oil. Beef fat (tallow). Lard. Sweets and desserts: Cookies. Cakes. Pies. Candy. Seasoning and other foods: Mayonnaise. Premade sauces and marinades. The items listed may not be a complete list. Talk with your dietitian about what dietary choices are right for you. Summary The Mediterranean diet includes both food and lifestyle choices. Eat a variety of fresh fruits and vegetables, beans, nuts, seeds, and whole grains. Limit the amount of red meat and sweets that you eat. Talk with your health care provider about whether it is safe for you to drink red wine in moderation. This means 1 glass a day for nonpregnant women and 2 glasses a day for  men. A glass of wine equals 5 oz (150 mL). This information is not intended to replace advice given to you by your health care provider. Make sure you discuss any questions you have with your health care provider. Document Released: 04/25/2016 Document Revised: 05/28/2016 Document Reviewed: 04/25/2016 Elsevier Interactive Patient Education  2017 [employer].

## 2023-08-28 NOTE — Progress Notes (Signed)
Assessment/Plan:   Dementia due to Alzheimer's disease  Jason Estrada is a very pleasant 82 y.o. RH male with a history of dementia likely due to Alzheimer's disease seen today in follow up for memory loss. Patient is currently on memantine 10 mg twice daily and donepezil 10 mg daily.  Unfortunately, cognitive decline is noted.  Discussed increasing donepezil to 23 mg daily in an effort to slow down any cognitive decline, although at this moment, no therapeutic benefit of the medicine is in question he needs more assistance with ADLs.  He is more disoriented than prior, especially with names, and places.      Follow up in 6 months. Increase donepezil to 23 mg daily, side effects discussed Continue memantine 10 mg daily, side effects discussed Recommend good control of her cardiovascular risk factors Continue to replenish B12 Continue to control mood as per PCP     Subjective:    This patient is accompanied in the office by his wife who supplements the history.  Previous records as well as any outside records available were reviewed prior to todays visit. Patient was last seen on May 2024 with last MMSE of 25/30 on March 2024.      Any changes in memory since last visit? "  His memory is much worse than before, does not recognize me at times "-his wife says.  "He has a couple of spells with Jason Estrada confusion ".  "The other day he was asking for Jason Estrada (wife) because he wanted to leave a message on her phone ".  He tries to communicate but he does not remember words.  Does not recall any recent conversations. repeats oneself?  He is less verbal than before. Disoriented when walking into a room?  He may confuse the room at times.   Leaving objects?  May misplace things but not in unusual places   Wandering behavior?  denies.  He shadows his wife. Any personality changes since last visit?  His mood is good, he is no aware of these changes. Any worsening depression?:  Denies.  His mood has  been very good. Hallucinations or paranoia?  Denies.   Seizures? denies    Any sleep changes?  He is not sleeping very well, he confuses days with nights.  Denies vivid dreams, REM behavior or sleepwalking   Sleep apnea?   Denies.   Any hygiene concerns?  Before, he will have to be reminded, now he does not want to take a shower. Independent of bathing and dressing?  He needs some assistance with getting dressed. Does the patient needs help with medications?  Wife is in charge    Who is in charge of the finances?  Wife is in charge     Any changes in appetite?  Eats well    Patient have trouble swallowing? Denies.   Does the patient cook? No Any headaches?   denies   Chronic back pain  denies   Ambulates with difficulty?  He is slower than before and he flexes forward.  He uses a cane although "he should be using a walker "wife says Recent falls or head injuries? denies     Unilateral weakness, numbness or tingling? denies   Any tremors?  Denies   Any anosmia?  Denies   Any incontinence of urine?  Endorsed , wears diapers Any bowel dysfunction?   Denies      Patient lives with his wife  Does the patient drive? No longer drives  Recent labs B12 241,   Neuropsych evaluation 03/2023 Briefly, results suggested severe impairment surrounding all aspects of learning and memory. Additional impairments were exhibited across processing speed, several aspects of executive functioning (i.e., cognitive flexibility, response inhibition, and safety/judgment), and semantic fluency. Performance variability was exhibited across confrontation naming. Relative to his previous evaluation in January 2023, cognitive decline was exhibited across several domains including processing speed, executive functioning, semantic fluency, confrontation naming, and recognition/consolidation aspects of memory. Broadly speaking, the rate of decline appeared relatively mild. Stability was exhibited across other assessed  domains. Regarding etiology, I continue to have primary concerns surrounding underlying Alzheimer's disease (i.e., dementia due to Alzheimer's disease). Across memory testing, Jason Estrada did not benefit from repeated exposure to information across learning trials, was essentially amnestic across memory tasks after a short delay, and performed poorly across yes/no recognition tasks. This suggests the presence of rapid forgetting and a significant memory storage deficit, both of which are hallmark characteristics of Alzheimer's disease. Evidence for progressive cognitive decline over the past 18 months also elevates concerns for the presence of a neurodegenerative illness, as does this decline largely occurring in domains commonly impacted by this illness (e.g., memory, semantic fluency, confrontation naming, executive functioning). His wife's reporting of him rapidly forgetting information and repeating himself continues to align with this illness as well. Overall, this would appear the most likely culprit for ongoing cognitive impairment and progressive decline.   Initial Evaluation 06/04/21 The patient is seen in neurologic consultation at the request of [name], [name], MD for the evaluation of memory.  The patient is accompanied by his wife who supplements the history. 61 y.o. year old male who has had memory issues for about 1 year when he noticed worsening short term memory deficiencies. "He pauses till he can find the word" -wife says.  He misses appointments frequently. He moved from [place] 1 year ago, and his wife would travel from there to [place] during this time, able to see these changes. "This was worse until 4 weeks ago", somehow it is a little bit better but not much". He does repeat the same stories and questions. He denies depression or irritability. His mood is "good". He  likes to play solitaire on his phone, trivia, reading and walking. He sleeps better than when first moving, He has some  vivid dreams without  sleepwalking. At times he confuses the days and nights. "He wanted to eat breakfast at midnight".  In July he was confused, found to have elevated PSA and was referred to a urologist, workup ongoing. He has some urine hesitancy because of prostatic issues. Currently denies hallucinations or paranoia. Denies leaving objects in unusual places.   Wife reports issues with hygiene."Bathing since around July, is a battle, and he likes to use the same underwear". Wife administers the medications and manages the bills, because he fell victim to scams. His appetite is good and denies trouble swallowing, He cooks and denies leaving the stove on. Wife supervises . He ambulates without devices, but is prone to falls due to gout. Denies head injuries.    He drives with GPS and wife is concerned because he relies on it and "still gets lost, took me to the wrong place the other day". He has issues with time gap as well. He believes that he has been in [place] longer, no sense of when and where he is".  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 Denies headaches, double vision, dizziness, focal numbness or tingling, unilateral weakness or tremors.  No constipation or diarrhea. Denies anosmia. Denies history of OSA, ETOH or Tobacco. Family History negative for dementia.   PREVIOUS MEDICATIONS:   CURRENT MEDICATIONS:  Outpatient Encounter Medications as of 08/28/2023  Medication Sig   allopurinol (ZYLOPRIM) 100 MG tablet Take 1 tablet (100 mg total) by mouth daily.   baclofen (LIORESAL) 10 MG tablet TAKE 1 TABLET BY MOUTH EVERY DAY AS NEEDED   colchicine 0.6 MG tablet Take 1 tablet (0.6 mg total) by mouth daily.   folic acid (FOLVITE) 1 MG tablet TAKE 1 TABLET BY MOUTH EVERY DAY   hydrALAZINE (APRESOLINE) 50 MG tablet Take 0.5 tablets (25 mg total) by mouth in the morning and at bedtime.   pantoprazole (PROTONIX) 20 MG tablet  Take 1 tablet (20 mg total) by mouth daily.   pravastatin (PRAVACHOL) 40 MG tablet Take 1 tablet (40 mg total) by mouth daily.   verapamil (CALAN-SR) 240 MG CR tablet Take 1 tablet (240 mg total) by mouth daily.   [DISCONTINUED] donepezil (ARICEPT) 10 MG tablet TAKE 1 TABLET AT 10 MG DAILY   [DISCONTINUED] memantine (NAMENDA) 10 MG tablet Take 1 tab twice a day   calcitRIOL (ROCALTROL) 0.25 MCG capsule TAKE 1 CAPSULE BY MOUTH EVERY DAY (Patient not taking: Reported on 08/28/2023)   donepezil (ARICEPT) 23 MG TABS tablet Take 1 tablet (23 mg total) by mouth daily.   memantine (NAMENDA) 10 MG tablet Take 1 tab twice a day   No facility-administered encounter medications on file as of 08/28/2023.       11/26/2022    4:00 PM 05/14/2022    7:00 AM  MMSE - Mini Mental State Exam  Orientation to time 4 0  Orientation to Place 3 2  Registration 3 3  Attention/ Calculation 4 4  Recall 2 0  Language- name 2 objects 2 2  Language- repeat 1 1  Language- follow 3 step command 3 2  Language- read & follow direction 1 1  Write a sentence 1 0  Copy design 1 1  Total score 25 16      06/01/2021    9:00 AM  Montreal Cognitive Assessment   Visuospatial/ Executive (0/5) 4  Naming (0/3) 3  Attention: Read list of digits (0/2) 2  Attention: Read list of letters (0/1) 0  Attention: Serial 7 subtraction starting at 100 (0/3) 1  Language: Repeat phrase (0/2) 2  Language : Fluency (0/1) 0  Abstraction (0/2) 0  Delayed Recall (0/5) 0  Orientation (0/6) 4  Total 16  Adjusted Score (based on education) 17    Objective:     PHYSICAL EXAMINATION:    VITALS:   Vitals:   08/28/23 1424  Resp: 20  Height: 6' (1.829 m)    GEN:  The patient appears stated age and is in NAD. HEENT:  Normocephalic, atraumatic.   Neurological examination:  General: NAD, well-groomed, appears stated age. Orientation: The patient is alert.  Not oriented to person, place and date Cranial nerves: There is good  facial symmetry.The speech is not fluent but clear. No aphasia or dysarthria. Fund of knowledge is reduced. Recent and remote memory are impaired. Attention and concentration are reduced.  Unable to name objects and repeat phrases.  Hearing is intact to conversational tone. Sensation: Sensation is intact to light touch throughout Motor: Strength is at least antigravity x4. DTR's 2/4 in UE/LE     Movement examination: Tone:  There is normal tone in the UE/LE Abnormal movements:  no tremor.  No myoclonus.  No asterixis.   Coordination:  There is no decremation with RAM's. Normal finger to nose  Gait and Station: The patient has no some difficulty arising out of a deep-seated chair without the use of the hands.  Uses a cane to ambulate, flexes forward.  The patient's stride length is good.  Gait is cautious and narrow.    Thank you for allowing [place] the opportunity to participate in the care of this nice patient. Please do not hesitate to contact us for any questions or concerns.   Total time spent on today's visit was 40 minutes dedicated to this patient today, preparing to see patient, examining the patient, ordering tests and/or medications and counseling the patient, documenting clinical information in the EHR or other health record, independently interpreting results and communicating results to the patient/family, discussing treatment and goals, answering patient's questions and coordinating care.  Cc:  Marjie Skiff, NP  Marlowe Kays 08/28/2023 2:54 PM

## 2023-09-03 ENCOUNTER — Ambulatory Visit: Payer: Self-pay

## 2023-09-03 ENCOUNTER — Telehealth: Payer: Self-pay | Admitting: Nurse Practitioner

## 2023-09-03 ENCOUNTER — Institutional Professional Consult (permissible substitution): Payer: Federal, State, Local not specified - PPO | Admitting: Psychology

## 2023-09-03 NOTE — Telephone Encounter (Signed)
Ok to write order for this?

## 2023-09-03 NOTE — Telephone Encounter (Signed)
Copied from CRM [id number]. Topic: Referral - Status >> Sep 03, 2023  3:57 PM Ja-Kwan M wrote: Reason for CRM: Pt wife stated that she is at the location to get the transport chair but the request was for a manual wheelchair so she needs a referral to be faxed to [phone] for a transport chair.

## 2023-09-03 NOTE — Addendum Note (Signed)
Addended by: Aura Dials T on: 09/03/2023 12:41 PM   Modules accepted: Orders

## 2023-09-03 NOTE — Telephone Encounter (Signed)
Spoke with wife and verified it would be ok to send [facility] and it has been sent there as requested.

## 2023-09-03 NOTE — Telephone Encounter (Signed)
Copied from CRM [id number]. Topic: General - Inquiry >> Sep 03, 2023 10:10 AM Payton Doughty wrote: Reason for CRM: wife is calling to follow up on the referral for a light weight transporter. Wife states she prefers to go to a location in [place].  Wife was hoping to take pt out Christmas Day.  She prefers not to use [employer]

## 2023-09-04 NOTE — Telephone Encounter (Signed)
Written order for the transport chair received for the patient this morning. Order has been signed and faxed back for the patient.

## 2023-09-05 ENCOUNTER — Other Ambulatory Visit: Payer: Self-pay | Admitting: Nurse Practitioner

## 2023-09-08 ENCOUNTER — Telehealth: Payer: Self-pay | Admitting: Nurse Practitioner

## 2023-09-08 NOTE — Telephone Encounter (Signed)
Requested medication (s) are due for refill today - yes  Requested medication (s) are on the active medication list -yes  Future visit scheduled -yes  Last refill: 02/18/23 #90  Notes to clinic: fails lab protocol- over 1 year-08/06/21  Requested Prescriptions  Pending Prescriptions Disp Refills   colchicine 0.6 MG tablet [Pharmacy Med Name: COLCHICINE 0.6 MG TABLET] 90 tablet 0    Sig: TAKE 1 TABLET BY MOUTH EVERY DAY     Endocrinology:  Gout Agents - colchicine Failed - 09/08/2023 10:53 AM      Failed - Cr in normal range and within 360 days    Creatinine, Ser  Date Value Ref Range Status  08/06/2021 2.06 (H) 0.76 - 1.27 mg/dL Final         Failed - ALT in normal range and within 360 days    ALT  Date Value Ref Range Status  08/06/2021 29 0 - 44 IU/L Final         Failed - AST in normal range and within 360 days    AST  Date Value Ref Range Status  08/06/2021 40 0 - 40 IU/L Final         Failed - CBC within normal limits and completed in the last 12 months    WBC  Date Value Ref Range Status  02/06/2022 5.3 4.0 - 10.5 K/uL Final   RBC  Date Value Ref Range Status  02/06/2022 3.94 (L) 4.22 - 5.81 MIL/uL Final   Hemoglobin  Date Value Ref Range Status  02/06/2022 11.9 (L) 13.0 - 17.0 g/dL Final  72/53/6644 03.4 13.0 - 17.7 g/dL Final   HCT  Date Value Ref Range Status  02/06/2022 37.2 (L) 39.0 - 52.0 % Final   Hematocrit  Date Value Ref Range Status  08/06/2021 40.9 37.5 - 51.0 % Final   MCHC  Date Value Ref Range Status  02/06/2022 32.0 30.0 - 36.0 g/dL Final   [facility]  Date Value Ref Range Status  02/06/2022 30.2 26.0 - 34.0 pg Final   MCV  Date Value Ref Range Status  02/06/2022 94.4 80.0 - 100.0 fL Final  08/06/2021 93 79 - 97 fL Final   No results found for: "PLTCOUNTKUC", "LABPLAT", "POCPLA" RDW  Date Value Ref Range Status  02/06/2022 14.4 11.5 - 15.5 % Final  08/06/2021 12.2 11.6 - 15.4 % Final         Passed - Valid encounter within  last 12 months    Recent Outpatient Visits           2 weeks ago Chronic kidney disease, stage 4 (severe)   [place] [facility] [place], Corrie Dandy T, NP   6 months ago 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)   [place] [facility] [place], Corrie Dandy T, NP   1 year ago Prostate cancer [facility])   [place] [facility] [place], Corrie Dandy T, NP   1 year ago Dementia due to Alzheimer's disease (HCC)   [place] [facility] [place], Oswaldo Conroy, PA-C   1 year ago Dementia due to Alzheimer's disease (HCC)   [place] [facility] [name], [name], MD       Future Appointments             In 2 months Sn[place], Laurette Schimke, MD [facility] [place]   In 3 months [place], Dorie Rank, NP [place] [facility]  Requested Prescriptions  Pending Prescriptions Disp Refills   colchicine 0.6 MG tablet [Pharmacy Med Name: COLCHICINE 0.6 MG TABLET] 90 tablet 0    Sig: TAKE 1 TABLET BY MOUTH EVERY DAY     Endocrinology:  Gout Agents - colchicine Failed - 09/08/2023 10:53 AM      Failed - Cr in normal range and within 360 days    Creatinine, Ser  Date Value Ref Range Status  08/06/2021 2.06 (H) 0.76 - 1.27 mg/dL Final         Failed - ALT in normal range and within 360 days    ALT  Date Value Ref Range Status  08/06/2021 29 0 - 44 IU/L Final         Failed - AST in normal range and within 360 days    AST  Date Value Ref Range Status  08/06/2021 40 0 - 40 IU/L Final         Failed - CBC within normal limits and completed in the last 12 months    WBC  Date Value Ref Range Status  02/06/2022 5.3 4.0 - 10.5 K/uL Final   RBC  Date Value Ref Range Status  02/06/2022 3.94 (L) 4.22 - 5.81 MIL/uL Final   Hemoglobin  Date Value Ref Range Status  02/06/2022 11.9 (L) 13.0 - 17.0 g/dL Final  62/95/2841 32.4 13.0 - 17.7 g/dL Final   HCT  Date Value Ref Range Status   02/06/2022 37.2 (L) 39.0 - 52.0 % Final   Hematocrit  Date Value Ref Range Status  08/06/2021 40.9 37.5 - 51.0 % Final   MCHC  Date Value Ref Range Status  02/06/2022 32.0 30.0 - 36.0 g/dL Final   [facility]  Date Value Ref Range Status  02/06/2022 30.2 26.0 - 34.0 pg Final   MCV  Date Value Ref Range Status  02/06/2022 94.4 80.0 - 100.0 fL Final  08/06/2021 93 79 - 97 fL Final   No results found for: "PLTCOUNTKUC", "LABPLAT", "POCPLA" RDW  Date Value Ref Range Status  02/06/2022 14.4 11.5 - 15.5 % Final  08/06/2021 12.2 11.6 - 15.4 % Final         Passed - Valid encounter within last 12 months    Recent Outpatient Visits           2 weeks ago Chronic kidney disease, stage 4 (severe)   [place] [facility] [place], Corrie Dandy T, NP   6 months ago 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)   [place] [facility] [place], Corrie Dandy T, NP   1 year ago Prostate cancer [facility])   [place] [facility] [place], Corrie Dandy T, NP   1 year ago Dementia due to Alzheimer's disease (HCC)   [place] [facility] [place], Oswaldo Conroy, PA-C   1 year ago Dementia due to Alzheimer's disease (HCC)   [place] [facility] [name], [name], MD       Future Appointments             In 2 months Sninsky, Laurette Schimke, MD [facility] [place]   In 3 months [place], Dorie Rank, NP [facility], [facility]

## 2023-09-08 NOTE — Telephone Encounter (Signed)
Patient  spouse states that the order for a transport chair has not been received. And would like the provider to print a copy of the order and she will pick up when she come to her appointment today. Please advise.

## 2023-09-11 ENCOUNTER — Encounter: Payer: Federal, State, Local not specified - PPO | Admitting: Psychology

## 2023-11-07 ENCOUNTER — Other Ambulatory Visit: Payer: Self-pay

## 2023-11-07 DIAGNOSIS — C61 Malignant neoplasm of prostate: Secondary | ICD-10-CM

## 2023-11-07 NOTE — Progress Notes (Signed)
 psa

## 2023-11-11 ENCOUNTER — Other Ambulatory Visit: Payer: Federal, State, Local not specified - PPO

## 2023-11-11 DIAGNOSIS — C61 Malignant neoplasm of prostate: Secondary | ICD-10-CM

## 2023-11-12 LAB — PSA: Prostate Specific Ag, Serum: <0.1 ng/mL (ref 0.0–4.0)

## 2023-11-13 ENCOUNTER — Ambulatory Visit: Payer: Federal, State, Local not specified - PPO | Admitting: Urology

## 2023-11-13 VITALS — BP 168/93 | HR 90 | Ht 72.0 in | Wt 200.0 lb

## 2023-11-13 DIAGNOSIS — C61 Malignant neoplasm of prostate: Secondary | ICD-10-CM | POA: Diagnosis not present

## 2023-11-13 NOTE — Progress Notes (Signed)
   11/13/2023 1:41 PM   Jason Estrada 07/02/1941 [id number]  Reason for visit: Follow up high risk prostate cancer, CKD  HPI: 83 year old male with a number of co-morbidities including dementia and stage IV CKD who was found to have an elevated PSA of 50 that remained elevated on recheck.  He underwent a prostate MRI that showed a 38 g prostate with a PI-RADS 5 lesion in the right anterior mid gland tracking towards the base with suspected early extracapsular disease along the right anterior lateral margin of the prostate, as well as a PI-RADS 4 lesion at the left prostate at the margin of the transition zone.  There was no lymphadenopathy or bony lesion in the pelvis.  He underwent a cognitive MRI biopsy 10/25/2021 that showed grade group 2, Gleason score 3+4=7 disease.  PSMA PET scan showed no evidence of metastatic disease. Using shared decision making, he opted for 2 years of ADT(last injection given 05/15/2023 )with external beam radiation(June 2023).    He continues to do extremely well.  PSA remains undetectable.  He denies any urinary symptoms.  We discussed the risk of recurrence and potential need for additional treatments or reinitiation of ADT in the future.  PSA lab visit 6 months, call with results   Sondra Come, MD  [facility] [address], [address] [place], [place] [address] [phone]

## 2023-11-16 IMAGING — PT NM PET TUM IMG SKULL BASE T - THIGH
1 series · 1 of 1 positions shown · non-contrast
Comparison: None.

CLINICAL DATA: Initial treatment strategy for high-risk prostate
carcinoma.

EXAM:
NUCLEAR MEDICINE PET SKULL BASE TO THIGH
TECHNIQUE: 8.8 mCi F18 Piflufolastat (Pylarify) was injected intravenously.
Full-ring PET imaging was performed from the skull base to thigh
after the radiotracer. CT data was obtained and used for attenuation
correction and anatomic localization.

[Series 1072: results mm oncology reading · 1.0mm · 0.89mm/px · 1 of 1 slices shown]
[im 1/1]
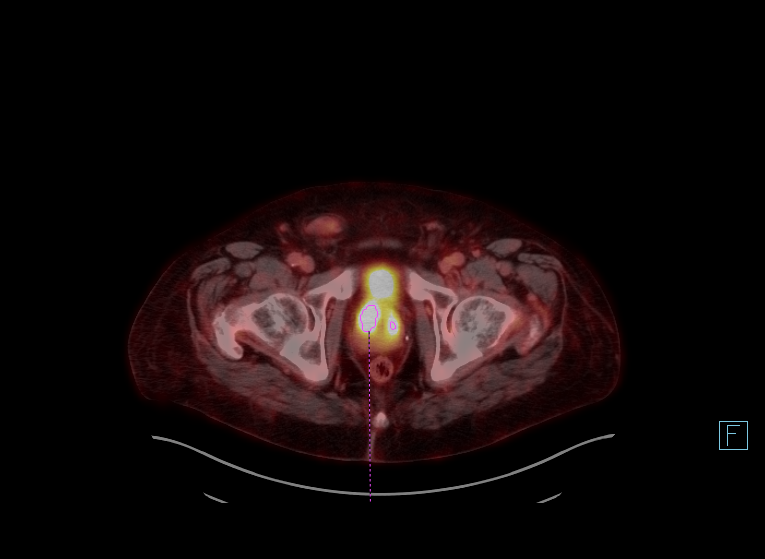

[1 of 1 positions shown; findings below may reference images not displayed]

FINDINGS: NECK

No radiotracer activity in neck lymph nodes.

Incidental CT finding: None

CHEST

No radiotracer accumulation within mediastinal or hilar lymph nodes.
No suspicious pulmonary nodules on the CT scan.

Incidental CT finding: Calcified granuloma noted in the anterior
left upper lobe. Aortic and coronary atherosclerotic calcification
noted.

ABDOMEN/PELVIS

Prostate: Mildly enlarged prostate gland shows intense radiotracer
activity in the anterior and lateral base and mid gland bilaterally,
with SUV max of 21.8.

Lymph nodes: No abnormal radiotracer accumulation within pelvic or
abdominal nodes.

Liver: No evidence of liver metastasis.

Incidental CT finding: Right hepatic lobe and left renal cysts.
Diverticulosis is seen mainly involving the descending and sigmoid
colon, however there is no evidence of diverticulitis. Aortic
atherosclerotic calcification noted.

SKELETON

No focal  activity to suggest skeletal metastasis.
IMPRESSION: Intense radiotracer activity confined to the prostate gland,
consistent with prostate carcinoma.

No evidence of lymph node metastases or distant metastatic disease.

## 2023-12-08 ENCOUNTER — Other Ambulatory Visit: Payer: Self-pay | Admitting: Physician Assistant

## 2023-12-13 NOTE — Patient Instructions (Signed)

## 2023-12-15 ENCOUNTER — Telehealth: Payer: Self-pay | Admitting: Nurse Practitioner

## 2023-12-15 NOTE — Telephone Encounter (Signed)
 Copied from CRM [id number]. Topic: General - Other >> Dec 15, 2023  3:46 PM Elle L wrote: Reason for CRM: Myrene Galas, [phone], with [employer] [place] states the patients medical equipment claim [phone] was denied due to them needing his medical records. They need a letter of medical necessity and length of time needed as well as clinical notes. Their fax number is [phone].

## 2023-12-16 NOTE — Telephone Encounter (Signed)
 Reached out to [employer], DME vendor through [facility] for wheelchair order and pending response will proceed.

## 2023-12-22 ENCOUNTER — Encounter: Payer: Self-pay | Admitting: Nurse Practitioner

## 2023-12-22 ENCOUNTER — Ambulatory Visit: Payer: Self-pay | Admitting: Nurse Practitioner

## 2023-12-22 VITALS — BP 136/77 | HR 94 | Temp 97.9°F | Ht 72.0 in | Wt 203.2 lb

## 2023-12-22 DIAGNOSIS — E210 Primary hyperparathyroidism: Secondary | ICD-10-CM

## 2023-12-22 DIAGNOSIS — K219 Gastro-esophageal reflux disease without esophagitis: Secondary | ICD-10-CM

## 2023-12-22 DIAGNOSIS — G309 Alzheimer's disease, unspecified: Secondary | ICD-10-CM

## 2023-12-22 DIAGNOSIS — N184 Chronic kidney disease, stage 4 (severe): Secondary | ICD-10-CM

## 2023-12-22 DIAGNOSIS — E782 Mixed hyperlipidemia: Secondary | ICD-10-CM

## 2023-12-22 DIAGNOSIS — C61 Malignant neoplasm of prostate: Secondary | ICD-10-CM | POA: Diagnosis not present

## 2023-12-22 DIAGNOSIS — I10 Essential (primary) hypertension: Secondary | ICD-10-CM

## 2023-12-22 DIAGNOSIS — E538 Deficiency of other specified B group vitamins: Secondary | ICD-10-CM

## 2023-12-22 DIAGNOSIS — F0280 Dementia in other diseases classified elsewhere without behavioral disturbance: Secondary | ICD-10-CM

## 2023-12-22 DIAGNOSIS — M1A0710 Idiopathic chronic gout, right ankle and foot, without tophus (tophi): Secondary | ICD-10-CM

## 2023-12-22 NOTE — Assessment & Plan Note (Signed)
 Chronic, ongoing.  Continue current medication regimen and adjust as needed. Lipid panel today.

## 2023-12-22 NOTE — Assessment & Plan Note (Signed)
 Chronic, ongoing.  Followed by nephrology, recent notes and labs reviewed.  Continue current medication regimen.

## 2023-12-22 NOTE — Assessment & Plan Note (Signed)
 Chronic, ongoing with BP at goal for age today.  Recommend he monitor BP at least a few mornings a week at home and document.  DASH diet at home.  Continue current medication regimen and adjust as needed.  Labs today: up to date with nephrology.

## 2023-12-22 NOTE — Assessment & Plan Note (Signed)
 Chronic, ongoing.  Continues on Protonix daily. Risks of PPI use were discussed with patient including bone loss, C. Diff diarrhea, pneumonia, infections, CKD, electrolyte abnormalities. Verbalizes understanding and chooses to continue the medication. Check Mag level with nephrology on next labs to minimize lab draws, wrote to Dr. Cherylann Ratel for assist on this.

## 2023-12-22 NOTE — Assessment & Plan Note (Signed)
Chronic, ongoing.  Followed by urology and continues treatment -- recent notes reviewed.

## 2023-12-22 NOTE — Progress Notes (Signed)
 BP 136/77   Pulse 94   Temp 97.9 F (36.6 C) (Oral)   Ht 6' (1.829 m)   Wt 203 lb 3.2 oz (92.2 kg)   SpO2 96%   BMI 27.56 kg/m    Subjective:    Patient ID: Jason Estrada, male    DOB: 16-May-1941, 83 y.o.   MRN: [id number]  HPI: Jason Estrada is a 83 y.o. male  Chief Complaint  Patient presents with   Hyperlipidemia   Hypertension   Memory Loss   Wife present with him at bedside.  HYPERTENSION / HYPERLIPIDEMIA + DEMENTIA Takes Pravastatin, Hydralazine, Verapamil. Continues Allopurinol and Colchicine for gout, with no recent flares.  Last neurology visit was 08/28/23. Taking Memantine and Aricept. Last fall 2 months ago, no injuries. Is able to do many things on own at home: dressing (wife helps with pants), showering (wife helps with this), and toileting. Does not drive. Continues to eat meals well at home, but is binging often in between. Sleeps, but if he wakes up he will roam. Is taking B12 daily as low levels on labs. Satisfied with current treatment? yes Duration of hypertension: chronic BP monitoring frequency: occasional BP range: <130/80 range BP medication side effects: no Duration of hyperlipidemia: chronic Cholesterol medication side effects: no Cholesterol supplements: none Medication compliance: good compliance Aspirin: no Recent stressors: no Recurrent headaches: no Visual changes: no Palpitations: no Dyspnea: no Chest pain: no Lower extremity edema: no Dizzy/lightheaded: no   CHRONIC KIDNEY DISEASE Saw nephrology last on 10/29/23: CRT 1.91, eGFR 35, BUN 24, HGB 11.8.  Follows with  urology for prostate cancer with last visit 05/15/23, radiation completed 02/15/22. Is to return every 6 months.  Continues to take Protonix for GERD with benefit. CKD status: stable Medications renally dose: yes Previous renal evaluation: yes Pneumovax:  Up to Date Influenza Vaccine:  Up to Date   Relevant past medical, surgical, family and social history reviewed and  updated as indicated. Interim medical history since our last visit reviewed. Allergies and medications reviewed and updated.  Review of Systems  Constitutional:  Negative for activity change, diaphoresis, fatigue and fever.  Respiratory:  Negative for cough, chest tightness, shortness of breath and wheezing.   Cardiovascular:  Negative for chest pain, palpitations and leg swelling.  Gastrointestinal: Negative.   Musculoskeletal:  Positive for arthralgias (baseline).  Psychiatric/Behavioral: Negative.     Per HPI unless specifically indicated above     Objective:    BP 136/77   Pulse 94   Temp 97.9 F (36.6 C) (Oral)   Ht 6' (1.829 m)   Wt 203 lb 3.2 oz (92.2 kg)   SpO2 96%   BMI 27.56 kg/m   Wt Readings from Last 3 Encounters:  12/22/23 203 lb 3.2 oz (92.2 kg)  11/13/23 200 lb (90.7 kg)  08/21/23 201 lb 3.2 oz (91.3 kg)    Physical Exam Vitals and nursing note reviewed.  Constitutional:      General: He is awake. He is not in acute distress.    Appearance: He is well-developed and well-groomed. He is not ill-appearing or toxic-appearing.  HENT:     Head: Normocephalic and atraumatic.     Right Ear: Hearing and external ear normal. No drainage.     Left Ear: Hearing and external ear normal. No drainage.  Eyes:     General: Lids are normal.        Right eye: No discharge.        Left eye: No  discharge.     Conjunctiva/sclera: Conjunctivae normal.     Pupils: Pupils are equal, round, and reactive to light.  Neck:     Thyroid: No thyromegaly.     Vascular: No carotid bruit or JVD.     Trachea: Trachea normal.  Cardiovascular:     Rate and Rhythm: Normal rate and regular rhythm.     Heart sounds: Normal heart sounds, S1 normal and S2 normal. No murmur heard.    No gallop.     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  Negative Homans. Pulmonary:     Effort: Pulmonary effort is normal. No accessory muscle usage or respiratory distress.      Breath sounds: Normal breath sounds.  Abdominal:     General: Bowel sounds are normal.     Palpations: Abdomen is soft. There is no hepatomegaly or splenomegaly.  Musculoskeletal:     Cervical back: Normal range of motion and neck supple.     Right lower leg: 1+ Edema present.     Left lower leg: No edema.     Comments: Antalgic gait present -- not using cane or walker.  Skin:    General: Skin is warm and dry.     Capillary Refill: Capillary refill takes less than 2 seconds.     Findings: No rash.  Neurological:     Mental Status: He is alert and oriented to person, place, and time.     Cranial Nerves: Cranial nerves 2-12 are intact.     Gait: Gait abnormal.     Deep Tendon Reflexes:     Reflex Scores:      Brachioradialis reflexes are 2+ on the right side and 2+ on the left side.      Patellar reflexes are 2+ on the right side and 2+ on the left side.    Comments: Takes time on recall, but overall oriented today.  Psychiatric:        Attention and Perception: Attention normal.        Mood and Affect: Mood normal.        Speech: Speech normal.        Behavior: Behavior normal. Behavior is cooperative.        Thought Content: Thought content normal.    Results for orders placed or performed in visit on 11/11/23  PSA   Collection Time: 11/11/23  1:09 PM  Result Value Ref Range   Prostate Specific Ag, Serum <0.1 0.0 - 4.0 ng/mL      Assessment & Plan:   Problem List Items Addressed This Visit       Cardiovascular and Mediastinum   Essential hypertension   Chronic, ongoing with BP at goal for age today.  Recommend he monitor BP at least a few mornings a week at home and document.  DASH diet at home.  Continue current medication regimen and adjust as needed.  Labs today: up to date with nephrology.       Relevant Orders   TSH     Digestive   GERD without esophagitis   Chronic, ongoing.  Continues on Protonix daily. Risks of PPI use were discussed with patient  including bone loss, C. Diff diarrhea, pneumonia, infections, CKD, electrolyte abnormalities. Verbalizes understanding and chooses to continue the medication. Check Mag level with nephrology on next labs to minimize lab draws, wrote to Dr. Cherylann Ratel for assist on this.       Relevant Orders   Magnesium  Endocrine   Primary hyperparathyroidism   Chronic, ongoing.  Followed by nephrology, continue this collaboration.  Recent notes and labs reviewed.        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  On memory care medications per neurology.  Continue these and collaboration with neurology, recent note reviewed.  Discussed with his wife if needed in future provider can place a referral to SW to assist with any needs.        Genitourinary   Chronic kidney disease, stage 4 (severe)   Chronic, ongoing.  Followed by nephrology, recent notes and labs reviewed.  Continue current medication regimen.      Prostate cancer   Chronic, ongoing.  Followed by urology and continues treatment -- recent notes reviewed.        Other   Gout   Chronic, stable with no recent flares.  Continue Allopurinol as ordered and further renal dose if needed.  Check uric acid level with nephrology on next labs to minimize lab draws, wrote to Dr. Cherylann Ratel for assist on this.      Relevant Orders   Uric acid   Hyperlipidemia   Chronic, ongoing.  Continue current medication regimen and adjust as needed.  Lipid panel today.         Vitamin B12 deficiency   Chronic, ongoing.  <300 last labs.  Is not taking supplement consistently.  Discussed with wife to add on Vitamin B12 1000 MCG daily for overall nervous system health.  Check B12 level with nephrology on next labs to minimize lab draws, wrote to Dr. Cherylann Ratel for assist on this.      Relevant Orders   Vitamin B12     Follow up plan: Return in about 6 months (around 06/22/2024) for HTN/HLD, DEMENTIA,  CKD.

## 2023-12-22 NOTE — Assessment & Plan Note (Signed)
Chronic, ongoing.  Followed by nephrology, continue this collaboration.  Recent notes and labs reviewed. 

## 2023-12-22 NOTE — Assessment & Plan Note (Signed)
 Chronic, progressive.  On memory care medications per neurology.  Continue these and collaboration with neurology, recent note reviewed.  Discussed with his wife if needed in future provider can place a referral to SW to assist with any needs.

## 2023-12-22 NOTE — Addendum Note (Signed)
 Addended by: Aura Dials T on: 12/22/2023 01:14 PM   Modules accepted: Level of Service

## 2023-12-22 NOTE — Assessment & Plan Note (Signed)
 Chronic, stable with no recent flares.  Continue Allopurinol as ordered and further renal dose if needed.  Check uric acid level with nephrology on next labs to minimize lab draws, wrote to Dr. Cherylann Ratel for assist on this.

## 2023-12-22 NOTE — Assessment & Plan Note (Signed)
 Chronic, ongoing.  <300 last labs.  Is not taking supplement consistently.  Discussed with wife to add on Vitamin B12 1000 MCG daily for overall nervous system health.  Check B12 level with nephrology on next labs to minimize lab draws, wrote to Dr. Cherylann Ratel for assist on this.

## 2024-02-27 ENCOUNTER — Ambulatory Visit: Payer: Federal, State, Local not specified - PPO | Admitting: Physician Assistant

## 2024-02-27 ENCOUNTER — Encounter: Payer: Self-pay | Admitting: Physician Assistant

## 2024-02-27 VITALS — BP 134/69 | HR 88 | Resp 20 | Ht 72.0 in | Wt 191.0 lb

## 2024-02-27 DIAGNOSIS — G309 Alzheimer's disease, unspecified: Secondary | ICD-10-CM | POA: Diagnosis not present

## 2024-02-27 DIAGNOSIS — F0280 Dementia in other diseases classified elsewhere without behavioral disturbance: Secondary | ICD-10-CM | POA: Diagnosis not present

## 2024-02-27 MED ORDER — TRAZODONE HCL 50 MG PO TABS
ORAL_TABLET | ORAL | 11 refills | Status: DC
Start: 2024-02-27 — End: 2024-03-23

## 2024-02-27 NOTE — Patient Instructions (Addendum)
 It was a pleasure to see you today at our office.   Recommendations:  Follow up in  6 months Continue  Donepezil   23 mg mg daily   Memantine  10mg  tablets.  Take 1 tablet  twice daily.  Start trazodone 25 mg  nightly         Whom to call:  Memory  decline, memory medications: Call our office [phone]   For psychiatric meds, mood meds: Please have your primary care physician manage these medications.   Counseling regarding caregiver distress, including caregiver depression, anxiety and issues regarding community resources, adult day care programs, adult living facilities, or memory care questions:   Feel free to contact Misty Court Distance, Social Worker at [phone]   For assessment of decision of mental capacity and competency:  Call Dr. Laverne Potter, geriatric psychiatrist at [phone]  For guidance in geriatric dementia issues please call [facility] [phone]  For guidance regarding [facility] and if placement were needed at the facility, contact Forrestine Ike, Social Worker tel: [phone]  If you have any severe symptoms of a stroke, or other severe issues such as confusion,severe chills or fever, etc call 911 or go to the ER as you may need to be evaluated further     RECOMMENDATIONS FOR ALL PATIENTS WITH MEMORY PROBLEMS: 1. Continue to exercise (Recommend 30 minutes of walking everyday, or 3 hours every week) 2. Increase social interactions - continue going to [place] and enjoy social gatherings with friends and family 3. Eat healthy, avoid fried foods and eat more fruits and vegetables 4. Maintain adequate blood pressure, blood sugar, and blood cholesterol level. Reducing the risk of stroke and cardiovascular disease also helps promoting better memory. 5. Avoid stressful situations. Live a simple life and avoid aggravations. Organize your time and prepare for the next day in anticipation. 6. Sleep well, avoid any  interruptions of sleep and avoid any distractions in the bedroom that may interfere with adequate sleep quality 7. Avoid sugar, avoid sweets as there is a strong link between excessive sugar intake, diabetes, and cognitive impairment We discussed the Mediterranean diet, which has been shown to help patients reduce the risk of progressive memory disorders and reduces cardiovascular risk. This includes eating fish, eat fruits and green leafy vegetables, nuts like almonds and hazelnuts, walnuts, and also use olive oil. Avoid fast foods and fried foods as much as possible. Avoid sweets and sugar as sugar use has been linked to worsening of memory function.  There is always a concern of gradual progression of memory problems. If this is the case, then we may need to adjust level of care according to patient needs. Support, both to the patient and caregiver, should then be put into place.    The Alzheimer's Association is here all day, every day for people facing Alzheimer's disease through our free 24/7 Helpline: [phone]) [phone]. The Helpline provides reliable information and support to all those who need assistance, such as individuals living with memory loss, Alzheimer's or other dementia, caregivers, health care professionals and the public.  Our highly trained and knowledgeable staff can help you with: Understanding memory loss, dementia and Alzheimer's  Medications and other treatment options  General information about aging and brain health  Skills to provide quality care and to find the best care from professionals  Legal, financial and living-arrangement decisions Our Helpline also features: Confidential care consultation provided by master's level clinicians who can help with decision-making support, crisis assistance and education on  issues families face every day  Help in a caller's preferred language using our translation service that features more than 200 languages and dialects  Referrals to  local community programs, services and ongoing support     FALL PRECAUTIONS: Be cautious when walking. Scan the area for obstacles that may increase the risk of trips and falls. When getting up in the mornings, sit up at the edge of the bed for a few minutes before getting out of bed. Consider elevating the bed at the head end to avoid drop of blood pressure when getting up. Walk always in a well-lit room (use night lights in the walls). Avoid area rugs or power cords from appliances in the middle of the walkways. Use a walker or a cane if necessary and consider physical therapy for balance exercise. Get your eyesight checked regularly.  FINANCIAL OVERSIGHT: Supervision, especially oversight when making financial decisions or transactions is also recommended.  HOME SAFETY: Consider the safety of the kitchen when operating appliances like stoves, microwave oven, and blender. Consider having supervision and share cooking responsibilities until no longer able to participate in those. Accidents with firearms and other hazards in the house should be identified and addressed as well.   ABILITY TO BE LEFT ALONE: If patient is unable to contact 911 operator, consider using LifeLine, or when the need is there, arrange for someone to stay with patients. Smoking is a fire hazard, consider supervision or cessation. Risk of wandering should be assessed by caregiver and if detected at any point, supervision and safe proof recommendations should be instituted.  MEDICATION SUPERVISION: Inability to self-administer medication needs to be constantly addressed. Implement a mechanism to ensure safe administration of the medications.       Mediterranean Diet A Mediterranean diet refers to food and lifestyle choices that are based on the traditions of countries located on the [employer]. This way of eating has been shown to help prevent certain conditions and improve outcomes for people who have chronic  diseases, like kidney disease and heart disease. What are tips for following this plan? Lifestyle  Cook and eat meals together with your family, when possible. Drink enough fluid to keep your urine clear or pale yellow. Be physically active every day. This includes: Aerobic exercise like running or swimming. Leisure activities like gardening, walking, or housework. Get 7-8 hours of sleep each night. If recommended by your health care provider, drink red wine in moderation. This means 1 glass a day for nonpregnant women and 2 glasses a day for men. A glass of wine equals 5 oz (150 mL). Reading food labels  Check the serving size of packaged foods. For foods such as rice and pasta, the serving size refers to the amount of cooked product, not dry. Check the total fat in packaged foods. Avoid foods that have saturated fat or trans fats. Check the ingredients list for added sugars, such as corn syrup. Shopping  At the grocery store, buy most of your food from the areas near the walls of the store. This includes: Fresh fruits and vegetables (produce). Grains, beans, nuts, and seeds. Some of these may be available in unpackaged forms or large amounts (in bulk). Fresh seafood. Poultry and eggs. Low-fat dairy products. Buy whole ingredients instead of prepackaged foods. Buy fresh fruits and vegetables in-season from local farmers markets. Buy frozen fruits and vegetables in resealable bags. If you do not have access to quality fresh seafood, buy precooked frozen shrimp or canned fish, such as  tuna, salmon, or sardines. Buy small amounts of raw or cooked vegetables, salads, or olives from the deli or salad bar at your store. Stock your pantry so you always have certain foods on hand, such as olive oil, canned tuna, canned tomatoes, rice, pasta, and beans. Cooking  Cook foods with extra-virgin olive oil instead of using butter or other vegetable oils. Have meat as a side dish, and have vegetables  or grains as your main dish. This means having meat in small portions or adding small amounts of meat to foods like pasta or stew. Use beans or vegetables instead of meat in common dishes like chili or lasagna. Experiment with different cooking methods. Try roasting or broiling vegetables instead of steaming or sauteing them. Add frozen vegetables to soups, stews, pasta, or rice. Add nuts or seeds for added healthy fat at each meal. You can add these to yogurt, salads, or vegetable dishes. Marinate fish or vegetables using olive oil, lemon juice, garlic, and fresh herbs. Meal planning  Plan to eat 1 vegetarian meal one day each week. Try to work up to 2 vegetarian meals, if possible. Eat seafood 2 or more times a week. Have healthy snacks readily available, such as: Vegetable sticks with hummus. Greek yogurt. Fruit and nut trail mix. Eat balanced meals throughout the week. This includes: Fruit: 2-3 servings a day Vegetables: 4-5 servings a day Low-fat dairy: 2 servings a day Fish, poultry, or lean meat: 1 serving a day Beans and legumes: 2 or more servings a week Nuts and seeds: 1-2 servings a day Whole grains: 6-8 servings a day Extra-virgin olive oil: 3-4 servings a day Limit red meat and sweets to only a few servings a month What are my food choices? Mediterranean diet Recommended Grains: Whole-grain pasta. Brown rice. Bulgar wheat. Polenta. Couscous. Whole-wheat bread. Dwyane Glad. Vegetables: Artichokes. Beets. Broccoli. Cabbage. Carrots. Eggplant. Green beans. Chard. Kale. Spinach. Onions. Leeks. Peas. Squash. Tomatoes. Peppers. Radishes. Fruits: Apples. Apricots. Avocado. Berries. Bananas. Cherries. Dates. Figs. Grapes. Lemons. Melon. Oranges. Peaches. Plums. Pomegranate. Meats and other protein foods: Beans. Almonds. Sunflower seeds. Pine nuts. Peanuts. Cod. Salmon. Scallops. Shrimp. Tuna. Tilapia. Clams. Oysters. Eggs. Dairy: Low-fat milk. Cheese. Greek yogurt. Beverages:  Water. Red wine. Herbal tea. Fats and oils: Extra virgin olive oil. Avocado oil. Grape seed oil. Sweets and desserts: [place] yogurt with honey. Baked apples. Poached pears. Trail mix. Seasoning and other foods: Basil. Cilantro. Coriander. Cumin. Mint. Parsley. Sage. Rosemary. Tarragon. Garlic. Oregano. Thyme. Pepper. Balsalmic vinegar. Tahini. Hummus. Tomato sauce. Olives. Mushrooms. Limit these Grains: Prepackaged pasta or rice dishes. Prepackaged cereal with added sugar. Vegetables: Deep fried potatoes (french fries). Fruits: Fruit canned in syrup. Meats and other protein foods: Beef. Pork. Lamb. Poultry with skin. Hot dogs. Helene Loader. Dairy: Ice cream. Sour cream. Whole milk. Beverages: Juice. Sugar-sweetened soft drinks. Beer. Liquor and spirits. Fats and oils: Butter. Canola oil. Vegetable oil. Beef fat (tallow). Lard. Sweets and desserts: Cookies. Cakes. Pies. Candy. Seasoning and other foods: Mayonnaise. Premade sauces and marinades. The items listed may not be a complete list. Talk with your dietitian about what dietary choices are right for you. Summary The Mediterranean diet includes both food and lifestyle choices. Eat a variety of fresh fruits and vegetables, beans, nuts, seeds, and whole grains. Limit the amount of red meat and sweets that you eat. Talk with your health care provider about whether it is safe for you to drink red wine in moderation. This means 1 glass a day for nonpregnant women  and 2 glasses a day for men. A glass of wine equals 5 oz (150 mL). This information is not intended to replace advice given to you by your health care provider. Make sure you discuss any questions you have with your health care provider. Document Released: 04/25/2016 Document Revised: 05/28/2016 Document Reviewed: 04/25/2016 Elsevier Interactive Patient Education  2017 [employer].

## 2024-02-27 NOTE — Progress Notes (Signed)
 Assessment/Plan:   Dementia likely due to Alzheimer disease  Jason Estrada is a very pleasant 83 y.o. RH male with a history of prostate cancer, CKD, anxiety, of dementia likely due to Alzheimer's disease seen today in follow up for memory loss. Patient is currently on memantine  10 mg twice daily and donepezil  23mg  daily.  He continues to have cognitive decline.  He needs more assistance with ADLs. He attends [facility].  Mood is good. HE does not sleep well discussed starting trazodone, wife agrees.    Follow up in  6 months. Continue donepezil  23 mg daily and memantine  10 mg twice daily. Continue replenishing B12 Start trazodone 25 mg at night  Recommend resuming [facility] Resume PT  Recommend good control of her cardiovascular risk factors Continue to control mood as per PCP     Subjective:    This patient is accompanied in the office by his wife who supplements the history.  Previous records as well as any outside records available were reviewed prior to todays visit. Patient was last seen on 08/28/2023    Any changes in memory since last visit?   Memory is worse .  He does not recognize his wife at times.  He tries to communicate but does not remember words. He picks the skin, ears, laughs at any comment, fidgets, taking of  and putting on his shoes  repeats oneself?  He is less conversant than before  Disoriented when walking into a room?  He may confuse the rooms at times  Leaving objects?  May misplace things but not in unusual places. He took all of the cleaning stuff of the bathroom, he even took the shelf, he was trying to reach the sink, because he was trying to pee in the vanity.  He then decided to urinate in the shower.   Wandering behavior?  denies.  He is always with his wife when at home. Any personality changes since last visit?  Some tantrums  Any worsening depression?:  Denies.   Hallucinations or paranoia?  Denies.   Seizures? denies    Any sleep changes?  He  does not sleep very well because he confuses days and nights. He roams around, getting dressed and undressed. Denies vivid dreams, REM behavior or sleepwalking   Sleep apnea?   Denies.   Any hygiene concerns?  He does not want to shower -wife says Independent of bathing and dressing?  He needs help to get dressed Does the patient needs help with medications?  Wife is in charge  Who is in charge of the finances?  Wife is in charge    Any changes in appetite?  Eats well but even dog food, he binges and can eat 1 lb of cheese, a whole bag of pepperoni, drink all bottle of cranberry juice    Patient have trouble swallowing? Denies.   Does the patient cook? No Any headaches?   denies   Any vision changes?  Denies  Chronic back pain  denies   Ambulates with difficulty?  He is lower than before, and flexes forward.  Uses a cane for stability.    Recent falls or head injuries? Had a soft fall May 23, he went to the front door, fallen in the street at [employer] area, wife feel that he was exhausted.  No head injury. Since then she has security locks . Unilateral weakness, numbness or tingling? denies   Any tremors?  Denies   Any anosmia?  Denies   Any  incontinence of urine?  Endorsed, wears diapers   Any bowel dysfunction?  Endorse, he is incontinent now since 01/2022   Patient lives with wife  Does the patient drive? No longer drives     Neuropsych evaluation 03/2023 Briefly, results suggested severe impairment surrounding all aspects of learning and memory. Additional impairments were exhibited across processing speed, several aspects of executive functioning (i.e., cognitive flexibility, response inhibition, and safety/judgment), and semantic fluency. Performance variability was exhibited across confrontation naming. Relative to his previous evaluation in January 2023, cognitive decline was exhibited across several domains including processing speed, executive functioning, semantic fluency,  confrontation naming, and recognition/consolidation aspects of memory. Broadly speaking, the rate of decline appeared relatively mild. Stability was exhibited across other assessed domains. Regarding etiology, I continue to have primary concerns surrounding underlying Alzheimer's disease (i.e., dementia due to Alzheimer's disease). Across memory testing, Mr. Osment did not benefit from repeated exposure to information across learning trials, was essentially amnestic across memory tasks after a short delay, and performed poorly across yes/no recognition tasks. This suggests the presence of rapid forgetting and a significant memory storage deficit, both of which are hallmark characteristics of Alzheimer's disease. Evidence for progressive cognitive decline over the past 18 months also elevates concerns for the presence of a neurodegenerative illness, as does this decline largely occurring in domains commonly impacted by this illness (e.g., memory, semantic fluency, confrontation naming, executive functioning). His wife's reporting of him rapidly forgetting information and repeating himself continues to align with this illness as well. Overall, this would appear the most likely culprit for ongoing cognitive impairment and progressive decline.   Initial Evaluation 06/04/21 The patient is seen in neurologic consultation at the request of [name], [name], MD for the evaluation of memory.  The patient is accompanied by his wife who supplements the history. 49 y.o. year old male who has had memory issues for about 1 year when he noticed worsening short term memory deficiencies. He pauses till he can find the word -wife says.  He misses appointments frequently. He moved from [place]  1 year ago, and his wife would travel from there to [place] during this time, able to see these changes. This was worse until 4 weeks ago, somehow it is a little bit better but not much. He does repeat the same stories and questions. He  denies depression or irritability. His mood is good. He  likes to play solitaire on his phone, trivia, reading and walking. He sleeps better than when first moving, He has some vivid dreams without  sleepwalking. At times he confuses the days and nights. He wanted to eat breakfast at midnight.  In July he was confused, found to have elevated PSA and was referred to a urologist, workup ongoing. He has some urine hesitancy because of prostatic issues. Currently denies hallucinations or paranoia. Denies leaving objects in unusual places.   Wife reports issues with hygiene.Bathing since around July, is a battle, and he likes to use the same underwear. Wife administers the medications and manages the bills, because he fell victim to scams. His appetite is good and denies trouble swallowing, He cooks and denies leaving the stove on. Wife supervises . He ambulates without devices, but is prone to falls due to gout. Denies head injuries.    He drives with GPS and wife is concerned because he relies on it and still gets lost, took me to the wrong place the other day. He has issues with time gap as well. He believes that he  has been in [place] longer, no sense of when and where he is.  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 Recently has  worked for [employer]. Denies headaches, double vision, dizziness, focal numbness or tingling, unilateral weakness or tremors.  No constipation or diarrhea. Denies anosmia. Denies history of OSA, ETOH or Tobacco. Family History negative for dementia. PREVIOUS MEDICATIONS:   CURRENT MEDICATIONS:  Outpatient Encounter Medications as of 02/27/2024  Medication Sig   allopurinol  (ZYLOPRIM ) 100 MG tablet Take 1 tablet (100 mg total) by mouth daily.   baclofen  (LIORESAL ) 10 MG tablet TAKE 1 TABLET BY MOUTH EVERY DAY AS NEEDED   colchicine  0.6 MG tablet TAKE 1 TABLET BY MOUTH EVERY DAY   cyanocobalamin (VITAMIN B12) 1000 MCG tablet Take 1,000  mcg by mouth daily.   donepezil  (ARICEPT ) 23 MG TABS tablet Take 1 tablet (23 mg total) by mouth daily.   folic acid  (FOLVITE ) 1 MG tablet TAKE 1 TABLET BY MOUTH EVERY DAY   hydrALAZINE  (APRESOLINE ) 50 MG tablet Take 0.5 tablets (25 mg total) by mouth in the morning and at bedtime.   memantine  (NAMENDA ) 10 MG tablet Take 1 tab twice a day   pantoprazole  (PROTONIX ) 20 MG tablet Take 1 tablet (20 mg total) by mouth daily.   pravastatin  (PRAVACHOL ) 40 MG tablet Take 1 tablet (40 mg total) by mouth daily.   traZODone (DESYREL) 50 MG tablet Take half tablet at night, may increase to one full tablet if needed   verapamil  (CALAN -SR) 240 MG CR tablet Take 1 tablet (240 mg total) by mouth daily.   No facility-administered encounter medications on file as of 02/27/2024.       11/26/2022    4:00 PM 05/14/2022    7:00 AM  MMSE - Mini Mental State Exam  Orientation to time 4 0  Orientation to Place 3 2  Registration 3 3  Attention/ Calculation 4 4  Recall 2 0  Language- name 2 objects 2 2  Language- repeat 1 1  Language- follow 3 step command 3 2  Language- read & follow direction 1 1  Write a sentence 1 0  Copy design 1 1  Total score 25 16      06/01/2021    9:00 AM  Montreal Cognitive Assessment   Visuospatial/ Executive (0/5) 4  Naming (0/3) 3  Attention: Read list of digits (0/2) 2  Attention: Read list of letters (0/1) 0  Attention: Serial 7 subtraction starting at 100 (0/3) 1  Language: Repeat phrase (0/2) 2  Language : Fluency (0/1) 0  Abstraction (0/2) 0  Delayed Recall (0/5) 0  Orientation (0/6) 4  Total 16  Adjusted Score (based on education) 17    Objective:     PHYSICAL EXAMINATION:    VITALS:   Vitals:   02/27/24 1255  BP: 134/69  Pulse: 88  Resp: 20  SpO2: 96%  Weight: 191 lb (86.6 kg)  Height: 6' (1.829 m)    GEN:  The patient appears stated age and is in NAD. HEENT:  Normocephalic, atraumatic.   Neurological examination:  General: NAD,  well-groomed, appears stated age. Orientation: The patient is alert.  Not oriented to person, place and date Cranial nerves: There is good facial symmetry.The speech is not fluent and clear. No aphasia or dysarthria. Fund of knowledge is appropriate. Recent and remote memory are impaired. Attention and concentration are reduced. Able to name objects and repeat phrases.  Hearing is intact to conversational  tone.   Sensation: Sensation is intact to light touch throughout Motor: Strength is at least antigravity x4. DTR's 2/4 in UE/LE     Movement examination: Tone: There is normal tone in the UE/LE Abnormal movements:  no tremor.  No myoclonus.  No asterixis.   Coordination:  There is  decremation with RAM's. Abnormal finger to nose  Gait and Station: The patient has no difficulty arising out of a deep-seated chair without the use of the hands.uses a cane for stability.  Flexes forward.  The patient's stride length is shorter. Gait is cautious and narrow.    Thank you for allowing us  the opportunity to participate in the care of this nice patient. Please do not hesitate to contact us  for any questions or concerns.   Total time spent on today's visit was 30 minutes dedicated to this patient today, preparing to see patient, examining the patient, ordering tests and/or medications and counseling the patient, documenting clinical information in the EHR or other health record, independently interpreting results and communicating results to the patient/family, discussing treatment and goals, answering patient's questions and coordinating care.  Cc:  Lemar Pyles, NP  Tex Filbert 02/29/2024 4:49 PM

## 2024-03-12 ENCOUNTER — Other Ambulatory Visit: Payer: Self-pay | Admitting: Nurse Practitioner

## 2024-03-12 NOTE — Telephone Encounter (Signed)
 Requested medication (s) are due for refill today:   Yes  Requested medication (s) are on the active medication list:   Yes  Future visit scheduled:   Yes 10/7   Last ordered: 06/12/2023 #90, 2 refills  Unable to refill because labs are due per protocol   Requested Prescriptions  Pending Prescriptions Disp Refills   baclofen  (LIORESAL ) 10 MG tablet [Pharmacy Med Name: BACLOFEN  10 MG TABLET] 90 tablet 2    Sig: TAKE 1 TABLET BY MOUTH EVERY DAY AS NEEDED     Analgesics:  Muscle Relaxants - baclofen  Failed - 03/12/2024  3:27 PM      Failed - Cr in normal range and within 180 days    Creatinine, Ser  Date Value Ref Range Status  08/06/2021 2.06 (H) 0.76 - 1.27 mg/dL Final         Failed - eGFR is 30 or above and within 180 days    GFR, Estimated  Date Value Ref Range Status  04/23/2021 24 (L) >60 mL/min Final    Comment:    (NOTE) Calculated using the CKD-EPI Creatinine Equation (2021)    eGFR  Date Value Ref Range Status  08/06/2021 32 (L) >59 mL/min/1.73 Final         Passed - Valid encounter within last 6 months    Recent Outpatient Visits           2 months ago Dementia due to Alzheimer's disease   [place] [facility] [name], NP       Future Appointments             In 8 months Francisca, Redell BROCKS, MD [facility] [place]

## 2024-03-20 ENCOUNTER — Other Ambulatory Visit: Payer: Self-pay | Admitting: Physician Assistant

## 2024-03-30 ENCOUNTER — Other Ambulatory Visit: Payer: Self-pay | Admitting: Nurse Practitioner

## 2024-04-01 NOTE — Telephone Encounter (Signed)
 Requested medication (s) are due for refill today:   Yes  Requested medication (s) are on the active medication list:   Yes   Future visit scheduled:   Yes 10/7   LOV 12/22/2023    Last ordered: 09/08/2023 #90, 0 refills  Unable to refill because all labs are due.      Requested Prescriptions  Pending Prescriptions Disp Refills   colchicine  0.6 MG tablet [Pharmacy Med Name: COLCHICINE  0.6 MG TABLET] 90 tablet 0    Sig: TAKE 1 TABLET BY MOUTH EVERY DAY     Endocrinology:  Gout Agents - colchicine  Failed - 04/01/2024 11:02 AM      Failed - Cr in normal range and within 360 days    Creatinine, Ser  Date Value Ref Range Status  08/06/2021 2.06 (H) 0.76 - 1.27 mg/dL Final         Failed - ALT in normal range and within 360 days    ALT  Date Value Ref Range Status  08/06/2021 29 0 - 44 IU/L Final         Failed - AST in normal range and within 360 days    AST  Date Value Ref Range Status  08/06/2021 40 0 - 40 IU/L Final         Failed - CBC within normal limits and completed in the last 12 months    WBC  Date Value Ref Range Status  02/06/2022 5.3 4.0 - 10.5 K/uL Final   RBC  Date Value Ref Range Status  02/06/2022 3.94 (L) 4.22 - 5.81 MIL/uL Final   Hemoglobin  Date Value Ref Range Status  02/06/2022 11.9 (L) 13.0 - 17.0 g/dL Final  88/78/7977 86.7 13.0 - 17.7 g/dL Final   HCT  Date Value Ref Range Status  02/06/2022 37.2 (L) 39.0 - 52.0 % Final   Hematocrit  Date Value Ref Range Status  08/06/2021 40.9 37.5 - 51.0 % Final   MCHC  Date Value Ref Range Status  02/06/2022 32.0 30.0 - 36.0 g/dL Final   [facility]  Date Value Ref Range Status  02/06/2022 30.2 26.0 - 34.0 pg Final   MCV  Date Value Ref Range Status  02/06/2022 94.4 80.0 - 100.0 fL Final  08/06/2021 93 79 - 97 fL Final   No results found for: PLTCOUNTKUC, LABPLAT, POCPLA RDW  Date Value Ref Range Status  02/06/2022 14.4 11.5 - 15.5 % Final  08/06/2021 12.2 11.6 - 15.4 % Final          Passed - Valid encounter within last 12 months    Recent Outpatient Visits           3 months ago Dementia due to Alzheimer's disease   [place] [facility] [place], Jason DASEN, Jason Estrada       Future Appointments             In 7 months Francisca, Jason BROCKS, Jason Estrada [facility] [place]

## 2024-05-12 ENCOUNTER — Encounter: Payer: Self-pay | Admitting: Urology

## 2024-05-12 ENCOUNTER — Other Ambulatory Visit: Payer: Federal, State, Local not specified - PPO

## 2024-05-12 DIAGNOSIS — C61 Malignant neoplasm of prostate: Secondary | ICD-10-CM

## 2024-05-13 ENCOUNTER — Ambulatory Visit: Payer: Self-pay | Admitting: Urology

## 2024-05-13 LAB — PSA: Prostate Specific Ag, Serum: <0.1 ng/mL (ref 0.0–4.0)

## 2024-06-19 NOTE — Patient Instructions (Incomplete)
 Be Involved in Caring For Your Health:  Taking Medications When medications are taken as directed, they can greatly improve your health. But if they are not taken as prescribed, they may not work. In some cases, not taking them correctly can be harmful. To help ensure your treatment remains effective and safe, understand your medications and how to take them. Bring your medications to each visit for review by your provider.  Your lab results, notes, and after visit summary will be available on My Chart. We strongly encourage you to use this feature. If lab results are abnormal the clinic will contact you with the appropriate steps. If the clinic does not contact you assume the results are satisfactory. You can always view your results on My Chart. If you have questions regarding your health or results, please contact the clinic during office hours. You can also ask questions on My Chart.  We at [facility] are grateful that you chose [place] to provide your care. We strive to provide evidence-based and compassionate care and are always looking for feedback. If you get a survey from the clinic please complete this so we can hear your opinions.  Heart-Healthy Eating Plan Many factors influence your heart health, including eating and exercise habits. Heart health is also called coronary health. Coronary risk increases with abnormal blood fat (lipid) levels. A heart-healthy eating plan includes limiting unhealthy fats, increasing healthy fats, limiting salt (sodium) intake, and making other diet and lifestyle changes. What is my plan? Your health care provider may recommend that: You limit your fat intake to _________% or less of your total calories each day. You limit your saturated fat intake to _________% or less of your total calories each day. You limit the amount of cholesterol in your diet to less than _________ mg per day. You limit the amount of sodium in your diet to less than _________  mg per day. What are tips for following this plan? Cooking Cook foods using methods other than frying. Baking, boiling, grilling, and broiling are all good options. Other ways to reduce fat include: Removing the skin from poultry. Removing all visible fats from meats. Steaming vegetables in water or broth. Meal planning  At meals, imagine dividing your plate into fourths: Fill one-half of your plate with vegetables and green salads. Fill one-fourth of your plate with whole grains. Fill one-fourth of your plate with lean protein foods. Eat 2-4 cups of vegetables per day. One cup of vegetables equals 1 cup (91 g) broccoli or cauliflower florets, 2 medium carrots, 1 large bell pepper, 1 large sweet potato, 1 large tomato, 1 medium white potato, 2 cups (150 g) raw leafy greens. Eat 1-2 cups of fruit per day. One cup of fruit equals 1 small apple, 1 large banana, 1 cup (237 g) mixed fruit, 1 large orange,  cup (82 g) dried fruit, 1 cup (240 mL) 100% fruit juice. Eat more foods that contain soluble fiber. Examples include apples, broccoli, carrots, beans, peas, and barley. Aim to get 25-30 g of fiber per day. Increase your consumption of legumes, nuts, and seeds to 4-5 servings per week. One serving of dried beans or legumes equals  cup (90 g) cooked, 1 serving of nuts is  oz (12 almonds, 24 pistachios, or 7 walnut halves), and 1 serving of seeds equals  oz (8 g). Fats Choose healthy fats more often. Choose monounsaturated and polyunsaturated fats, such as olive and canola oils, avocado oil, flaxseeds, walnuts, almonds, and seeds. Eat  more omega-3 fats. Choose salmon, mackerel, sardines, tuna, flaxseed oil, and ground flaxseeds. Aim to eat fish at least 2 times each week. Check food labels carefully to identify foods with trans fats or high amounts of saturated fat. Limit saturated fats. These are found in animal products, such as meats, butter, and cream. Plant sources of saturated fats  include palm oil, palm kernel oil, and coconut oil. Avoid foods with partially hydrogenated oils in them. These contain trans fats. Examples are stick margarine, some tub margarines, cookies, crackers, and other baked goods. Avoid fried foods. General information Eat more home-cooked food and less restaurant, buffet, and fast food. Limit or avoid alcohol. Limit foods that are high in added sugar and simple starches such as foods made using white refined flour (white breads, pastries, sweets). Lose weight if you are overweight. Losing just 5-10% of your body weight can help your overall health and prevent diseases such as diabetes and heart disease. Monitor your sodium intake, especially if you have high blood pressure. Talk with your health care provider about your sodium intake. Try to incorporate more vegetarian meals weekly. What foods should I eat? Fruits All fresh, canned (in natural juice), or frozen fruits. Vegetables Fresh or frozen vegetables (raw, steamed, roasted, or grilled). Green salads. Grains Most grains. Choose whole wheat and whole grains most of the time. Rice and pasta, including brown rice and pastas made with whole wheat. Meats and other proteins Lean, well-trimmed beef, veal, pork, and lamb. Chicken and [place] without skin. All fish and shellfish. Wild duck, rabbit, pheasant, and venison. Egg whites or low-cholesterol egg substitutes. Dried beans, peas, lentils, and tofu. Seeds and most nuts. Dairy Low-fat or nonfat cheeses, including ricotta and mozzarella. Skim or 1% milk (liquid, powdered, or evaporated). Buttermilk made with low-fat milk. Nonfat or low-fat yogurt. Fats and oils Non-hydrogenated (trans-free) margarines. Vegetable oils, including soybean, sesame, sunflower, olive, avocado, peanut, safflower, corn, canola, and cottonseed. Salad dressings or mayonnaise made with a vegetable oil. Beverages Water (mineral or sparkling). Coffee and tea. Unsweetened ice  tea. Diet beverages. Sweets and desserts Sherbet, gelatin, and fruit ice. Small amounts of dark chocolate. Limit all sweets and desserts. Seasonings and condiments All seasonings and condiments. The items listed above may not be a complete list of foods and beverages you can eat. Contact a dietitian for more options. What foods should I avoid? Fruits Canned fruit in heavy syrup. Fruit in cream or butter sauce. Fried fruit. Limit coconut. Vegetables Vegetables cooked in cheese, cream, or butter sauce. Fried vegetables. Grains Breads made with saturated or trans fats, oils, or whole milk. Croissants. Sweet rolls. Donuts. High-fat crackers, such as cheese crackers and chips. Meats and other proteins Fatty meats, such as hot dogs, ribs, sausage, bacon, rib-eye roast or steak. High-fat deli meats, such as salami and bologna. Caviar. Domestic duck and goose. Organ meats, such as liver. Dairy Cream, sour cream, cream cheese, and creamed cottage cheese. Whole-milk cheeses. Whole or 2% milk (liquid, evaporated, or condensed). Whole buttermilk. Cream sauce or high-fat cheese sauce. Whole-milk yogurt. Fats and oils Meat fat, or shortening. Cocoa butter, hydrogenated oils, palm oil, coconut oil, palm kernel oil. Solid fats and shortenings, including bacon fat, salt pork, lard, and butter. Nondairy cream substitutes. Salad dressings with cheese or sour cream. Beverages Regular sodas and any drinks with added sugar. Sweets and desserts Frosting. Pudding. Cookies. Cakes. Pies. Milk chocolate or white chocolate. Buttered syrups. Full-fat ice cream or ice cream drinks. The items listed above may  not be a complete list of foods and beverages to avoid. Contact a dietitian for more information. Summary Heart-healthy meal planning includes limiting unhealthy fats, increasing healthy fats, limiting salt (sodium) intake and making other diet and lifestyle changes. Lose weight if you are overweight. Losing just  5-10% of your body weight can help your overall health and prevent diseases such as diabetes and heart disease. Focus on eating a balance of foods, including fruits and vegetables, low-fat or nonfat dairy, lean protein, nuts and legumes, whole grains, and heart-healthy oils and fats. This information is not intended to replace advice given to you by your health care provider. Make sure you discuss any questions you have with your health care provider. Document Revised: 10/08/2021 Document Reviewed: 10/08/2021 Elsevier Patient Education  2024 [employer].

## 2024-06-22 ENCOUNTER — Ambulatory Visit: Admitting: Nurse Practitioner

## 2024-06-22 ENCOUNTER — Encounter: Payer: Self-pay | Admitting: Nurse Practitioner

## 2024-06-22 VITALS — BP 123/68 | HR 69 | Temp 98.2°F | Resp 16 | Ht 72.01 in | Wt 188.0 lb

## 2024-06-22 DIAGNOSIS — G309 Alzheimer's disease, unspecified: Secondary | ICD-10-CM | POA: Diagnosis not present

## 2024-06-22 DIAGNOSIS — K219 Gastro-esophageal reflux disease without esophagitis: Secondary | ICD-10-CM

## 2024-06-22 DIAGNOSIS — I10 Essential (primary) hypertension: Secondary | ICD-10-CM

## 2024-06-22 DIAGNOSIS — M1A3720 Chronic gout due to renal impairment, left ankle and foot, without tophus (tophi): Secondary | ICD-10-CM

## 2024-06-22 DIAGNOSIS — N184 Chronic kidney disease, stage 4 (severe): Secondary | ICD-10-CM

## 2024-06-22 DIAGNOSIS — F0280 Dementia in other diseases classified elsewhere without behavioral disturbance: Secondary | ICD-10-CM

## 2024-06-22 DIAGNOSIS — E782 Mixed hyperlipidemia: Secondary | ICD-10-CM

## 2024-06-22 DIAGNOSIS — C61 Malignant neoplasm of prostate: Secondary | ICD-10-CM | POA: Diagnosis not present

## 2024-06-22 DIAGNOSIS — E210 Primary hyperparathyroidism: Secondary | ICD-10-CM

## 2024-06-22 DIAGNOSIS — Z23 Encounter for immunization: Secondary | ICD-10-CM

## 2024-06-22 DIAGNOSIS — E538 Deficiency of other specified B group vitamins: Secondary | ICD-10-CM

## 2024-06-22 NOTE — Assessment & Plan Note (Signed)
Chronic, ongoing.  Followed by urology and continues treatment -- recent notes reviewed.

## 2024-06-22 NOTE — Assessment & Plan Note (Signed)
 Chronic, ongoing.  Continues on Protonix  daily. Risks of PPI use were discussed with patient including bone loss, C. Diff diarrhea, pneumonia, infections, CKD, electrolyte abnormalities. Verbalizes understanding and chooses to continue the medication. Check Mag level.

## 2024-06-22 NOTE — Progress Notes (Signed)
 BP 123/68 (BP Location: Left Arm, Patient Position: Sitting, Cuff Size: Normal)   Pulse 69   Temp 98.2 F (36.8 C) (Oral)   Resp 16   Ht 6' 0.01 (1.829 m)   Wt 188 lb (85.3 kg)   SpO2 97%   BMI 25.49 kg/m    Subjective:    Patient ID: Jason Estrada, male    DOB: [birth year], 83 y.o.   MRN: [id number]  HPI: Jason Estrada is a 83 y.o. male  Chief Complaint  Patient presents with   Hypertension/Hyperlipidema    Some home checks. Runs normal when they do check.    Chronic Kidney Disease    Plataued and at least not getting any worse. See's Dr. Elston next week.    Dementia    No better and no worse.    Wife present with him at bedside.  HYPERTENSION / HYPERLIPIDEMIA + DEMENTIA Taking Pravastatin , Hydralazine , Verapamil . Had a gout flare one week ago mostly in left hand.  Continues Allopurinol  and Colchicine . Last saw neurology on 02/27/24.  Taking Memantine  and Aricept . No recent falls.  They do have wheelchair now to assist, as patient has chronic right knee pain which affects gait at times. Takes B12 daily.  Able to do many things on own at home: dressing (although becoming a struggle), showering, and toileting. Does not drive. Continues to eat meals well at home, continues to snack in better. He is having a lot of wandering at night, not sleeping as well. Has Trazodone  on board, but continues to wander Satisfied with current treatment? yes Duration of hypertension: chronic BP monitoring frequency: on occasion BP range: <130/80 on average BP medication side effects: no Duration of hyperlipidemia: chronic Cholesterol medication side effects: no Cholesterol supplements: none Medication compliance: good compliance Aspirin : no Recent stressors: no Recurrent headaches: no Visual changes: no Palpitations: no Dyspnea: no Chest pain: no Lower extremity edema: no Dizzy/lightheaded: no   CHRONIC KIDNEY DISEASE Saw nephrology on 03/01/24, scheduled for follow-up upcoming.Follows  with urology for prostate cancer with last visit 11/13/23, radiation completed 02/15/22.    Takes Allopurinol  for gout with no recent flares.  Protonix  for GERD with benefit. CKD status: stable Medications renally dose: yes Previous renal evaluation: yes Pneumovax:  Up to Date Influenza Vaccine:  Up to Date   Relevant past medical, surgical, family and social history reviewed and updated as indicated. Interim medical history since our last visit reviewed. Allergies and medications reviewed and updated.  Review of Systems  Constitutional:  Negative for activity change, diaphoresis, fatigue and fever.  Respiratory:  Negative for cough, chest tightness, shortness of breath and wheezing.   Cardiovascular:  Negative for chest pain, palpitations and leg swelling.  Gastrointestinal: Negative.   Musculoskeletal:  Positive for arthralgias.  Psychiatric/Behavioral: Negative.     Per HPI unless specifically indicated above     Objective:    BP 123/68 (BP Location: Left Arm, Patient Position: Sitting, Cuff Size: Normal)   Pulse 69   Temp 98.2 F (36.8 C) (Oral)   Resp 16   Ht 6' 0.01 (1.829 m)   Wt 188 lb (85.3 kg)   SpO2 97%   BMI 25.49 kg/m   Wt Readings from Last 3 Encounters:  06/22/24 188 lb (85.3 kg)  02/27/24 191 lb (86.6 kg)  12/22/23 203 lb 3.2 oz (92.2 kg)    Physical Exam Vitals and nursing note reviewed.  Constitutional:      General: He is awake. He is not in acute distress.  Appearance: He is well-developed and well-groomed. He is not ill-appearing or toxic-appearing.  HENT:     Head: Normocephalic.     Right Ear: Hearing and external ear normal.     Left Ear: Hearing and external ear normal.  Eyes:     General: Lids are normal.     Extraocular Movements: Extraocular movements intact.     Conjunctiva/sclera: Conjunctivae normal.  Neck:     Thyroid: No thyromegaly.     Vascular: No carotid bruit.  Cardiovascular:     Rate and Rhythm: Normal rate and regular  rhythm.     Heart sounds: Normal heart sounds. No murmur heard.    No gallop.  Pulmonary:     Effort: No accessory muscle usage or respiratory distress.     Breath sounds: Normal breath sounds. No decreased breath sounds, wheezing or rales.  Abdominal:     General: Bowel sounds are normal. There is no distension.     Palpations: Abdomen is soft.     Tenderness: There is no abdominal tenderness.  Musculoskeletal:     Cervical back: Full passive range of motion without pain.     Right lower leg: No edema.     Left lower leg: No edema.  Lymphadenopathy:     Cervical: No cervical adenopathy.  Skin:    General: Skin is warm.     Capillary Refill: Capillary refill takes less than 2 seconds.  Neurological:     Mental Status: He is alert.     Deep Tendon Reflexes: Reflexes are normal and symmetric.     Reflex Scores:      Brachioradialis reflexes are 2+ on the right side and 2+ on the left side.      Patellar reflexes are 2+ on the right side and 2+ on the left side.    Comments: Oriented to person and place.  Psychiatric:        Attention and Perception: Attention normal.        Mood and Affect: Mood normal.        Speech: Speech normal.        Behavior: Behavior normal. Behavior is cooperative.        Thought Content: Thought content normal.    Results for orders placed or performed in visit on 05/12/24  PSA   Collection Time: 05/12/24  1:17 PM  Result Value Ref Range   Prostate Specific Ag, Serum <0.1 0.0 - 4.0 ng/mL      Assessment & Plan:   Problem List Items Addressed This Visit       Cardiovascular and Mediastinum   Essential hypertension   Chronic, ongoing with BP at goal for age today.  Recommend he monitor BP at least a few mornings a week at home and document.  DASH diet at home.  Continue current medication regimen and adjust as needed.  Labs today: up to date with nephrology.       Relevant Orders   TSH     Digestive   GERD without esophagitis   Chronic,  ongoing.  Continues on Protonix  daily. Risks of PPI use were discussed with patient including bone loss, C. Diff diarrhea, pneumonia, infections, CKD, electrolyte abnormalities. Verbalizes understanding and chooses to continue the medication. Check Mag level.      Relevant Orders   Magnesium     Endocrine   Primary hyperparathyroidism   Chronic, ongoing.  Followed by nephrology, continue this collaboration.  Recent notes and labs reviewed.  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  On memory care medications per neurology.  Continue these and collaboration with neurology, recent note reviewed.  Discussed with his wife if needed in future provider can place a referral to SW to assist with any needs.  Also discussed discussing with neurology changing to Mirtazapine for sleep, as is losing weight.  Has lost 15 lbs, but is eating well.  This may benefit appetite and maintaining weight + sleep pattern.        Genitourinary   Prostate cancer   Chronic, ongoing.  Followed by urology and continues treatment -- recent notes reviewed.      Chronic kidney disease, stage 4 (severe)   Chronic, ongoing.  Followed by nephrology, recent notes and labs reviewed.  Continue current medication regimen.        Other   Vitamin B12 deficiency   Chronic, ongoing.  <300 last labs.  Is not taking supplement consistently.  Take Vitamin B12 1000 MCG daily for overall nervous system health.  Check B12 level.      Relevant Orders   Vitamin B12   Hyperlipidemia   Chronic, ongoing.  Continue current medication regimen and adjust as needed.  Lipid panel today.         Relevant Orders   Lipid Panel w/o Chol/HDL Ratio   Gout   Chronic, stable with no recent flares.  Continue Allopurinol  as ordered and further renal dose if needed.  Check uric acid level.      Relevant Orders   Uric acid     Follow up plan: Return in about 6 months (around  12/21/2024) for HTN/HLD, CKD, DEMENTIA.

## 2024-06-22 NOTE — Assessment & Plan Note (Signed)
Chronic, ongoing.  Followed by nephrology, continue this collaboration.  Recent notes and labs reviewed. 

## 2024-06-22 NOTE — Assessment & Plan Note (Signed)
 Chronic, ongoing.  Continue current medication regimen and adjust as needed. Lipid panel today.

## 2024-06-22 NOTE — Assessment & Plan Note (Signed)
 Chronic, progressive.  On memory care medications per neurology.  Continue these and collaboration with neurology, recent note reviewed.  Discussed with his wife if needed in future provider can place a referral to SW to assist with any needs.  Also discussed discussing with neurology changing to Mirtazapine for sleep, as is losing weight.  Has lost 15 lbs, but is eating well.  This may benefit appetite and maintaining weight + sleep pattern.

## 2024-06-22 NOTE — Assessment & Plan Note (Signed)
 Chronic, stable with no recent flares.  Continue Allopurinol  as ordered and further renal dose if needed.  Check uric acid level.

## 2024-06-22 NOTE — Assessment & Plan Note (Signed)
 Chronic, ongoing with BP at goal for age today.  Recommend he monitor BP at least a few mornings a week at home and document.  DASH diet at home.  Continue current medication regimen and adjust as needed.  Labs today: up to date with nephrology.

## 2024-06-22 NOTE — Assessment & Plan Note (Signed)
 Chronic, ongoing.  <300 last labs.  Is not taking supplement consistently.  Take Vitamin B12 1000 MCG daily for overall nervous system health.  Check B12 level.

## 2024-06-22 NOTE — Assessment & Plan Note (Signed)
 Chronic, ongoing.  Followed by nephrology, recent notes and labs reviewed.  Continue current medication regimen.

## 2024-06-23 ENCOUNTER — Ambulatory Visit: Payer: Self-pay | Admitting: Nurse Practitioner

## 2024-06-23 LAB — LIPID PANEL W/O CHOL/HDL RATIO
Cholesterol, Total: 156 mg/dL (ref 100–199)
HDL: 58 mg/dL (ref >39)
LDL Chol Calc (NIH): 80 mg/dL (ref 0–99)
Triglycerides: 95 mg/dL (ref 0–149)
VLDL Cholesterol Cal: 18 mg/dL (ref 5–40)

## 2024-06-23 LAB — VITAMIN B12: Vitamin B-12: 185 pg/mL — ABNORMAL LOW (ref 232–1245)

## 2024-06-23 LAB — TSH: TSH: 0.917 u[IU]/mL (ref 0.450–4.500)

## 2024-06-23 LAB — MAGNESIUM: Magnesium: 2.2 mg/dL (ref 1.6–2.3)

## 2024-06-23 LAB — URIC ACID: Uric Acid: 7 mg/dL (ref 3.8–8.4)

## 2024-06-23 NOTE — Progress Notes (Signed)
 Contacted via MyChart  Good afternoon Jason Estrada and family, labs have returned and overall are stable with exception of B12 level which remains low. Is Jason Estrada taking Vitamin B12 1000 MCG daily? If he is consistently taking then we may want to change over to him get shots of B12 in the office for a little bit, our nurses can provide these.  Let me know. Keep being stellar!!  Thank you for allowing me to participate in your care.  I appreciate you. Kindest regards, Makinsley Schiavi

## 2024-06-29 ENCOUNTER — Other Ambulatory Visit: Payer: Self-pay | Admitting: Nurse Practitioner

## 2024-07-01 ENCOUNTER — Other Ambulatory Visit: Payer: Self-pay | Admitting: Nurse Practitioner

## 2024-07-01 NOTE — Telephone Encounter (Signed)
 Requested medications are due for refill today.  yes  Requested medications are on the active medications list.  yes  Last refill. 04/01/2024 #90 0 rf  Future visit scheduled.   Next year  Notes to clinic.  Labs are expired.    Requested Prescriptions  Pending Prescriptions Disp Refills   colchicine  0.6 MG tablet [Pharmacy Med Name: COLCHICINE  0.6 MG TABLET] 90 tablet 0    Sig: TAKE 1 TABLET BY MOUTH EVERY DAY     Endocrinology:  Gout Agents - colchicine  Failed - 07/01/2024 11:20 AM      Failed - Cr in normal range and within 360 days    Creatinine, Ser  Date Value Ref Range Status  08/06/2021 2.06 (H) 0.76 - 1.27 mg/dL Final         Failed - ALT in normal range and within 360 days    ALT  Date Value Ref Range Status  08/06/2021 29 0 - 44 IU/L Final         Failed - AST in normal range and within 360 days    AST  Date Value Ref Range Status  08/06/2021 40 0 - 40 IU/L Final         Failed - CBC within normal limits and completed in the last 12 months    WBC  Date Value Ref Range Status  02/06/2022 5.3 4.0 - 10.5 K/uL Final   RBC  Date Value Ref Range Status  02/06/2022 3.94 (L) 4.22 - 5.81 MIL/uL Final   Hemoglobin  Date Value Ref Range Status  02/06/2022 11.9 (L) 13.0 - 17.0 g/dL Final  88/78/7977 86.7 13.0 - 17.7 g/dL Final   HCT  Date Value Ref Range Status  02/06/2022 37.2 (L) 39.0 - 52.0 % Final   Hematocrit  Date Value Ref Range Status  08/06/2021 40.9 37.5 - 51.0 % Final   MCHC  Date Value Ref Range Status  02/06/2022 32.0 30.0 - 36.0 g/dL Final   [facility]  Date Value Ref Range Status  02/06/2022 30.2 26.0 - 34.0 pg Final   MCV  Date Value Ref Range Status  02/06/2022 94.4 80.0 - 100.0 fL Final  08/06/2021 93 79 - 97 fL Final   No results found for: PLTCOUNTKUC, LABPLAT, POCPLA RDW  Date Value Ref Range Status  02/06/2022 14.4 11.5 - 15.5 % Final  08/06/2021 12.2 11.6 - 15.4 % Final         Passed - Valid encounter within last 12  months    Recent Outpatient Visits           1 week ago Dementia due to Alzheimer's disease   [place] [facility] [place], Melanie T, NP   6 months ago Dementia due to Alzheimer's disease   [place] [facility] [place], Melanie DASEN, NP       Future Appointments             In 4 months Francisca, Redell BROCKS, MD [facility] [place]

## 2024-07-01 NOTE — Telephone Encounter (Signed)
 Copied from CRM #[id number]. Topic: Clinical - Medication Refill >> Jul 01, 2024 11:28 AM Montie POUR wrote: Medication:   colchicine  0.6 MG tablet.  Has the patient contacted their pharmacy? Yes (Agent: If no, request that the patient contact the pharmacy for the refill. If patient does not wish to contact the pharmacy document the reason why and proceed with request.) (Agent: If yes, when and what did the pharmacy advise?) [facility] on [facility]24 and has not heard anything back from NP Cannady.   This is the patient's preferred pharmacy:  [id number] [place] [place] - [address] [place] [address] Phone: [phone] Fax: [phone]  Is this the correct pharmacy for this prescription? Yes If no, delete pharmacy and type the correct one.   Has the prescription been filled recently? No  Is the patient out of the medication? No - He is almost out of medication.   Has the patient been seen for an appointment in the last year OR does the patient have an upcoming appointment? Yes  Can we respond through MyChart? Yes  Agent: Please be advised that Rx refills may take up to 3 business days. We ask that you follow-up with your pharmacy.

## 2024-07-01 NOTE — Telephone Encounter (Signed)
 Requested medication (s) are due for refill today: yes  Requested medication (s) are on the active medication list: yes  Last refill:  04/01/24  Future visit scheduled: {Yes  Notes to clinic:  04/01/24     Requested Prescriptions  Pending Prescriptions Disp Refills   colchicine  0.6 MG tablet [Pharmacy Med Name: COLCHICINE  0.6 MG TABLET] 90 tablet 0    Sig: TAKE 1 TABLET BY MOUTH EVERY DAY     Endocrinology:  Gout Agents - colchicine  Failed - 07/01/2024 11:05 AM      Failed - Cr in normal range and within 360 days    Creatinine, Ser  Date Value Ref Range Status  08/06/2021 2.06 (H) 0.76 - 1.27 mg/dL Final         Failed - ALT in normal range and within 360 days    ALT  Date Value Ref Range Status  08/06/2021 29 0 - 44 IU/L Final         Failed - AST in normal range and within 360 days    AST  Date Value Ref Range Status  08/06/2021 40 0 - 40 IU/L Final         Failed - CBC within normal limits and completed in the last 12 months    WBC  Date Value Ref Range Status  02/06/2022 5.3 4.0 - 10.5 K/uL Final   RBC  Date Value Ref Range Status  02/06/2022 3.94 (L) 4.22 - 5.81 MIL/uL Final   Hemoglobin  Date Value Ref Range Status  02/06/2022 11.9 (L) 13.0 - 17.0 g/dL Final  88/78/7977 86.7 13.0 - 17.7 g/dL Final   HCT  Date Value Ref Range Status  02/06/2022 37.2 (L) 39.0 - 52.0 % Final   Hematocrit  Date Value Ref Range Status  08/06/2021 40.9 37.5 - 51.0 % Final   MCHC  Date Value Ref Range Status  02/06/2022 32.0 30.0 - 36.0 g/dL Final   [facility]  Date Value Ref Range Status  02/06/2022 30.2 26.0 - 34.0 pg Final   MCV  Date Value Ref Range Status  02/06/2022 94.4 80.0 - 100.0 fL Final  08/06/2021 93 79 - 97 fL Final   No results found for: PLTCOUNTKUC, LABPLAT, POCPLA RDW  Date Value Ref Range Status  02/06/2022 14.4 11.5 - 15.5 % Final  08/06/2021 12.2 11.6 - 15.4 % Final         Passed - Valid encounter within last 12 months    Recent  Outpatient Visits           1 week ago Dementia due to Alzheimer's disease   [place] [facility] [place], Melanie T, NP   6 months ago Dementia due to Alzheimer's disease   [place] [facility] [place], Melanie DASEN, NP       Future Appointments             In 4 months Francisca, Redell BROCKS, MD [facility]rology [place]

## 2024-07-02 NOTE — Telephone Encounter (Signed)
 Too soon for refill, refilled 07/01/24.  Requested Prescriptions  Pending Prescriptions Disp Refills   colchicine  0.6 MG tablet 90 tablet 0    Sig: Take 1 tablet (0.6 mg total) by mouth daily.     Endocrinology:  Gout Agents - colchicine  Failed - 07/02/2024  3:31 PM      Failed - Cr in normal range and within 360 days    Creatinine, Ser  Date Value Ref Range Status  08/06/2021 2.06 (H) 0.76 - 1.27 mg/dL Final         Failed - ALT in normal range and within 360 days    ALT  Date Value Ref Range Status  08/06/2021 29 0 - 44 IU/L Final         Failed - AST in normal range and within 360 days    AST  Date Value Ref Range Status  08/06/2021 40 0 - 40 IU/L Final         Failed - CBC within normal limits and completed in the last 12 months    WBC  Date Value Ref Range Status  02/06/2022 5.3 4.0 - 10.5 K/uL Final   RBC  Date Value Ref Range Status  02/06/2022 3.94 (L) 4.22 - 5.81 MIL/uL Final   Hemoglobin  Date Value Ref Range Status  02/06/2022 11.9 (L) 13.0 - 17.0 g/dL Final  88/78/7977 86.7 13.0 - 17.7 g/dL Final   HCT  Date Value Ref Range Status  02/06/2022 37.2 (L) 39.0 - 52.0 % Final   Hematocrit  Date Value Ref Range Status  08/06/2021 40.9 37.5 - 51.0 % Final   MCHC  Date Value Ref Range Status  02/06/2022 32.0 30.0 - 36.0 g/dL Final   [facility]  Date Value Ref Range Status  02/06/2022 30.2 26.0 - 34.0 pg Final   MCV  Date Value Ref Range Status  02/06/2022 94.4 80.0 - 100.0 fL Final  08/06/2021 93 79 - 97 fL Final   No results found for: PLTCOUNTKUC, LABPLAT, POCPLA RDW  Date Value Ref Range Status  02/06/2022 14.4 11.5 - 15.5 % Final  08/06/2021 12.2 11.6 - 15.4 % Final         Passed - Valid encounter within last 12 months    Recent Outpatient Visits           1 week ago Dementia due to Alzheimer's disease   [place] [facility] [place], Melanie T, NP   6 months ago Dementia due to Alzheimer's disease   [place]  [facility] [place], Melanie DASEN, NP       Future Appointments             In 4 months Francisca, Redell BROCKS, MD [facility]rology [place]

## 2024-08-06 ENCOUNTER — Other Ambulatory Visit: Payer: Self-pay | Admitting: Physician Assistant

## 2024-08-14 ENCOUNTER — Other Ambulatory Visit: Payer: Self-pay | Admitting: Nurse Practitioner

## 2024-08-18 NOTE — Telephone Encounter (Signed)
 Requested Prescriptions  Pending Prescriptions Disp Refills   folic acid  (FOLVITE ) 1 MG tablet [Pharmacy Med Name: FOLIC ACID  1 MG TABLET] 90 tablet 0    Sig: TAKE 1 TABLET BY MOUTH EVERY DAY     Endocrinology:  Vitamins Passed - 08/18/2024 11:04 AM      Passed - Valid encounter within last 12 months    Recent Outpatient Visits           1 month ago Dementia due to Alzheimer's disease   [place] [facility] [place], Melanie T, NP   8 months ago Dementia due to Alzheimer's disease   [place] [facility] [place], Melanie DASEN, NP       Future Appointments             In 3 months Francisca, Redell BROCKS, MD [facility] [place]

## 2024-08-22 ENCOUNTER — Inpatient Hospital Stay
Admission: EM | Admit: 2024-08-22 | Discharge: 2024-08-26 | DRG: 872 | Disposition: A | Discharge status: Skilled Nursing Facility | Attending: Hospitalist | Admitting: Hospitalist

## 2024-08-22 ENCOUNTER — Other Ambulatory Visit: Payer: Self-pay

## 2024-08-22 ENCOUNTER — Emergency Department

## 2024-08-22 DIAGNOSIS — R6510 Systemic inflammatory response syndrome (SIRS) of non-infectious origin without acute organ dysfunction: Secondary | ICD-10-CM

## 2024-08-22 DIAGNOSIS — R531 Weakness: Principal | ICD-10-CM

## 2024-08-22 DIAGNOSIS — G309 Alzheimer's disease, unspecified: Secondary | ICD-10-CM

## 2024-08-22 DIAGNOSIS — N184 Chronic kidney disease, stage 4 (severe): Secondary | ICD-10-CM | POA: Diagnosis present

## 2024-08-22 DIAGNOSIS — F0280 Dementia in other diseases classified elsewhere without behavioral disturbance: Secondary | ICD-10-CM | POA: Diagnosis present

## 2024-08-22 DIAGNOSIS — R410 Disorientation, unspecified: Secondary | ICD-10-CM

## 2024-08-22 DIAGNOSIS — I10 Essential (primary) hypertension: Secondary | ICD-10-CM | POA: Diagnosis present

## 2024-08-22 DIAGNOSIS — C61 Malignant neoplasm of prostate: Secondary | ICD-10-CM | POA: Diagnosis present

## 2024-08-22 DIAGNOSIS — M109 Gout, unspecified: Secondary | ICD-10-CM | POA: Diagnosis present

## 2024-08-22 LAB — URINALYSIS, ROUTINE W REFLEX MICROSCOPIC
Bacteria, UA: NONE SEEN
Bilirubin Urine: NEGATIVE
Glucose, UA: NEGATIVE mg/dL
Hgb urine dipstick: NEGATIVE
Ketones, ur: NEGATIVE mg/dL
Leukocytes,Ua: NEGATIVE
Nitrite: NEGATIVE
Protein, ur: 30 mg/dL — AB
Specific Gravity, Urine: 1.015 (ref 1.005–1.030)
pH: 6 (ref 5.0–8.0)

## 2024-08-22 LAB — CBC
HCT: 37.4 % — ABNORMAL LOW (ref 39.0–52.0)
Hemoglobin: 12 g/dL — ABNORMAL LOW (ref 13.0–17.0)
MCH: 31.4 pg (ref 26.0–34.0)
MCHC: 32.1 g/dL (ref 30.0–36.0)
MCV: 97.9 fL (ref 80.0–100.0)
Platelets: 249 K/uL (ref 150–400)
RBC: 3.82 MIL/uL — ABNORMAL LOW (ref 4.22–5.81)
RDW: 12.8 % (ref 11.5–15.5)
WBC: 12.2 K/uL — ABNORMAL HIGH (ref 4.0–10.5)
nRBC: 0 % (ref 0.0–0.2)

## 2024-08-22 LAB — COMPREHENSIVE METABOLIC PANEL WITH GFR
ALT: 6 U/L (ref 0–44)
AST: 19 U/L (ref 15–41)
Albumin: 3.7 g/dL (ref 3.5–5.0)
Alkaline Phosphatase: 95 U/L (ref 38–126)
Anion gap: 14 (ref 5–15)
BUN: 25 mg/dL — ABNORMAL HIGH (ref 8–23)
CO2: 21 mmol/L — ABNORMAL LOW (ref 22–32)
Calcium: 9.1 mg/dL (ref 8.9–10.3)
Chloride: 106 mmol/L (ref 98–111)
Creatinine, Ser: 1.9 mg/dL — ABNORMAL HIGH (ref 0.61–1.24)
GFR, Estimated: 35 mL/min — ABNORMAL LOW (ref >60)
Glucose, Bld: 135 mg/dL — ABNORMAL HIGH (ref 70–99)
Potassium: 4.1 mmol/L (ref 3.5–5.1)
Sodium: 141 mmol/L (ref 135–145)
Total Bilirubin: 0.6 mg/dL (ref 0.0–1.2)
Total Protein: 6.7 g/dL (ref 6.5–8.1)

## 2024-08-22 LAB — RESP PANEL BY RT-PCR (RSV, FLU A&B, COVID)  RVPGX2
Influenza A by PCR: NEGATIVE
Influenza B by PCR: NEGATIVE
Resp Syncytial Virus by PCR: NEGATIVE
SARS Coronavirus 2 by RT PCR: NEGATIVE

## 2024-08-22 LAB — LACTIC ACID, PLASMA: Lactic Acid, Venous: 2.1 mmol/L (ref 0.5–1.9)

## 2024-08-22 MED ORDER — SODIUM CHLORIDE 0.9 % IV BOLUS
500.0000 mL | Freq: Once | INTRAVENOUS | Status: AC
  Administered 2024-08-22: 500 mL via INTRAVENOUS

## 2024-08-22 MED ORDER — ACETAMINOPHEN 500 MG PO TABS
1000.0000 mg | ORAL_TABLET | Freq: Once | ORAL | Status: AC
  Administered 2024-08-22: 1000 mg via ORAL
  Filled 2024-08-22: qty 2

## 2024-08-22 NOTE — ED Triage Notes (Signed)
 Pt comes in via pov with complaints of weakness that started today. Pt has no complaints of pain. Pt has a history of dementia. Pt's wife reports the smell of foul smelling urine. Pt also has a history of gout and dementia.

## 2024-08-22 NOTE — ED Provider Notes (Signed)
 [facility] Provider Note    Event Date/Time   First MD Initiated Contact with Patient 08/22/24 2023     (approximate)   History   Chief Complaint: Weakness   HPI  Jason Estrada is a 83 y.o. male with a history of Alzheimer's dementia, CKD 4, prostate cancer who comes ED complaining of generalized weakness today.  Patient got up and had breakfast as usual, but then when he went to get up from his recliner later in the morning, he was unable to get himself out of the lift chair onto his feet.  With assistance from spouse and a friend, they were unable to get him to be able to support his own weight.  Denies pain, no falls or trauma.  No vomiting or diarrhea.  Spouse notes that urine has had a foul odor for several days.        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 This was completed in June 2023, and initial ADT injection was given 12/10/2021.    Sinus tachycardia 09/26/2021   Vitamin B12 deficiency 02/18/2023   229 December 2023    Current Outpatient Rx   Order #: [id number] Class: Normal   Order #: [id number] Class: Normal   Order #: [id number] Class: Normal   Order #: [id number] Class: Historical Med   Order #: [id number] Class: Normal   Order #: [id number] Class: Normal   Order #:  [id number] Class: Normal   Order #: [id number] Class: Normal   Order #: [id number] Class: Normal   Order #: [id number] Class: Normal   Order #: [id number] Class: Normal   Order #: [id number] Class: Normal    Past Surgical History:  Procedure Laterality Date   CHOLECYSTECTOMY      Physical Exam   Triage Vital Signs: ED Triage Vitals  Encounter Vitals Group     BP 08/22/24 1805 (!) 156/88     Girls Systolic BP Percentile --      Girls Diastolic BP Percentile --      Boys Systolic BP Percentile --      Boys Diastolic BP Percentile --      Pulse Rate 08/22/24 1805 100     Resp 08/22/24 1805 18     Temp 08/22/24 1805 99.5 F (37.5 C)     Temp src --      SpO2 08/22/24 1805 95 %     Weight 08/22/24 1806 188 lb 0.8 oz (85.3 kg)     Height 08/22/24 1806 6' (1.829 m)     Head Circumference --      Peak Flow --      Pain Score --      Pain Loc --      Pain Education --      Exclude from Growth Chart --     Most recent vital signs: Vitals:   08/22/24 2100 08/22/24 2149  BP: (!) 156/79   Pulse: 84   Resp: 16   Temp:  99.1 F (37.3 C)  SpO2: 100%     General: Awake, no distress.  CV:  Good peripheral perfusion.  Tachycardia heart rate 100 Resp:  Normal effort.  Clear lungs Abd:  No distention.  Soft nontender Other:  Moist oral mucosa   ED Results / Procedures / Treatments   Labs (all labs ordered are listed, but only abnormal results are displayed) Labs Reviewed  COMPREHENSIVE METABOLIC PANEL WITH GFR - Abnormal; Notable for the following components:      Result Value   CO2 21 (*)    Glucose, Bld 135 (*)    BUN 25 (*)    Creatinine, Ser 1.90 (*)    GFR, Estimated 35 (*)    All other components within normal limits  CBC - Abnormal; Notable for the following components:   WBC 12.2 (*)    RBC 3.82 (*)    Hemoglobin 12.0 (*)    HCT 37.4 (*)    All other components within normal limits  URINALYSIS, ROUTINE W REFLEX MICROSCOPIC - Abnormal; Notable for the following  components:   Color, Urine YELLOW (*)    APPearance CLEAR (*)    Protein, ur 30 (*)    All other components within normal limits  LACTIC ACID, PLASMA - Abnormal; Notable for the following components:   Lactic Acid, Venous 2.1 (*)    All other components within normal limits  RESP PANEL BY RT-PCR (RSV, FLU A&B, COVID)  RVPGX2  CBG MONITORING, ED     EKG Interpreted by me Sinus tachycardia rate 104.  Normal axis and intervals.  Poor R wave progression.  No acute ischemic changes.   RADIOLOGY    PROCEDURES:  Procedures   MEDICATIONS ORDERED IN ED: Medications  acetaminophen  (TYLENOL ) tablet 1,000 mg (1,000 mg Oral Given 08/22/24 2108)  sodium chloride  0.9 % bolus 500 mL (0 mLs Intravenous Stopped 08/22/24 2258)     IMPRESSION / MDM / ASSESSMENT AND PLAN / ED COURSE  I reviewed the triage vital signs and the nursing notes.  DDx: UTI, urinary retention, electrolyte derangement, AKI, anemia, dehydration  Patient's presentation is most consistent with acute presentation with potential threat to life or bodily function.  Patient presents with generalized weakness, foul-smelling urine.  Serum labs at baseline.  Exam unrevealing.  Vital signs show low-grade fever, mild tachycardia.  Will obtain RVP as well while giving fluids, obtaining UA and bladder scan.   ----------------------------------------- 11:24 PM on 08/22/2024 ----------------------------------------- UA unremarkable, bladder scan normal.  RVP still pending.  Spouse reiterates that patient has had a precipitous decline in his function today.  I do not see any focal strokelike findings on exam.  He does have evidence of left elbow gout flare which is recurrent issue for him.  Will add on chest x-ray, CT head, plan for hospitalization due to his worsened confusion and acute functional decline.      FINAL CLINICAL IMPRESSION(S) / ED DIAGNOSES   Final diagnoses:  Generalized weakness  Alzheimer's dementia,  unspecified dementia severity, unspecified timing of dementia onset, unspecified whether behavioral, psychotic, or mood disturbance or anxiety (HCC)  Confusion     Rx / DC Orders   ED Discharge Orders     None        Note:  This document was prepared using Dragon voice recognition software and may include unintentional dictation errors.   Viviann Pastor, MD 08/22/24 2325

## 2024-08-22 NOTE — ED Notes (Addendum)
 CRITICAL VALUE STICKER  CRITICAL VALUE: Lactic 2.1   RECEIVER (on-site recipient of call): Con ED RN Nurse First   DATE & TIME NOTIFIED: 12/7 2004   MESSENGER (representative from lab): NA  MD NOTIFIED: Viviann   TIME OF NOTIFICATION: 12/7 2016   RESPONSE: pts acuity increased with critical lactic. Pt moved to next available room. Pt transported to room 5 by this RN. EDP informed of critical value

## 2024-08-23 DIAGNOSIS — G309 Alzheimer's disease, unspecified: Secondary | ICD-10-CM | POA: Diagnosis present

## 2024-08-23 DIAGNOSIS — Z8546 Personal history of malignant neoplasm of prostate: Secondary | ICD-10-CM | POA: Diagnosis not present

## 2024-08-23 DIAGNOSIS — I129 Hypertensive chronic kidney disease with stage 1 through stage 4 chronic kidney disease, or unspecified chronic kidney disease: Secondary | ICD-10-CM | POA: Diagnosis present

## 2024-08-23 DIAGNOSIS — F0280 Dementia in other diseases classified elsewhere without behavioral disturbance: Secondary | ICD-10-CM | POA: Diagnosis present

## 2024-08-23 DIAGNOSIS — Z1152 Encounter for screening for COVID-19: Secondary | ICD-10-CM | POA: Diagnosis not present

## 2024-08-23 DIAGNOSIS — R6510 Systemic inflammatory response syndrome (SIRS) of non-infectious origin without acute organ dysfunction: Secondary | ICD-10-CM

## 2024-08-23 DIAGNOSIS — M71122 Other infective bursitis, left elbow: Secondary | ICD-10-CM | POA: Diagnosis present

## 2024-08-23 DIAGNOSIS — K219 Gastro-esophageal reflux disease without esophagitis: Secondary | ICD-10-CM | POA: Diagnosis present

## 2024-08-23 DIAGNOSIS — L89151 Pressure ulcer of sacral region, stage 1: Secondary | ICD-10-CM | POA: Diagnosis present

## 2024-08-23 DIAGNOSIS — M009 Pyogenic arthritis, unspecified: Secondary | ICD-10-CM | POA: Diagnosis present

## 2024-08-23 DIAGNOSIS — N184 Chronic kidney disease, stage 4 (severe): Secondary | ICD-10-CM | POA: Diagnosis present

## 2024-08-23 DIAGNOSIS — A419 Sepsis, unspecified organism: Secondary | ICD-10-CM | POA: Diagnosis present

## 2024-08-23 DIAGNOSIS — E210 Primary hyperparathyroidism: Secondary | ICD-10-CM | POA: Diagnosis present

## 2024-08-23 DIAGNOSIS — E872 Acidosis, unspecified: Secondary | ICD-10-CM | POA: Diagnosis present

## 2024-08-23 DIAGNOSIS — R410 Disorientation, unspecified: Secondary | ICD-10-CM | POA: Diagnosis present

## 2024-08-23 DIAGNOSIS — Z79899 Other long term (current) drug therapy: Secondary | ICD-10-CM | POA: Diagnosis not present

## 2024-08-23 DIAGNOSIS — M1A9XX1 Chronic gout, unspecified, with tophus (tophi): Secondary | ICD-10-CM | POA: Diagnosis present

## 2024-08-23 DIAGNOSIS — E785 Hyperlipidemia, unspecified: Secondary | ICD-10-CM | POA: Diagnosis present

## 2024-08-23 LAB — SEDIMENTATION RATE: Sed Rate: 63 mm/h — ABNORMAL HIGH (ref 0–20)

## 2024-08-23 LAB — LACTIC ACID, PLASMA
Lactic Acid, Venous: 1 mmol/L (ref 0.5–1.9)
Lactic Acid, Venous: 1.5 mmol/L (ref 0.5–1.9)

## 2024-08-23 LAB — PROCALCITONIN: Procalcitonin: 0.27 ng/mL

## 2024-08-23 LAB — URIC ACID: Uric Acid, Serum: 6.5 mg/dL (ref 3.7–8.6)

## 2024-08-23 MED ORDER — HYDRALAZINE HCL 25 MG PO TABS
25.0000 mg | ORAL_TABLET | Freq: Three times a day (TID) | ORAL | Status: DC
  Administered 2024-08-23 – 2024-08-26 (×10): 25 mg via ORAL
  Filled 2024-08-23 (×11): qty 1

## 2024-08-23 MED ORDER — VANCOMYCIN HCL IN DEXTROSE 1-5 GM/200ML-% IV SOLN
1000.0000 mg | INTRAVENOUS | Status: DC
  Administered 2024-08-24 – 2024-08-26 (×3): 1000 mg via INTRAVENOUS
  Filled 2024-08-23 (×3): qty 200

## 2024-08-23 MED ORDER — PRAVASTATIN SODIUM 20 MG PO TABS
40.0000 mg | ORAL_TABLET | Freq: Every day | ORAL | Status: DC
  Administered 2024-08-23 – 2024-08-26 (×4): 40 mg via ORAL
  Filled 2024-08-23 (×4): qty 2

## 2024-08-23 MED ORDER — TRAZODONE HCL 50 MG PO TABS
25.0000 mg | ORAL_TABLET | Freq: Every day | ORAL | Status: DC
  Administered 2024-08-23 – 2024-08-25 (×4): 25 mg via ORAL
  Filled 2024-08-23 (×4): qty 1

## 2024-08-23 MED ORDER — HYDROCODONE-ACETAMINOPHEN 5-325 MG PO TABS
1.0000 | ORAL_TABLET | ORAL | Status: DC | PRN
  Administered 2024-08-23 – 2024-08-25 (×2): 1 via ORAL
  Filled 2024-08-23 (×2): qty 1

## 2024-08-23 MED ORDER — SODIUM CHLORIDE 0.9 % IV SOLN
2.0000 g | INTRAVENOUS | Status: DC
  Administered 2024-08-23 – 2024-08-26 (×4): 2 g via INTRAVENOUS
  Filled 2024-08-23 (×4): qty 20

## 2024-08-23 MED ORDER — GERHARDT'S BUTT CREAM
TOPICAL_CREAM | Freq: Three times a day (TID) | CUTANEOUS | Status: DC
  Administered 2024-08-23: 1 via TOPICAL
  Filled 2024-08-23: qty 60

## 2024-08-23 MED ORDER — ONDANSETRON HCL 4 MG/2ML IJ SOLN: 4.0000 mg | Freq: Four times a day (QID) | INTRAMUSCULAR | Status: DC | PRN

## 2024-08-23 MED ORDER — DONEPEZIL HCL 23 MG PO TABS
23.0000 mg | ORAL_TABLET | Freq: Every day | ORAL | Status: DC
  Administered 2024-08-23 – 2024-08-26 (×4): 23 mg via ORAL
  Filled 2024-08-23 (×4): qty 1

## 2024-08-23 MED ORDER — ACETAMINOPHEN 650 MG RE SUPP: 650.0000 mg | Freq: Four times a day (QID) | RECTAL | Status: DC | PRN

## 2024-08-23 MED ORDER — ENOXAPARIN SODIUM 40 MG/0.4ML IJ SOSY
40.0000 mg | PREFILLED_SYRINGE | INTRAMUSCULAR | Status: DC
  Administered 2024-08-23 – 2024-08-26 (×4): 40 mg via SUBCUTANEOUS
  Filled 2024-08-23 (×4): qty 0.4

## 2024-08-23 MED ORDER — ALBUTEROL SULFATE (2.5 MG/3ML) 0.083% IN NEBU: 2.5000 mg | INHALATION_SOLUTION | RESPIRATORY_TRACT | Status: DC | PRN

## 2024-08-23 MED ORDER — LACTATED RINGERS IV SOLN
150.0000 mL/h | INTRAVENOUS | Status: DC
  Administered 2024-08-23: 150 mL/h via INTRAVENOUS

## 2024-08-23 MED ORDER — ONDANSETRON HCL 4 MG PO TABS: 4.0000 mg | ORAL_TABLET | Freq: Four times a day (QID) | ORAL | Status: DC | PRN

## 2024-08-23 MED ORDER — COLCHICINE 0.6 MG PO TABS
0.6000 mg | ORAL_TABLET | Freq: Every day | ORAL | Status: DC
  Administered 2024-08-23 – 2024-08-26 (×4): 0.6 mg via ORAL
  Filled 2024-08-23 (×4): qty 1

## 2024-08-23 MED ORDER — PANTOPRAZOLE SODIUM 20 MG PO TBEC
20.0000 mg | DELAYED_RELEASE_TABLET | Freq: Every day | ORAL | Status: DC
  Administered 2024-08-23 – 2024-08-26 (×4): 20 mg via ORAL
  Filled 2024-08-23 (×4): qty 1

## 2024-08-23 MED ORDER — VANCOMYCIN HCL 1750 MG/350ML IV SOLN
1750.0000 mg | Freq: Once | INTRAVENOUS | Status: AC
  Administered 2024-08-23: 1750 mg via INTRAVENOUS
  Filled 2024-08-23: qty 350

## 2024-08-23 MED ORDER — ACETAMINOPHEN 325 MG PO TABS: 650.0000 mg | ORAL_TABLET | Freq: Four times a day (QID) | ORAL | Status: DC | PRN

## 2024-08-23 MED ORDER — HALOPERIDOL LACTATE 5 MG/ML IJ SOLN
1.0000 mg | Freq: Four times a day (QID) | INTRAMUSCULAR | Status: AC | PRN
  Administered 2024-08-23 – 2024-08-25 (×2): 1 mg via INTRAVENOUS
  Filled 2024-08-23 (×2): qty 1

## 2024-08-23 MED ORDER — MEMANTINE HCL 5 MG PO TABS
10.0000 mg | ORAL_TABLET | Freq: Two times a day (BID) | ORAL | Status: DC
  Administered 2024-08-23 – 2024-08-26 (×7): 10 mg via ORAL
  Filled 2024-08-23 (×7): qty 2

## 2024-08-23 MED ORDER — LACTATED RINGERS IV SOLN
150.0000 mL/h | INTRAVENOUS | Status: AC
  Administered 2024-08-23: 150 mL/h via INTRAVENOUS

## 2024-08-23 NOTE — H&P (Addendum)
 History and Physical    Patient: Jason Estrada DOB: [birth year] DOA: 08/22/2024 DOS: the patient was seen and examined on 08/23/2024 PCP: [name], NP  Patient coming from: Home  Chief Complaint:  Chief Complaint  Patient presents with   Weakness    HPI: Jason Estrada is a 83 y.o. male with medical history significant for CKD 4, hypertension, gout, dementia being admitted with SIRS secondary to a gout flare left elbow versus SIRS/sepsis from olecranon bursitis/septic joint.  He was brought in by family with generalized weakness.  His wife noticed that he was having difficulty getting up from his lift chair and stated when she tried to help him up he seemed to wince when she held his left elbow.  After arriving in the ED, she noticed the elbow appeared red and inflamed as usually he wears long sleeves.  She states the same thing happened when he was admitted for flareup of gout of his left wrist a few years prior.  He On arrival he had a low-grade temp of 99.5 and tachycardic to the 100 with otherwise unremarkable vitals.  Labs notable for leukocytosis of 12,200 with a lactic acid of 2.1->1.0.  Urinalysis sterile and respiratory viral panel negative for COVID flu and RSV.  Other labs for the most part near baseline with creatinine 1.9 EKG showed sinus tachycardia at 104 Chest x-ray nonacute.  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 This was completed in June 2023, and initial ADT injection was given 12/10/2021.    Sinus tachycardia 09/26/2021   Vitamin B12 deficiency 02/18/2023   229 December 2023   Past Surgical History:  Procedure Laterality Date   CHOLECYSTECTOMY     Social History:  reports that he has never smoked. He has never been exposed to tobacco smoke. He has never used smokeless tobacco. He reports current alcohol use of about 7.0 - 14.0 standard drinks of alcohol per week. He reports that he does not currently use drugs.  No Known Allergies  History reviewed. No pertinent family history.  Prior to Admission medications   Medication Sig Start Date End Date Taking? Authorizing Provider  allopurinol  (ZYLOPRIM ) 100 MG tablet Take 1 tablet (100 mg total) by mouth daily. 08/21/23   Cannady, Jolene T, NP  baclofen  (LIORESAL ) 10 MG tablet TAKE 1 TABLET BY MOUTH EVERY DAY AS NEEDED 03/15/24   Cannady, Jolene T, NP  colchicine  0.6 MG tablet TAKE 1 TABLET BY MOUTH EVERY DAY 07/01/24   Cannady, Jolene T, NP  cyanocobalamin (VITAMIN B12) 1000 MCG tablet Take 1,000 mcg by mouth daily.    [provider]  donepezil  (ARICEPT ) 23 MG TABS tablet TAKE  1 TABLET (23 MG TOTAL) BY MOUTH DAILY. 08/06/24   Wertman, Sara E, PA-C  folic acid  (FOLVITE ) 1 MG tablet TAKE 1 TABLET BY MOUTH EVERY DAY 08/18/24   Cannady, Jolene T, NP  hydrALAZINE  (APRESOLINE ) 50 MG tablet Take 0.5 tablets (25 mg total) by mouth in the morning and at bedtime. 08/21/23   Cannady, Jolene T, NP  memantine  (NAMENDA ) 10 MG tablet Take 1 tab twice a day 08/28/23   Wertman, Sara E, PA-C  pantoprazole  (PROTONIX ) 20 MG tablet Take 1 tablet (20 mg total) by mouth daily. 08/21/23    Cannady, Jolene T, NP  pravastatin  (PRAVACHOL ) 40 MG tablet Take 1 tablet (40 mg total) by mouth daily. 08/21/23   Cannady, Jolene T, NP  traZODone  (DESYREL ) 50 MG tablet TAKE HALF TABLET AT NIGHT, MAY INCREASE TO ONE FULL TABLET IF NEEDED 03/23/24   Dina, Sara E, PA-C  verapamil  (CALAN -SR) 240 MG CR tablet Take 1 tablet (240 mg total) by mouth daily. 08/21/23   [name], NP    Physical Exam: Vitals:   08/22/24 1806 08/22/24 2025 08/22/24 2100 08/22/24 2149  BP:  (!) 178/90 (!) 156/79   Pulse:  85 84   Resp:   16   Temp:    99.1 F (37.3 C)  SpO2:  100% 100%   Weight: 85.3 kg     Height: 6' (1.829 m)      Physical Exam Vitals and nursing note reviewed.  Constitutional:      General: He is not in acute distress. HENT:     Head: Normocephalic and atraumatic.  Cardiovascular:     Rate and Rhythm: Normal rate and regular rhythm.     Heart sounds: Normal heart sounds.  Pulmonary:     Effort: Pulmonary effort is normal.     Breath sounds: Normal breath sounds.  Abdominal:     Palpations: Abdomen is soft.     Tenderness: There is no abdominal tenderness.  Musculoskeletal:     Left elbow: Swelling and effusion present. Decreased range of motion. Tenderness present in olecranon process.     Comments: See photo below  Neurological:     Mental Status: Mental status is at baseline.     Labs on Admission: I have personally reviewed following labs and imaging studies  CBC: Recent Labs  Lab 08/22/24 1808  WBC 12.2*  HGB 12.0*  HCT 37.4*  MCV 97.9  PLT 249   Basic Metabolic Panel: Recent Labs  Lab 08/22/24 1808  NA 141  K 4.1  CL 106  CO2 21*  GLUCOSE 135*  BUN 25*  CREATININE 1.90*  CALCIUM 9.1   GFR: Estimated Creatinine Clearance: 33.5 mL/min (A) (by C-G formula based on SCr of 1.9 mg/dL (H)). Liver Function Tests: Recent Labs  Lab 08/22/24 1808  AST 19  ALT 6  ALKPHOS 95  BILITOT 0.6  PROT 6.7  ALBUMIN 3.7   No results for input(s): LIPASE,  AMYLASE in the last 168 hours. No results for input(s): AMMONIA in the last 168 hours. Coagulation Profile: No results for input(s): INR, PROTIME in the last 168 hours. Cardiac Enzymes: No results for input(s): CKTOTAL, CKMB, CKMBINDEX, TROPONINI in the last 168 hours. BNP (last 3 results) No results for input(s): PROBNP in the last 8760 hours. HbA1C: No results for input(s): HGBA1C in the last 72 hours. CBG: No results for input(s): GLUCAP in the last 168 hours. Lipid Profile: No results for input(s): CHOL, HDL, LDLCALC, TRIG, CHOLHDL, LDLDIRECT in  the last 72 hours. Thyroid Function Tests: No results for input(s): TSH, T4TOTAL, FREET4, T3FREE, THYROIDAB in the last 72 hours. Anemia Panel: No results for input(s): VITAMINB12, FOLATE, FERRITIN, TIBC, IRON, RETICCTPCT in the last 72 hours. Urine analysis:    Component Value Date/Time   COLORURINE YELLOW (A) 08/22/2024 2035   APPEARANCEUR CLEAR (A) 08/22/2024 2035   APPEARANCEUR Clear 04/27/2021 1000   LABSPEC 1.015 08/22/2024 2035   PHURINE 6.0 08/22/2024 2035   GLUCOSEU NEGATIVE 08/22/2024 2035   HGBUR NEGATIVE 08/22/2024 2035   BILIRUBINUR NEGATIVE 08/22/2024 2035   BILIRUBINUR Negative 04/27/2021 1000   KETONESUR NEGATIVE 08/22/2024 2035   PROTEINUR 30 (A) 08/22/2024 2035   NITRITE NEGATIVE 08/22/2024 2035   LEUKOCYTESUR NEGATIVE 08/22/2024 2035    Radiological Exams on Admission: CT Head Wo Contrast Result Date: 08/22/2024 EXAM: CT HEAD WITHOUT CONTRAST 08/22/2024 11:24:29 PM TECHNIQUE: CT of the head was performed without the administration of intravenous contrast. Automated exposure control, iterative reconstruction, and/or weight based adjustment of the mA/kV was utilized to reduce the radiation dose to as low as reasonably achievable. COMPARISON: MRI 06/20/2021. CLINICAL HISTORY: Mental status change, unknown cause. FINDINGS: BRAIN AND VENTRICLES: No acute  hemorrhage. No evidence of acute infarct. No hydrocephalus. No extra-axial collection. No mass effect or midline shift. Atrophy and chronic small vessel disease throughout the deep white matter. ORBITS: No acute abnormality. SINUSES: No acute abnormality. SOFT TISSUES AND SKULL: No acute soft tissue abnormality. No skull fracture. IMPRESSION: 1. No acute intracranial abnormality. 2. Atrophy and chronic small vessel disease throughout the deep white matter. Electronically signed by: Franky Crease MD 08/22/2024 11:27 PM EST RP Workstation: [id number]   DG Elbow Complete Left Result Date: 08/22/2024 EXAM: 3 VIEW(S) XRAY OF THE LEFT ELBOW COMPARISON: None available. CLINICAL HISTORY: left elbow swelling FINDINGS: BONES AND JOINTS: Cortical irregularity of the olecranon, possible representing chronic erosion. Extensive enthesopathy involving the triceps tendon. No significant joint space narrowing noted. No definite effusion. No acute fracture or dislocation. 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 Additional considerations could include posttraumatic change or inflammatory process such as dermatomyositis. IMPRESSION: 1. 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 2. Extensive enthesopathy involving the triceps tendon. 3. Cortical irregularity of the olecranon, possibly representing chronic erosion. Electronically signed by: Dorethia Molt MD 08/22/2024 11:23 PM EST RP Workstation: [id number]   DG Chest Portable 1 View Result Date: 08/22/2024 EXAM: 1 VIEW(S) XRAY OF THE CHEST 08/22/2024  11:14:24 PM COMPARISON: None available. CLINICAL HISTORY: Weakness. FINDINGS: LUNGS AND PLEURA: Elevated left hemidiaphragm. Calcified granuloma in left upper lung. No pleural effusion. No pneumothorax. HEART AND MEDIASTINUM: Aortic calcification. Questionable prominence of ascending aorta. BONES AND SOFT TISSUES: No acute osseous abnormality. IMPRESSION: 1. No acute cardiopulmonary process identified. 2. Questionable prominence of the ascending aorta, consider correlation to exclude aneurysmal dilation. 3. Elevated left hemidiaphragm. 4. Aortic atherosclerosis. Electronically signed by: Dorethia Molt MD 08/22/2024 11:19 PM EST RP Workstation: [id number]   Data Reviewed for HPI: Relevant notes from primary care and specialist visits, past discharge summaries as available in EHR, including Care Everywhere. Prior diagnostic testing as pertinent to current admission diagnoses Updated medications and problem lists for reconciliation ED course, including vitals, labs, imaging,  treatment and response to treatment Triage notes, nursing and pharmacy notes and ED provider's notes Notable results as noted above in HPI      Assessment and Plan: * SIRS (systemic inflammatory response syndrome) (HCC) Lactic acidosis Painful elbow: Gout flare versus septic joint versus olecranon bursitis SIRS criteria include temp of 99.5 with tachycardia of 100, WBC 12 and lactic acid 2.1-->1.0--> 1.5 Lactic acid trending down with fluids only-SIRS physiology also improved IV fluids Will get blood cultures Will get orthopedics consult for possible aspiration--Dr Poggi aware Treat gout flare  Will hold off antibiotics for now We will get a blood culture We will get sed rate and procalcitonin Addendum: Lactic acid trended back up from 1-1.5 so vancomycin  started We will also start IV fluids  Acute gout Pain control Tylenol , .Colchicine   Hold off on steroids pending workup for SIRS   Prostate cancer No acute  issues suspected  Chronic kidney disease, stage 4 (severe) At baseline  Essential hypertension Continue home meds  Dementia due to Alzheimer's disease Delirium precautions Continue home Aricept  and Namenda  and trazodone     DVT prophylaxis: SCD for procedure  Consults: Dr. Edie  Advance Care Planning:   Code Status: Prior   Family Communication: Wife at bedside.  Discussed potential etiologies, possibility that it may or may not be infectious.  After shared decision making she is agreeable with holding off on antibiotics pending orthopedics evaluation in a few hours-given improving labs with fluids only and for better yield.  Disposition Plan: Back to previous home environment  Severity of Illness: The appropriate patient status for this patient is OBSERVATION. Observation status is judged to be reasonable and necessary in order to provide the required intensity of service to ensure the patient's safety. The patient's presenting symptoms, physical exam findings, and initial radiographic and laboratory data in the context of their medical condition is felt to place them at decreased risk for further clinical deterioration. Furthermore, it is anticipated that the patient will be medically stable for discharge from the hospital within 2 midnights of admission.   Author: Delayne LULLA Solian, MD 08/23/2024 12:38 AM  For on call review [email or web address].

## 2024-08-23 NOTE — Consult Note (Signed)
 ORTHOPAEDIC CONSULTATION  REQUESTING PHYSICIAN: [name], MD  Chief Complaint:   Left posterior elbow pain and swelling.  History of Present Illness: Jason Estrada is an 83 y.o. male with multiple medical problems including Alzheimer's dementia, stage IV chronic kidney disease, hyperlipidemia, gout, gastroesophageal reflux disease, hypertension, prostate cancer, primary hyperparathyroidism, and anemia who normally lives independently with his wife.  The patient notes that over the past several days, he began to notice increased pain and swelling in the posterior aspect of his elbow.  Yesterday afternoon, the patient's wife notes that he appeared to become quite weak and was unable to stand by himself.  Furthermore, she was unable to help him get up from the chair into a standing position.  Therefore, the patient was brought to the emergency room and has subsequently been admitted for treatment of presumed sepsis secondary to left olecranon bursitis with possible associated gout.  Orthopedic consultation has been requested as well.  The patient did have a low-grade temperature in the emergency room at 99.5.  His white count was slightly elevated at 12.2 as well as his lactic acid level.  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 This was completed in June 2023, and initial ADT injection was given 12/10/2021.    Sinus tachycardia 09/26/2021   Vitamin B12 deficiency 02/18/2023   229 December 2023   Past Surgical History:  Procedure Laterality Date   CHOLECYSTECTOMY     Social History   Socioeconomic History   Marital status: Married    Spouse name: Not on file   Number of children: Not on file   Years of education: 16   Highest education level: Bachelor's degree (e.g., BA, AB, BS)  Occupational History   Occupation: Retired    Comment: Psychiatrist  Tobacco Use   Smoking status: Never    Passive exposure: Never   Smokeless tobacco: Never  Vaping Use   Vaping status: Never Used  Substance and Sexual Activity   Alcohol use: Yes    Alcohol/week: 7.0 - 14.0 standard drinks of alcohol    Types: 7 - 14 Shots of liquor per week    Comment: 1 mixed drinks nightly   Drug use: Not Currently   Sexual activity: Not Currently  Other Topics Concern   Not on file  Social History Narrative   Right handed   Drinks caffeine   One story home   Married wife with him   Social Drivers of Corporate Investment Banker Strain: Not on file  Food Insecurity: No Food Insecurity (11/07/2022)   Received from [facility]   Hunger Vital Sign    Within the past 12 months, you worried that your food would run out before you got the money to buy more.: Never true    Within the past 12 months, the food you  bought just didn't last and you didn't have money to get more.: Never true  Transportation Needs: No Transportation Needs (11/07/2022)   Received from [facility]   PRAPARE - Transportation    Lack of Transportation (Medical): No    Lack of Transportation (Non-Medical): No  Physical Activity: Not on file  Stress: Not on file  Social Connections: Not on file   History reviewed. No pertinent family history. No Known Allergies Prior to  Admission medications   Medication Sig Start Date End Date Taking? Authorizing Provider  allopurinol  (ZYLOPRIM ) 100 MG tablet Take 1 tablet (100 mg total) by mouth daily. 08/21/23  Yes Cannady, Jolene T, NP  baclofen  (LIORESAL ) 10 MG tablet TAKE 1 TABLET BY MOUTH EVERY DAY AS NEEDED 03/15/24  Yes Cannady, Jolene T, NP  colchicine  0.6 MG tablet TAKE 1 TABLET BY MOUTH EVERY DAY 07/01/24  Yes Cannady, Jolene T, NP  cyanocobalamin (VITAMIN B12) 1000 MCG tablet Take 1,000 mcg by mouth daily.   Yes [provider]  donepezil  (ARICEPT ) 23 MG TABS tablet TAKE 1 TABLET (23 MG TOTAL) BY MOUTH DAILY. 08/06/24  Yes Wertman, Sara E, PA-C  folic acid  (FOLVITE ) 1 MG tablet TAKE 1 TABLET BY MOUTH EVERY DAY 08/18/24  Yes Cannady, Jolene T, NP  hydrALAZINE  (APRESOLINE ) 50 MG tablet Take 0.5 tablets (25 mg total) by mouth in the morning and at bedtime. Patient taking differently: Take 50 mg by mouth daily. 08/21/23  Yes Cannady, Jolene T, NP  memantine  (NAMENDA ) 10 MG tablet Take 1 tab twice a day 08/28/23  Yes Wertman, Sara E, PA-C  pantoprazole  (PROTONIX ) 20 MG tablet Take 1 tablet (20 mg total) by mouth daily. 08/21/23  Yes Cannady, Jolene T, NP  pravastatin  (PRAVACHOL ) 40 MG tablet Take 1 tablet (40 mg total) by mouth daily. 08/21/23  Yes Cannady, Jolene T, NP  traZODone  (DESYREL ) 50 MG tablet TAKE HALF TABLET AT NIGHT, MAY INCREASE TO ONE FULL TABLET IF NEEDED 03/23/24  Yes Wertman, Sara E, PA-C  verapamil  (CALAN -SR) 240 MG CR tablet Take 1 tablet (240 mg total) by mouth daily. 08/21/23  Yes Valerio Moris T, NP   CT Head Wo Contrast Result Date: 08/22/2024 EXAM: CT HEAD WITHOUT CONTRAST 08/22/2024 11:24:29 PM TECHNIQUE: CT of the head was performed without the administration of intravenous contrast. Automated exposure control, iterative reconstruction, and/or weight based adjustment of the mA/kV was utilized to reduce the radiation dose to as low as reasonably achievable. COMPARISON: MRI 06/20/2021. CLINICAL  HISTORY: Mental status change, unknown cause. FINDINGS: BRAIN AND VENTRICLES: No acute hemorrhage. No evidence of acute infarct. No hydrocephalus. No extra-axial collection. No mass effect or midline shift. Atrophy and chronic small vessel disease throughout the deep white matter. ORBITS: No acute abnormality. SINUSES: No acute abnormality. SOFT TISSUES AND SKULL: No acute soft tissue abnormality. No skull fracture. IMPRESSION: 1. No acute intracranial abnormality. 2. Atrophy and chronic small vessel disease throughout the deep white matter. Electronically signed by: Franky Crease MD 08/22/2024 11:27 PM EST RP Workstation: [id number]   DG Elbow Complete Left Result Date: 08/22/2024 EXAM: 3 VIEW(S) XRAY OF THE LEFT ELBOW COMPARISON: None available. CLINICAL HISTORY: left elbow swelling FINDINGS: BONES AND JOINTS: Cortical irregularity of the olecranon, possible representing chronic erosion. Extensive enthesopathy involving the triceps tendon. No significant joint space narrowing noted. No definite effusion. No acute fracture or dislocation. 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 Additional considerations could include posttraumatic change or inflammatory process such as dermatomyositis. IMPRESSION: 1. 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 2. Extensive enthesopathy involving the triceps tendon. 3. Cortical irregularity of the olecranon, possibly representing chronic erosion. Electronically signed by: Dorethia Molt MD 08/22/2024 11:23 PM EST RP Workstation: [id number]   DG Chest  Portable 1 View Result Date: 08/22/2024 EXAM: 1 VIEW(S) XRAY OF THE CHEST 08/22/2024 11:14:24 PM COMPARISON: None available. CLINICAL HISTORY: Weakness. FINDINGS: LUNGS AND PLEURA: Elevated left hemidiaphragm. Calcified granuloma in left upper lung. No pleural effusion. No pneumothorax. HEART AND MEDIASTINUM: Aortic calcification. Questionable prominence of ascending aorta. BONES AND SOFT TISSUES: No acute osseous abnormality. IMPRESSION: 1. No acute cardiopulmonary process identified. 2. Questionable prominence of the ascending aorta, consider correlation to exclude aneurysmal dilation. 3. Elevated left hemidiaphragm. 4. Aortic atherosclerosis. Electronically signed by: Dorethia Molt MD 08/22/2024 11:19 PM EST RP Workstation: [id number]    Positive ROS: All other systems have been reviewed and were otherwise negative with the exception of those mentioned in the HPI and as above.  Physical Exam: General:  Alert, no acute distress Psychiatric:  Patient is of questionable competence for consent, but exhibits normal mood and affect   Cardiovascular:  No pedal edema Respiratory:  No wheezing, non-labored breathing GI:  Abdomen is soft and non-tender Skin:  No lesions in the area of chief complaint Neurologic:  Sensation intact distally Lymphatic:  No axillary or cervical lymphadenopathy  Orthopedic Exam:  Orthopedic examination is limited to the left elbow and upper extremity.  There is moderate swelling and erythema over the posterior aspect of the elbow, consistent with a septic left olecranon bursitis.  It is difficult to determine if there is any clear fluid collections subcutaneously.  He has moderate tenderness to palpation over this area.  Actively, he is able to range his elbow from 10 to 50 degrees without too much discomfort.  There does not appear to be any significant elbow effusion.  No ecchymosis, abrasions, or other skin defects are noted around the posterior aspect of the elbow.  He is  grossly neurovascularly intact to the left upper extremity and hand.  X-rays:  AP, lateral, and oblique views of the left elbow are available for review and have been reviewed by myself.  The findings are as described above.  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  There is no obvious elbow effusion or significant degenerative changes to the elbow.  No acute bony processes are identified.  Assessment: Probable septic left olecranon bursitis.  Plan: The treatment options have been discussed with the patient and his wife who is at the bedside.  The wife requests that we do everything possible to avoid taking him to the operating room so as to avoid general anesthesia given his history of Alzheimer's.  Therefore, I think it is reasonable to see how he responds to IV antibiotics over the next 24 to 48 hours before considering formal irrigation and debridement of the left olecranon bursa.    After obtaining verbal consent, an attempt was made to aspirate the elbow, but only a small amount of bloody fluid could be  obtained.  This fluid was sent off for culture and sensitivity.  The patient tolerated the procedure well.  Thank you for asked me to participate in the care of this most pleasant unfortunate man.  I will follow him with you.  As precaution, I will make him n.p.o. after midnight tonight in case we need to proceed with surgical intervention tomorrow.   Jason Reyes Maltos, MD  Beeper #:  [phone]  08/23/2024 8:17 AM

## 2024-08-23 NOTE — Assessment & Plan Note (Addendum)
 Pain control Tylenol , .Colchicine   Hold off on steroids pending workup for SIRS

## 2024-08-23 NOTE — Consult Note (Signed)
 Pharmacy Antibiotic Note  Jason Estrada is a 83 y.o. male admitted on 08/22/2024 with septic bursitis.  Pharmacy has been consulted for vancomycin  dosing.afeb, procal 0.27, lactic acid 2.1 > 1.5, WBC 12.2. Scr 1.9 (seems to be baseline)  Plan: Continue ceftriaxone  2 g daily   Will give vancomycin  loading dose of 1750 mg x 1. Will start vancomycin  1000 mg q24H maintenance dose. Predicted AUC of 506, goal AUC of 400-550. Scr 1.9, Vd 0.72, IBW. Plan to order vancomycin  levels after 4th or 5th dose.   Height: 6' (182.9 cm) Weight: 85.3 kg (188 lb 0.8 oz) IBW/kg (Calculated) : 77.6  Temp (24hrs), Avg:98.7 F (37.1 C), Min:98 F (36.7 C), Max:99.5 F (37.5 C)  Recent Labs  Lab 08/22/24 1808 08/23/24 0144 08/23/24 0509  WBC 12.2*  --   --   CREATININE 1.90*  --   --   LATICACIDVEN 2.1* 1.0 1.5    Estimated Creatinine Clearance: 33.5 mL/min (A) (by C-G formula based on SCr of 1.9 mg/dL (H)).    No Known Allergies   Microbiology results: 12/8 BCx: pending 12/8 Body fluid culture: pending.   Thank you for allowing pharmacy to be a part of this patient's care.  Cathaleen GORMAN Blanch, PharmD, BCPS 08/23/2024 10:00 AM

## 2024-08-23 NOTE — Consult Note (Signed)
 WOC Nurse Consult Note: Reason for Consult: sacral wound  Wound type: 1.  Stage 2 pressure injury sacrum pink moist  2.  Moisture Associated Skin Damage extending from sacrum down to B medial buttocks erythema with scattered partial thickness skin loss Wound bed: as above  Drainage (amount, consistency, odor) see nursing flowsheet  Periwound: erythema, patient does have dark discoloration to B buttocks indicating possible chronic tissue damage  Dressing procedure/placement/frequency: Cleanse sacrum and B medial buttocks with soap and water, dry and apply [name] 3 times daily and as needed for soiling.   POC discussed with bedside nurse. Appreciate S. Sandralee, RN assistance with this consult.   WOC team will not follow. Reconsult if further needs arise.   Thank you,    Powell Bar MSN, RN-BC, [employer]

## 2024-08-23 NOTE — Assessment & Plan Note (Signed)
 At baseline

## 2024-08-23 NOTE — Assessment & Plan Note (Signed)
 No acute issues suspected

## 2024-08-23 NOTE — Assessment & Plan Note (Signed)
-   Continue home meds

## 2024-08-23 NOTE — Assessment & Plan Note (Signed)
 Delirium precautions Continue home Aricept  and Namenda  and trazodone 

## 2024-08-23 NOTE — Progress Notes (Addendum)
 PROGRESS NOTE    Jason Estrada  FMW:[id number] DOB: April 21, 1941 DOA: 08/22/2024 PCP: [name], NP   Brief Narrative:   Jason Estrada is a 83 y.o. male with medical history significant for CKD 4, hypertension, gout, dementia being admitted with SIRS secondary to a gout flare left elbow versus SIRS/sepsis from olecranon bursitis/septic joint.  He was brought in by family with generalized weakness.  His wife noticed that he was having difficulty getting up from his lift chair and stated when she tried to help him up he seemed to wince when she held his left elbow.  After arriving in the ED, she noticed the elbow appeared red and inflamed as usually he wears long sleeves.  She states the same thing happened when he was admitted for flareup of gout of his left wrist a few years prior.  He On arrival he had a low-grade temp of 99.5 and tachycardic to the 100 with otherwise unremarkable vitals.  Labs notable for leukocytosis of 12,200 with a lactic acid of 2.1->1.0.  Urinalysis sterile and respiratory viral panel negative for COVID flu and RSV.  Other labs for the most part near baseline with creatinine 1.9 EKG showed sinus tachycardia at 104 Chest x-ray nonacute.  X-ray of the left elbow showed gouty tophi CT head nonacute     Assessment & Plan:   Principal Problem:   SIRS (systemic inflammatory response syndrome) (HCC) Active Problems:   Acute gout   Dementia due to Alzheimer's disease   Essential hypertension   Chronic kidney disease, stage 4 (severe)   Prostate cancer   * SIRS (systemic inflammatory response syndrome) (HCC) Lactic acidosis Painful elbow: Gout flare versus septic joint versus olecranon bursitis SIRS criteria include temp of 99.5 with tachycardia of 100, WBC 12 and lactic acid 2.1-->1.0 Evaluated by orthopedics, aspiration of bursa with small amount of fluid today by orthopedics, sent for culture.  Discussed with Ortho, will initiate IV antibiotics.  IV Rocephin  and  vancomycin  ordered.  Will follow cultures Continue colchicine  for now Will hold off antibiotics for now We will get a blood culture We will get sed rate and procalcitonin   Acute gout Gout flare versus septic bursitis Continue colchicine , Norco as needed for pain  Generalized weakness: Will need PT/OT evaluation Patient may need SNF    Prostate cancer No acute issues suspected   Chronic kidney disease, stage 4 (severe) At baseline   Essential hypertension Continue home meds   Dementia due to Alzheimer's disease Delirium precautions Continue home Aricept  and Namenda  and trazodone   sacral pressure injury; Stage I , POA, will consult wound care       DVT prophylaxis: SCD for procedure   Consults: Dr. Edie   Advance Care Planning:   Code Status: Prior    Family Communication: Wife at bedside.     Disposition Plan: Back to previous home environment with home health versus SNF   Severity of Illness: The appropriate patient status for this patient is OBSERVATION. Observation status is judged to be reasonable and necessary in order to provide the required intensity of service to ensure the patient's safety. The patient's presenting symptoms, physical exam findings, and initial radiographic and laboratory data in the context of their medical condition is felt to place them at decreased risk for further clinical deterioration. Furthermore, it is anticipated that the patient will be medically stable for discharge from the hospital within 2 midnights of admission.     Subjective:  Patient seen and examined at  the bedside earlier today.  He continues to have pain/tenderness with continued erythema on the left elbow.  No fever or chills today.  Vital signs are stable.  He feels very weak.  Objective: Vitals:   08/23/24 1130 08/23/24 1137 08/23/24 1200 08/23/24 1230  BP: (!) 151/80 (!) 151/80 139/76 (!) 152/82  Pulse:  79    Resp: (!) 22 18 (!) 22 18  Temp:  97.8 F (36.6  C)    TempSrc:  Oral    SpO2:  99%    Weight:      Height:        Intake/Output Summary (Last 24 hours) at 08/23/2024 1535 Last data filed at 08/23/2024 0920 Gross per 24 hour  Intake --  Output 150 ml  Net -150 ml   Filed Weights   08/22/24 1806  Weight: 85.3 kg    Examination:  Physical Exam Vitals and nursing note reviewed.  Constitutional:      General: He is not in acute distress. HENT:     Head: Normocephalic and atraumatic.  Cardiovascular:     Rate and Rhythm: Normal rate and regular rhythm.     Heart sounds: Normal heart sounds.  Pulmonary:     Effort: Pulmonary effort is normal.     Breath sounds: Normal breath sounds.  Abdominal:     Palpations: Abdomen is soft.     Tenderness: There is no abdominal tenderness.  Musculoskeletal:     Left elbow: Swelling and effusion present. Decreased range of motion. Tenderness present in olecranon process.     Comments: See photo below  Neurological:     Mental Status: Mental status is at baseline.     Data Reviewed: I have personally reviewed following labs and imaging studies  CBC: Recent Labs  Lab 08/22/24 1808  WBC 12.2*  HGB 12.0*  HCT 37.4*  MCV 97.9  PLT 249   Basic Metabolic Panel: Recent Labs  Lab 08/22/24 1808  NA 141  K 4.1  CL 106  CO2 21*  GLUCOSE 135*  BUN 25*  CREATININE 1.90*  CALCIUM 9.1   GFR: Estimated Creatinine Clearance: 33.5 mL/min (A) (by C-G formula based on SCr of 1.9 mg/dL (H)). Liver Function Tests: Recent Labs  Lab 08/22/24 1808  AST 19  ALT 6  ALKPHOS 95  BILITOT 0.6  PROT 6.7  ALBUMIN 3.7   No results for input(s): LIPASE, AMYLASE in the last 168 hours. No results for input(s): AMMONIA in the last 168 hours. Coagulation Profile: No results for input(s): INR, PROTIME in the last 168 hours. Cardiac Enzymes: No results for input(s): CKTOTAL, CKMB, CKMBINDEX, TROPONINI in the last 168 hours. BNP (last 3 results) No results for input(s):  PROBNP in the last 8760 hours. HbA1C: No results for input(s): HGBA1C in the last 72 hours. CBG: No results for input(s): GLUCAP in the last 168 hours. Lipid Profile: No results for input(s): CHOL, HDL, LDLCALC, TRIG, CHOLHDL, LDLDIRECT in the last 72 hours. Thyroid Function Tests: No results for input(s): TSH, T4TOTAL, FREET4, T3FREE, THYROIDAB in the last 72 hours. Anemia Panel: No results for input(s): VITAMINB12, FOLATE, FERRITIN, TIBC, IRON, RETICCTPCT in the last 72 hours. Sepsis Labs: Recent Labs  Lab 08/22/24 1808 08/23/24 0144 08/23/24 0509 08/23/24 0512  PROCALCITON  --   --   --  0.27  LATICACIDVEN 2.1* 1.0 1.5  --     Recent Results (from the past 240 hours)  Resp panel by RT-PCR (RSV, Flu A&B, Covid) Anterior Nasal  Swab     Status: None   Collection Time: 08/22/24  9:01 PM   Specimen: Anterior Nasal Swab  Result Value Ref Range Status   SARS Coronavirus 2 by RT PCR NEGATIVE NEGATIVE Final   Influenza A by PCR NEGATIVE NEGATIVE Final   Influenza B by PCR NEGATIVE NEGATIVE Final   Resp Syncytial Virus by PCR NEGATIVE NEGATIVE Final    Comment: Performed at [facility], [address] [place], [place] [address]  Culture, blood (Routine X 2) w Reflex to ID Panel     Status: None (Preliminary result)   Collection Time: 08/23/24  1:44 AM   Specimen: BLOOD  Result Value Ref Range Status   Specimen Description BLOOD BLOOD RIGHT HAND  Final   Special Requests   Final    BOTTLES DRAWN AEROBIC AND ANAEROBIC Blood Culture results may not be optimal due to an inadequate volume of blood received in culture bottles   Culture   Final    NO GROWTH < 12 HOURS Performed at [facility], [address] [place], [place] [address]    Report Status PENDING  Incomplete  Culture, blood (Routine X 2) w Reflex to ID Panel     Status: None (Preliminary result)   Collection Time: 08/23/24  1:44 AM   Specimen: BLOOD  Result  Value Ref Range Status   Specimen Description BLOOD BLOOD LEFT ARM  Final   Special Requests   Final    BOTTLES DRAWN AEROBIC AND ANAEROBIC Blood Culture results may not be optimal due to an inadequate volume of blood received in culture bottles   Culture   Final    NO GROWTH < 12 HOURS Performed at [facility], [address] [place], [place] [address]    Report Status PENDING  Incomplete  Body fluid culture w Gram Stain     Status: None (Preliminary result)   Collection Time: 08/23/24  7:52 AM   Specimen: Synovium; Body Fluid  Result Value Ref Range Status   Specimen Description   Final    SYNOVIAL Performed at [facility], [address], [place], [place] [address]    Special Requests   Final    BURSA Performed at [facility], [address] [place], [place] [address]    Gram Stain   Final    NO WBC SEEN NO ORGANISMS SEEN Performed at [facility], [address] [place], [place] [address]    Culture PENDING  Incomplete   Report Status PENDING  Incomplete         Radiology Studies: CT Head Wo Contrast Result Date: 08/22/2024 EXAM: CT HEAD WITHOUT CONTRAST 08/22/2024 11:24:29 PM TECHNIQUE: CT of the head was performed without the administration of intravenous contrast. Automated exposure control, iterative reconstruction, and/or weight based adjustment of the mA/kV was utilized to reduce the radiation dose to as low as reasonably achievable. COMPARISON: MRI 06/20/2021. CLINICAL HISTORY: Mental status change, unknown cause. FINDINGS: BRAIN AND VENTRICLES: No acute hemorrhage. No evidence of acute infarct. No hydrocephalus. No extra-axial collection. No mass effect or midline shift. Atrophy and chronic small vessel disease throughout the deep white matter. ORBITS: No acute abnormality. SINUSES: No acute abnormality. SOFT TISSUES AND SKULL: No acute soft tissue abnormality. No skull fracture. IMPRESSION: 1. No acute intracranial  abnormality. 2. Atrophy and chronic small vessel disease throughout the deep white matter. Electronically signed by: Franky Crease MD 08/22/2024 11:27 PM EST RP Workstation: [id number]   DG Elbow  Complete Left Result Date: 08/22/2024 EXAM: 3 VIEW(S) XRAY OF THE LEFT ELBOW COMPARISON: None available. CLINICAL HISTORY: left elbow swelling FINDINGS: BONES AND JOINTS: Cortical irregularity of the olecranon, possible representing chronic erosion. Extensive enthesopathy involving the triceps tendon. No significant joint space narrowing noted. No definite effusion. No acute fracture or dislocation. 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 Additional considerations could include posttraumatic change or inflammatory process such as dermatomyositis. IMPRESSION: 1. 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 2. Extensive enthesopathy involving the triceps tendon. 3. Cortical irregularity of the olecranon, possibly representing chronic erosion. Electronically signed by: Dorethia Molt MD 08/22/2024 11:23 PM EST RP Workstation: [id number]   DG Chest Portable 1 View Result Date: 08/22/2024 EXAM: 1 VIEW(S) XRAY OF THE CHEST 08/22/2024 11:14:24 PM COMPARISON: None available. CLINICAL HISTORY: Weakness. FINDINGS: LUNGS AND PLEURA: Elevated left hemidiaphragm. Calcified granuloma in left upper lung. No pleural effusion. No pneumothorax. HEART AND MEDIASTINUM: Aortic calcification. Questionable prominence of ascending aorta. BONES AND SOFT TISSUES: No acute osseous abnormality. IMPRESSION: 1. No acute  cardiopulmonary process identified. 2. Questionable prominence of the ascending aorta, consider correlation to exclude aneurysmal dilation. 3. Elevated left hemidiaphragm. 4. Aortic atherosclerosis. Electronically signed by: Dorethia Molt MD 08/22/2024 11:19 PM EST RP Workstation: [id number]        Scheduled Meds:  colchicine   0.6 mg Oral Daily   donepezil   23 mg Oral Daily   enoxaparin  (LOVENOX ) injection  40 mg Subcutaneous Q24H   hydrALAZINE   25 mg Oral Q8H   memantine   10 mg Oral BID   pantoprazole   20 mg Oral Daily   pravastatin   40 mg Oral Daily   traZODone   25 mg Oral QHS   Continuous Infusions:  cefTRIAXone  (ROCEPHIN )  IV Stopped (08/23/24 1048)   lactated ringers  Stopped (08/23/24 0949)   lactated ringers  150 mL/hr (08/23/24 0949)   [START ON 08/24/2024] vancomycin             Derryl Duval, MD [facility] 08/23/2024, 3:35 PM

## 2024-08-23 NOTE — Assessment & Plan Note (Addendum)
 Lactic acidosis Painful elbow: Gout flare versus septic joint versus olecranon bursitis SIRS criteria include temp of 99.5 with tachycardia of 100, WBC 12 and lactic acid 2.1-->1.0--> 1.5 Lactic acid trending down with fluids only-SIRS physiology also improved IV fluids Will get blood cultures Will get orthopedics consult for possible aspiration--Dr Poggi aware Treat gout flare  Will hold off antibiotics for now We will get a blood culture We will get sed rate and procalcitonin Addendum: Lactic acid trended back up from 1-1.5 so vancomycin  started We will also start IV fluids

## 2024-08-24 LAB — CBC WITH DIFFERENTIAL/PLATELET
Abs Immature Granulocytes: 0.03 K/uL (ref 0.00–0.07)
Basophils Absolute: 0 K/uL (ref 0.0–0.1)
Basophils Relative: 0 %
Eosinophils Absolute: 0 K/uL (ref 0.0–0.5)
Eosinophils Relative: 0 %
HCT: 32.7 % — ABNORMAL LOW (ref 39.0–52.0)
Hemoglobin: 10.4 g/dL — ABNORMAL LOW (ref 13.0–17.0)
Immature Granulocytes: 0 %
Lymphocytes Relative: 8 %
Lymphs Abs: 0.6 K/uL — ABNORMAL LOW (ref 0.7–4.0)
MCH: 30.9 pg (ref 26.0–34.0)
MCHC: 31.8 g/dL (ref 30.0–36.0)
MCV: 97 fL (ref 80.0–100.0)
Monocytes Absolute: 0.7 K/uL (ref 0.1–1.0)
Monocytes Relative: 10 %
Neutro Abs: 6.2 K/uL (ref 1.7–7.7)
Neutrophils Relative %: 82 %
Platelets: 167 K/uL (ref 150–400)
RBC: 3.37 MIL/uL — ABNORMAL LOW (ref 4.22–5.81)
RDW: 12.9 % (ref 11.5–15.5)
WBC: 7.6 K/uL (ref 4.0–10.5)
nRBC: 0 % (ref 0.0–0.2)

## 2024-08-24 LAB — BASIC METABOLIC PANEL WITH GFR
Anion gap: 12 (ref 5–15)
BUN: 25 mg/dL — ABNORMAL HIGH (ref 8–23)
CO2: 23 mmol/L (ref 22–32)
Calcium: 8.9 mg/dL (ref 8.9–10.3)
Chloride: 106 mmol/L (ref 98–111)
Creatinine, Ser: 1.73 mg/dL — ABNORMAL HIGH (ref 0.61–1.24)
GFR, Estimated: 39 mL/min — ABNORMAL LOW (ref >60)
Glucose, Bld: 100 mg/dL — ABNORMAL HIGH (ref 70–99)
Potassium: 4.3 mmol/L (ref 3.5–5.1)
Sodium: 141 mmol/L (ref 135–145)

## 2024-08-24 MED ORDER — PHENYLEPHRINE HCL-NACL 20-0.9 MG/250ML-% IV SOLN
INTRAVENOUS | Status: AC
  Filled 2024-08-24: qty 250

## 2024-08-24 NOTE — Progress Notes (Signed)
 Patient ID: Jason Estrada, male   DOB: 12-20-1940, 83 y.o.   MRN: [id number]  Subjective: The patient notes moderate improvement in his symptoms of pain and swelling in the left elbow.  He has no new complaints pertaining to the left elbow or upper extremity.   Objective: Vital signs in last 24 hours: Temp:  [97.8 F (36.6 C)-98.3 F (36.8 C)] 98.2 F (36.8 C) (12/09 0317) Pulse Rate:  [72-86] 72 (12/09 0317) Resp:  [17-27] 20 (12/09 0317) BP: (118-181)/(62-93) 118/63 (12/09 0317) SpO2:  [97 %-100 %] 97 % (12/09 0317)  Intake/Output from previous day: 12/08 0701 - 12/09 0700 In: 1164.4 [I.V.:1081.1; IV Piggyback:83.3] Out: 700 [Urine:700] Intake/Output this shift: No intake/output data recorded.  Recent Labs    08/22/24 1808 08/24/24 0520  HGB 12.0* 10.4*   Recent Labs    08/22/24 1808 08/24/24 0520  WBC 12.2* 7.6  RBC 3.82* 3.37*  HCT 37.4* 32.7*  PLT 249 167   Recent Labs    08/22/24 1808 08/24/24 0520  NA 141 141  K 4.1 4.3  CL 106 106  CO2 21* 23  BUN 25* 25*  CREATININE 1.90* 1.73*  GLUCOSE 135* 100*  CALCIUM 9.1 8.9   No results for input(s): LABPT, INR in the last 72 hours.  Physical Exam: Orthopedic examination again is limited to the left elbow and upper extremity.  The erythema and swelling appears to be markedly improved this morning as compared to yesterday.  No clear loculated fluid collections are identified.  There are several areas of firm subcutaneous tissues/masses which appear to be chronic as similar findings are noted in the posterior aspect of the right elbow.  He is moving his elbow more freely today and has less tenderness to palpation over the posterior aspect of the elbow.  He remains neurovascularly intact to the left upper extremity and hand.  Assessment: Septic left olecranon bursitis, improving symptomatically.  Plan: The treatment options are reviewed with the patient.  Given that the symptoms appear to be improving with IV  antibiotics, I feel that we may continue this plan at this time.  Therefore, I will hold off on any planned surgery for today and allow the patient to eat.  I will make him n.p.o. again after midnight and reevaluate him first thing in the morning to see whether he continues to improve or whether I&D becomes indicated.  He may be mobilized with physical therapy today as tolerated.   Zyriah Mask J Jaquelin Meaney 08/24/2024, 7:51 AM

## 2024-08-24 NOTE — Evaluation (Signed)
 Occupational Therapy Evaluation Patient Details Name: Barrie Wale MRN: [id number] DOB: 27-Mar-1941 Today's Date: 08/24/2024   History of Present Illness   Trevis Eden is a 83 y.o. male with medical history significant for CKD 4, hypertension, gout, dementia being admitted with SIRS secondary to a gout flare left elbow versus SIRS/sepsis from olecranon bursitis/septic joint.  He was brought in by family with generalized weakness.  His wife noticed that he was having difficulty getting up from his lift chair and stated when she tried to help him up he seemed to wince when she held his left elbow.  After arriving in the ED, she noticed the elbow appeared red and inflamed as usually he wears long sleeves.  She states the same thing happened when he was admitted for flareup of gout of his left wrist a few years prior.    Clinical Impressions Pt was seen for OT evaluation this date. Prior to hospital admission, pt was living with his spouse and pt reports being indep with most ADL/IADL. Spouse not present to verify. Pt demos some difficulty with memory and recall. Pt presents with deficits in strength, cognition, L elbow ROM and edema, pt appears to have discomfort when moving LUE 2/2 elbow, affecting safe and optimal ADL completion. Pt currently requires MIN-MOD A for ADL transfers and MOD A for LB ADL tasks. Pt would benefit from skilled OT services to address noted impairments and functional limitations (see below for any additional details) in order to maximize safety and independence while minimizing future risk of falls, injury, and readmission. Anticipate the need for follow up OT services upon acute hospital DC.    If plan is discharge home, recommend the following:   A lot of help with bathing/dressing/bathroom;A lot of help with walking and/or transfers;Direct supervision/assist for medications management;Supervision due to cognitive status;Direct supervision/assist for financial  management;Assist for transportation;Assistance with cooking/housework;Help with stairs or ramp for entrance     Functional Status Assessment   Patient has had a recent decline in their functional status and demonstrates the ability to make significant improvements in function in a reasonable and predictable amount of time.     Equipment Recommendations   Other (comment) (defer)     Recommendations for Other Services         Precautions/Restrictions   Precautions Precautions: Fall Recall of Precautions/Restrictions: Impaired Restrictions Weight Bearing Restrictions Per Provider Order: No     Mobility Bed Mobility               General bed mobility comments: NT, in recliner    Transfers                   General transfer comment: pt declined          ADL either performed or assessed with clinical judgement   ADL Overall ADL's : Needs assistance/impaired        General ADL Comments: Pt currently requires MOD A for LB ADL tasks, anticipate MIN-MOD A for ADL transfers and mobility with AD.      Pertinent Vitals/Pain Pain Assessment Pain Assessment: No/denies pain     Extremity/Trunk Assessment Upper Extremity Assessment Upper Extremity Assessment: Generalized weakness;LUE deficits/detail LUE Deficits / Details: limited elbow AROM/PROM limited 10 degress for full extension; MMT not tested, edema noted LUE Coordination: decreased gross motor   Lower Extremity Assessment Lower Extremity Assessment: Generalized weakness;Defer to PT evaluation   Cervical / Trunk Assessment Cervical / Trunk Assessment: Normal   Communication Communication Communication: No  apparent difficulties   Cognition Arousal: Alert Behavior During Therapy: WFL for tasks assessed/performed Cognition: No family/caregiver present to determine baseline                               Following commands: Impaired Following commands impaired: Follows one  step commands with increased time     Cueing  General Comments   Cueing Techniques: Verbal cues      Exercises     Shoulder Instructions      Home Living Family/patient expects to be discharged to:: Private residence Living Arrangements: Alone Available Help at Discharge: Family Type of Home: House Home Access: Stairs to enter [employer] of Steps: 1 Entrance Stairs-Rails: None Home Layout: One level               Home Equipment: Cane - single point;Standard Environmental Consultant;Shower seat - built in   Additional Comments: normally use cane      Prior Functioning/Environment Prior Level of Function : Independent/Modified Independent             Mobility Comments: independent with cane mostly; wife has to call ambulance to lift pt. from the toilet often due to weakness ADLs Comments: some assistance with bathing to make sure he doesn't fall; independent for other ADLs    OT Problem List: Decreased strength;Decreased range of motion;Increased edema;Decreased activity tolerance;Decreased safety awareness;Impaired balance (sitting and/or standing);Decreased knowledge of use of DME or AE;Impaired UE functional use   OT Treatment/Interventions: Therapeutic exercise;Self-care/ADL training;Therapeutic activities;Cognitive remediation/compensation;DME and/or AE instruction;Patient/family education;Balance training      OT Goals(Current goals can be found in the care plan section)   Acute Rehab OT Goals Patient Stated Goal: get better OT Goal Formulation: With patient Time For Goal Achievement: 09/07/24 Potential to Achieve Goals: Good ADL Goals Pt Will Perform Upper Body Dressing: sitting;with caregiver independent in assisting Pt Will Perform Lower Body Dressing: sit to/from stand;with caregiver independent in assisting Pt Will Transfer to Toilet: ambulating (LRAD, caregiver indep in assisting) Pt Will Perform Toileting - Clothing Manipulation and hygiene: with  caregiver independent in assisting;sit to/from stand;sitting/lateral leans   OT Frequency:  Min 2X/week       AM-PAC OT 6 Clicks Daily Activity     Outcome Measure Help from another person eating meals?: None Help from another person taking care of personal grooming?: A Little Help from another person toileting, which includes using toliet, bedpan, or urinal?: A Lot Help from another person bathing (including washing, rinsing, drying)?: A Lot Help from another person to put on and taking off regular upper body clothing?: A Little Help from another person to put on and taking off regular lower body clothing?: A Lot 6 Click Score: 16   End of Session    Activity Tolerance: Patient tolerated treatment well Patient left: in chair;with call bell/phone within reach;with chair alarm set  OT Visit Diagnosis: Other abnormalities of gait and mobility (R26.89);Muscle weakness (generalized) (M62.81)                Time: 8894-8884 OT Time Calculation (min): 10 min Charges:  OT General Charges $OT Visit: 1 Visit OT Evaluation $OT Eval Low Complexity: 1 Low  Warren SAUNDERS., MPH, MS, OTR/L ascom [phone] 08/24/24, 1:56 PM

## 2024-08-24 NOTE — Progress Notes (Addendum)
 PROGRESS NOTE    Jason Estrada  FMW:[id number] DOB: March 18, 1941 DOA: 08/22/2024 PCP: [name], NP   Brief Narrative:   Jason Estrada is a 83 y.o. male with medical history significant for CKD 4, hypertension, gout, dementia being admitted with SIRS secondary to a gout flare left elbow versus SIRS/sepsis from olecranon bursitis/septic joint.  He was brought in by family with generalized weakness.  His wife noticed that he was having difficulty getting up from his lift chair and stated when she tried to help him up he seemed to wince when she held his left elbow.  After arriving in the ED, she noticed the elbow appeared red and inflamed as usually he wears long sleeves.  She states the same thing happened when he was admitted for flareup of gout of his left wrist a few years prior.  He On arrival he had a low-grade temp of 99.5 and tachycardic to the 100 with otherwise unremarkable vitals.  Labs notable for leukocytosis of 12,200 with a lactic acid of 2.1->1.0.  Urinalysis sterile and respiratory viral panel negative for COVID flu and RSV.  Other labs for the most part near baseline with creatinine 1.9 EKG showed sinus tachycardia at 104 Chest x-ray nonacute.  X-ray of the left elbow showed gouty tophi CT head nonacute    Patient is being treated with IV antibiotics for septic left elbow olecrannon bursitis.  Ortho is following.    Further hospital course and management as outlined below.    Assessment & Plan:   Principal Problem:   SIRS (systemic inflammatory response syndrome) (HCC) Active Problems:   Acute gout   Dementia due to Alzheimer's disease   Essential hypertension   Chronic kidney disease, stage 4 (severe)   Prostate cancer   * SIRS (systemic inflammatory response syndrome) (HCC) Lactic acidosis Painful elbow: Gout flare versus septic joint versus olecranon bursitis SIRS criteria include temp of 99.5 with tachycardia of 100, WBC 12 and lactic acid  2.1-->1.0 Evaluated by orthopedics, aspiration of bursa with small amount of fluid by orthopedics, sent for culture.   --Discussed with Ortho, will initiate IV antibiotics --Continue empiric IV Vancomycin  & Rocephin  --Follow aspirate & blood cultures   Acute gout Gout flare versus septic bursitis Continue colchicine , Norco as needed for pain  Generalized weakness: Will need PT/OT evaluation Patient may need SNF    Prostate cancer No acute issues suspected   Chronic kidney disease, stage 4 (severe) At baseline --Monitor BMP   Essential hypertension --Continue home meds   Dementia due to Alzheimer's disease Delirium precautions --Continue home Aricept  and Namenda  and trazodone   sacral pressure injury; Stage I , POA, will consult wound care       DVT prophylaxis: SCD for procedure   Consults: Dr. Edie   Advance Care Planning:   Code Status: Prior    Family Communication: Wife at bedside.     Disposition Plan: Back to previous home environment with home health versus SNF      Subjective:  Patient up in recliner, spouse at bedside this AM.  Pt denies complaints.  Denies pain in his elbow.  Wife reports therapy came earlier, pt was able to stand with walker, night of admission he was too weak for his legs to hold him up.     Objective: Vitals:   08/23/24 1656 08/23/24 1928 08/24/24 0317 08/24/24 0804  BP: (!) 181/85 (!) 178/70 118/63 (!) 163/73  Pulse: 86 86 72 79  Resp: 17 18 20  17  Temp: 98.3 F (36.8 C) 98 F (36.7 C) 98.2 F (36.8 C) 97.7 F (36.5 C)  TempSrc:      SpO2: 99% 98% 97% 100%  Weight:      Height:        Intake/Output Summary (Last 24 hours) at 08/24/2024 1308 Last data filed at 08/24/2024 9662 Gross per 24 hour  Intake 1081.09 ml  Output 550 ml  Net 531.09 ml   Filed Weights   08/22/24 1806  Weight: 85.3 kg    Examination:  General exam: awake, alert, no acute distress HEENT: moist mucus membranes, hearing grossly normal   Respiratory system: CTAB, no wheezes, rales or rhonchi, normal respiratory effort. Cardiovascular system: normal S1/S2,  RRR Gastrointestinal system: soft, NT, ND Central nervous system: A&O x 2+. no gross focal neurologic deficits, normal speech Extremities: R elbow swelling with erythema that has no differential warmth on palpation Skin: dry, intact, normal temperature Psychiatry: normal mood, congruent affect   Data Reviewed: I have personally reviewed following labs and imaging studies  CBC: Recent Labs  Lab 08/22/24 1808 08/24/24 0520  WBC 12.2* 7.6  NEUTROABS  --  6.2  HGB 12.0* 10.4*  HCT 37.4* 32.7*  MCV 97.9 97.0  PLT 249 167   Basic Metabolic Panel: Recent Labs  Lab 08/22/24 1808 08/24/24 0520  NA 141 141  K 4.1 4.3  CL 106 106  CO2 21* 23  GLUCOSE 135* 100*  BUN 25* 25*  CREATININE 1.90* 1.73*  CALCIUM 9.1 8.9   GFR: Estimated Creatinine Clearance: 36.8 mL/min (A) (by C-G formula based on SCr of 1.73 mg/dL (H)). Liver Function Tests: Recent Labs  Lab 08/22/24 1808  AST 19  ALT 6  ALKPHOS 95  BILITOT 0.6  PROT 6.7  ALBUMIN 3.7   No results for input(s): LIPASE, AMYLASE in the last 168 hours. No results for input(s): AMMONIA in the last 168 hours. Coagulation Profile: No results for input(s): INR, PROTIME in the last 168 hours. Cardiac Enzymes: No results for input(s): CKTOTAL, CKMB, CKMBINDEX, TROPONINI in the last 168 hours. BNP (last 3 results) No results for input(s): PROBNP in the last 8760 hours. HbA1C: No results for input(s): HGBA1C in the last 72 hours. CBG: No results for input(s): GLUCAP in the last 168 hours. Lipid Profile: No results for input(s): CHOL, HDL, LDLCALC, TRIG, CHOLHDL, LDLDIRECT in the last 72 hours. Thyroid Function Tests: No results for input(s): TSH, T4TOTAL, FREET4, T3FREE, THYROIDAB in the last 72 hours. Anemia Panel: No results for input(s): VITAMINB12,  FOLATE, FERRITIN, TIBC, IRON, RETICCTPCT in the last 72 hours. Sepsis Labs: Recent Labs  Lab 08/22/24 1808 08/23/24 0144 08/23/24 0509 08/23/24 0512  PROCALCITON  --   --   --  0.27  LATICACIDVEN 2.1* 1.0 1.5  --     Recent Results (from the past 240 hours)  Resp panel by RT-PCR (RSV, Flu A&B, Covid) Anterior Nasal Swab     Status: None   Collection Time: 08/22/24  9:01 PM   Specimen: Anterior Nasal Swab  Result Value Ref Range Status   SARS Coronavirus 2 by RT PCR NEGATIVE NEGATIVE Final   Influenza A by PCR NEGATIVE NEGATIVE Final   Influenza B by PCR NEGATIVE NEGATIVE Final   Resp Syncytial Virus by PCR NEGATIVE NEGATIVE Final    Comment: Performed at [facility], [address] [place], [place] [address]  Culture, blood (Routine X 2) w Reflex to ID Panel     Status: None (Preliminary result)  Collection Time: 08/23/24  1:44 AM   Specimen: BLOOD  Result Value Ref Range Status   Specimen Description BLOOD BLOOD RIGHT HAND  Final   Special Requests   Final    BOTTLES DRAWN AEROBIC AND ANAEROBIC Blood Culture results may not be optimal due to an inadequate volume of blood received in culture bottles   Culture   Final    NO GROWTH 1 DAY Performed at [facility], [address] [place], [place] [address]    Report Status PENDING  Incomplete  Culture, blood (Routine X 2) w Reflex to ID Panel     Status: None (Preliminary result)   Collection Time: 08/23/24  1:44 AM   Specimen: BLOOD  Result Value Ref Range Status   Specimen Description BLOOD BLOOD LEFT ARM  Final   Special Requests   Final    BOTTLES DRAWN AEROBIC AND ANAEROBIC Blood Culture results may not be optimal due to an inadequate volume of blood received in culture bottles   Culture   Final    NO GROWTH 1 DAY Performed at [facility], [address], [place], [place] [address]    Report Status PENDING  Incomplete  Body fluid culture w Gram Stain     Status: None  (Preliminary result)   Collection Time: 08/23/24  7:52 AM   Specimen: Synovium; Body Fluid  Result Value Ref Range Status   Specimen Description   Final    SYNOVIAL Performed at [facility], [address] [place], [place] [address]    Special Requests   Final    BURSA Performed at [facility], [address] [place], [place] [address]    Gram Stain NO WBC SEEN NO ORGANISMS SEEN   Final   Culture   Final    NO GROWTH < 24 HOURS Performed at [facility], [address], [place], [place] [address]    Report Status PENDING  Incomplete         Radiology Studies: CT Head Wo Contrast Result Date: 08/22/2024 EXAM: CT HEAD WITHOUT CONTRAST 08/22/2024 11:24:29 PM TECHNIQUE: CT of the head was performed without the administration of intravenous contrast. Automated exposure control, iterative reconstruction, and/or weight based adjustment of the mA/kV was utilized to reduce the radiation dose to as low as reasonably achievable. COMPARISON: MRI 06/20/2021. CLINICAL HISTORY: Mental status change, unknown cause. FINDINGS: BRAIN AND VENTRICLES: No acute hemorrhage. No evidence of acute infarct. No hydrocephalus. No extra-axial collection. No mass effect or midline shift. Atrophy and chronic small vessel disease throughout the deep white matter. ORBITS: No acute abnormality. SINUSES: No acute abnormality. SOFT TISSUES AND SKULL: No acute soft tissue abnormality. No skull fracture. IMPRESSION: 1. No acute intracranial abnormality. 2. Atrophy and chronic small vessel disease throughout the deep white matter. Electronically signed by: Franky Crease MD 08/22/2024 11:27 PM EST RP Workstation: [id number]   DG Elbow Complete Left Result Date: 08/22/2024 EXAM: 3 VIEW(S) XRAY OF THE LEFT ELBOW COMPARISON: None available. CLINICAL HISTORY: left elbow swelling FINDINGS: BONES AND JOINTS: Cortical irregularity of the olecranon, possible representing chronic erosion. Extensive  enthesopathy involving the triceps tendon. No significant joint space narrowing noted. No definite effusion. No acute fracture or dislocation. 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 Additional considerations could include posttraumatic change or inflammatory process such as dermatomyositis.  IMPRESSION: 1. 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 2. Extensive enthesopathy involving the triceps tendon. 3. Cortical irregularity of the olecranon, possibly representing chronic erosion. Electronically signed by: Dorethia Molt MD 08/22/2024 11:23 PM EST RP Workstation: [id number]   DG Chest Portable 1 View Result Date: 08/22/2024 EXAM: 1 VIEW(S) XRAY OF THE CHEST 08/22/2024 11:14:24 PM COMPARISON: None available. CLINICAL HISTORY: Weakness. FINDINGS: LUNGS AND PLEURA: Elevated left hemidiaphragm. Calcified granuloma in left upper lung. No pleural effusion. No pneumothorax. HEART AND MEDIASTINUM: Aortic calcification. Questionable prominence of ascending aorta. BONES AND SOFT TISSUES: No acute osseous abnormality. IMPRESSION: 1. No acute cardiopulmonary process identified. 2. Questionable prominence of the ascending aorta, consider correlation to exclude aneurysmal dilation. 3. Elevated left hemidiaphragm. 4. Aortic atherosclerosis. Electronically signed by: Dorethia Molt MD 08/22/2024 11:19 PM EST RP Workstation: [id number]        Scheduled Meds:  colchicine   0.6 mg Oral Daily   donepezil   23 mg Oral Daily   enoxaparin  (LOVENOX ) injection  40 mg Subcutaneous Q24H   Gerhardt's  butt cream   Topical TID   hydrALAZINE   25 mg Oral Q8H   memantine   10 mg Oral BID   pantoprazole   20 mg Oral Daily   pravastatin   40 mg Oral Daily   traZODone   25 mg Oral QHS   Continuous Infusions:  cefTRIAXone  (ROCEPHIN )  IV 2 g (08/24/24 0939)   vancomycin  1,000 mg (08/24/24 1127)          Burnard DELENA Cunning, DO [facility] 08/24/2024, 1:08 PM

## 2024-08-24 NOTE — TOC Initial Note (Signed)
 Transition of Care [facility]) - Initial/Assessment Note    Patient Details  Name: Jason Estrada MRN: [id number] Date of Birth: Mar 16, 1941  [facility]) CM/SW Contact:    Alvaro Louder, LCSW Phone Number: 08/24/2024, 2:18 PM  Clinical Narrative:       Per chart review patient is from home. PCP is Jolene Cannady LCSWA faxed out patient to SNF's in [place]. TOC to present facilities to patient at the bedside.           TOC to follow for discharge         Patient Goals and CMS Choice            Expected Discharge Plan and Services                                              Prior Living Arrangements/Services                       Activities of Daily Living      Permission Sought/Granted                  Emotional Assessment              Admission diagnosis:  Confusion [R41.0] SIRS (systemic inflammatory response syndrome) (HCC) [R65.10] Generalized weakness [R53.1] Acute gout of elbow, unspecified cause, unspecified laterality [M10.9] Alzheimer's dementia, unspecified dementia severity, unspecified timing of dementia onset, unspecified whether behavioral, psychotic, or mood disturbance or anxiety (HCC) [G30.9, F02.80] Patient Active Problem List   Diagnosis Date Noted   SIRS (systemic inflammatory response syndrome) (HCC) 08/23/2024   Vitamin B12 deficiency 02/18/2023   GERD without esophagitis 02/18/2023   Chronic pain of right knee 08/20/2022   Primary hyperparathyroidism 08/18/2022   Acute gout 02/13/2022   Prostate cancer 10/22/2021   Sinus tachycardia 09/26/2021   Dementia due to Alzheimer's disease 08/15/2021   Essential hypertension 05/28/2021   Hyperlipidemia 12/16/2018   Chronic kidney disease, stage 4 (severe) 12/16/2018   PCP:  [name], NP Pharmacy:   [phone] - [place], [place] - [address] [place] [address] Phone: [phone] Fax: [phone]     Social  Drivers of Health (SDOH) Social History: SDOH Screenings   Food Insecurity: No Food Insecurity (08/23/2024)  Housing: Low Risk  (08/23/2024)  Transportation Needs: No Transportation Needs (08/23/2024)  Utilities: Not At Risk (08/23/2024)  Depression (PHQ2-9): Low Risk  (06/22/2024)  Social Connections: Unknown (08/23/2024)  Tobacco Use: Low Risk  (08/22/2024)   SDOH Interventions:     Readmission Risk Interventions     No data to display

## 2024-08-24 NOTE — Evaluation (Signed)
 Physical Therapy Evaluation Patient Details Name: Jason Estrada MRN: [id number] DOB: 1941-03-10 Today's Date: 08/24/2024  History of Present Illness  Lemon Sternberg is a 83 y.o. male with medical history significant for CKD 4, hypertension, gout, dementia being admitted with SIRS secondary to a gout flare left elbow versus SIRS/sepsis from olecranon bursitis/septic joint.  He was brought in by family with generalized weakness.  His wife noticed that he was having difficulty getting up from his lift chair and stated when she tried to help him up he seemed to wince when she held his left elbow.  After arriving in the ED, she noticed the elbow appeared red and inflamed as usually he wears long sleeves.  She states the same thing happened when he was admitted for flareup of gout of his left wrist a few years prior.  Clinical Impression  Patient noted to be in supine position at PT arrival in room, for an initial PT evaluation due to a decline in functional status, with baseline mobility reported as independent with occasional assistance from wife, and currently requiring minA transfers and modA for ambulation. The patient is A&O x 4. The patient resides in a house and lives with wife with family/friend support. Gait was assessed with RW, limited by generalized weakness. Gait mechanic observations noted shuffled gait and observable weakness. The overall clinical impression is that the patient presents with moderate mobility limitations secondary to acute medical complications. Recommended skilled PT will address safety, mobility, and discharge planning. PT recommendation to d/c patient to SNF upon medical clearance.        If plan is discharge home, recommend the following: A little help with walking and/or transfers;A little help with bathing/dressing/bathroom;Help with stairs or ramp for entrance;Assist for transportation   Can travel by private vehicle        Equipment Recommendations BSC/3in1  (elevated toilet)  Recommendations for Other Services       Functional Status Assessment Patient has had a recent decline in their functional status and/or demonstrates limited ability to make significant improvements in function in a reasonable and predictable amount of time     Precautions / Restrictions Precautions Precautions: Fall Restrictions Weight Bearing Restrictions Per Provider Order: No      Mobility  Bed Mobility Overal bed mobility: Needs Assistance Bed Mobility: Supine to Sit     Supine to sit: Mod assist, Min assist, Used rails          Transfers Overall transfer level: Needs assistance Equipment used: Rolling walker (2 wheels) Transfers: Sit to/from Stand Sit to Stand: Min assist, Mod assist           General transfer comment: vc for limited use of LUE with RW; vc for movement sequence and RW management    Ambulation/Gait Ambulation/Gait assistance: Min assist, Mod assist Gait Distance (Feet): 25 Feet Assistive device: Rolling walker (2 wheels) Gait Pattern/deviations: Step-through pattern, Drifts right/left, Shuffle, Trunk flexed     Pre-gait activities: standing weight shift General Gait Details: needs continued vc for trunk positioning; vc for magement of RW attempts to lift RW occasionally  Stairs            Wheelchair Mobility     Tilt Bed    Modified Rankin (Stroke Patients Only)       Balance Overall balance assessment: Needs assistance   Sitting balance-Leahy Scale: Good     Standing balance support: Reliant on assistive device for balance, During functional activity Standing balance-Leahy Scale: Fair  Pertinent Vitals/Pain Pain Assessment Breathing: normal Negative Vocalization: none Facial Expression: smiling or inexpressive Body Language: relaxed Consolability: no need to console PAINAD Score: 0    Home Living Family/patient expects to be discharged to:: Private  residence Living Arrangements: Alone Available Help at Discharge: Family Type of Home: House Home Access: Stairs to enter Entrance Stairs-Rails: None Entrance Stairs-Number of Steps: 1   Home Layout: One level Home Equipment: Cane - single point;Standard Environmental Consultant;Shower seat - built in Additional Comments: normally use cane    Prior Function Prior Level of Function : Independent/Modified Independent             Mobility Comments: independent with cane mostly; wife has to call ambulance to lift pt. from the toilet often due to weakness ADLs Comments: some assistance with batheing to make sure he doesn't fall; independent for other ADLs     Extremity/Trunk Assessment   Upper Extremity Assessment Upper Extremity Assessment: Generalized weakness;LUE deficits/detail LUE Deficits / Details: limited elbow AROM/PROM limited 10 degress for full extension; MMT not tested    Lower Extremity Assessment Lower Extremity Assessment: Generalized weakness    Cervical / Trunk Assessment Cervical / Trunk Assessment: Normal  Communication   Communication Communication: No apparent difficulties    Cognition Arousal: Alert Behavior During Therapy: WFL for tasks assessed/performed   PT - Cognitive impairments: No apparent impairments                         Following commands: Intact       Cueing Cueing Techniques: Verbal cues     General Comments      Exercises     Assessment/Plan    PT Assessment Patient needs continued PT services  PT Problem List Decreased strength;Decreased activity tolerance;Decreased balance;Decreased mobility;Decreased range of motion       PT Treatment Interventions Gait training;Stair training;Functional mobility training;Therapeutic activities;Therapeutic exercise;Patient/family education;Neuromuscular re-education;Balance training    PT Goals (Current goals can be found in the Care Plan section)  Acute Rehab PT Goals Patient Stated  Goal: Pt and wife in room wants to get stronger PT Goal Formulation: With patient/family Time For Goal Achievement: 09/14/24 Potential to Achieve Goals: Fair    Frequency Min 2X/week     Co-evaluation               AM-PAC PT 6 Clicks Mobility  Outcome Measure Help needed turning from your back to your side while in a flat bed without using bedrails?: A Little Help needed moving from lying on your back to sitting on the side of a flat bed without using bedrails?: A Little Help needed moving to and from a bed to a chair (including a wheelchair)?: A Little Help needed standing up from a chair using your arms (e.g., wheelchair or bedside chair)?: A Little Help needed to walk in hospital room?: A Little Help needed climbing 3-5 steps with a railing? : A Lot 6 Click Score: 17    End of Session Equipment Utilized During Treatment: Gait belt Activity Tolerance: Patient tolerated treatment well Patient left: in chair;with chair alarm set;with family/visitor present;with nursing/sitter in room Nurse Communication: Mobility status PT Visit Diagnosis: Other abnormalities of gait and mobility (R26.89);Repeated falls (R29.6);Muscle weakness (generalized) (M62.81);Difficulty in walking, not elsewhere classified (R26.2);Unsteadiness on feet (R26.81)    Time: 9078-9059 PT Time Calculation (min) (ACUTE ONLY): 19 min   Charges:   PT Evaluation $PT Eval Low Complexity: 1 Low   PT General Charges $$  ACUTE PT VISIT: 1 Visit         Sherlean Lesches DPT, PT    Sherlean A Alyssa Rotondo 08/24/2024, 12:00 PM

## 2024-08-24 NOTE — [place] FL2 (Signed)
 [place]  MEDICAID FL2 LEVEL OF CARE FORM     IDENTIFICATION  Patient Name: Jason Estrada Birthdate: 1941/07/05 Sex: male Admission Date (Current Location): 08/22/2024  [facility] and [place] Number:  Chiropodist and Address:  [facility], [address], [place], [place] [address]      Provider Number: [id number]  Attending Physician Name and Address:  Fausto Burnard LABOR, DO  Relative Name and Phone Number:       Current Level of Care: Hospital Recommended Level of Care: Skilled Nursing Facility Prior Approval Number:    Date Approved/Denied:   PASRR Number: [id number]  Discharge Plan: SNF    Current Diagnoses: Patient Active Problem List   Diagnosis Date Noted   SIRS (systemic inflammatory response syndrome) (HCC) 08/23/2024   Vitamin B12 deficiency 02/18/2023   GERD without esophagitis 02/18/2023   Chronic pain of right knee 08/20/2022   Primary hyperparathyroidism 08/18/2022   Acute gout 02/13/2022   Prostate cancer 10/22/2021   Sinus tachycardia 09/26/2021   Dementia due to Alzheimer's disease 08/15/2021   Essential hypertension 05/28/2021   Hyperlipidemia 12/16/2018   Chronic kidney disease, stage 4 (severe) 12/16/2018    Orientation RESPIRATION BLADDER Height & Weight     Self  Normal Continent Weight: 188 lb 0.8 oz (85.3 kg) Height:  6' (182.9 cm)  BEHAVIORAL SYMPTOMS/MOOD NEUROLOGICAL BOWEL NUTRITION STATUS      Continent Diet  AMBULATORY STATUS COMMUNICATION OF NEEDS Skin   Limited Assist   Normal                       Personal Care Assistance Level of Assistance  Bathing, Dressing, Feeding Bathing Assistance: Limited assistance Feeding assistance: Independent Dressing Assistance: Limited assistance     Functional Limitations Info  Sight, Hearing, Speech Sight Info: Impaired Hearing Info: Adequate Speech Info: Adequate    SPECIAL CARE FACTORS FREQUENCY  PT (By licensed PT), OT (By licensed OT)      PT Frequency: 5x/week OT Frequency: 5x/week            Contractures      Additional Factors Info  Code Status, Allergies Code Status Info: Full Allergies Info: NKA           Current Medications (08/24/2024):  This is the current hospital active medication list Current Facility-Administered Medications  Medication Dose Route Frequency Provider Last Rate Last Admin   acetaminophen  (TYLENOL ) tablet 650 mg  650 mg Oral Q6H PRN [name], [name], MD       Or   acetaminophen  (TYLENOL ) suppository 650 mg  650 mg Rectal Q6H PRN [name], [name], MD       albuterol  (PROVENTIL ) (2.5 MG/3ML) 0.083% nebulizer solution 2.5 mg  2.5 mg Nebulization Q2H PRN [name], [name], MD       cefTRIAXone  (ROCEPHIN ) 2 g in sodium chloride  0.9 % 100 mL IVPB  2 g Intravenous Q24H [name], [name], MD 200 mL/hr at 08/24/24 0939 2 g at 08/24/24 9060   colchicine  tablet 0.6 mg  0.6 mg Oral Daily [name], [name], MD   0.6 mg at 08/24/24 9092   donepezil  (ARICEPT ) tablet 23 mg  23 mg Oral Daily [name], [name], MD   23 mg at 08/24/24 0907   enoxaparin  (LOVENOX ) injection 40 mg  40 mg Subcutaneous Q24H [name], MD   40 mg at 08/24/24 9093   [name]   Topical TID [name], MD   Given at 08/24/24  9091   haloperidol  lactate (HALDOL ) injection 1 mg  1 mg Intravenous Q6H PRN [name], [name], MD   1 mg at 08/23/24 2026   hydrALAZINE  (APRESOLINE ) tablet 25 mg  25 mg Oral Q8H [name], [name], MD   25 mg at 08/23/24 2026   HYDROcodone -acetaminophen  (NORCO/VICODIN) 5-325 MG per tablet 1 tablet  1 tablet Oral Q4H PRN [name], [name], MD   1 tablet at 08/23/24 2347   memantine  (NAMENDA ) tablet 10 mg  10 mg Oral BID [name], [name], MD   10 mg at 08/24/24 9092   ondansetron  (ZOFRAN ) tablet 4 mg  4 mg Oral Q6H PRN [name], [name], MD       Or   ondansetron  (ZOFRAN ) injection 4 mg  4 mg Intravenous Q6H PRN [name], MD       pantoprazole  (PROTONIX ) EC tablet 20 mg  20 mg Oral Daily [name], Hazel  V, MD   20 mg at 08/24/24 0906   pravastatin  (PRAVACHOL ) tablet 40 mg  40 mg Oral Daily [name], [name], MD   40 mg at 08/24/24 9092   traZODone  (DESYREL ) tablet 25 mg  25 mg Oral QHS [name], [name], MD   25 mg at 08/23/24 2026   vancomycin  (VANCOCIN ) IVPB 1000 mg/200 mL premix  1,000 mg Intravenous Q24H [name], [name], MD 200 mL/hr at 08/24/24 1127 1,000 mg at 08/24/24 1127     Discharge Medications: Please see discharge summary for a list of discharge medications.  Relevant Imaging Results:  Relevant Lab Results:   Additional Information SSN: [id number]  Alvaro Louder, LCSW

## 2024-08-24 NOTE — Plan of Care (Signed)
  Problem: Fluid Volume: Goal: Hemodynamic stability will improve Outcome: Progressing   Problem: Respiratory: Goal: Ability to maintain adequate ventilation will improve Outcome: Progressing   Problem: Health Behavior/Discharge Planning: Goal: Ability to manage health-related needs will improve Outcome: Progressing   Problem: Coping: Goal: Level of anxiety will decrease Outcome: Progressing   Problem: Pain Managment: Goal: General experience of comfort will improve and/or be controlled Outcome: Progressing

## 2024-08-25 LAB — BASIC METABOLIC PANEL WITH GFR
Anion gap: 11 (ref 5–15)
BUN: 27 mg/dL — ABNORMAL HIGH (ref 8–23)
CO2: 22 mmol/L (ref 22–32)
Calcium: 8.5 mg/dL — ABNORMAL LOW (ref 8.9–10.3)
Chloride: 106 mmol/L (ref 98–111)
Creatinine, Ser: 1.69 mg/dL — ABNORMAL HIGH (ref 0.61–1.24)
GFR, Estimated: 40 mL/min — ABNORMAL LOW (ref >60)
Glucose, Bld: 96 mg/dL (ref 70–99)
Potassium: 3.6 mmol/L (ref 3.5–5.1)
Sodium: 138 mmol/L (ref 135–145)

## 2024-08-25 LAB — CBC
HCT: 31 % — ABNORMAL LOW (ref 39.0–52.0)
Hemoglobin: 9.7 g/dL — ABNORMAL LOW (ref 13.0–17.0)
MCH: 31.1 pg (ref 26.0–34.0)
MCHC: 31.3 g/dL (ref 30.0–36.0)
MCV: 99.4 fL (ref 80.0–100.0)
Platelets: 193 K/uL (ref 150–400)
RBC: 3.12 MIL/uL — ABNORMAL LOW (ref 4.22–5.81)
RDW: 12.8 % (ref 11.5–15.5)
WBC: 5.8 K/uL (ref 4.0–10.5)
nRBC: 0 % (ref 0.0–0.2)

## 2024-08-25 NOTE — Progress Notes (Signed)
 Patient ID: Jason Estrada, male   DOB: November 07, 1940, 83 y.o.   MRN: [id number]  Subjective: The patient still notes some sensitivity to the elbow region, but feels that he is moving his elbow better.  He denies any new complaints pertaining to the elbow.  His white count continues to improve and he remains afebrile.  His cultures show no growth after 2 days.   Objective: Vital signs in last 24 hours: Temp:  [97.7 F (36.5 C)-98.3 F (36.8 C)] 98 F (36.7 C) (12/10 0415) Pulse Rate:  [78-83] 78 (12/10 0415) Resp:  [17-19] 18 (12/10 0415) BP: (147-163)/(66-90) 151/66 (12/10 0415) SpO2:  [98 %-100 %] 98 % (12/10 0415)  Intake/Output from previous day: 12/09 0701 - 12/10 0700 In: 420 [P.O.:120; IV Piggyback:300] Out: 1100 [Urine:1100] Intake/Output this shift: No intake/output data recorded.  Recent Labs    08/22/24 1808 08/24/24 0520 08/25/24 0548  HGB 12.0* 10.4* 9.7*   Recent Labs    08/24/24 0520 08/25/24 0548  WBC 7.6 5.8  RBC 3.37* 3.12*  HCT 32.7* 31.0*  PLT 167 193   Recent Labs    08/24/24 0520 08/25/24 0548  NA 141 138  K 4.3 3.6  CL 106 106  CO2 23 22  BUN 25* 27*  CREATININE 1.73* 1.69*  GLUCOSE 100* 96  CALCIUM 8.9 8.5*   No results for input(s): LABPT, INR in the last 72 hours.  Physical Exam: Orthopedic examination again is limited to the left elbow and upper extremity.  Moderate swelling persists over the posterior aspect of the elbow, but the overlying erythema continues to improve as compared to his examination on the day of admission.  He has mild tenderness around the posterior aspect of the elbow, but is able to actively range his elbow from 20 to 90 degrees more freely and with less pain.  No clear fluid pockets or collections are identified by palpation.  He remains grossly neurovascularly intact to the left upper extremity and hand.  Assessment: Septic left olecranon bursitis, improving symptomatically.  Plan: The treatment options  again are reviewed with the patient.  At this point, I do not see any need for formal irrigation and debridement of the septic left olecranon bursa.  I feel that we may continue to manage him with IV antibiotics for another 24 to 48 hours before considering converting him to oral antibiotics.  The patient is anxious to be discharged home, but I feel that we need to be sure that we are fully on top of this infection before sending him home.   Lajuane Leatham J Carolan Avedisian 08/25/2024, 7:55 AM

## 2024-08-25 NOTE — TOC Progression Note (Addendum)
 Transition of Care [facility]) - Progression Note    Patient Details  Name: Jason Estrada MRN: [id number] Date of Birth: 03-Apr-1941  Transition of [facility]) CM/SW Contact  Alvaro Louder, [place] Phone Number: 08/25/2024, 11:47 AM  Clinical Narrative:   Patient and wife selected SNF [facility]. Patient needs one more midnight stay per medicare guidelines. Once completed, and patient medically ready he will transport to [facility].   TOC to follow for discharge                      Expected Discharge Plan and Services                                               Social Drivers of Health (SDOH) Interventions SDOH Screenings   Food Insecurity: No Food Insecurity (08/23/2024)  Housing: Low Risk  (08/23/2024)  Transportation Needs: No Transportation Needs (08/23/2024)  Utilities: Not At Risk (08/23/2024)  Depression (PHQ2-9): Low Risk  (06/22/2024)  Social Connections: Unknown (08/23/2024)  Tobacco Use: Low Risk  (08/22/2024)    Readmission Risk Interventions     No data to display

## 2024-08-25 NOTE — Care Management Important Message (Signed)
 Important Message  Patient Details  Name: Jason Estrada MRN: [id number] Date of Birth: [birth year]   Important Message Given:  Yes - Medicare IM     Ulus Hazen W, CMA 08/25/2024, 12:30 PM

## 2024-08-25 NOTE — Progress Notes (Addendum)
 PROGRESS NOTE    Jason Estrada  FMW:[id number] DOB: 1941/02/11 DOA: 08/22/2024 PCP: [name], NP   Brief Narrative:   Jason Estrada is a 83 y.o. male with medical history significant for CKD 3b, hypertension, gout, dementia being admitted with SIRS secondary to a gout flare left elbow versus SIRS/sepsis from olecranon bursitis/septic joint.  He was brought in by family with generalized weakness.  His wife noticed that he was having difficulty getting up from his lift chair and stated when she tried to help him up he seemed to wince when she held his left elbow.  After arriving in the ED, she noticed the elbow appeared red and inflamed as usually he wears long sleeves.  She states the same thing happened when he was admitted for flareup of gout of his left wrist a few years prior.  He On arrival he had a low-grade temp of 99.5 and tachycardic to the 100 with otherwise unremarkable vitals.  Labs notable for leukocytosis of 12,200 with a lactic acid of 2.1->1.0.  Urinalysis sterile and respiratory viral panel negative for COVID flu and RSV.  Other labs for the most part near baseline with creatinine 1.9 EKG showed sinus tachycardia at 104 Chest x-ray nonacute.  X-ray of the left elbow showed gouty tophi CT head nonacute    Patient is being treated with IV antibiotics for septic left elbow olecrannon bursitis.  Ortho is following.    Further hospital course and management as outlined below.    Assessment & Plan:   Principal Problem:   SIRS (systemic inflammatory response syndrome) (HCC) Active Problems:   Acute gout   Dementia due to Alzheimer's disease   Essential hypertension   Chronic kidney disease, stage 4 (severe)   Prostate cancer   * Sepsis 2/2 olecranon bursitis Resolved  Lactic acidosis Painful elbow: Gout flare versus septic joint versus olecranon bursitis SIRS criteria include temp of 99.5 with tachycardia of 100, WBC 12 and lactic acid 2.1-->1.0 Evaluated by  orthopedics, aspiration of bursa with small amount of fluid by orthopedics, sent for culture. CX NG, BCX NG  --Per Ortho continue antibiotics  --Continue empiric IV Vancomycin  & Rocephin     Acute gout Gout flare versus septic bursitis Continue colchicine , Norco as needed for pain  Generalized weakness: PT recommending SNF     Prostate cancer No acute issues suspected   Chronic kidney disease, stage 3b At baseline --Monitor BMP   Essential hypertension --Continue home meds   Dementia due to Alzheimer's disease Delirium precautions --Continue home Aricept  and Namenda  and trazodone   sacral pressure injury; Stage I , POA, will consult wound care       DVT prophylaxis: SCD for procedure   Consults: Dr. Edie, orthopedics   Advance Care Planning:   Code Status: Prior    Family Communication: Wife at bedside.     Disposition Plan: Back to previous home environment with home health versus SNF      Subjective:  Patient seen in bed with no complaints.  He is eager to get home. Feels his elbow is improved.    Objective: Vitals:   08/24/24 2014 08/25/24 0415 08/25/24 0805 08/25/24 1435  BP: (!) 150/68 (!) 151/66 (!) 147/73 (!) 127/56  Pulse: 80 78 85 85  Resp: 18 18 17 17   Temp: 98.3 F (36.8 C) 98 F (36.7 C) 98.1 F (36.7 C) 98.3 F (36.8 C)  TempSrc:      SpO2: 98% 98% 97% 97%  Weight:  Height:        Intake/Output Summary (Last 24 hours) at 08/25/2024 1709 Last data filed at 08/25/2024 1300 Gross per 24 hour  Intake 540 ml  Output 1100 ml  Net -560 ml   Filed Weights   08/22/24 1806  Weight: 85.3 kg    Examination:   Physical Exam  Constitutional: In no distress.  Cardiovascular: Normal rate, regular rhythm. No lower extremity edema  Pulmonary: Non labored breathing on room air, no wheezing or rales.   Abdominal: Soft. Non distended and non tender Musculoskeletal: L elbow with edema and erytehma, improved.  Skin: Skin is warm and dry.      Data Reviewed: I have personally reviewed following labs and imaging studies  CBC: Recent Labs  Lab 08/22/24 1808 08/24/24 0520 08/25/24 0548  WBC 12.2* 7.6 5.8  NEUTROABS  --  6.2  --   HGB 12.0* 10.4* 9.7*  HCT 37.4* 32.7* 31.0*  MCV 97.9 97.0 99.4  PLT 249 167 193   Basic Metabolic Panel: Recent Labs  Lab 08/22/24 1808 08/24/24 0520 08/25/24 0548  NA 141 141 138  K 4.1 4.3 3.6  CL 106 106 106  CO2 21* 23 22  GLUCOSE 135* 100* 96  BUN 25* 25* 27*  CREATININE 1.90* 1.73* 1.69*  CALCIUM 9.1 8.9 8.5*   GFR: Estimated Creatinine Clearance: 37.6 mL/min (A) (by C-G formula based on SCr of 1.69 mg/dL (H)). Liver Function Tests: Recent Labs  Lab 08/22/24 1808  AST 19  ALT 6  ALKPHOS 95  BILITOT 0.6  PROT 6.7  ALBUMIN 3.7   No results for input(s): LIPASE, AMYLASE in the last 168 hours. No results for input(s): AMMONIA in the last 168 hours. Coagulation Profile: No results for input(s): INR, PROTIME in the last 168 hours. Cardiac Enzymes: No results for input(s): CKTOTAL, CKMB, CKMBINDEX, TROPONINI in the last 168 hours. BNP (last 3 results) No results for input(s): PROBNP in the last 8760 hours. HbA1C: No results for input(s): HGBA1C in the last 72 hours. CBG: No results for input(s): GLUCAP in the last 168 hours. Lipid Profile: No results for input(s): CHOL, HDL, LDLCALC, TRIG, CHOLHDL, LDLDIRECT in the last 72 hours. Thyroid Function Tests: No results for input(s): TSH, T4TOTAL, FREET4, T3FREE, THYROIDAB in the last 72 hours. Anemia Panel: No results for input(s): VITAMINB12, FOLATE, FERRITIN, TIBC, IRON, RETICCTPCT in the last 72 hours. Sepsis Labs: Recent Labs  Lab 08/22/24 1808 08/23/24 0144 08/23/24 0509 08/23/24 0512  PROCALCITON  --   --   --  0.27  LATICACIDVEN 2.1* 1.0 1.5  --     Recent Results (from the past 240 hours)  Resp panel by RT-PCR (RSV, Flu A&B, Covid) Anterior  Nasal Swab     Status: None   Collection Time: 08/22/24  9:01 PM   Specimen: Anterior Nasal Swab  Result Value Ref Range Status   SARS Coronavirus 2 by RT PCR NEGATIVE NEGATIVE Final   Influenza A by PCR NEGATIVE NEGATIVE Final   Influenza B by PCR NEGATIVE NEGATIVE Final   Resp Syncytial Virus by PCR NEGATIVE NEGATIVE Final    Comment: Performed at [facility], [address] [place], [place] [address]  Culture, blood (Routine X 2) w Reflex to ID Panel     Status: None (Preliminary result)   Collection Time: 08/23/24  1:44 AM   Specimen: BLOOD  Result Value Ref Range Status   Specimen Description BLOOD BLOOD RIGHT HAND  Final   Special Requests  Final    BOTTLES DRAWN AEROBIC AND ANAEROBIC Blood Culture results may not be optimal due to an inadequate volume of blood received in culture bottles   Culture   Final    NO GROWTH 2 DAYS Performed at [facility], [address], [place], [place] [address]    Report Status PENDING  Incomplete  Culture, blood (Routine X 2) w Reflex to ID Panel     Status: None (Preliminary result)   Collection Time: 08/23/24  1:44 AM   Specimen: BLOOD  Result Value Ref Range Status   Specimen Description BLOOD BLOOD LEFT ARM  Final   Special Requests   Final    BOTTLES DRAWN AEROBIC AND ANAEROBIC Blood Culture results may not be optimal due to an inadequate volume of blood received in culture bottles   Culture   Final    NO GROWTH 2 DAYS Performed at [facility], [address], [place], [place] [address]    Report Status PENDING  Incomplete  Body fluid culture w Gram Stain     Status: None (Preliminary result)   Collection Time: 08/23/24  7:52 AM   Specimen: Synovium; Body Fluid  Result Value Ref Range Status   Specimen Description   Final    SYNOVIAL Performed at [facility], [address] [place], [place] [address]    Special Requests   Final    BURSA Performed at [facility], [address] [place], [place] [address]    Gram Stain NO WBC SEEN NO ORGANISMS SEEN   Final   Culture   Final    NO GROWTH 2 DAYS Performed at [facility], [address] [place], [place] [address]    Report Status PENDING  Incomplete         Radiology Studies: No results found.       Scheduled Meds:  colchicine   0.6 mg Oral Daily   donepezil   23 mg Oral Daily   enoxaparin  (LOVENOX ) injection  40 mg Subcutaneous Q24H   [name]   Topical TID   hydrALAZINE   25 mg Oral Q8H   memantine   10 mg Oral BID   pantoprazole   20 mg Oral Daily   pravastatin   40 mg Oral Daily   traZODone   25 mg Oral QHS   Continuous Infusions:  cefTRIAXone  (ROCEPHIN )  IV Stopped (08/25/24 0905)   vancomycin  1,000 mg (08/25/24 1009)          Alban Pepper, MD [facility] 08/25/2024, 5:09 PM

## 2024-08-25 NOTE — Plan of Care (Signed)
  Problem: Clinical Measurements: Goal: Diagnostic test results will improve Outcome: Progressing Goal: Signs and symptoms of infection will decrease Outcome: Progressing   Problem: Clinical Measurements: Goal: Ability to maintain clinical measurements within normal limits will improve Outcome: Progressing Goal: Will remain free from infection Outcome: Progressing Goal: Diagnostic test results will improve Outcome: Progressing Goal: Respiratory complications will improve Outcome: Progressing Goal: Cardiovascular complication will be avoided Outcome: Progressing

## 2024-08-26 ENCOUNTER — Ambulatory Visit: Payer: Self-pay | Admitting: Pharmacist

## 2024-08-26 DIAGNOSIS — A419 Sepsis, unspecified organism: Principal | ICD-10-CM

## 2024-08-26 DIAGNOSIS — L8990 Pressure ulcer of unspecified site, unspecified stage: Secondary | ICD-10-CM | POA: Insufficient documentation

## 2024-08-26 LAB — CBC
HCT: 32.3 % — ABNORMAL LOW (ref 39.0–52.0)
Hemoglobin: 10.4 g/dL — ABNORMAL LOW (ref 13.0–17.0)
MCH: 30.7 pg (ref 26.0–34.0)
MCHC: 32.2 g/dL (ref 30.0–36.0)
MCV: 95.3 fL (ref 80.0–100.0)
Platelets: 261 K/uL (ref 150–400)
RBC: 3.39 MIL/uL — ABNORMAL LOW (ref 4.22–5.81)
RDW: 12.9 % (ref 11.5–15.5)
WBC: 6.2 K/uL (ref 4.0–10.5)
nRBC: 0 % (ref 0.0–0.2)

## 2024-08-26 LAB — BODY FLUID CULTURE W GRAM STAIN
Culture: NO GROWTH
Gram Stain: NONE SEEN

## 2024-08-26 LAB — BASIC METABOLIC PANEL WITH GFR
Anion gap: 10 (ref 5–15)
BUN: 24 mg/dL — ABNORMAL HIGH (ref 8–23)
CO2: 22 mmol/L (ref 22–32)
Calcium: 9.1 mg/dL (ref 8.9–10.3)
Chloride: 108 mmol/L (ref 98–111)
Creatinine, Ser: 1.75 mg/dL — ABNORMAL HIGH (ref 0.61–1.24)
GFR, Estimated: 39 mL/min — ABNORMAL LOW (ref >60)
Glucose, Bld: 121 mg/dL — ABNORMAL HIGH (ref 70–99)
Potassium: 3.5 mmol/L (ref 3.5–5.1)
Sodium: 140 mmol/L (ref 135–145)

## 2024-08-26 MED ORDER — DOXYCYCLINE HYCLATE 100 MG PO TABS: 100.0000 mg | ORAL_TABLET | Freq: Two times a day (BID) | ORAL | Status: AC

## 2024-08-26 MED ORDER — AMOXICILLIN 875 MG PO TABS: 875.0000 mg | ORAL_TABLET | Freq: Two times a day (BID) | ORAL | Status: AC

## 2024-08-26 MED ORDER — BACLOFEN 10 MG PO TABS: 5.0000 mg | ORAL_TABLET | Freq: Every day | ORAL | Status: DC | PRN

## 2024-08-26 MED ORDER — ACETAMINOPHEN 325 MG PO TABS: 650.0000 mg | ORAL_TABLET | Freq: Four times a day (QID) | ORAL | Status: DC | PRN

## 2024-08-26 MED ORDER — GERHARDT'S BUTT CREAM: 1.0000 | TOPICAL_CREAM | Freq: Three times a day (TID) | CUTANEOUS | Status: DC

## 2024-08-26 NOTE — H&P (Signed)
 Report called to Nurse Suzen LPN at [facility].

## 2024-08-26 NOTE — Plan of Care (Signed)

## 2024-08-26 NOTE — TOC Transition Note (Signed)
 Transition of Care [facility]) - Discharge Note   Patient Details  Name: Jason Estrada MRN: [id number] Date of Birth: [birth year]  [facility]) CM/SW Contact:  Alvaro Louder, LCSW Phone Number: 08/26/2024, 12:55 PM   Clinical Narrative:   LCSWA received insurance approval for patient to admit to SNF [facility]. LCSWA confirmed with MD that patient is stable for discharge. LCSWA notified the patient and spouse, they are in agreement with discharge. LCSWA confirmed bed is available at SNF. Transport arranged with [facility] for next available.  RM: [id number], Number to call report is [phone]   TOC signing off  Final next level of care: Skilled Nursing Facility Barriers to Discharge: No Barriers Identified   Patient Goals and CMS Choice            Discharge Placement              Patient chooses bed at:  ([facility]) Patient to be transferred to facility by: [facility] Name of family member notified: [name] Patient and family notified of of transfer: 08/26/24  Discharge Plan and Services Additional resources added to the After Visit Summary for                                       Social Drivers of Health (SDOH) Interventions SDOH Screenings   Food Insecurity: No Food Insecurity (08/23/2024)  Housing: Low Risk (08/23/2024)  Transportation Needs: No Transportation Needs (08/23/2024)  Utilities: Not At Risk (08/23/2024)  Depression (PHQ2-9): Low Risk (06/22/2024)  Social Connections: Unknown (08/23/2024)  Tobacco Use: Low Risk (08/22/2024)     Readmission Risk Interventions     No data to display

## 2024-08-26 NOTE — Discharge Summary (Addendum)
 Physician Discharge Summary   Patient: Jason Estrada MRN: [id number] DOB: 12/15/1940  Admit date:     08/22/2024  Discharge date: 08/26/2024  Discharge Physician: Alban Pepper   PCP: [name], NP   Recommendations at discharge:    F/u with PCP regarding allopurinol  and colchicine  in setting of CKD Repeat BMP in 1 week to ensure renal function stable  Discharge Diagnoses: Principal Problem:   Sepsis due to olecranon bursitis Active Problems:   Dementia due to Alzheimer's disease   Essential hypertension   Chronic kidney disease, stage 4 (severe)   Prostate cancer   Pressure injury of skin  Resolved Problems:   * No resolved hospital problems. *  Hospital Course: Jason Estrada is a 83 y.o. male with medical history significant for CKD 3b, hypertension, gout, dementia being admitted with SIRS secondary to a gout flare left elbow versus SIRS/sepsis from olecranon bursitis/septic joint.  He was brought in by family with generalized weakness.  His wife noticed that he was having difficulty getting up from his lift chair and stated when she tried to help him up he seemed to wince when she held his left elbow.  After arriving in the ED, she noticed the elbow appeared red and inflamed as usually he wears long sleeves.  She states the same thing happened when he was admitted for flareup of gout of his left wrist a few years prior.  He On arrival he had a low-grade temp of 99.5 and tachycardic to the 100 with otherwise unremarkable vitals.  Labs notable for leukocytosis of 12,200 with a lactic acid of 2.1->1.0.  Urinalysis sterile and respiratory viral panel negative for COVID flu and RSV.  Other labs for the most part near baseline with creatinine 1.9 EKG showed sinus tachycardia at 104 Chest x-ray nonacute.  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  Ortho is following.      Further hospital course and management as outlined below.    Assessment and Plan:  * Sepsis 2/2 olecranon bursitis Resolved  Lactic acidosis Painful elbow: Gout flare versus septic joint versus olecranon bursitis SIRS criteria include temp of 99.5 with tachycardia of 100, WBC 12 and lactic acid 2.1-->1.0 Evaluated by orthopedics, aspiration of bursa with small amount of fluid by orthopedics, sent for culture. Cultures were NG, blood cultures NG. Orthopedics recommended that antibiotics be continued. He received 4 d total of IV antibiotics and will discharge on 6d of PO antibiotics for a total of 10d. Patient was also treated for possible gout flare. He will continue urate lowering medication but his colchicine  will be held until he sees his PCP.    Prostate cancer No acute issues suspected  Chronic kidney disease, stage 3b At baseline  Essential hypertension Home medications were continued.   Dementia due to Alzheimer's disease Home Aricept  and Namenda  and trazodone  were continued.          Consultants: Orthopedics Procedures performed: Aspiration of L olecranon bursa   Disposition: Skilled nursing facility Diet recommendation:  Regular diet DISCHARGE MEDICATION: Allergies as of 08/26/2024   No Known Allergies      Medication List     PAUSE taking these medications    colchicine  0.6 MG tablet Wait to take this until your doctor or other care provider tells you to start again. TAKE 1 TABLET BY MOUTH EVERY DAY  TAKE these medications    acetaminophen  325 MG tablet Commonly known as: TYLENOL  Take 2 tablets (650 mg total) by mouth every 6 (six) hours as needed for mild pain (pain score 1-3) or fever (or Fever >/= 101).   allopurinol  100 MG tablet Commonly known as: ZYLOPRIM  Take 1 tablet (100 mg total) by mouth daily.   amoxicillin 875 MG tablet Commonly known as: AMOXIL Take 1 tablet (875 mg total) by mouth 2 (two) times daily for 6 days. Start  taking on: August 27, 2024   baclofen  10 MG tablet Commonly known as: LIORESAL  Take 0.5 tablets (5 mg total) by mouth daily as needed. What changed: how much to take   cyanocobalamin 1000 MCG tablet Commonly known as: VITAMIN B12 Take 1,000 mcg by mouth daily.   donepezil  23 MG Tabs tablet Commonly known as: ARICEPT  TAKE 1 TABLET (23 MG TOTAL) BY MOUTH DAILY.   doxycycline 100 MG tablet Commonly known as: VIBRA-TABS Take 1 tablet (100 mg total) by mouth 2 (two) times daily for 6 days. Start taking on: August 27, 2024   folic acid  1 MG tablet Commonly known as: FOLVITE  TAKE 1 TABLET BY MOUTH EVERY DAY   [name] Crea Apply 1 Application topically 3 (three) times daily.   hydrALAZINE  50 MG tablet Commonly known as: APRESOLINE  Take 0.5 tablets (25 mg total) by mouth in the morning and at bedtime. What changed:  how much to take when to take this   memantine  10 MG tablet Commonly known as: NAMENDA  Take 1 tab twice a day   pantoprazole  20 MG tablet Commonly known as: PROTONIX  Take 1 tablet (20 mg total) by mouth daily.   pravastatin  40 MG tablet Commonly known as: PRAVACHOL  Take 1 tablet (40 mg total) by mouth daily.   traZODone  50 MG tablet Commonly known as: DESYREL  TAKE HALF TABLET AT NIGHT, MAY INCREASE TO ONE FULL TABLET IF NEEDED   verapamil  240 MG CR tablet Commonly known as: CALAN -SR Take 1 tablet (240 mg total) by mouth daily.        Contact information for follow-up providers     [name], NP. Schedule an appointment as soon as possible for a visit in 1 week(s).   Specialty: Nurse Practitioner Why: Discuss colchicine  and see if elbow is improved. Contact information: [address] [place] [address] [phone]              Contact information for after-discharge care     Destination     [facility] .   Service: Skilled Nursing Contact information: [address] [facility]  [place] [address] [phone]                    Discharge Exam: Filed Weights   08/22/24 1806  Weight: 85.3 kg   Physical Exam  Constitutional: In no distress. Drowsy  Cardiovascular: Normal rate, regular rhythm. No lower extremity edema  Pulmonary: Non labored breathing on room air, no wheezing or rales.   Abdominal: Soft. Non distended and non tender Musculoskeletal: L elbow swollen, improved, mild erythema improved    Neurological: Awakes to voice, alert to self.  Skin: Skin is warm and dry.    Condition at discharge: stable  The results of significant diagnostics from this hospitalization (including imaging, microbiology, ancillary and laboratory) are listed below for reference.   Imaging Studies: CT Head Wo Contrast Result Date: 08/22/2024 EXAM: CT HEAD WITHOUT CONTRAST 08/22/2024 11:24:29 PM  TECHNIQUE: CT of the head was performed without the administration of intravenous contrast. Automated exposure control, iterative reconstruction, and/or weight based adjustment of the mA/kV was utilized to reduce the radiation dose to as low as reasonably achievable. COMPARISON: MRI 06/20/2021. CLINICAL HISTORY: Mental status change, unknown cause. FINDINGS: BRAIN AND VENTRICLES: No acute hemorrhage. No evidence of acute infarct. No hydrocephalus. No extra-axial collection. No mass effect or midline shift. Atrophy and chronic small vessel disease throughout the deep white matter. ORBITS: No acute abnormality. SINUSES: No acute abnormality. SOFT TISSUES AND SKULL: No acute soft tissue abnormality. No skull fracture. IMPRESSION: 1. No acute intracranial abnormality. 2. Atrophy and chronic small vessel disease throughout the deep white matter. Electronically signed by: Franky Crease MD 08/22/2024 11:27 PM EST RP Workstation: [id number]   DG Elbow Complete Left Result Date: 08/22/2024 EXAM: 3 VIEW(S) XRAY OF THE LEFT ELBOW COMPARISON: None available. CLINICAL HISTORY: left elbow swelling  FINDINGS: BONES AND JOINTS: Cortical irregularity of the olecranon, possible representing chronic erosion. Extensive enthesopathy involving the triceps tendon. No significant joint space narrowing noted. No definite effusion. No acute fracture or dislocation. 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 Additional considerations could include posttraumatic change or inflammatory process such as dermatomyositis. IMPRESSION: 1. 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 2. Extensive enthesopathy involving the triceps tendon. 3. Cortical irregularity of the olecranon, possibly representing chronic erosion. Electronically signed by: Dorethia Molt MD 08/22/2024 11:23 PM EST RP Workstation: [id number]   DG Chest Portable 1 View Result Date: 08/22/2024 EXAM: 1 VIEW(S) XRAY OF THE CHEST 08/22/2024 11:14:24 PM COMPARISON: None available. CLINICAL HISTORY: Weakness. FINDINGS: LUNGS AND PLEURA: Elevated left hemidiaphragm. Calcified granuloma in left upper lung. No pleural effusion. No pneumothorax. HEART AND MEDIASTINUM: Aortic calcification. Questionable prominence of ascending aorta. BONES AND SOFT TISSUES: No acute osseous abnormality. IMPRESSION: 1. No acute cardiopulmonary process identified. 2. Questionable prominence of the ascending aorta, consider correlation to exclude aneurysmal dilation. 3. Elevated left hemidiaphragm. 4. Aortic atherosclerosis. Electronically signed by: Dorethia Molt MD 08/22/2024 11:19 PM EST RP Workstation: [id number]    Microbiology: Results for orders placed  or performed during the hospital encounter of 08/22/24  Resp panel by RT-PCR (RSV, Flu A&B, Covid) Anterior Nasal Swab     Status: None   Collection Time: 08/22/24  9:01 PM   Specimen: Anterior Nasal Swab  Result Value Ref Range Status   SARS Coronavirus 2 by RT PCR NEGATIVE NEGATIVE Final   Influenza A by PCR NEGATIVE NEGATIVE Final   Influenza B by PCR NEGATIVE NEGATIVE Final   Resp Syncytial Virus by PCR NEGATIVE NEGATIVE Final    Comment: Performed at [facility], [address] [place], [place] [address]  Culture, blood (Routine X 2) w Reflex to ID Panel     Status: None (Preliminary result)   Collection Time: 08/23/24  1:44 AM   Specimen: BLOOD  Result Value Ref Range Status   Specimen Description BLOOD BLOOD RIGHT HAND  Final   Special Requests   Final    BOTTLES DRAWN AEROBIC AND ANAEROBIC Blood Culture results may not be optimal due to an inadequate volume of blood received in culture bottles   Culture   Final  NO GROWTH 3 DAYS Performed at [facility], [address] [place], [place] [address]    Report Status PENDING  Incomplete  Culture, blood (Routine X 2) w Reflex to ID Panel     Status: None (Preliminary result)   Collection Time: 08/23/24  1:44 AM   Specimen: BLOOD  Result Value Ref Range Status   Specimen Description BLOOD BLOOD LEFT ARM  Final   Special Requests   Final    BOTTLES DRAWN AEROBIC AND ANAEROBIC Blood Culture results may not be optimal due to an inadequate volume of blood received in culture bottles   Culture   Final    NO GROWTH 3 DAYS Performed at [facility], [address] [place], [place] [address]    Report Status PENDING  Incomplete  Body fluid culture w Gram Stain     Status: None (Preliminary result)   Collection Time: 08/23/24  7:52 AM   Specimen: Synovium; Body Fluid  Result Value Ref Range Status   Specimen Description   Final    SYNOVIAL Performed at [facility], [address],  [place], [place] [address]    Special Requests   Final    BURSA Performed at [facility], [address] [place], [place] [address]    Gram Stain NO WBC SEEN NO ORGANISMS SEEN   Final   Culture   Final    NO GROWTH 3 DAYS Performed at [facility], [address], [place], [place] [address]    Report Status PENDING  Incomplete    Labs: CBC: Recent Labs  Lab 08/22/24 1808 08/24/24 0520 08/25/24 0548 08/26/24 0830  WBC 12.2* 7.6 5.8 6.2  NEUTROABS  --  6.2  --   --   HGB 12.0* 10.4* 9.7* 10.4*  HCT 37.4* 32.7* 31.0* 32.3*  MCV 97.9 97.0 99.4 95.3  PLT 249 167 193 261   Basic Metabolic Panel: Recent Labs  Lab 08/22/24 1808 08/24/24 0520 08/25/24 0548 08/26/24 0830  NA 141 141 138 140  K 4.1 4.3 3.6 3.5  CL 106 106 106 108  CO2 21* 23 22 22   GLUCOSE 135* 100* 96 121*  BUN 25* 25* 27* 24*  CREATININE 1.90* 1.73* 1.69* 1.75*  CALCIUM 9.1 8.9 8.5* 9.1   Liver Function Tests: Recent Labs  Lab 08/22/24 1808  AST 19  ALT 6  ALKPHOS 95  BILITOT 0.6  PROT 6.7  ALBUMIN 3.7   CBG: No results for input(s): GLUCAP in the last 168 hours.  Discharge time spent: greater than 30 minutes.  Signed: Alban Pepper, MD [facility] 08/26/2024

## 2024-08-26 NOTE — Discharge Instructions (Signed)
 Please take your antibiotics as prescribed. Please follow up with your PCP and see if your elbow is improved.

## 2024-08-28 LAB — CULTURE, BLOOD (ROUTINE X 2)
Culture: NO GROWTH
Culture: NO GROWTH

## 2024-08-29 ENCOUNTER — Other Ambulatory Visit: Payer: Self-pay | Admitting: Physician Assistant

## 2024-08-30 NOTE — Progress Notes (Incomplete)
 Dementia likely due to Alzheimer's disease   Jason Estrada is a very pleasant 83 y.o. RH male with a history of CKD stage IV,*** history of prostate cancer, anxiety,  recent sepsis due to olecranon bursitis requiring hospitalization, discharged on 08/26/2024 and a history of dementia likely due to Alzheimer's disease  presenting today in follow-up for evaluation of memory loss.  Patient was last seen on 02/27/2024***. Patient is on memantine  10 mg twice daily and donepezil  23 mg daily.  He continues to have cognitive decline, and needs more assistance with ADLs.  Mood is***.  He continues to attend ADP.   Follow-up in 6 months Continue donepezil  23 mg daily and memantine  10 mg twice daily Continue trazodone  25 mg at bedtime Commend resuming ADP Role of cardiovascular risk factors Continue to control other mood medications as per PCP  Discussed the use of AI scribe software for clinical note transcription with the patient, who gave verbal consent to proceed.  History of Present Illness   Any changes in memory since last visit?   Memory is worse .  He does not recognize his wife at times.  He tries to communicate but does not remember words. He picks the skin, ears, laughs at any comment, fidgets, taking of  and putting on his shoes  repeats oneself?  He is less conversant than before  Disoriented when walking into a room?  He may confuse the rooms at times  Leaving objects?  May misplace things but not in unusual places. He took all of the cleaning stuff of the bathroom, he even took the shelf, he was trying to reach the sink, because he was trying to pee in the vanity.  He then decided to urinate in the shower.   Wandering behavior?  denies.  He is always with his wife when at home. Any personality changes since last visit?  Some tantrums  Any worsening depression?:  Denies.   Hallucinations or paranoia?  Denies.   Seizures? denies    Any sleep changes?  He does not sleep  very well because he confuses days and nights. He roams around, getting dressed and undressed. Denies vivid dreams, REM behavior or sleepwalking   Sleep apnea?   Denies.   Any hygiene concerns?  He does not want to shower -wife says Independent of bathing and dressing?  He needs help to get dressed Does the patient needs help with medications?  Wife is in charge  Who is in charge of the finances?  Wife is in charge    Any changes in appetite?  Eats well but even dog food, he binges and can eat 1 lb of cheese, a whole bag of pepperoni, drink all bottle of cranberry juice    Patient have trouble swallowing? Denies.   Does the patient cook? No Any headaches?   denies   Any vision changes?  Denies  Chronic back pain  denies   Ambulates with difficulty?  He is lower than before, and flexes forward.  Uses a cane for stability.    Recent falls or head injuries? Had a soft fall May 23, he went to the front door, fallen in the street [place], wife feel that he was exhausted.  No head injury. Since then she has security locks . Unilateral weakness, numbness or tingling? denies   Any tremors?  Denies   Any anosmia?  Denies   Any incontinence of urine?  Endorsed, wears diapers  Any bowel dysfunction?  Endorse, he is incontinent now since 01/2022   Patient lives with wife  Does the patient drive? No longer drives      Neuropsych evaluation 03/2023 Briefly, results suggested severe impairment surrounding all aspects of learning and memory. Additional impairments were exhibited across processing speed, several aspects of executive functioning (i.e., cognitive flexibility, response inhibition, and safety/judgment), and semantic fluency. Performance variability was exhibited across confrontation naming. Relative to his previous evaluation in January 2023, cognitive decline was exhibited across several domains including processing speed, executive functioning, semantic fluency, confrontation  naming, and recognition/consolidation aspects of memory. Broadly speaking, the rate of decline appeared relatively mild. Stability was exhibited across other assessed domains. Regarding etiology, I continue to have primary concerns surrounding underlying Alzheimer's disease (i.e., dementia due to Alzheimer's disease). Across memory testing, Mr. Naclerio did not benefit from repeated exposure to information across learning trials, was essentially amnestic across memory tasks after a short delay, and performed poorly across yes/no recognition tasks. This suggests the presence of rapid forgetting and a significant memory storage deficit, both of which are hallmark characteristics of Alzheimer's disease. Evidence for progressive cognitive decline over the past 18 months also elevates concerns for the presence of a neurodegenerative illness, as does this decline largely occurring in domains commonly impacted by this illness (e.g., memory, semantic fluency, confrontation naming, executive functioning). His wife's reporting of him rapidly forgetting information and repeating himself continues to align with this illness as well. Overall, this would appear the most likely culprit for ongoing cognitive impairment and progressive decline.   Initial Evaluation 06/04/21 The patient is seen in neurologic consultation at the request of [name], [name], MD for the evaluation of memory.  The patient is accompanied by his wife who supplements the history. 20 y.o. year old male who has had memory issues for about 1 year when he noticed worsening short term memory deficiencies. He pauses till he can find the word -wife says.  He misses appointments frequently. He moved from [place]  1 year ago, and his wife would travel from there to [place] during this time, able to see these changes. This was worse until 4 weeks ago, somehow it is a little bit better but not much. He does repeat the same stories and questions. He denies  depression or irritability. His mood is good. He  likes to play solitaire on his phone, trivia, reading and walking. He sleeps better than when first moving, He has some vivid dreams without  sleepwalking. At times he confuses the days and nights. He wanted to eat breakfast at midnight.  In July he was confused, found to have elevated PSA and was referred to a urologist, workup ongoing. He has some urine hesitancy because of prostatic issues. Currently denies hallucinations or paranoia. Denies leaving objects in unusual places.   Wife reports issues with hygiene.Bathing since around July, is a battle, and he likes to use the same underwear. Wife administers the medications and manages the bills, because he fell victim to scams. His appetite is good and denies trouble swallowing, He cooks and denies leaving the stove on. Wife supervises . He ambulates without devices, but is prone to falls due to gout. Denies head injuries.    He drives with GPS and wife is concerned because he relies on it and still gets lost, took me to the wrong place the other day. He has issues with time gap as well. He believes that he has been in [place] longer, no sense of  when and where he is.  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 Recently has  worked for [employer]. Denies headaches, double vision, dizziness, focal numbness or tingling, unilateral weakness or tremors.  No constipation or diarrhea. Denies anosmia. Denies history of OSA, ETOH or Tobacco. Family History negative for dementia.      PREVIOUS MEDICATIONS:      Objective:     PHYSICAL EXAMINATION:    VITALS:  There were no vitals filed for this visit.  GEN:  The patient appears stated age and is in NAD. HEENT:  Normocephalic, atraumatic.        06/01/2021    9:00 AM  Montreal Cognitive Assessment   Visuospatial/ Executive (0/5) 4  Naming (0/3) 3  Attention: Read list of digits (0/2) 2  Attention: Read list  of letters (0/1) 0  Attention: Serial 7 subtraction starting at 100 (0/3) 1  Language: Repeat phrase (0/2) 2  Language : Fluency (0/1) 0  Abstraction (0/2) 0  Delayed Recall (0/5) 0  Orientation (0/6) 4  Total 16  Adjusted Score (based on education) 17       11/26/2022    4:00 PM 05/14/2022    7:00 AM  MMSE - Mini Mental State Exam  Orientation to time 4 0  Orientation to Place 3 2  Registration 3 3  Attention/ Calculation 4 4  Recall 2 0  Language- name 2 objects 2 2  Language- repeat 1 1  Language- follow 3 step command 3 2  Language- read & follow direction 1 1  Write a sentence 1 0  Copy design 1 1  Total score 25 16        Neurological examination:  Orientation: The patient is alert.  Not oriented to person, place and date.  Cranial nerves: There is good facial symmetry.The speech is not fluent but clear.  No aphasia or dysarthria. Fund of knowledge is reduced. Recent memory impaired and remote memory is normal.  Attention and concentration are normal.  Able to name objects and repeat phrases.  Hearing is intact to conversational tone ***.   Delayed recall *** Sensation: Sensation is intact to light touch throughout Motor: Strength is at least antigravity x4. DTR's 2/4 in UE/LE   Movement examination: Tone: There is normal tone in the UE/LE Abnormal movements:  no tremor.  No myoclonus.  No asterixis.   Coordination:  There is  decremation with RAM's.  Abnormal finger to nose  Gait and Station: The patient has no difficulty arising out of a deep-seated chair without the use of the hands, uses a cane for stability, flexes forward. The patient's stride length is short.  Gait is cautious and narrow.   Thank you for allowing us  the opportunity to participate in the care of this nice patient. Please do not hesitate to contact us  for any questions or concerns.   Total time spent on today's visit was *** minutes dedicated to this patient today, preparing to see patient,  examining the patient, ordering tests and/or medications and counseling the patient, documenting clinical information in the EHR or other health record, independently interpreting results and communicating results to the patient/family, discussing treatment and goals, answering patient's questions and coordinating care.  Cc:  [name], NP  Camie Sevin 08/30/2024 6:31 AM

## 2024-08-31 ENCOUNTER — Ambulatory Visit: Admitting: Physician Assistant

## 2024-09-04 ENCOUNTER — Other Ambulatory Visit: Payer: Self-pay | Admitting: Nurse Practitioner

## 2024-09-16 ENCOUNTER — Other Ambulatory Visit: Payer: Self-pay | Admitting: Physician Assistant

## 2024-09-17 ENCOUNTER — Other Ambulatory Visit: Payer: Self-pay | Admitting: Nurse Practitioner

## 2024-09-17 DIAGNOSIS — I10 Essential (primary) hypertension: Secondary | ICD-10-CM

## 2024-09-25 NOTE — Patient Instructions (Incomplete)
 Healthy Eating, Adult Healthy eating may help you get and keep a healthy body weight, reduce the risk of chronic disease, and live a long and productive life. It is important to follow a healthy eating pattern. Your nutritional and calorie needs should be met mainly by different nutrient-rich foods. What are tips for following this plan? Reading food labels Read labels and choose the following: Reduced or low sodium products. Juices with 100% fruit juice. Foods with low saturated fats (<3 g per serving) and high polyunsaturated and monounsaturated fats. Foods with whole grains, such as whole wheat, cracked wheat, brown rice, and wild rice. Whole grains that are fortified with folic acid . This is recommended for females who are pregnant or who want to become pregnant. Read labels and do not eat or drink the following: Foods or drinks with added sugars. These include foods that contain brown sugar, corn sweetener, corn syrup, dextrose , fructose, glucose, high-fructose corn syrup, honey, invert sugar, lactose, malt syrup, maltose, molasses, raw sugar, sucrose, trehalose, or turbinado sugar. Limit your intake of added sugars to less than 10% of your total daily calories. Do not eat more than the following amounts of added sugar per day: 6 teaspoons (25 g) for females. 9 teaspoons (38 g) for males. Foods that contain processed or refined starches and grains. Refined grain products, such as white flour, degermed cornmeal, white bread, and white rice. Shopping Choose nutrient-rich snacks, such as vegetables, whole fruits, and nuts. Avoid high-calorie and high-sugar snacks, such as potato chips, fruit snacks, and candy. Use oil-based dressings and spreads on foods instead of solid fats such as butter, margarine, sour cream, or cream cheese. Limit pre-made sauces, mixes, and instant products such as flavored rice, instant noodles, and ready-made pasta. Try more plant-protein sources, such as tofu,  tempeh, black beans, edamame, lentils, nuts, and seeds. Explore eating plans such as the Mediterranean diet or vegetarian diet. Try heart-healthy dips made with beans and healthy fats like hummus and guacamole. Vegetables go great with these. Cooking Use oil to saut or stir-fry foods instead of solid fats such as butter, margarine, or lard. Try baking, boiling, grilling, or broiling instead of frying. Remove the fatty part of meats before cooking. Steam vegetables in water  or broth. Meal planning  At meals, imagine dividing your plate into fourths: One-half of your plate is fruits and vegetables. One-fourth of your plate is whole grains. One-fourth of your plate is protein, especially lean meats, poultry, eggs, tofu, beans, or nuts. Include low-fat dairy as part of your daily diet. Lifestyle Choose healthy options in all settings, including home, work, school, restaurants, or stores. Prepare your food safely: Wash your hands after handling raw meats. Where you prepare food, keep surfaces clean by regularly washing with hot, soapy water . Keep raw meats separate from ready-to-eat foods, such as fruits and vegetables. Cook seafood, meat, poultry, and eggs to the recommended temperature. Get a food thermometer. Store foods at safe temperatures. In general: Keep cold foods at 34F (4.4C) or below. Keep hot foods at 134F (60C) or above. Keep your freezer at [facility] (-17.8C) or below. Foods are not safe to eat if they have been between the temperatures of 40-134F (4.4-60C) for more than 2 hours. What foods should I eat? Fruits Aim to eat 1-2 cups of fresh, canned (in natural juice), or frozen fruits each day. One cup of fruit equals 1 small apple, 1 large banana, 8 large strawberries, 1 cup (237 g) canned fruit,  cup (82 g) dried fruit,  or 1 cup (240 mL) 100% juice. Vegetables Aim to eat 2-4 cups of fresh and frozen vegetables each day, including different varieties and colors. One cup  of vegetables equals 1 cup (91 g) broccoli or cauliflower florets, 2 medium carrots, 2 cups (150 g) raw, leafy greens, 1 large tomato, 1 large bell pepper, 1 large sweet potato, or 1 medium white potato. Grains Aim to eat 5-10 ounce-equivalents of whole grains each day. Examples of 1 ounce-equivalent of grains include 1 slice of bread, 1 cup (40 g) ready-to-eat cereal, 3 cups (24 g) popcorn, or  cup (93 g) cooked rice. Meats and other proteins Try to eat 5-7 ounce-equivalents of protein each day. Examples of 1 ounce-equivalent of protein include 1 egg,  oz nuts (12 almonds, 24 pistachios, or 7 walnut halves), 1/4 cup (90 g) cooked beans, 6 tablespoons (90 g) hummus or 1 tablespoon (16 g) peanut butter. A cut of meat or fish that is the size of a deck of cards is about 3-4 ounce-equivalents (85 g). Of the protein you eat each week, try to have at least 8 sounce (227 g) of seafood. This is about 2 servings per week. This includes salmon, trout, herring, sardines, and anchovies. Dairy Aim to eat 3 cup-equivalents of fat-free or low-fat dairy each day. Examples of 1 cup-equivalent of dairy include 1 cup (240 mL) milk, 8 ounces (250 g) yogurt, 1 ounces (44 g) natural cheese, or 1 cup (240 mL) fortified soy milk. Fats and oils Aim for about 5 teaspoons (21 g) of fats and oils per day. Choose monounsaturated fats, such as canola and olive oils, mayonnaise made with olive oil or avocado oil, avocados, peanut butter, and most nuts, or polyunsaturated fats, such as sunflower, corn, and soybean oils, walnuts, pine nuts, sesame seeds, sunflower seeds, and flaxseed. Beverages Aim for 6 eight-ounce glasses of water  per day. Limit coffee to 3-5 eight-ounce cups per day. Limit caffeinated beverages that have added calories, such as soda and energy drinks. If you drink alcohol: Limit how much you have to: 0-1 drink a day if you are male. 0-2 drinks a day if you are male. Know how much alcohol is in your drink.  In the [place] one drink is one 12 oz bottle of beer (355 mL), one 5 oz glass of wine (148 mL), or one 1 oz glass of hard liquor (44 mL). Seasoning and other foods Try not to add too much salt to your food. Try using herbs and spices instead of salt. Try not to add sugar to food. This information is based on [place] nutrition guidelines. To learn more, visit [employer]. Exact amounts may vary. You may need different amounts. This information is not intended to replace advice given to you by your health care provider. Make sure you discuss any questions you have with your health care provider. Document Revised: 06/03/2022 Document Reviewed: 06/03/2022 Elsevier Patient Education  2024 [employer].

## 2024-09-26 ENCOUNTER — Emergency Department

## 2024-09-26 ENCOUNTER — Inpatient Hospital Stay

## 2024-09-26 ENCOUNTER — Other Ambulatory Visit: Payer: Self-pay

## 2024-09-26 ENCOUNTER — Encounter: Payer: Self-pay | Admitting: Emergency Medicine

## 2024-09-26 ENCOUNTER — Inpatient Hospital Stay
Admission: EM | Admit: 2024-09-26 | Discharge: 2024-10-02 | DRG: 092 | Disposition: A | Discharge status: Hospice | Attending: Internal Medicine | Admitting: Internal Medicine

## 2024-09-26 DIAGNOSIS — I129 Hypertensive chronic kidney disease with stage 1 through stage 4 chronic kidney disease, or unspecified chronic kidney disease: Secondary | ICD-10-CM | POA: Diagnosis present

## 2024-09-26 DIAGNOSIS — R7989 Other specified abnormal findings of blood chemistry: Secondary | ICD-10-CM | POA: Insufficient documentation

## 2024-09-26 DIAGNOSIS — I441 Atrioventricular block, second degree: Secondary | ICD-10-CM | POA: Diagnosis present

## 2024-09-26 DIAGNOSIS — F05 Delirium due to known physiological condition: Secondary | ICD-10-CM | POA: Diagnosis present

## 2024-09-26 DIAGNOSIS — F0283 Dementia in other diseases classified elsewhere, unspecified severity, with mood disturbance: Secondary | ICD-10-CM | POA: Diagnosis present

## 2024-09-26 DIAGNOSIS — I7121 Aneurysm of the ascending aorta, without rupture: Secondary | ICD-10-CM | POA: Diagnosis present

## 2024-09-26 DIAGNOSIS — R059 Cough, unspecified: Secondary | ICD-10-CM | POA: Insufficient documentation

## 2024-09-26 DIAGNOSIS — D631 Anemia in chronic kidney disease: Secondary | ICD-10-CM | POA: Diagnosis present

## 2024-09-26 DIAGNOSIS — G309 Alzheimer's disease, unspecified: Secondary | ICD-10-CM | POA: Diagnosis present

## 2024-09-26 DIAGNOSIS — I959 Hypotension, unspecified: Secondary | ICD-10-CM | POA: Diagnosis present

## 2024-09-26 DIAGNOSIS — B9710 Unspecified enterovirus as the cause of diseases classified elsewhere: Secondary | ICD-10-CM | POA: Diagnosis present

## 2024-09-26 DIAGNOSIS — R001 Bradycardia, unspecified: Secondary | ICD-10-CM | POA: Diagnosis not present

## 2024-09-26 DIAGNOSIS — T428X5A Adverse effect of antiparkinsonism drugs and other central muscle-tone depressants, initial encounter: Secondary | ICD-10-CM | POA: Diagnosis present

## 2024-09-26 DIAGNOSIS — T441X5A Adverse effect of other parasympathomimetics [cholinergics], initial encounter: Secondary | ICD-10-CM | POA: Diagnosis not present

## 2024-09-26 DIAGNOSIS — N179 Acute kidney failure, unspecified: Principal | ICD-10-CM | POA: Diagnosis present

## 2024-09-26 DIAGNOSIS — I10 Essential (primary) hypertension: Secondary | ICD-10-CM | POA: Diagnosis present

## 2024-09-26 DIAGNOSIS — E785 Hyperlipidemia, unspecified: Secondary | ICD-10-CM | POA: Diagnosis present

## 2024-09-26 DIAGNOSIS — J9811 Atelectasis: Secondary | ICD-10-CM | POA: Diagnosis present

## 2024-09-26 DIAGNOSIS — G929 Unspecified toxic encephalopathy: Secondary | ICD-10-CM | POA: Diagnosis present

## 2024-09-26 DIAGNOSIS — K219 Gastro-esophageal reflux disease without esophagitis: Secondary | ICD-10-CM | POA: Diagnosis present

## 2024-09-26 DIAGNOSIS — Z9049 Acquired absence of other specified parts of digestive tract: Secondary | ICD-10-CM

## 2024-09-26 DIAGNOSIS — R4182 Altered mental status, unspecified: Secondary | ICD-10-CM

## 2024-09-26 DIAGNOSIS — Z79899 Other long term (current) drug therapy: Secondary | ICD-10-CM

## 2024-09-26 DIAGNOSIS — N184 Chronic kidney disease, stage 4 (severe): Secondary | ICD-10-CM | POA: Diagnosis present

## 2024-09-26 DIAGNOSIS — M109 Gout, unspecified: Secondary | ICD-10-CM | POA: Diagnosis present

## 2024-09-26 DIAGNOSIS — K5730 Diverticulosis of large intestine without perforation or abscess without bleeding: Secondary | ICD-10-CM | POA: Diagnosis present

## 2024-09-26 DIAGNOSIS — Z1152 Encounter for screening for COVID-19: Secondary | ICD-10-CM

## 2024-09-26 DIAGNOSIS — B9789 Other viral agents as the cause of diseases classified elsewhere: Secondary | ICD-10-CM | POA: Diagnosis present

## 2024-09-26 DIAGNOSIS — R600 Localized edema: Secondary | ICD-10-CM | POA: Insufficient documentation

## 2024-09-26 DIAGNOSIS — F03918 Unspecified dementia, unspecified severity, with other behavioral disturbance: Secondary | ICD-10-CM | POA: Insufficient documentation

## 2024-09-26 DIAGNOSIS — C61 Malignant neoplasm of prostate: Secondary | ICD-10-CM | POA: Diagnosis present

## 2024-09-26 DIAGNOSIS — T461X5A Adverse effect of calcium-channel blockers, initial encounter: Secondary | ICD-10-CM | POA: Diagnosis not present

## 2024-09-26 DIAGNOSIS — F02818 Dementia in other diseases classified elsewhere, unspecified severity, with other behavioral disturbance: Secondary | ICD-10-CM | POA: Diagnosis present

## 2024-09-26 DIAGNOSIS — Z9183 Wandering in diseases classified elsewhere: Secondary | ICD-10-CM

## 2024-09-26 DIAGNOSIS — Z66 Do not resuscitate: Secondary | ICD-10-CM | POA: Diagnosis present

## 2024-09-26 DIAGNOSIS — Z515 Encounter for palliative care: Secondary | ICD-10-CM

## 2024-09-26 DIAGNOSIS — K921 Melena: Secondary | ICD-10-CM | POA: Diagnosis not present

## 2024-09-26 DIAGNOSIS — G928 Other toxic encephalopathy: Principal | ICD-10-CM | POA: Diagnosis present

## 2024-09-26 DIAGNOSIS — I2489 Other forms of acute ischemic heart disease: Secondary | ICD-10-CM | POA: Diagnosis present

## 2024-09-26 DIAGNOSIS — D62 Acute posthemorrhagic anemia: Secondary | ICD-10-CM | POA: Diagnosis not present

## 2024-09-26 LAB — COMPREHENSIVE METABOLIC PANEL WITH GFR
ALT: 20 U/L (ref 0–44)
AST: 31 U/L (ref 15–41)
Albumin: 3.4 g/dL — ABNORMAL LOW (ref 3.5–5.0)
Alkaline Phosphatase: 111 U/L (ref 38–126)
Anion gap: 10 (ref 5–15)
BUN: 43 mg/dL — ABNORMAL HIGH (ref 8–23)
CO2: 27 mmol/L (ref 22–32)
Calcium: 8.8 mg/dL — ABNORMAL LOW (ref 8.9–10.3)
Chloride: 106 mmol/L (ref 98–111)
Creatinine, Ser: 2.82 mg/dL — ABNORMAL HIGH (ref 0.61–1.24)
GFR, Estimated: 22 mL/min — ABNORMAL LOW
Glucose, Bld: 80 mg/dL (ref 70–99)
Potassium: 4.3 mmol/L (ref 3.5–5.1)
Sodium: 143 mmol/L (ref 135–145)
Total Bilirubin: 0.4 mg/dL (ref 0.0–1.2)
Total Protein: 6.1 g/dL — ABNORMAL LOW (ref 6.5–8.1)

## 2024-09-26 LAB — CBC WITH DIFFERENTIAL/PLATELET
Abs Immature Granulocytes: 0.03 K/uL (ref 0.00–0.07)
Basophils Absolute: 0 K/uL (ref 0.0–0.1)
Basophils Relative: 1 %
Eosinophils Absolute: 0.1 K/uL (ref 0.0–0.5)
Eosinophils Relative: 2 %
HCT: 30.5 % — ABNORMAL LOW (ref 39.0–52.0)
Hemoglobin: 9.2 g/dL — ABNORMAL LOW (ref 13.0–17.0)
Immature Granulocytes: 1 %
Lymphocytes Relative: 11 %
Lymphs Abs: 0.6 K/uL — ABNORMAL LOW (ref 0.7–4.0)
MCH: 30.1 pg (ref 26.0–34.0)
MCHC: 30.2 g/dL (ref 30.0–36.0)
MCV: 99.7 fL (ref 80.0–100.0)
Monocytes Absolute: 0.8 K/uL (ref 0.1–1.0)
Monocytes Relative: 14 %
Neutro Abs: 4.1 K/uL (ref 1.7–7.7)
Neutrophils Relative %: 71 %
Platelets: 238 K/uL (ref 150–400)
RBC: 3.06 MIL/uL — ABNORMAL LOW (ref 4.22–5.81)
RDW: 14 % (ref 11.5–15.5)
WBC: 5.6 K/uL (ref 4.0–10.5)
nRBC: 0 % (ref 0.0–0.2)

## 2024-09-26 LAB — BLOOD GAS, VENOUS
Acid-Base Excess: 1.5 mmol/L (ref 0.0–2.0)
Bicarbonate: 28.5 mmol/L — ABNORMAL HIGH (ref 20.0–28.0)
O2 Saturation: 54.5 %
Patient temperature: 37
pCO2, Ven: 54 mmHg (ref 44–60)
pH, Ven: 7.33 (ref 7.25–7.43)
pO2, Ven: 33 mmHg (ref 32–45)

## 2024-09-26 LAB — CBC
HCT: 28.7 % — ABNORMAL LOW (ref 39.0–52.0)
Hemoglobin: 8.6 g/dL — ABNORMAL LOW (ref 13.0–17.0)
MCH: 29.8 pg (ref 26.0–34.0)
MCHC: 30 g/dL (ref 30.0–36.0)
MCV: 99.3 fL (ref 80.0–100.0)
Platelets: 193 K/uL (ref 150–400)
RBC: 2.89 MIL/uL — ABNORMAL LOW (ref 4.22–5.81)
RDW: 13.8 % (ref 11.5–15.5)
WBC: 4.7 K/uL (ref 4.0–10.5)
nRBC: 0 % (ref 0.0–0.2)

## 2024-09-26 LAB — RESP PANEL BY RT-PCR (RSV, FLU A&B, COVID)  RVPGX2
Influenza A by PCR: NEGATIVE
Influenza B by PCR: NEGATIVE
Resp Syncytial Virus by PCR: NEGATIVE
SARS Coronavirus 2 by RT PCR: NEGATIVE

## 2024-09-26 LAB — ACETAMINOPHEN LEVEL: Acetaminophen (Tylenol), Serum: <10 ug/mL — ABNORMAL LOW (ref 10–30)

## 2024-09-26 LAB — CK: Total CK: 122 U/L (ref 49–397)

## 2024-09-26 LAB — SALICYLATE LEVEL: Salicylate Lvl: <7 mg/dL — ABNORMAL LOW (ref 7.0–30.0)

## 2024-09-26 LAB — VITAMIN B12: Vitamin B-12: 864 pg/mL (ref 180–914)

## 2024-09-26 LAB — CREATININE, SERUM
Creatinine, Ser: 2.61 mg/dL — ABNORMAL HIGH (ref 0.61–1.24)
GFR, Estimated: 24 mL/min — ABNORMAL LOW

## 2024-09-26 LAB — TROPONIN T, HIGH SENSITIVITY
Troponin T High Sensitivity: 64 ng/L — ABNORMAL HIGH (ref 0–19)
Troponin T High Sensitivity: 59 ng/L — ABNORMAL HIGH (ref 0–19)

## 2024-09-26 LAB — TSH: TSH: 0.885 u[IU]/mL (ref 0.350–4.500)

## 2024-09-26 LAB — PROCALCITONIN: Procalcitonin: 0.19 ng/mL

## 2024-09-26 LAB — AMMONIA: Ammonia: 24 umol/L (ref 9–35)

## 2024-09-26 LAB — LACTIC ACID, PLASMA
Lactic Acid, Venous: 1.4 mmol/L (ref 0.5–1.9)
Lactic Acid, Venous: 2.3 mmol/L (ref 0.5–1.9)
Lactic Acid, Venous: 1 mmol/L (ref 0.5–1.9)

## 2024-09-26 LAB — ETHANOL: Alcohol, Ethyl (B): <15 mg/dL

## 2024-09-26 LAB — D-DIMER, QUANTITATIVE: D-Dimer, Quant: 2.49 ug{FEU}/mL — ABNORMAL HIGH (ref 0.00–0.50)

## 2024-09-26 MED ORDER — ONDANSETRON HCL 4 MG PO TABS
4.0000 mg | ORAL_TABLET | Freq: Four times a day (QID) | ORAL | Status: DC | PRN
  Filled 2024-09-26 (×2): qty 1

## 2024-09-26 MED ORDER — GERHARDT'S BUTT CREAM
1.0000 | TOPICAL_CREAM | Freq: Three times a day (TID) | CUTANEOUS | Status: DC
  Administered 2024-09-27 – 2024-10-02 (×15): 1 via TOPICAL
  Filled 2024-09-26: qty 60

## 2024-09-26 MED ORDER — PRAVASTATIN SODIUM 20 MG PO TABS
40.0000 mg | ORAL_TABLET | Freq: Every day | ORAL | Status: DC
  Administered 2024-09-27 – 2024-10-01 (×5): 40 mg via ORAL
  Filled 2024-09-26 (×5): qty 2

## 2024-09-26 MED ORDER — ALLOPURINOL 100 MG PO TABS
100.0000 mg | ORAL_TABLET | Freq: Every day | ORAL | Status: DC
  Administered 2024-09-27 – 2024-10-01 (×4): 100 mg via ORAL
  Filled 2024-09-26 (×4): qty 1

## 2024-09-26 MED ORDER — ALBUTEROL SULFATE (2.5 MG/3ML) 0.083% IN NEBU
2.5000 mg | INHALATION_SOLUTION | RESPIRATORY_TRACT | Status: AC
  Administered 2024-09-26 (×2): 2.5 mg via RESPIRATORY_TRACT
  Filled 2024-09-26 (×2): qty 3

## 2024-09-26 MED ORDER — LACTATED RINGERS IV SOLN: INTRAVENOUS | Status: AC

## 2024-09-26 MED ORDER — ALBUTEROL SULFATE (2.5 MG/3ML) 0.083% IN NEBU
2.5000 mg | INHALATION_SOLUTION | RESPIRATORY_TRACT | Status: DC | PRN
  Administered 2024-09-26 – 2024-09-29 (×3): 2.5 mg via RESPIRATORY_TRACT
  Filled 2024-09-26 (×2): qty 3

## 2024-09-26 MED ORDER — ENOXAPARIN SODIUM 30 MG/0.3ML IJ SOSY
30.0000 mg | PREFILLED_SYRINGE | INTRAMUSCULAR | Status: DC
  Administered 2024-09-26: 30 mg via SUBCUTANEOUS
  Filled 2024-09-26: qty 0.3

## 2024-09-26 MED ORDER — SODIUM CHLORIDE 0.9 % IV BOLUS
1000.0000 mL | Freq: Once | INTRAVENOUS | Status: AC
  Administered 2024-09-26: 1000 mL via INTRAVENOUS

## 2024-09-26 MED ORDER — VITAMIN B-12 1000 MCG PO TABS
1000.0000 ug | ORAL_TABLET | Freq: Every day | ORAL | Status: DC
  Administered 2024-09-27 – 2024-10-01 (×4): 1000 ug via ORAL
  Filled 2024-09-26 (×4): qty 1

## 2024-09-26 MED ORDER — FOLIC ACID 1 MG PO TABS
1.0000 mg | ORAL_TABLET | Freq: Every day | ORAL | Status: DC
  Administered 2024-09-27 – 2024-10-01 (×4): 1 mg via ORAL
  Filled 2024-09-26 (×4): qty 1

## 2024-09-26 MED ORDER — ONDANSETRON HCL 4 MG/2ML IJ SOLN: 4.0000 mg | Freq: Four times a day (QID) | INTRAMUSCULAR | Status: DC | PRN

## 2024-09-26 MED ORDER — ACETAMINOPHEN 650 MG RE SUPP: 650.0000 mg | Freq: Four times a day (QID) | RECTAL | Status: DC | PRN

## 2024-09-26 MED ORDER — SENNOSIDES-DOCUSATE SODIUM 8.6-50 MG PO TABS: 1.0000 | ORAL_TABLET | Freq: Every evening | ORAL | Status: DC | PRN

## 2024-09-26 MED ORDER — ACETAMINOPHEN 325 MG PO TABS
650.0000 mg | ORAL_TABLET | Freq: Four times a day (QID) | ORAL | Status: DC | PRN
  Administered 2024-09-28 – 2024-09-29 (×2): 650 mg via ORAL
  Filled 2024-09-26 (×2): qty 2

## 2024-09-26 NOTE — Assessment & Plan Note (Signed)
 Gastroesophageal reflux disease Continue metoprolol  20 mg daily

## 2024-09-26 NOTE — Assessment & Plan Note (Signed)
 Hypertension Currently hypotensive likely secondary to volume as well as his baclofen  reducing vascular tone Systolic blood pressure hovering around 100s, holding patient's hydralazine  25 mg twice daily

## 2024-09-26 NOTE — Assessment & Plan Note (Signed)
 Lower extremity edema Significantly improved as per wife likely multifactorial specially with his reduced oncotic pressure with lowalbumin we will obtain a D-dimer to rule out any DVT causing and echocardiogram to rule out cardiac cause for his dependent edema  D-Dimer Elevated 2.49 We will start with LE doppler given the AKI on CKD

## 2024-09-26 NOTE — Progress Notes (Signed)
 MD Mansy notified that lactate result is 2.3. Tele sitter being ordered for pt safety.

## 2024-09-26 NOTE — Assessment & Plan Note (Signed)
 Gout No evidence of acute flare we will continue allopurinol  100 mg daily

## 2024-09-26 NOTE — Assessment & Plan Note (Signed)
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

## 2024-09-26 NOTE — Assessment & Plan Note (Signed)
" °  Dementia with behavioral disturbances Hold donepezil  23 mg daily given the patient's bradycardia Trazodone , typically takes for sundowning and behavioral disturbances at nighttime holding given sedation "

## 2024-09-26 NOTE — ED Notes (Signed)
 RN gave 1600 Douneb, accidentally charted PRN order.

## 2024-09-26 NOTE — Progress Notes (Deleted)
 Good evening, I am ordering telesitter for this pt. Wife states he sundowns really bad and climbs out of bed. His lactate also came back at 2.3, he is currently getting LR at 75 ml/hr

## 2024-09-26 NOTE — ED Provider Notes (Signed)
 "  [facility] Provider Note    Event Date/Time   First MD Initiated Contact with Patient 09/26/24 1249     (approximate)   History   Altered Mental Status   HPI  Jason Estrada is a 84 y.o. male with history of dementia, hypertension, CKD, chronic pain, presenting with mental status change.  Per independent history from EMS, he was found unresponsive, when fire department got there first, he was noted to be satting 81% on room air, pinpoint pupils, they gave him 2 mg of intranasal Narcan.  When EMS got there, they placed an IV and gave him 2 mg of IV Narcan with improvement to his respirations.  Patient denies taking any medications.  EMS states that he is on Seroquel  and may have been taking Mucinex  for congestion but no known opiate use.  On independent chart review, he was admitted in December for sepsis secondary to olecranon bursitis.  Is on baclofen  and trazodone .  Independent history from wife, he was fine and ate breakfast at 9:00.  Was tired and went to lay down which is typical for him.  Wife states that she gave him only Mucinex  this morning, it was Mucinex  relief that contain guaifenesin  and not dextromethorphan.  He did not take any other medications.  States that she had trouble waking him up and called 911.  He has been having a cough for about 5 days.  States that he needs to be able to around has no access to taking medications by himself.     Physical Exam   Triage Vital Signs: ED Triage Vitals  Encounter Vitals Group     BP      Girls Systolic BP Percentile      Girls Diastolic BP Percentile      Boys Systolic BP Percentile      Boys Diastolic BP Percentile      Pulse      Resp      Temp      Temp src      SpO2      Weight      Height      Head Circumference      Peak Flow      Pain Score      Pain Loc      Pain Education      Exclude from Growth Chart     Most recent vital signs: Vitals:   09/26/24 1310  BP: 100/66  Pulse:  (!) 56  Resp: 16  Temp: 98.5 F (36.9 C)  SpO2: 90%     General: Awake, no distress.  CV:  Good peripheral perfusion.  Resp:  Normal effort.  No tachypnea or respiratory distress Abd:  No distention.  Soft nontender Other:  Pupils are small but reactive and equal, no focal weakness or numbness, no facial droop   ED Results / Procedures / Treatments   Labs (all labs ordered are listed, but only abnormal results are displayed) Labs Reviewed  COMPREHENSIVE METABOLIC PANEL WITH GFR - Abnormal; Notable for the following components:      Result Value   BUN 43 (*)    Creatinine, Ser 2.82 (*)    Calcium 8.8 (*)    Total Protein 6.1 (*)    Albumin 3.4 (*)    GFR, Estimated 22 (*)    All other components within normal limits  CBC WITH DIFFERENTIAL/PLATELET - Abnormal; Notable for the following components:   RBC 3.06 (*)  Hemoglobin 9.2 (*)    HCT 30.5 (*)    Lymphs Abs 0.6 (*)    All other components within normal limits  TROPONIN T, HIGH SENSITIVITY - Abnormal; Notable for the following components:   Troponin T High Sensitivity 64 (*)    All other components within normal limits  RESP PANEL BY RT-PCR (RSV, FLU A&B, COVID)  RVPGX2  ETHANOL  URINE DRUG SCREEN  URINALYSIS, W/ REFLEX TO CULTURE (INFECTION SUSPECTED)  SALICYLATE LEVEL  ACETAMINOPHEN  LEVEL     EKG  EKG shows, sinus bradycardia with sinus arrhythmia, rate of 46, normal QRS, normal QTc, no obvious ischemic ST elevation, sinus bradycardia appears new.   RADIOLOGY On my independent interpretation, CT head without obvious intracranial hemorrhage   PROCEDURES:  Critical Care performed: No  Procedures   MEDICATIONS ORDERED IN ED: Medications  sodium chloride  0.9 % bolus 1,000 mL (has no administration in time range)     IMPRESSION / MDM / ASSESSMENT AND PLAN / ED COURSE  I reviewed the triage vital signs and the nursing notes.                              Differential diagnosis includes, but is  not limited to, ?opiate overdose, ingestion, aspiration, arrhythmia, electrolyte derangements, pneumonia, viral illness, electrolyte derangements.  Get labs, EKG, CT head, chest x-ray.  Will monitor him here in the ED.  He satting high 90s on room air, blood pressures are stable.  Patient's presentation is most consistent with acute presentation with potential threat to life or bodily function.  Independent interpretation of labs and imaging below.  Are notable for an AKI, will give him some fluids at this time.  He remains awake and alert.  Heart rate has been in the 50s.  He is on verapamil , although wife states that he could not have overdosed on any of his medications since he does not have access to them.  Given his mental status change earlier, AKI, he will need to be admitted for further management.  Consulted hospitalist who will evaluate the patient.  He is admitted.  The patient is on the cardiac monitor to evaluate for evidence of arrhythmia and/or significant heart rate changes.   Clinical Course as of 09/26/24 1434  Sun Sep 26, 2024  1407 CT Head Wo Contrast IMPRESSION: No acute intracranial abnormality.  Stable cerebral atrophy and chronic small vessel disease.   [TT]  1408 DG Chest 1 View Low lung volumes with mild left basilar atelectasis.  [TT]  1413 Independent review of labs, troponins mildly elevated, electrolytes not severely deranged, he has an AKI that is worse compared to prior, no leukocytosis. [TT]  1433 Tylenol  and salicylate levels are not elevated. [TT]    Clinical Course User Index [TT] Waymond Lorelle Cummins, MD     FINAL CLINICAL IMPRESSION(S) / ED DIAGNOSES   Final diagnoses:  Altered mental status, unspecified altered mental status type  AKI (acute kidney injury)  Bradycardia     Rx / DC Orders   ED Discharge Orders     None        Note:  This document was prepared using Dragon voice recognition software and may include unintentional dictation  errors.    Waymond Lorelle Cummins, MD 09/26/24 1418  "

## 2024-09-26 NOTE — Assessment & Plan Note (Signed)
" °  Hyperlipidemia Continue pravastatin  40 mg daily  "

## 2024-09-26 NOTE — Assessment & Plan Note (Signed)
 Cough WBC 5.6 chest x-ray clear except some basilar atelectasis wheezes present We will administer albuterol  obtain a Pro-Cal to ensure no secondary bacterial infection and will atelectasis obtain a respiratory panel

## 2024-09-26 NOTE — Assessment & Plan Note (Signed)
" °  Chronic normocytic anemia Hemoglobin 9.2 close to baseline Osmach can have anemia from his chronic kidney disease will monitor "

## 2024-09-26 NOTE — Assessment & Plan Note (Signed)
 Toxic encephalopathy acute, resolving secondary to baclofen  Suspected this is secondary to baclofen  accumulation due to his AKI on CKD other differential would be symptomatic bradycardia CT head was negative for any acute changes, UDS is pending We will obtain a TSH as well as B12 and ammonia and RPR, hold baclofen  Serial neurological checks ordered

## 2024-09-26 NOTE — Assessment & Plan Note (Signed)
 Acute kidney injury on chronic kidney disease CO2 27 creatinine 2.82 up from 1.7 December 11 Suspect this is likely multifactorial with decreased intake with recent cough oversedation at the nursing home as well as potentially overdiuresis Plan: Administering crystalloid IV obtaining a lactic acid VBG CPK levels as well as retroperitoneal ultrasound to rule out any obstruction, serial studies, UA

## 2024-09-26 NOTE — Progress Notes (Deleted)
 See H& P.

## 2024-09-26 NOTE — H&P (Addendum)
 " History and Physical    Patient: Jason Estrada FMW:[id number] DOB: [birth year] DOA: 09/26/2024 DOS: the patient was seen and examined on 09/26/2024 PCP: [name], NP  Patient coming from: Home    Chief Complaint:  Chief Complaint  Patient presents with   Altered Mental Status   HPI:  Mr. Jason Estrada is a 84 year old gentleman with a past medical history of dementia, prostate cancer gastroesophageal reflux disease, hypertension, chronic kidney disease stage IIIb and recent hospitalization for sepsis secondary to olecranon bursitis discharged 08/26/2024 who presented to the emergency department with mental status change presenting as inability to be aroused and somnolence.  The patient's wife [name] at the bedside provides most of the history.  She reports the patient was recently discharged from inpatient rehab after a 30-day short-term rehabilitation stay.  The patient was noted to have over the last 4 to 5 days a cough but continue to eat and drink normally as per the spouse.  She had been bringing in protein drinks and on the day of discharge from the rehab yesterday the patient had an episode of sundowning and confusion requiring extra time to arouse him.  Of note the patient had issues with inability to function with rehabilitation well at the facility due to the fact he was given Seroquel  and was overly sedated.  When the patient's home health care nurse was unable to arouse him this morning the activated EMS.  Of note the patient was reportedly bradycardic in the 40s with systolic pressure in the 100s when EMS arrived with pinpoint pupils as well as desaturation and reportedly responded well to Narcan although the patient's wife reports no opiates in the house and the patient is dependent on her for medications.  The patient had also been receiving aggressive diuresis reportedly with Lasix  for dependent edema which patient's wife reports is significantly improved now.  Patient is confused at  baseline although the patient's wife believes he is now at his mental baseline.  Patient's wife cannot recall any past echocardiograms or diagnosis of heart failure  Case discussed with emergency department provider and patient subsequently placed in admission.  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 This was completed in June 2023, and initial ADT injection was given 12/10/2021.    Sinus tachycardia 09/26/2021   Vitamin B12 deficiency 02/18/2023   229 December 2023   Past Surgical History:  Procedure Laterality Date   CHOLECYSTECTOMY     Social History:  reports that he has never smoked. He has never been exposed to tobacco smoke. He has never used smokeless tobacco. He reports current alcohol use of about 7.0 - 14.0 standard drinks of alcohol per  week. He reports that he does not currently use drugs.  Allergies[1]  History reviewed. No pertinent family history.  Prior to Admission medications  Medication Sig Start Date End Date Taking? Authorizing Provider  acetaminophen  (TYLENOL ) 325 MG tablet Take 2 tablets (650 mg  total) by mouth every 6 (six) hours as needed for mild pain (pain score 1-3) or fever (or Fever >/= 101). 08/26/24   Franchot Novel, MD  allopurinol  (ZYLOPRIM ) 100 MG tablet Take 1 tablet (100 mg total) by mouth daily. 08/21/23   Cannady, Jolene T, NP  baclofen  (LIORESAL ) 10 MG tablet Take 0.5 tablets (5 mg total) by mouth daily as needed. 08/26/24   Franchot Novel, MD  [Paused] colchicine  0.6 MG tablet TAKE 1 TABLET BY MOUTH EVERY DAY Wait to take this until your doctor or other care provider tells you to start again. 07/01/24   Cannady, Jolene T, NP  cyanocobalamin  (VITAMIN B12) 1000 MCG tablet Take 1,000 mcg by mouth daily.    [provider]  donepezil  (ARICEPT ) 23 MG TABS tablet TAKE 1 TABLET (23 MG TOTAL) BY MOUTH DAILY. 09/01/24   Wertman, Sara E, PA-C  folic acid  (FOLVITE ) 1 MG tablet TAKE 1 TABLET BY MOUTH EVERY DAY 08/18/24   Cannady, Jolene T, NP  hydrALAZINE  (APRESOLINE ) 50 MG tablet Take 0.5 tablets (25 mg total) by mouth in the morning and at bedtime. Patient taking differently: Take 50 mg by mouth daily. 08/21/23   Cannady, Jolene T, NP  memantine  (NAMENDA ) 10 MG tablet TAKE 1 TABLET BY MOUTH TWICE A DAY 09/17/24   Dina, Sara E, PA-C  Nystatin ([name]) CREA Apply 1 Application topically 3 (three) times daily. 08/26/24   Franchot Novel, MD  pantoprazole  (PROTONIX ) 20 MG tablet Take 1 tablet (20 mg total) by mouth daily. 08/21/23   Cannady, Jolene T, NP  pravastatin  (PRAVACHOL ) 40 MG tablet TAKE 1 TABLET BY MOUTH EVERY DAY 09/08/24   Cannady, Jolene T, NP  traZODone  (DESYREL ) 50 MG tablet TAKE HALF TABLET AT NIGHT, MAY INCREASE TO ONE FULL TABLET IF NEEDED 03/23/24   Dina, Camie BRAVO, PA-C  verapamil  (CALAN -SR) 240 MG CR tablet TAKE 1 TABLET BY MOUTH EVERY DAY 09/17/24   Cannady, Jolene T, NP    Physical Exam: Vitals:   09/26/24 1308 09/26/24 1310  BP:  100/66  Pulse:  (!) 56  Resp:  16  Temp:  98.5 F (36.9 C)  TempSrc:  Oral  SpO2:  90%  Weight:  85.3 kg   Height: 6' (1.829 m)   Constitutional:  Vital Signs as per [facility] than three noted] No Acute Distress Eyes:  Pink Conjunctiva and no Ptosis Neck:     Trachea Midline, Neck Symmetric             Thyroid without tenderness, palpable masses or nodules Respiratory:   Respiratory Effort Normal: No Use of Respiratory Muscles,No  Intercostal Retractions             Lungs scattered wheeze to Auscultation Bilaterally Cardiovascular:   Heart Auscultated: Regular Regular without any added sounds or murmurs              No Lower Extremity Edema Gastrointestinal:  Abdomen soft and nontender without palpable masses, guarding or rebound  No Palpable Splenomegaly or Hepatomegaly Psychiatric:  Patient Not Orientated to Time, Place and Person Patient with blunted mood and affect Recent and Remote Memory Impaired Opens eyes to verbal stimulation, not oriented at baseline Wife believes approaching baseline All  joints no evidence of inflammation   Data Reviewed: Labs, Radiology, ECG as detailed in HPI and A/P   Assessment and Plan: * Toxic encephalopathy [Adverse Effect not Poisoning]  Toxic encephalopathy acute, resolving secondary to baclofen  Suspected this is secondary to baclofen  accumulation due to his AKI on CKD other differential would be symptomatic bradycardia CT head was negative for any acute changes, UDS is pending We will obtain a TSH as well as B12 and ammonia and RPR, hold baclofen  Serial neurological checks ordered   Bradycardia  Bradycardia Twelve-lead ECG independently reviewed and interpreted patient is a ventricular rate of 46 PR interval 258 QTc of 428 patient has a first-degree progressing to a second-degree heart block there is different PR intervals present but no dropped P waves in the current strip Likely multifactorial with the fact the patient is on calcium channel blockers, acetylcholinesterase inhibitors as well as baclofen  in the setting of AKI,  given heart rate is increased to 60s and is alert and nontoxic currently we will hold off atropine Plan: Telemetry, hold calcium channel blockers parasympathetic agents and his acetylcholinesterase inhibitor, low threshold for atropine if progresses or does not improve with continued conservative measures   Gout Gout No evidence of acute flare we will continue allopurinol  100 mg daily   AKI (acute kidney injury) Acute kidney injury on chronic kidney disease CO2 27 creatinine 2.82 up from 1.7 December 11 Suspect this is likely multifactorial with decreased intake with recent cough oversedation at the nursing home as well as potentially overdiuresis Plan: Administering crystalloid IV obtaining a lactic acid VBG CPK levels as well as retroperitoneal ultrasound to rule out any obstruction, serial studies, UA  Lower extremity edema Lower extremity edema Significantly improved as per wife likely multifactorial specially with his reduced oncotic pressure with lowalbumin we will obtain a D-dimer to rule out any DVT causing and echocardiogram to rule out cardiac cause for his dependent edema  D-Dimer Elevated 2.49 We will start with LE doppler given the AKI on CKD  Anemia due to chronic kidney disease  Chronic normocytic anemia Hemoglobin 9.2 close to baseline Osmach can have anemia from his chronic kidney disease will monitor  Troponin level elevated Troponin elevation 64 suspect this is secondary to the AKI and CKD patient denies any chest pain we will follow  Cough Cough WBC 5.6 chest x-ray clear except some basilar atelectasis wheezes present We will administer albuterol  obtain a Pro-Cal to ensure no secondary bacterial infection and will atelectasis obtain a respiratory panel  Dementia with behavioral disturbance (HCC)  Dementia with behavioral disturbances Hold donepezil  23 mg daily given the patient's bradycardia Trazodone , typically takes for sundowning and behavioral  disturbances at nighttime holding given sedation  GERD (gastroesophageal reflux disease) Gastroesophageal reflux disease Continue metoprolol  20 mg daily  Prostate cancer Prostate cancer As per the wife the patient has had low PSA recently and a good outcome Obtaining retroperitoneal ultrasound to rule out any hydronephrosis secondary to this  HLD (hyperlipidemia)  Hyperlipidemia Continue pravastatin  40 mg daily   HTN (hypertension) Hypertension Currently hypotensive likely secondary to volume as well as his baclofen  reducing vascular tone Systolic blood pressure hovering around 100s, holding patient's hydralazine  25 mg twice daily     Advance Care Planning: DNR/DNI as per wife (husband cannot participate secondary to dementia advanced)  Consults: N/A  Family Communication: wife at bedside  Severity of Illness: The appropriate patient status for this patient is INPATIENT. Inpatient status is judged to be reasonable and necessary  in order to provide the required intensity of service to ensure the patient's safety. The patient's presenting symptoms, physical exam findings, and initial radiographic and laboratory data in the context of their chronic comorbidities is felt to place them at high risk for further clinical deterioration. Furthermore, it is not anticipated that the patient will be medically stable for discharge from the hospital within 2 midnights of admission.   * I certify that at the point of admission it is my clinical judgment that the patient will require inpatient hospital care spanning beyond 2 midnights from the point of admission due to high intensity of service, high risk for further deterioration and high frequency of surveillance required.*  Author: Prentice JAYSON Lowenstein, MD 09/26/2024 5:28 PM  For on call review [email or web address].      [1] No Known Allergies  "

## 2024-09-26 NOTE — ED Triage Notes (Signed)
 Presents via EMS from home  Per wife  he became less responsive  States he ate breakfast   then sat in his chair   Then she had a hard time waking him

## 2024-09-26 NOTE — Assessment & Plan Note (Signed)
 Prostate cancer As per the wife the patient has had low PSA recently and a good outcome Obtaining retroperitoneal ultrasound to rule out any hydronephrosis secondary to this

## 2024-09-26 NOTE — Assessment & Plan Note (Signed)
 Troponin elevation 64 suspect this is secondary to the AKI and CKD patient denies any chest pain we will follow

## 2024-09-27 ENCOUNTER — Inpatient Hospital Stay

## 2024-09-27 ENCOUNTER — Inpatient Hospital Stay: Admit: 2024-09-27 | Discharge: 2024-09-27 | Disposition: A | Attending: Internal Medicine

## 2024-09-27 DIAGNOSIS — G929 Unspecified toxic encephalopathy: Secondary | ICD-10-CM

## 2024-09-27 LAB — RESPIRATORY PANEL BY PCR

## 2024-09-27 LAB — ECHOCARDIOGRAM COMPLETE
AR max vel: 3.14 cm2
AV Area VTI: 3.96 cm2
AV Area mean vel: 3.16 cm2
AV Mean grad: 5 mmHg
AV Peak grad: 8.8 mmHg
Ao pk vel: 1.48 m/s
Area-P 1/2: 3.27 cm2
Height: 72 in
MV VTI: 2.91 cm2
S' Lateral: 3 cm
Weight: 3008.84 [oz_av]

## 2024-09-27 LAB — COMPREHENSIVE METABOLIC PANEL WITH GFR
ALT: 21 U/L (ref 0–44)
AST: 29 U/L (ref 15–41)
Albumin: 3.2 g/dL — ABNORMAL LOW (ref 3.5–5.0)
Alkaline Phosphatase: 113 U/L (ref 38–126)
Anion gap: 7 (ref 5–15)
BUN: 29 mg/dL — ABNORMAL HIGH (ref 8–23)
CO2: 25 mmol/L (ref 22–32)
Calcium: 9 mg/dL (ref 8.9–10.3)
Chloride: 111 mmol/L (ref 98–111)
Creatinine, Ser: 1.88 mg/dL — ABNORMAL HIGH (ref 0.61–1.24)
GFR, Estimated: 35 mL/min — ABNORMAL LOW
Glucose, Bld: 98 mg/dL (ref 70–99)
Potassium: 4.3 mmol/L (ref 3.5–5.1)
Sodium: 144 mmol/L (ref 135–145)
Total Bilirubin: 0.4 mg/dL (ref 0.0–1.2)
Total Protein: 5.8 g/dL — ABNORMAL LOW (ref 6.5–8.1)

## 2024-09-27 LAB — CBC
HCT: 30.6 % — ABNORMAL LOW (ref 39.0–52.0)
Hemoglobin: 9.4 g/dL — ABNORMAL LOW (ref 13.0–17.0)
MCH: 30 pg (ref 26.0–34.0)
MCHC: 30.7 g/dL (ref 30.0–36.0)
MCV: 97.8 fL (ref 80.0–100.0)
Platelets: 224 K/uL (ref 150–400)
RBC: 3.13 MIL/uL — ABNORMAL LOW (ref 4.22–5.81)
RDW: 13.8 % (ref 11.5–15.5)
WBC: 6.6 K/uL (ref 4.0–10.5)
nRBC: 0 % (ref 0.0–0.2)

## 2024-09-27 LAB — HIV ANTIBODY (ROUTINE TESTING W REFLEX): HIV Screen 4th Generation wRfx: NONREACTIVE

## 2024-09-27 LAB — SYPHILIS: RPR W/REFLEX TO RPR TITER AND TREPONEMAL ANTIBODIES, TRADITIONAL SCREENING AND DIAGNOSIS ALGORITHM: RPR Ser Ql: NONREACTIVE

## 2024-09-27 MED ORDER — IOHEXOL 350 MG/ML SOLN
100.0000 mL | Freq: Once | INTRAVENOUS | Status: AC | PRN
  Administered 2024-09-27: 100 mL via INTRAVENOUS

## 2024-09-27 MED ORDER — HYDRALAZINE HCL 20 MG/ML IJ SOLN
20.0000 mg | Freq: Four times a day (QID) | INTRAMUSCULAR | Status: DC | PRN
  Administered 2024-09-27 – 2024-09-28 (×2): 20 mg via INTRAVENOUS
  Filled 2024-09-27 (×2): qty 1

## 2024-09-27 MED ORDER — ENOXAPARIN SODIUM 40 MG/0.4ML IJ SOSY
40.0000 mg | PREFILLED_SYRINGE | INTRAMUSCULAR | Status: DC
  Administered 2024-09-28 – 2024-10-01 (×4): 40 mg via SUBCUTANEOUS
  Filled 2024-09-27 (×4): qty 0.4

## 2024-09-27 MED ORDER — QUETIAPINE FUMARATE 25 MG PO TABS
25.0000 mg | ORAL_TABLET | Freq: Two times a day (BID) | ORAL | Status: DC | PRN
  Administered 2024-09-27 (×2): 25 mg via ORAL
  Filled 2024-09-27 (×2): qty 1

## 2024-09-27 MED ORDER — SODIUM CHLORIDE 0.9 % IV SOLN: INTRAVENOUS | Status: DC

## 2024-09-27 MED ORDER — HALOPERIDOL LACTATE 5 MG/ML IJ SOLN
5.0000 mg | INTRAMUSCULAR | Status: AC
  Administered 2024-09-27: 5 mg via INTRAMUSCULAR

## 2024-09-27 MED ORDER — HALOPERIDOL LACTATE 5 MG/ML IJ SOLN
5.0000 mg | Freq: Four times a day (QID) | INTRAMUSCULAR | Status: DC | PRN
  Administered 2024-09-28 – 2024-09-30 (×2): 5 mg via INTRAMUSCULAR
  Filled 2024-09-27 (×3): qty 1

## 2024-09-27 NOTE — Progress Notes (Signed)
 " PROGRESS NOTE    Jason Estrada  FMW:[id number] DOB: Jul 30, 1941 DOA: 09/26/2024 PCP: [name], NP   Assessment & Plan:   Principal Problem:   Toxic encephalopathy Active Problems:   Gout   Bradycardia   AKI (acute kidney injury)   HTN (hypertension)   HLD (hyperlipidemia)   Prostate cancer   GERD (gastroesophageal reflux disease)   Dementia with behavioral disturbance (HCC)   Cough   Troponin level elevated   Anemia due to chronic kidney disease   Lower extremity edema  Assessment and Plan: Toxic encephalopathy: etiology unclear, possibly baclofen  accumulation. CT head showed no acute intracranial abnormalities. Continue w/ neuro checks   Bradycardia: etiology unclear. Holding home dose of verapamil .    Gout: continue on home dose of allopurinol    AKI on CKDIV: Cr is trending down from day prior. Avoid nephrotoxic meds    Lower extremity edema: US  of b/l LE was neg. CTA chest ordered b/c d-dimer was elevated. Echo is pending   ACD: likely secondary to CKD. No need for a transfusion currently   Elevated troponins: trending down. Likely secondary to demand ischemia   Cough: etiology unclear. Viral respiratory PCR panel is pending.    Dementia: with behavioral disturbance. Holding donepezil  secondary to bradycardia.    GERD: continue on PPI    Prostate cancer: management as per onco outpatient    HLD: continue on statin   HTN: holding home dose of verapamil . IV hydralazine  prn       DVT prophylaxis: lovenox   Code Status: DNR Family Communication: discussed pt's care w/ pt's wife, [name], and answered her questions Disposition Plan: depends on PT/OT recs   Level of care: Telemetry  Status is: Inpatient Remains inpatient appropriate because: severity of illness    Consultants:    Procedures:   Antimicrobials:    Subjective: Pt is pleasantly confused   Objective: Vitals:   09/27/24 0500 09/27/24 0548 09/27/24 0820 09/27/24 1229  BP:   (!) 153/84 (!) 174/70 (!) 170/71  Pulse:  80 71 67  Resp: (!) 21 (!) 22 16 18   Temp:  98.3 F (36.8 C) (!) 97.4 F (36.3 C) 98.2 F (36.8 C)  TempSrc:  Oral    SpO2:  97% 93% 98%  Weight:      Height:        Intake/Output Summary (Last 24 hours) at 09/27/2024 1455 Last data filed at 09/27/2024 0500 Gross per 24 hour  Intake 1003.7 ml  Output --  Net 1003.7 ml   Filed Weights   09/26/24 1308  Weight: 85.3 kg    Examination:  General exam: Appears calm and comfortable  Respiratory system: course breath sounds b/l  Cardiovascular system: S1 & S2+. No rubs, gallops or clicks.  Gastrointestinal system: Abdomen is nondistended, soft and nontender. Normal bowel sounds heard. Central nervous system: Alert and awake Psychiatry: Judgement and insight appears poor. Flat mood and affect    Data Reviewed: I have personally reviewed following labs and imaging studies  CBC: Recent Labs  Lab 09/26/24 1320 09/26/24 1524 09/27/24 1205  WBC 5.6 4.7 6.6  NEUTROABS 4.1  --   --   HGB 9.2* 8.6* 9.4*  HCT 30.5* 28.7* 30.6*  MCV 99.7 99.3 97.8  PLT 238 193 224   Basic Metabolic Panel: Recent Labs  Lab 09/26/24 1320 09/26/24 1524 09/27/24 1205  NA 143  --  144  K 4.3  --  4.3  CL 106  --  111  CO2 27  --  25  GLUCOSE 80  --  98  BUN 43*  --  29*  CREATININE 2.82* 2.61* 1.88*  CALCIUM 8.8*  --  9.0   GFR: Estimated Creatinine Clearance: 33.8 mL/min (A) (by C-G formula based on SCr of 1.88 mg/dL (H)). Liver Function Tests: Recent Labs  Lab 09/26/24 1320 09/27/24 1205  AST 31 29  ALT 20 21  ALKPHOS 111 113  BILITOT 0.4 0.4  PROT 6.1* 5.8*  ALBUMIN 3.4* 3.2*   No results for input(s): LIPASE, AMYLASE in the last 168 hours. Recent Labs  Lab 09/26/24 1837  AMMONIA 24   Coagulation Profile: No results for input(s): INR, PROTIME in the last 168 hours. Cardiac Enzymes: Recent Labs  Lab 09/26/24 1524  CKTOTAL 122   BNP (last 3 results) No results for  input(s): PROBNP in the last 8760 hours. HbA1C: No results for input(s): HGBA1C in the last 72 hours. CBG: No results for input(s): GLUCAP in the last 168 hours. Lipid Profile: No results for input(s): CHOL, HDL, LDLCALC, TRIG, CHOLHDL, LDLDIRECT in the last 72 hours. Thyroid Function Tests: Recent Labs    09/26/24 1524  TSH 0.885   Anemia Panel: Recent Labs    09/26/24 1837  VITAMINB12 864   Sepsis Labs: Recent Labs  Lab 09/26/24 1524 09/26/24 1837 09/26/24 2246  PROCALCITON 0.19  --   --   LATICACIDVEN 1.4 2.3* 1.0    Recent Results (from the past 240 hours)  Resp panel by RT-PCR (RSV, Flu A&B, Covid) Anterior Nasal Swab     Status: None   Collection Time: 09/26/24  1:38 PM   Specimen: Anterior Nasal Swab  Result Value Ref Range Status   SARS Coronavirus 2 by RT PCR NEGATIVE NEGATIVE Final    Comment: (NOTE) SARS-CoV-2 target nucleic acids are NOT DETECTED.  The SARS-CoV-2 RNA is generally detectable in upper respiratory specimens during the acute phase of infection. The lowest concentration of SARS-CoV-2 viral copies this assay can detect is 138 copies/mL. A negative result does not preclude SARS-Cov-2 infection and should not be used as the sole basis for treatment or other patient management decisions. A negative result may occur with  improper specimen collection/handling, submission of specimen other than nasopharyngeal swab, presence of viral mutation(s) within the areas targeted by this assay, and inadequate number of viral copies(<138 copies/mL). A negative result must be combined with clinical observations, patient history, and epidemiological information. The expected result is Negative.  Fact Sheet for Patients:  bloggercourse.com  Fact Sheet for Healthcare Providers:  seriousbroker.it  This test is no t yet approved or cleared by the [place]  FDA and  has been authorized for  detection and/or diagnosis of SARS-CoV-2 by FDA under an Emergency Use Authorization (EUA). This EUA will remain  in effect (meaning this test can be used) for the duration of the COVID-19 declaration under Section 564(b)(1) of the Act, 21 U.S.C.section 360bbb-3(b)(1), unless the authorization is terminated  or revoked sooner.       Influenza A by PCR NEGATIVE NEGATIVE Final   Influenza B by PCR NEGATIVE NEGATIVE Final    Comment: (NOTE) The Xpert Xpress SARS-CoV-2/FLU/RSV plus assay is intended as an aid in the diagnosis of influenza from Nasopharyngeal swab specimens and should not be used as a sole basis for treatment. Nasal washings and aspirates are unacceptable for Xpert Xpress SARS-CoV-2/FLU/RSV testing.  Fact Sheet for Patients: bloggercourse.com  Fact Sheet for Healthcare Providers: seriousbroker.it  This test is not yet approved or cleared by  the [place]  FDA and has been authorized for detection and/or diagnosis of SARS-CoV-2 by FDA under an Emergency Use Authorization (EUA). This EUA will remain in effect (meaning this test can be used) for the duration of the COVID-19 declaration under Section 564(b)(1) of the Act, 21 U.S.C. section 360bbb-3(b)(1), unless the authorization is terminated or revoked.     Resp Syncytial Virus by PCR NEGATIVE NEGATIVE Final    Comment: (NOTE) Fact Sheet for Patients: bloggercourse.com  Fact Sheet for Healthcare Providers: seriousbroker.it  This test is not yet approved or cleared by the [place]  FDA and has been authorized for detection and/or diagnosis of SARS-CoV-2 by FDA under an Emergency Use Authorization (EUA). This EUA will remain in effect (meaning this test can be used) for the duration of the COVID-19 declaration under Section 564(b)(1) of the Act, 21 U.S.C. section 360bbb-3(b)(1), unless the authorization is  terminated or revoked.  Performed at [facility] [address], [place], [place] [address]          Radiology Studies: US  Venous Img Lower Bilateral (DVT) Result Date: 09/26/2024 CLINICAL DATA:  Positive D-dimer. EXAM: BILATERAL LOWER EXTREMITY VENOUS DOPPLER ULTRASOUND TECHNIQUE: Gray-scale sonography with compression, as well as color and duplex ultrasound, were performed to evaluate the deep venous system(s) from the level of the common femoral vein through the popliteal and proximal calf veins. COMPARISON:  None Available. FINDINGS: VENOUS Normal compressibility of the common femoral, superficial femoral, and popliteal veins, as well as the visualized calf veins. Visualized portions of profunda femoral vein and great saphenous vein unremarkable. No filling defects to suggest DVT on grayscale or color Doppler imaging. Doppler waveforms show normal direction of venous flow, normal respiratory plasticity and response to augmentation. Limited views of the contralateral common femoral vein are unremarkable. OTHER Fluid collection is seen in the left popliteal fossa measuring 5.5 x 1.5 x 4.2 cm. Limitations: none IMPRESSION: 1. No evidence of deep venous thrombosis bilaterally. 2. Fluid collection in the left popliteal fossa, suggesting Baker's cyst. Electronically Signed   By: Leita Birmingham M.D.   On: 09/26/2024 18:14   US  RENAL Result Date: 09/26/2024 CLINICAL DATA:  Acute kidney injury. EXAM: RENAL / URINARY TRACT ULTRASOUND COMPLETE COMPARISON:  None Available. FINDINGS: Right Kidney: Renal measurements: 9.6 cm x 5.4 cm x 4.7 cm = volume: 127.9 mL. There is diffusely increased echogenicity of the renal parenchyma. A 1.6 cm x 1.6 cm x 1.3 cm simple cyst is seen within the upper pole of the right kidney. No hydronephrosis is visualized. Left Kidney: Renal measurements: 9.6 cm x 5.4 cm x 4.9 cm = volume: 133.6 mL. There is diffusely increased echogenicity of the renal parenchyma. 4.0 cm  x 3.8 cm x 3.5 cm and 5.6 cm x 4.9 cm x 5.5 cm simple renal cysts are seen within the left kidney. No hydronephrosis is visualized. Bladder: Appears normal for degree of bladder distention. Other: Of incidental note is the presence of a 4.1 cm x 4.4 cm x 3.8 cm hepatic cysts. IMPRESSION: 1. Large hepatic and bilateral simple renal cysts. 2. Bilateral echogenic kidneys which may represent sequelae associated with medical renal disease. Electronically Signed   By: Suzen Dials M.D.   On: 09/26/2024 15:43   CT Head Wo Contrast Result Date: 09/26/2024 CLINICAL DATA:  Altered mental status. EXAM: CT HEAD WITHOUT CONTRAST TECHNIQUE: Contiguous axial images were obtained from the base of the skull through the vertex without intravenous contrast. RADIATION DOSE REDUCTION: This exam was performed  according to the departmental dose-optimization program which includes automated exposure control, adjustment of the mA and/or kV according to patient size and/or use of iterative reconstruction technique. COMPARISON:  08/22/2024 FINDINGS: Brain: No evidence of intracranial hemorrhage, acute infarction, hydrocephalus, extra-axial collection, or mass lesion/mass effect. Mild-to-moderate diffuse cerebral atrophy and chronic small vessel disease show no significant change. Vascular:  No hyperdense vessel or other acute findings. Skull: No evidence of fracture or other significant bone abnormality. Sinuses/Orbits:  No acute findings. Other: None. IMPRESSION: No acute intracranial abnormality. Stable cerebral atrophy and chronic small vessel disease. Electronically Signed   By: Norleen DELENA Kil M.D.   On: 09/26/2024 13:57   DG Chest 1 View Result Date: 09/26/2024 CLINICAL DATA:  Cough. EXAM: CHEST  1 VIEW COMPARISON:  August 22, 2024 FINDINGS: The heart size and mediastinal contours are within normal limits. Low lung volumes are noted with mild, stable elevation of the left hemidiaphragm. Mild atelectasis is seen within the left  lung base. No pleural effusion or pneumothorax is identified. Multilevel degenerative changes are seen throughout the thoracic spine. IMPRESSION: Low lung volumes with mild left basilar atelectasis. Electronically Signed   By: Suzen Dials M.D.   On: 09/26/2024 13:41        Scheduled Meds:  allopurinol   100 mg Oral Daily   cyanocobalamin   1,000 mcg Oral Daily   enoxaparin  (LOVENOX ) injection  40 mg Subcutaneous Q24H   folic acid   1 mg Oral Daily   [name]  1 Application Topical TID   pravastatin   40 mg Oral Daily   Continuous Infusions:  lactated ringers  75 mL/hr at 09/27/24 0500     LOS: 1 day       Anthony CHRISTELLA Pouch, MD [facility] Pager [phone]  If 7PM-7AM, please contact night-coverage [email or web address] Password [facility] 09/27/2024, 2:55 PM   "

## 2024-09-27 NOTE — Progress Notes (Signed)
*  PRELIMINARY RESULTS* Echocardiogram 2D Echocardiogram has been performed.  Floydene Harder 09/27/2024, 7:56 AM

## 2024-09-27 NOTE — Progress Notes (Signed)
*  PRELIMINARY RESULTS* Echocardiogram 2D Echocardiogram has been performed.  Floydene Harder 09/27/2024, 7:57 AM

## 2024-09-27 NOTE — Progress Notes (Incomplete)
 "        Dementia likely due to Alzheimer's disease   Jason Estrada is a very pleasant 84 y.o. RH male with a history of CKD stage IV,*** history of prostate cancer, anxiety,  recent sepsis due to olecranon bursitis requiring hospitalization, discharged on 08/26/2024 and a history of dementia likely due to Alzheimer's disease  presenting today in follow-up for evaluation of memory loss.  Patient was last seen on 02/27/2024***. Patient is on memantine  10 mg twice daily and donepezil  23 mg daily.  He continues to have cognitive decline, and needs more assistance with ADLs.  Mood is***.  He continues to attend ADP.   Follow-up in 6 months Continue donepezil  23 mg daily and memantine  10 mg twice daily Continue trazodone  25 mg at bedtime Commend resuming ADP Role of cardiovascular risk factors Continue to control other mood medications as per PCP  Discussed the use of AI scribe software for clinical note transcription with the patient, who gave verbal consent to proceed.  History of Present Illness   Any changes in memory since last visit?   Memory is worse .  He does not recognize his wife at times.  He tries to communicate but does not remember words. He picks the skin, ears, laughs at any comment, fidgets, taking of  and putting on his shoes  repeats oneself?  He is less conversant than before  Disoriented when walking into a room?  He may confuse the rooms at times.  In addition he had a recent presentation to the hospital with toxic encephalopathy (on 09/26/2024) without sepsis, admitted for workup*** Leaving objects?  May misplace things but not in unusual places. He took all of the cleaning stuff of the bathroom, he even took the shelf, he was trying to reach the sink, because he was trying to pee in the vanity.  He then decided to urinate in the shower.   Wandering behavior?  denies.  He is always with his wife when at home. Any personality changes since last visit?  Some tantrums   Any worsening depression?:  Denies.   Hallucinations or paranoia?  Denies.   Seizures? denies    Any sleep changes?  He does not sleep very well because he confuses days and nights. He roams around, getting dressed and undressed. Denies vivid dreams, REM behavior or sleepwalking   Sleep apnea?   Denies.   Any hygiene concerns?  He does not want to shower -wife says Independent of bathing and dressing?  He needs help to get dressed Does the patient needs help with medications?  Wife is in charge  Who is in charge of the finances?  Wife is in charge    Any changes in appetite?  Eats well but even dog food, he binges and can eat 1 lb of cheese, a whole bag of pepperoni, drink all bottle of cranberry juice    Patient have trouble swallowing? Denies.   Does the patient cook? No Any headaches?   denies   Any vision changes?  Denies  Chronic back pain  denies   Ambulates with difficulty?  He is slower than before, and flexes forward.  Uses a cane for stability.   He is increasingly weak. Recent falls or head injuries? Had a soft fall May 23, he went to the front door, fallen in the street [place], wife feel that he was exhausted.  No head injury. Since then she has security locks . Unilateral weakness, numbness or  tingling? denies   Any tremors?  Denies   Any anosmia?  Denies   Any incontinence of urine?  Endorsed, wears diapers   Any bowel dysfunction?  Endorse, he is incontinent now since 01/2022   Patient lives with wife  Does the patient drive? No longer drives      Neuropsych evaluation 03/2023 Briefly, results suggested severe impairment surrounding all aspects of learning and memory. Additional impairments were exhibited across processing speed, several aspects of executive functioning (i.e., cognitive flexibility, response inhibition, and safety/judgment), and semantic fluency. Performance variability was exhibited across confrontation naming. Relative to his previous  evaluation in January 2023, cognitive decline was exhibited across several domains including processing speed, executive functioning, semantic fluency, confrontation naming, and recognition/consolidation aspects of memory. Broadly speaking, the rate of decline appeared relatively mild. Stability was exhibited across other assessed domains. Regarding etiology, I continue to have primary concerns surrounding underlying Alzheimer's disease (i.e., dementia due to Alzheimer's disease). Across memory testing, Mr. Sanders did not benefit from repeated exposure to information across learning trials, was essentially amnestic across memory tasks after a short delay, and performed poorly across yes/no recognition tasks. This suggests the presence of rapid forgetting and a significant memory storage deficit, both of which are hallmark characteristics of Alzheimer's disease. Evidence for progressive cognitive decline over the past 18 months also elevates concerns for the presence of a neurodegenerative illness, as does this decline largely occurring in domains commonly impacted by this illness (e.g., memory, semantic fluency, confrontation naming, executive functioning). His wife's reporting of him rapidly forgetting information and repeating himself continues to align with this illness as well. Overall, this would appear the most likely culprit for ongoing cognitive impairment and progressive decline.   Initial Evaluation 06/04/21 The patient is seen in neurologic consultation at the request of [name], [name], MD for the evaluation of memory.  The patient is accompanied by his wife who supplements the history. 63 y.o. year old male who has had memory issues for about 1 year when he noticed worsening short term memory deficiencies. He pauses till he can find the word -wife says.  He misses appointments frequently. He moved from [place]  1 year ago, and his wife would travel from there to [place] during this time, able to see  these changes. This was worse until 4 weeks ago, somehow it is a little bit better but not much. He does repeat the same stories and questions. He denies depression or irritability. His mood is good. He  likes to play solitaire on his phone, trivia, reading and walking. He sleeps better than when first moving, He has some vivid dreams without  sleepwalking. At times he confuses the days and nights. He wanted to eat breakfast at midnight.  In July he was confused, found to have elevated PSA and was referred to a urologist, workup ongoing. He has some urine hesitancy because of prostatic issues. Currently denies hallucinations or paranoia. Denies leaving objects in unusual places.   Wife reports issues with hygiene.Bathing since around July, is a battle, and he likes to use the same underwear. Wife administers the medications and manages the bills, because he fell victim to scams. His appetite is good and denies trouble swallowing, He cooks and denies leaving the stove on. Wife supervises . He ambulates without devices, but is prone to falls due to gout. Denies head injuries.    He drives with GPS and wife is concerned because he relies on it and still gets lost, took me to  the wrong place the other day. He has issues with time gap as well. He believes that he has been in  longer, no sense of when and where he is.  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 Recently has  worked for [employer]. Denies headaches, double vision, dizziness, focal numbness or tingling, unilateral weakness or tremors.  No constipation or diarrhea. Denies anosmia. Denies history of OSA, ETOH or Tobacco. Family History negative for dementia.      PREVIOUS MEDICATIONS:      Objective:     PHYSICAL EXAMINATION:    VITALS:  There were no vitals filed for this visit.  GEN:  The patient appears stated age and is in NAD. HEENT:  Normocephalic, atraumatic.        06/01/2021     9:00 AM  Montreal Cognitive Assessment   Visuospatial/ Executive (0/5) 4  Naming (0/3) 3  Attention: Read list of digits (0/2) 2  Attention: Read list of letters (0/1) 0  Attention: Serial 7 subtraction starting at 100 (0/3) 1  Language: Repeat phrase (0/2) 2  Language : Fluency (0/1) 0  Abstraction (0/2) 0  Delayed Recall (0/5) 0  Orientation (0/6) 4  Total 16  Adjusted Score (based on education) 17       11/26/2022    4:00 PM 05/14/2022    7:00 AM  MMSE - Mini Mental State Exam  Orientation to time 4 0  Orientation to Place 3 2  Registration 3 3  Attention/ Calculation 4 4  Recall 2 0  Language- name 2 objects 2 2  Language- repeat 1 1  Language- follow 3 step command 3 2  Language- read & follow direction 1 1  Write a sentence 1 0  Copy design 1 1  Total score 25 16        Neurological examination:  Orientation: The patient is alert.  Not oriented to person, place and date.  Cranial nerves: There is good facial symmetry.The speech is not fluent but clear.  No aphasia or dysarthria. Fund of knowledge is reduced. Recent memory impaired and remote memory is normal.  Attention and concentration are normal.  Able to name objects and repeat phrases.  Hearing is intact to conversational tone ***.   Delayed recall *** Sensation: Sensation is intact to light touch throughout Motor: Strength is at least antigravity x4. DTR's 2/4 in UE/LE   Movement examination: Tone: There is normal tone in the UE/LE Abnormal movements:  no tremor.  No myoclonus.  No asterixis.   Coordination:  There is  decremation with RAM's.  Abnormal finger to nose  Gait and Station: The patient has no difficulty arising out of a deep-seated chair without the use of the hands, uses a cane for stability, flexes forward. The patient's stride length is short.  Gait is cautious and narrow.   Thank you for allowing us  the opportunity to participate in the care of this nice patient. Please do not hesitate to  contact us  for any questions or concerns.   Total time spent on today's visit was *** minutes dedicated to this patient today, preparing to see patient, examining the patient, ordering tests and/or medications and counseling the patient, documenting clinical information in the EHR or other health record, independently interpreting results and communicating results to the patient/family, discussing treatment and goals, answering patient's questions and coordinating care.  Cc:  [name], NP  Camie Sevin 09/27/2024 5:50 AM      "

## 2024-09-27 NOTE — Plan of Care (Signed)

## 2024-09-28 ENCOUNTER — Ambulatory Visit: Admitting: Physician Assistant

## 2024-09-28 DIAGNOSIS — Z7189 Other specified counseling: Secondary | ICD-10-CM | POA: Diagnosis not present

## 2024-09-28 DIAGNOSIS — G929 Unspecified toxic encephalopathy: Secondary | ICD-10-CM | POA: Diagnosis not present

## 2024-09-28 LAB — CBC
HCT: 35 % — ABNORMAL LOW (ref 39.0–52.0)
Hemoglobin: 10.2 g/dL — ABNORMAL LOW (ref 13.0–17.0)
MCH: 29.6 pg (ref 26.0–34.0)
MCHC: 29.1 g/dL — ABNORMAL LOW (ref 30.0–36.0)
MCV: 101.4 fL — ABNORMAL HIGH (ref 80.0–100.0)
Platelets: 248 K/uL (ref 150–400)
RBC: 3.45 MIL/uL — ABNORMAL LOW (ref 4.22–5.81)
RDW: 13.7 % (ref 11.5–15.5)
WBC: 7.4 K/uL (ref 4.0–10.5)
nRBC: 0 % (ref 0.0–0.2)

## 2024-09-28 LAB — BASIC METABOLIC PANEL WITH GFR
Anion gap: 8 (ref 5–15)
BUN: 26 mg/dL — ABNORMAL HIGH (ref 8–23)
CO2: 26 mmol/L (ref 22–32)
Calcium: 9.1 mg/dL (ref 8.9–10.3)
Chloride: 111 mmol/L (ref 98–111)
Creatinine, Ser: 1.81 mg/dL — ABNORMAL HIGH (ref 0.61–1.24)
GFR, Estimated: 37 mL/min — ABNORMAL LOW
Glucose, Bld: 91 mg/dL (ref 70–99)
Potassium: 4.5 mmol/L (ref 3.5–5.1)
Sodium: 145 mmol/L (ref 135–145)

## 2024-09-28 LAB — GLUCOSE, CAPILLARY: Glucose-Capillary: 76 mg/dL (ref 70–99)

## 2024-09-28 MED ORDER — FUROSEMIDE 10 MG/ML IJ SOLN
40.0000 mg | Freq: Once | INTRAMUSCULAR | Status: AC
  Administered 2024-09-28: 40 mg via INTRAVENOUS
  Filled 2024-09-28: qty 4

## 2024-09-28 MED ORDER — HYDROCOD POLI-CHLORPHE POLI ER 10-8 MG/5ML PO SUER
5.0000 mL | Freq: Two times a day (BID) | ORAL | Status: DC | PRN
  Administered 2024-09-28 – 2024-09-30 (×3): 5 mL via ORAL
  Filled 2024-09-28 (×3): qty 5

## 2024-09-28 MED ORDER — METHYLPREDNISOLONE SODIUM SUCC 40 MG IJ SOLR
40.0000 mg | Freq: Every day | INTRAMUSCULAR | Status: DC
  Administered 2024-09-28: 40 mg via INTRAVENOUS
  Filled 2024-09-28 (×2): qty 1

## 2024-09-28 MED ORDER — GUAIFENESIN ER 600 MG PO TB12
600.0000 mg | ORAL_TABLET | Freq: Two times a day (BID) | ORAL | Status: DC
  Administered 2024-09-28 – 2024-10-01 (×7): 600 mg via ORAL
  Filled 2024-09-28 (×7): qty 1

## 2024-09-28 MED ORDER — INSULIN ASPART 100 UNIT/ML IJ SOLN: 0.0000 [IU] | Freq: Three times a day (TID) | INTRAMUSCULAR | Status: DC

## 2024-09-28 MED ORDER — BENZONATATE 100 MG PO CAPS
200.0000 mg | ORAL_CAPSULE | Freq: Three times a day (TID) | ORAL | Status: DC | PRN
  Administered 2024-09-28 – 2024-09-29 (×2): 200 mg via ORAL
  Filled 2024-09-28 (×2): qty 2

## 2024-09-28 MED ORDER — DIPHENHYDRAMINE HCL 25 MG PO CAPS
50.0000 mg | ORAL_CAPSULE | Freq: Every day | ORAL | Status: DC
  Administered 2024-09-28 – 2024-10-01 (×4): 50 mg via ORAL
  Filled 2024-09-28 (×4): qty 2

## 2024-09-28 MED ORDER — DEXTROSE 50 % IV SOLN
1.0000 | Freq: Once | INTRAVENOUS | Status: AC
  Administered 2024-09-28: 50 mL via INTRAVENOUS
  Filled 2024-09-28: qty 50

## 2024-09-28 MED ORDER — IPRATROPIUM-ALBUTEROL 0.5-2.5 (3) MG/3ML IN SOLN
3.0000 mL | Freq: Four times a day (QID) | RESPIRATORY_TRACT | Status: DC
  Administered 2024-09-29: 3 mL via RESPIRATORY_TRACT
  Filled 2024-09-28: qty 3

## 2024-09-28 NOTE — Progress Notes (Signed)
 Patient's CBG noted to be 76. Notified MD, orders given for Amp of D50. Suctioned patient and gave meds per MAR. See orders.

## 2024-09-28 NOTE — Progress Notes (Signed)
 Notified MD that patient is unable to safely swallow due to LOC. MD aware. No new orders.

## 2024-09-28 NOTE — Progress Notes (Signed)
 OT Cancellation Note  Patient Details Name: Jason Estrada MRN: [id number] DOB: 19-Feb-1941   Cancelled Treatment:    Reason Eval/Treat Not Completed: Other (comment). Spoke with PT. Pt continues to demo decreased alertness and with significant coughing limiting appropriateness for exertional activity. Will re-attempt OT evaluation at later date/time as appropriate.   Iran Rowe R., MPH, MS, OTR/L ascom [phone] 09/28/2024, 1:19 PM

## 2024-09-28 NOTE — Progress Notes (Signed)
 Notified MD via epic chat of pt's inability to wake up. Pt is snoring, does not respond to sternal rub. VSS. Pt received haldol  at aprox 0200 this morning for agitation. MD states to monitor patient at this time. Telemetry to be replaced on patient.

## 2024-09-28 NOTE — Progress Notes (Signed)
 MEWS Progress Note  Patient Details Name: Sachit Gilman MRN: [id number] DOB: 1941/07/28 Today's Date: 09/28/2024   MEWS Flowsheet Documentation:  Assess: MEWS Score Temp: 98.7 F (37.1 C) BP: (!) 117/51 MAP (mmHg): 71 Pulse Rate: 73 ECG Heart Rate: 80 Resp: 16 Level of Consciousness: Unresponsive (MD aware.) SpO2: 100 % O2 Device: Nasal Cannula O2 Flow Rate (L/min): 4 L/min Assess: MEWS Score MEWS Temp: 0 MEWS Systolic: 0 MEWS Pulse: 0 MEWS RR: 0 MEWS LOC: 3 MEWS Score: 3 MEWS Score Color: Yellow Assess: SIRS CRITERIA SIRS Temperature : 0 SIRS Respirations : 0 SIRS Pulse: 0 SIRS WBC: 0 SIRS Score Sum : 0 Assess: if the MEWS score is Yellow or Red Were vital signs accurate and taken at a resting state?: Yes Does the patient meet 2 or more of the SIRS criteria?: No MEWS guidelines implemented : Yes, yellow Treat MEWS Interventions: Considered administering scheduled or prn medications/treatments as ordered Take Vital Signs Increase Vital Sign Frequency : Yellow: Q2hr x1, continue Q4hrs until patient remains green for 12hrs Escalate MEWS: Escalate: Yellow: Discuss with charge nurse and consider notifying provider and/or RRT   MD aware. Pt was given haldol  at 2am. Vital signs are stable.      Almarie DELENA Garret 09/28/2024, 10:20 AM

## 2024-09-28 NOTE — Progress Notes (Signed)
 PT Cancellation Note  Patient Details Name: Jason Estrada MRN: [id number] DOB: 1941-08-04   Cancelled Treatment:    Reason Eval/Treat Not Completed: Other (comment). Pt did open eyes on command and oriented to self but quickly returns to resting with eyes closed. Significant coughing noted as well throughout PT presence in room, difficulty truly speaking because of this. PT to re-attempt as able.   Doyal Shams PT, DPT 1:12 PM,09/28/2024

## 2024-09-28 NOTE — Consult Note (Signed)
 "                                                                                   Consultation Note Date: 09/28/2024   Patient Name: Jason Estrada  DOB: Apr 29, 1941  MRN: [id number]  Age / Sex: 84 y.o., male  PCP: [name], NP Referring Physician: Trudy Anthony HERO, MD  Reason for Consultation: Establishing goals of care  HPI/Patient Profile: Mr. Jason Estrada is a 84 year old gentleman with a past medical history of dementia, prostate cancer gastroesophageal reflux disease, hypertension, chronic kidney disease stage IIIb and recent hospitalization for sepsis secondary to olecranon bursitis discharged 08/26/2024 who presented to the emergency department with mental status change presenting as inability to be aroused and somnolence. The patient's wife [name] at the bedside provides most of the history. She reports the patient was recently discharged from inpatient rehab after a 30-day short-term rehabilitation stay. The patient was noted to have over the last 4 to 5 days a cough but continue to eat and drink normally as per the spouse.   Clinical Assessment and Goals of Care:  Notes and labs reviewed. In to see patient. He is sleeping with even and unlabored respirations; he did not respond to voice or touch. Tele sitter in place. No family at bedside.   Called to talk to wife. She discusses previous admission, D/C to rehab, and subsequent discharge home. He was home 1 day before coming to this admission.  She states home health nurse was present and 911 was called to come to the hospital.  She discusses noticing signs of dementia 5 to 6 years ago when he would use the car GPS to go to places like the store that he had gone to for many years.  She states he got lost a couple of times and they took his car about a year ago.  She discusses he was formally diagnosed with dementia around 3 years ago.  She states at baseline he dresses himself, and feeds himself.  She states he wears pull-ups just  in case as for about a week he had an issue with incontinence that later resolved.  She states he talks but is less communicative than he used to be.  She states at baseline he repeated himself telling stories from decades ago, but has been talking less.  She discusses that he becomes frustrated very easily.  She discusses that at the facility he would wander all night long, and in the staff had an ankle monitor applied and would find him at various places in the facility.  She discusses that he sundown's.  Discussed meeting tomorrow to discuss goals of care moving forward.    SUMMARY OF RECOMMENDATIONS   PMT will meet with wife at bedside tomorrow morning.      Primary Diagnoses: Present on Admission:  Toxic encephalopathy  AKI (acute kidney injury)  Gout  HTN (hypertension)  HLD (hyperlipidemia)  GERD (gastroesophageal reflux disease)  Prostate cancer   I have reviewed the medical record, interviewed the patient and family, and examined the patient. The following aspects are pertinent.  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 This was completed in June 2023, and initial ADT injection was given 12/10/2021.    Sinus tachycardia 09/26/2021   Vitamin B12 deficiency 02/18/2023   229 December 2023   Social History    Socioeconomic History   Marital status: Married    Spouse name: Not on file   Number of children: Not on file   Years of education: 16   Highest education level: Bachelor's degree (e.g., BA, AB, BS)  Occupational History   Occupation: Retired    Comment: Psychiatrist  Tobacco Use   Smoking status: Never    Passive exposure: Never   Smokeless tobacco: Never  Vaping Use   Vaping status: Never Used  Substance and Sexual Activity   Alcohol use: Yes    Alcohol/week: 7.0 - 14.0 standard drinks of alcohol    Types: 7 - 14 Shots of liquor per week    Comment: 1 mixed drinks nightly   Drug use: Not Currently   Sexual activity: Not Currently  Other Topics Concern   Not on file  Social History Narrative   Right handed   Drinks caffeine   One story home   Married wife with him   Social Drivers of Health   Tobacco Use: Low Risk (09/26/2024)   Patient History    Smoking Tobacco Use: Never    Smokeless Tobacco Use: Never    Passive Exposure: Never  Financial Resource Strain: Low Risk  (09/17/2024)   Received from [facility]   Overall Financial Resource Strain (CARDIA)    Difficulty of Paying Living Expenses: Not very hard  Food Insecurity: No Food Insecurity (09/17/2024)   Received from [facility]   Epic    Within the past 12 months, you worried that your food would run out before you got the money to buy more.: Never true    Within the past 12 months, the food you bought just didn't last and you didn't have money to get more.: Never true  Transportation Needs: No Transportation Needs (09/17/2024)   Received from [facility] - Transportation    In the past 12 months, has lack of transportation kept you from medical appointments or from getting medications?: No    Lack of Transportation (Non-Medical): No  Physical Activity: Not on file  Stress: Not on file  Social Connections: Unknown (08/23/2024)   Social  Connection and Isolation Panel    Frequency of Communication with Friends and Family: Patient unable to answer    Frequency of Social Gatherings with Friends and Family: Patient unable to answer    Attends Religious Services: Not on file    Active Member of Clubs or Organizations: Patient unable to answer    Attends Banker Meetings: Patient unable to answer    Marital Status: Patient unable to answer  Depression (PHQ2-9):  Low Risk (06/22/2024)   Depression (PHQ2-9)    PHQ-2 Score: 1  Alcohol Screen: Not on file  Housing: Low Risk  (09/17/2024)   Received from [facility]   Epic    In the last 12 months, was there a time when you were not able to pay the mortgage or rent on time?: No    In the past 12 months, how many times have you moved where you were living?: 0    At any time in the past 12 months, were you homeless or living in a shelter (including now)?: No  Utilities: Not At Risk (09/17/2024)   Received from [facility]   Epic    In the past 12 months has the electric, gas, oil, or water company threatened to shut off services in your home?: No  Health Literacy: Not on file   History reviewed. No pertinent family history. Scheduled Meds:  allopurinol   100 mg Oral Daily   cyanocobalamin   1,000 mcg Oral Daily   enoxaparin  (LOVENOX ) injection  40 mg Subcutaneous Q24H   folic acid   1 mg Oral Daily   [name]  1 Application Topical TID   methylPREDNISolone  (SOLU-MEDROL ) injection  40 mg Intravenous Daily   pravastatin   40 mg Oral Daily   Continuous Infusions: PRN Meds:.acetaminophen  **OR** acetaminophen , albuterol , haloperidol  lactate, hydrALAZINE , ondansetron  **OR** ondansetron  (ZOFRAN ) IV, QUEtiapine , senna-docusate Medications Prior to Admission:  Prior to Admission medications  Medication Sig Start Date End Date Taking? Authorizing Provider  acetaminophen  (TYLENOL ) 325 MG tablet Take 2 tablets (650 mg total) by mouth  every 6 (six) hours as needed for mild pain (pain score 1-3) or fever (or Fever >/= 101). 08/26/24  Yes Franchot Novel, MD  allopurinol  (ZYLOPRIM ) 100 MG tablet Take 1 tablet (100 mg total) by mouth daily. 08/21/23  Yes Cannady, Jolene T, NP  baclofen  (LIORESAL ) 10 MG tablet Take 0.5 tablets (5 mg total) by mouth daily as needed. 08/26/24  Yes Franchot Novel, MD  cyanocobalamin  (VITAMIN B12) 1000 MCG tablet Take 1,000 mcg by mouth daily.   Yes [provider]  donepezil  (ARICEPT ) 23 MG TABS tablet TAKE 1 TABLET (23 MG TOTAL) BY MOUTH DAILY. 09/01/24  Yes Wertman, Sara E, PA-C  folic acid  (FOLVITE ) 1 MG tablet TAKE 1 TABLET BY MOUTH EVERY DAY 08/18/24  Yes Cannady, Jolene T, NP  hydrALAZINE  (APRESOLINE ) 50 MG tablet Take 0.5 tablets (25 mg total) by mouth in the morning and at bedtime. Patient taking differently: Take 50 mg by mouth daily. 08/21/23  Yes Cannady, Jolene T, NP  memantine  (NAMENDA ) 10 MG tablet TAKE 1 TABLET BY MOUTH TWICE A DAY 09/17/24  Yes Wertman, Sara E, PA-C  Nystatin ([name]) CREA Apply 1 Application topically 3 (three) times daily. 08/26/24  Yes Franchot Novel, MD  pantoprazole  (PROTONIX ) 20 MG tablet Take 1 tablet (20 mg total) by mouth daily. 08/21/23  Yes Cannady, Jolene T, NP  pravastatin  (PRAVACHOL ) 40 MG tablet TAKE 1 TABLET BY MOUTH EVERY DAY 09/08/24  Yes Cannady, Jolene T, NP  traZODone  (DESYREL ) 50 MG tablet TAKE HALF TABLET AT NIGHT, MAY INCREASE TO ONE FULL TABLET IF NEEDED 03/23/24  Yes Wertman, Sara E, PA-C  verapamil  (CALAN -SR) 240 MG CR tablet TAKE 1 TABLET BY MOUTH EVERY DAY 09/17/24  Yes Cannady, Jolene T, NP  [Paused] colchicine  0.6 MG tablet TAKE 1 TABLET BY MOUTH EVERY DAY Wait to take this until your doctor or other care provider tells you  to start again. 07/01/24   Cannady, Jolene T, NP   Allergies[1] Review of Systems  Unable to perform ROS   Physical Exam Constitutional:      Comments: Eyes closed  Pulmonary:     Comments:  Even and unlabored respirations    Vital Signs: BP (!) 117/51 (BP Location: Left Arm)   Pulse 73   Temp 98.7 F (37.1 C)   Resp 16   Ht 6' (1.829 m)   Wt 85.3 kg   SpO2 100%   BMI 25.50 kg/m  Pain Scale: 0-10   Pain Score: Asleep   SpO2: SpO2: 100 % O2 Device:SpO2: 100 % O2 Flow Rate: .O2 Flow Rate (L/min): 4 L/min  IO: Intake/output summary: No intake or output data in the 24 hours ending 09/28/24 1558  LBM: Last BM Date : 09/27/24 Baseline Weight: Weight: 85.3 kg Most recent weight: Weight: 85.3 kg        Time In: 3:15 Time Out: 4:15 Time Total: 60 min Greater than 50%  of this time was spent counseling and coordinating care related to the above assessment and plan.  Signed by: Camelia Lewis, NP   Please contact Palliative Medicine Team phone at [phone] for questions and concerns.  For individual provider: See Amion                 [1] No Known Allergies  "

## 2024-09-28 NOTE — Progress Notes (Addendum)
 " PROGRESS NOTE   HPI was taken from Dr. Ezzard: Mr. Jason Estrada is a 84 year old gentleman with a past medical history of dementia, prostate cancer gastroesophageal reflux disease, hypertension, chronic kidney disease stage IIIb and recent hospitalization for sepsis secondary to olecranon bursitis discharged 08/26/2024 who presented to the emergency department with mental status change presenting as inability to be aroused and somnolence.  The patient's wife [name] at the bedside provides most of the history.  She reports the patient was recently discharged from inpatient rehab after a 30-day short-term rehabilitation stay.  The patient was noted to have over the last 4 to 5 days a cough but continue to eat and drink normally as per the spouse.  She had been bringing in protein drinks and on the day of discharge from the rehab yesterday the patient had an episode of sundowning and confusion requiring extra time to arouse him.  Of note the patient had issues with inability to function with rehabilitation well at the facility due to the fact he was given Seroquel  and was overly sedated.  When the patient's home health care nurse was unable to arouse him this morning the activated EMS.  Of note the patient was reportedly bradycardic in the 40s with systolic pressure in the 100s when EMS arrived with pinpoint pupils as well as desaturation and reportedly responded well to Narcan although the patient's wife reports no opiates in the house and the patient is dependent on her for medications.  The patient had also been receiving aggressive diuresis reportedly with Lasix  for dependent edema which patient's wife reports is significantly improved now.  Patient is confused at baseline although the patient's wife believes he is now at his mental baseline.  Patient's wife cannot recall any past echocardiograms or diagnosis of heart failure   Case discussed with emergency department provider and patient subsequently placed in  admission.   Past medical records reviewed and summarized: Discharge summary from 08/26/2024: Patient had sepsis secondary to olecranon bursitis   Thaxton Pelley  FMW:[id number] DOB: Dec 09, 1940 DOA: 09/26/2024 PCP: [name], NP   Assessment & Plan:   Principal Problem:   Toxic encephalopathy Active Problems:   Gout   Bradycardia   AKI (acute kidney injury)   HTN (hypertension)   HLD (hyperlipidemia)   Prostate cancer   GERD (gastroesophageal reflux disease)   Dementia with behavioral disturbance (HCC)   Cough   Troponin level elevated   Anemia due to chronic kidney disease   Lower extremity edema  Assessment and Plan: Toxic encephalopathy: etiology unclear, possibly rhinovirus/enterovirus vs baclofen  accumulation. CT head showed no acute intracranial abnormalities. Continue w/ neuro checks  Rhinovirus/enterovirus infection: started on IV steroids, bronchodilators & encourage incentive spirometry. Continue w/ supportive care    Bradycardia: etiology unclear. Holding home dose of verapamil     Gout: continue on home dose of allopurinol     AKI on CKDIV: Cr is trending down again today. Avoid nephrotoxic meds    Lower extremity edema: US  of b/l LE was neg. CTA chest ordered b/c d-dimer was elevated. Echo shows EF 60-65%, grade I diastolic dysfunction, no regional wall motion abnormalities   ACD: likely secondary to CKD. No need for a transfusion currently  Ascending thoracic aortic aneurysm: 4 cm and incidental finding on CTA chest. Will need repeat imagining in 1 years w/ CTA or MRA.   Elevated troponins: trending down. Likely secondary to demand ischemia   Cough: likely secondary to rhinovirus/enterovirus. Continue w/ supportive care    Dementia:  with behavioral disturbance. Holding donepezil  secondary to bradycardia. Haldol  prn for agitation. Palliative care consulted for goals of care   GERD: continue on PPI    Prostate cancer: management as per [facility] outpatient     HLD: continue on statin    HTN: holding home dose of verapamil . IV hydralazine  prn       DVT prophylaxis: lovenox   Code Status: DNR Family Communication: discussed pt's care w/ pt's wife, [name], and answered her questions Disposition Plan: depends on PT/OT recs   Level of care: Telemetry  Status is: Inpatient Remains inpatient appropriate because: severity of illness    Consultants:  Palliative care   Procedures:   Antimicrobials:    Subjective: Pt is very lethargic.   Objective: Vitals:   09/28/24 0013 09/28/24 0403 09/28/24 0605 09/28/24 0806  BP: (!) 132/45 (!) 139/106 (!) 147/71 (!) 117/51  Pulse: 88 (!) 104 90 73  Resp: 16 16  16   Temp: 98 F (36.7 C) 98.6 F (37 C)  98.7 F (37.1 C)  TempSrc: Oral Oral    SpO2: 97% 98%  100%  Weight:      Height:        Intake/Output Summary (Last 24 hours) at 09/28/2024 0909 Last data filed at 09/27/2024 1512 Gross per 24 hour  Intake 736.96 ml  Output --  Net 736.96 ml   Filed Weights   09/26/24 1308  Weight: 85.3 kg    Examination:  General exam: appears uncomfortable  Respiratory system: course breath sounds b/l. Rales b/l  Cardiovascular system: S1/S2+. No rubs or clicks   Gastrointestinal system: Abd is soft, NT, ND & hypoactive bowel sounds  Central nervous system: lethargic  Psychiatry: Judgement and insight appears poor. Flat mood and affect     Data Reviewed: I have personally reviewed following labs and imaging studies  CBC: Recent Labs  Lab 09/26/24 1320 09/26/24 1524 09/27/24 1205 09/28/24 0545  WBC 5.6 4.7 6.6 7.4  NEUTROABS 4.1  --   --   --   HGB 9.2* 8.6* 9.4* 10.2*  HCT 30.5* 28.7* 30.6* 35.0*  MCV 99.7 99.3 97.8 101.4*  PLT 238 193 224 248   Basic Metabolic Panel: Recent Labs  Lab 09/26/24 1320 09/26/24 1524 09/27/24 1205 09/28/24 0545  NA 143  --  144 145  K 4.3  --  4.3 4.5  CL 106  --  111 111  CO2 27  --  25 26  GLUCOSE 80  --  98 91  BUN 43*  --  29* 26*   CREATININE 2.82* 2.61* 1.88* 1.81*  CALCIUM 8.8*  --  9.0 9.1   GFR: Estimated Creatinine Clearance: 35.1 mL/min (A) (by C-G formula based on SCr of 1.81 mg/dL (H)). Liver Function Tests: Recent Labs  Lab 09/26/24 1320 09/27/24 1205  AST 31 29  ALT 20 21  ALKPHOS 111 113  BILITOT 0.4 0.4  PROT 6.1* 5.8*  ALBUMIN 3.4* 3.2*   No results for input(s): LIPASE, AMYLASE in the last 168 hours. Recent Labs  Lab 09/26/24 1837  AMMONIA 24   Coagulation Profile: No results for input(s): INR, PROTIME in the last 168 hours. Cardiac Enzymes: Recent Labs  Lab 09/26/24 1524  CKTOTAL 122   BNP (last 3 results) No results for input(s): PROBNP in the last 8760 hours. HbA1C: No results for input(s): HGBA1C in the last 72 hours. CBG: No results for input(s): GLUCAP in the last 168 hours. Lipid Profile: No results for input(s): CHOL, HDL, LDLCALC,  TRIG, CHOLHDL, LDLDIRECT in the last 72 hours. Thyroid Function Tests: Recent Labs    09/26/24 1524  TSH 0.885   Anemia Panel: Recent Labs    09/26/24 1837  VITAMINB12 864   Sepsis Labs: Recent Labs  Lab 09/26/24 1524 09/26/24 1837 09/26/24 2246  PROCALCITON 0.19  --   --   LATICACIDVEN 1.4 2.3* 1.0    Recent Results (from the past 240 hours)  Resp panel by RT-PCR (RSV, Flu A&B, Covid) Anterior Nasal Swab     Status: None   Collection Time: 09/26/24  1:38 PM   Specimen: Anterior Nasal Swab  Result Value Ref Range Status   SARS Coronavirus 2 by RT PCR NEGATIVE NEGATIVE Final    Comment: (NOTE) SARS-CoV-2 target nucleic acids are NOT DETECTED.  The SARS-CoV-2 RNA is generally detectable in upper respiratory specimens during the acute phase of infection. The lowest concentration of SARS-CoV-2 viral copies this assay can detect is 138 copies/mL. A negative result does not preclude SARS-Cov-2 infection and should not be used as the sole basis for treatment or other patient management decisions. A  negative result may occur with  improper specimen collection/handling, submission of specimen other than nasopharyngeal swab, presence of viral mutation(s) within the areas targeted by this assay, and inadequate number of viral copies(<138 copies/mL). A negative result must be combined with clinical observations, patient history, and epidemiological information. The expected result is Negative.  Fact Sheet for Patients:  bloggercourse.com  Fact Sheet for Healthcare Providers:  seriousbroker.it  This test is no t yet approved or cleared by the [place]  FDA and  has been authorized for detection and/or diagnosis of SARS-CoV-2 by FDA under an Emergency Use Authorization (EUA). This EUA will remain  in effect (meaning this test can be used) for the duration of the COVID-19 declaration under Section 564(b)(1) of the Act, 21 U.S.C.section 360bbb-3(b)(1), unless the authorization is terminated  or revoked sooner.       Influenza A by PCR NEGATIVE NEGATIVE Final   Influenza B by PCR NEGATIVE NEGATIVE Final    Comment: (NOTE) The Xpert Xpress SARS-CoV-2/FLU/RSV plus assay is intended as an aid in the diagnosis of influenza from Nasopharyngeal swab specimens and should not be used as a sole basis for treatment. Nasal washings and aspirates are unacceptable for Xpert Xpress SARS-CoV-2/FLU/RSV testing.  Fact Sheet for Patients: bloggercourse.com  Fact Sheet for Healthcare Providers: seriousbroker.it  This test is not yet approved or cleared by the [place]  FDA and has been authorized for detection and/or diagnosis of SARS-CoV-2 by FDA under an Emergency Use Authorization (EUA). This EUA will remain in effect (meaning this test can be used) for the duration of the COVID-19 declaration under Section 564(b)(1) of the Act, 21 U.S.C. section 360bbb-3(b)(1), unless the authorization  is terminated or revoked.     Resp Syncytial Virus by PCR NEGATIVE NEGATIVE Final    Comment: (NOTE) Fact Sheet for Patients: bloggercourse.com  Fact Sheet for Healthcare Providers: seriousbroker.it  This test is not yet approved or cleared by the [place]  FDA and has been authorized for detection and/or diagnosis of SARS-CoV-2 by FDA under an Emergency Use Authorization (EUA). This EUA will remain in effect (meaning this test can be used) for the duration of the COVID-19 declaration under Section 564(b)(1) of the Act, 21 U.S.C. section 360bbb-3(b)(1), unless the authorization is terminated or revoked.  Performed at [facility], [address] [place], [place] [address]   Respiratory (~20 pathogens) panel by PCR  Status: Abnormal   Collection Time: 09/27/24 11:20 AM   Specimen: Nasopharyngeal Swab; Respiratory  Result Value Ref Range Status   Adenovirus NOT DETECTED NOT DETECTED Final   Coronavirus 229E NOT DETECTED NOT DETECTED Final    Comment: (NOTE) The Coronavirus on the Respiratory Panel, DOES NOT test for the novel  Coronavirus (2019 nCoV)    Coronavirus HKU1 NOT DETECTED NOT DETECTED Final   Coronavirus NL63 NOT DETECTED NOT DETECTED Final   Coronavirus OC43 NOT DETECTED NOT DETECTED Final   Metapneumovirus NOT DETECTED NOT DETECTED Final   Rhinovirus / Enterovirus DETECTED (A) NOT DETECTED Final   Influenza A NOT DETECTED NOT DETECTED Final   Influenza B NOT DETECTED NOT DETECTED Final   Parainfluenza Virus 1 NOT DETECTED NOT DETECTED Final   Parainfluenza Virus 2 NOT DETECTED NOT DETECTED Final   Parainfluenza Virus 3 NOT DETECTED NOT DETECTED Final   Parainfluenza Virus 4 NOT DETECTED NOT DETECTED Final   Respiratory Syncytial Virus NOT DETECTED NOT DETECTED Final   Bordetella pertussis NOT DETECTED NOT DETECTED Final   Bordetella Parapertussis NOT DETECTED NOT DETECTED Final   Chlamydophila  pneumoniae NOT DETECTED NOT DETECTED Final   Mycoplasma pneumoniae NOT DETECTED NOT DETECTED Final    Comment: Performed at [facility], [address], [place], [place] [address]         Radiology Studies: CT Angio Chest Pulmonary Embolism (PE) W or WO Contrast Result Date: 09/27/2024 CLINICAL DATA:  Short of breath, cough EXAM: CT ANGIOGRAPHY CHEST WITH CONTRAST TECHNIQUE: Multidetector CT imaging of the chest was performed using the standard protocol during bolus administration of intravenous contrast. Multiplanar CT image reconstructions and MIPs were obtained to evaluate the vascular anatomy. RADIATION DOSE REDUCTION: This exam was performed according to the departmental dose-optimization program which includes automated exposure control, adjustment of the mA and/or kV according to patient size and/or use of iterative reconstruction technique. CONTRAST:  OMNIPAQUE  IOHEXOL  350 MG/ML SOLN COMPARISON:  09/26/2024 FINDINGS: Cardiovascular: This is a technically adequate evaluation of the pulmonary vasculature. No filling defects or pulmonary emboli. The heart is enlarged without pericardial effusion. 4 cm ascending thoracic aortic aneurysm. No evidence of dissection. Atherosclerosis of the aorta and coronary vasculature. Mediastinum/Nodes: No enlarged mediastinal, hilar, or axillary lymph nodes. Thyroid gland, trachea, and esophagus demonstrate no significant findings. Lungs/Pleura: Trace left pleural effusion. Dependent hypoventilatory changes are seen bilaterally, left greater than right. No acute airspace disease. No pneumothorax. Upper Abdomen: No acute abnormality. Musculoskeletal: No acute or destructive bony abnormalities. Reconstructed images demonstrate no additional findings. Review of the MIP images confirms the above findings. IMPRESSION: 1. No evidence of pulmonary embolus. 2. Cardiomegaly. 3. 4 cm ascending thoracic aortic aneurysm. Recommend annual imaging followup by CTA or  MRA. This recommendation follows 2010 ACCF/AHA/AATS/ACR/ASA/SCA/SCAI/SIR/STS/SVM Guidelines for the Diagnosis and Management of Patients with Thoracic Aortic Disease. Circulation. 2010; 121: Z733-z630. Aortic aneurysm NOS (ICD10-I71.9) 4. Bilateral hypoventilatory changes.  No acute airspace disease. 5. Trace left pleural effusion. 6.  Aortic Atherosclerosis (ICD10-I70.0). Electronically Signed   By: Ozell Daring M.D.   On: 09/27/2024 18:01   ECHOCARDIOGRAM COMPLETE Result Date: 09/27/2024    ECHOCARDIOGRAM REPORT   Patient Name:   KAGEN KUNATH Date of Exam: 09/27/2024 Medical Rec #:  [id number]    Height:       72.0 in Accession #:    [id number]   Weight:       188.1 lb Date of Birth:  16-Sep-1941    BSA:  2.076 m Patient Age:    83 years     BP:           153/84 mmHg Patient Gender: M            HR:           80 bpm. Exam Location:  [facility] Procedure: 2D Echo, Cardiac Doppler and Color Doppler (Both Spectral and Color            Flow Doppler were utilized during procedure). Indications:     Abnormal ECG R94.31  History:         Patient has no prior history of Echocardiogram examinations.                  Risk Factors:Hypertension and Dyslipidemia.  Sonographer:     Christopher Furnace Referring Phys:  [id number] PRENTICE BROCKS CORE Diagnosing Phys: Cara JONETTA Lovelace MD IMPRESSIONS  1. Left ventricular ejection fraction, by estimation, is 60 to 65%. The left ventricle has normal function. The left ventricle has no regional wall motion abnormalities. Left ventricular diastolic parameters are consistent with Grade I diastolic dysfunction (impaired relaxation).  2. Right ventricular systolic function is normal. The right ventricular size is normal.  3. The mitral valve is normal in structure. Trivial mitral valve regurgitation.  4. The aortic valve is normal in structure. Aortic valve regurgitation is mild. FINDINGS  Left Ventricle: Left ventricular ejection fraction, by estimation, is 60 to 65%. The left ventricle has normal  function. The left ventricle has no regional wall motion abnormalities. Strain was performed and the global longitudinal strain is indeterminate. The left ventricular internal cavity size was normal in size. There is borderline left ventricular hypertrophy. Left ventricular diastolic parameters are consistent with Grade I diastolic dysfunction (impaired relaxation). Right Ventricle: The right ventricular size is normal. No increase in right ventricular wall thickness. Right ventricular systolic function is normal. Left Atrium: Left atrial size was normal in size. Right Atrium: Right atrial size was normal in size. Pericardium: There is no evidence of pericardial effusion. Mitral Valve: The mitral valve is normal in structure. Trivial mitral valve regurgitation. MV peak gradient, 7.0 mmHg. The mean mitral valve gradient is 2.0 mmHg. Tricuspid Valve: The tricuspid valve is normal in structure. Tricuspid valve regurgitation is trivial. Aortic Valve: The aortic valve is normal in structure. Aortic valve regurgitation is mild. Aortic valve mean gradient measures 5.0 mmHg. Aortic valve peak gradient measures 8.8 mmHg. Aortic valve area, by VTI measures 3.96 cm. Pulmonic Valve: The pulmonic valve was not well visualized. Pulmonic valve regurgitation is not visualized. Aorta: The aortic root was not well visualized. IAS/Shunts: No atrial level shunt detected by color flow Doppler. Additional Comments: 3D was performed not requiring image post processing on an independent workstation and was normal.  LEFT VENTRICLE PLAX 2D LVIDd:         4.40 cm   Diastology LVIDs:         3.00 cm   LV e' medial:    12.70 cm/s LV PW:         1.10 cm   LV E/e' medial:  9.8 LV IVS:        1.20 cm   LV e' lateral:   8.05 cm/s LVOT diam:     2.10 cm   LV E/e' lateral: 15.5 LV SV:         120 LV SV Index:   58 LVOT Area:     3.46 cm LV IVRT:  92 msec  RIGHT VENTRICLE RV Basal diam:  3.80 cm RV Mid diam:    3.00 cm RV S prime:     15.40  cm/s TAPSE (M-mode): 2.6 cm LEFT ATRIUM           Index        RIGHT ATRIUM           Index LA diam:      3.40 cm 1.64 cm/m   RA Area:     21.80 cm LA Vol (A4C): 51.2 ml 24.67 ml/m  RA Volume:   68.90 ml  33.20 ml/m  AORTIC VALVE AV Area (Vmax):    3.14 cm AV Area (Vmean):   3.16 cm AV Area (VTI):     3.96 cm AV Vmax:           148.00 cm/s AV Vmean:          99.500 cm/s AV VTI:            0.303 m AV Peak Grad:      8.8 mmHg AV Mean Grad:      5.0 mmHg LVOT Vmax:         134.00 cm/s LVOT Vmean:        90.700 cm/s LVOT VTI:          0.346 m LVOT/AV VTI ratio: 1.14  AORTA Ao Root diam: 4.00 cm MITRAL VALVE                TRICUSPID VALVE MV Area (PHT): 3.27 cm     TR Peak grad:   6.6 mmHg MV Area VTI:   2.91 cm     TR Vmax:        128.00 cm/s MV Peak grad:  7.0 mmHg MV Mean grad:  2.0 mmHg     SHUNTS MV Vmax:       1.32 m/s     Systemic VTI:  0.35 m MV Vmean:      69.8 cm/s    Systemic Diam: 2.10 cm MV Decel Time: 232 msec MV E velocity: 125.00 cm/s MV A velocity: 144.00 cm/s MV E/A ratio:  0.87 Dwayne D Callwood MD Electronically signed by Cara JONETTA Lovelace MD Signature Date/Time: 09/27/2024/4:39:43 PM    Final    US  Venous Img Lower Bilateral (DVT) Result Date: 09/26/2024 CLINICAL DATA:  Positive D-dimer. EXAM: BILATERAL LOWER EXTREMITY VENOUS DOPPLER ULTRASOUND TECHNIQUE: Gray-scale sonography with compression, as well as color and duplex ultrasound, were performed to evaluate the deep venous system(s) from the level of the common femoral vein through the popliteal and proximal calf veins. COMPARISON:  None Available. FINDINGS: VENOUS Normal compressibility of the common femoral, superficial femoral, and popliteal veins, as well as the visualized calf veins. Visualized portions of profunda femoral vein and great saphenous vein unremarkable. No filling defects to suggest DVT on grayscale or color Doppler imaging. Doppler waveforms show normal direction of venous flow, normal respiratory plasticity and  response to augmentation. Limited views of the contralateral common femoral vein are unremarkable. OTHER Fluid collection is seen in the left popliteal fossa measuring 5.5 x 1.5 x 4.2 cm. Limitations: none IMPRESSION: 1. No evidence of deep venous thrombosis bilaterally. 2. Fluid collection in the left popliteal fossa, suggesting Baker's cyst. Electronically Signed   By: Leita Birmingham M.D.   On: 09/26/2024 18:14   US  RENAL Result Date: 09/26/2024 CLINICAL DATA:  Acute kidney injury. EXAM: RENAL / URINARY TRACT ULTRASOUND COMPLETE COMPARISON:  None Available. FINDINGS: Right Kidney:  Renal measurements: 9.6 cm x 5.4 cm x 4.7 cm = volume: 127.9 mL. There is diffusely increased echogenicity of the renal parenchyma. A 1.6 cm x 1.6 cm x 1.3 cm simple cyst is seen within the upper pole of the right kidney. No hydronephrosis is visualized. Left Kidney: Renal measurements: 9.6 cm x 5.4 cm x 4.9 cm = volume: 133.6 mL. There is diffusely increased echogenicity of the renal parenchyma. 4.0 cm x 3.8 cm x 3.5 cm and 5.6 cm x 4.9 cm x 5.5 cm simple renal cysts are seen within the left kidney. No hydronephrosis is visualized. Bladder: Appears normal for degree of bladder distention. Other: Of incidental note is the presence of a 4.1 cm x 4.4 cm x 3.8 cm hepatic cysts. IMPRESSION: 1. Large hepatic and bilateral simple renal cysts. 2. Bilateral echogenic kidneys which may represent sequelae associated with medical renal disease. Electronically Signed   By: Suzen Dials M.D.   On: 09/26/2024 15:43   CT Head Wo Contrast Result Date: 09/26/2024 CLINICAL DATA:  Altered mental status. EXAM: CT HEAD WITHOUT CONTRAST TECHNIQUE: Contiguous axial images were obtained from the base of the skull through the vertex without intravenous contrast. RADIATION DOSE REDUCTION: This exam was performed according to the departmental dose-optimization program which includes automated exposure control, adjustment of the mA and/or kV according to  patient size and/or use of iterative reconstruction technique. COMPARISON:  08/22/2024 FINDINGS: Brain: No evidence of intracranial hemorrhage, acute infarction, hydrocephalus, extra-axial collection, or mass lesion/mass effect. Mild-to-moderate diffuse cerebral atrophy and chronic small vessel disease show no significant change. Vascular:  No hyperdense vessel or other acute findings. Skull: No evidence of fracture or other significant bone abnormality. Sinuses/Orbits:  No acute findings. Other: None. IMPRESSION: No acute intracranial abnormality. Stable cerebral atrophy and chronic small vessel disease. Electronically Signed   By: Norleen DELENA Kil M.D.   On: 09/26/2024 13:57   DG Chest 1 View Result Date: 09/26/2024 CLINICAL DATA:  Cough. EXAM: CHEST  1 VIEW COMPARISON:  August 22, 2024 FINDINGS: The heart size and mediastinal contours are within normal limits. Low lung volumes are noted with mild, stable elevation of the left hemidiaphragm. Mild atelectasis is seen within the left lung base. No pleural effusion or pneumothorax is identified. Multilevel degenerative changes are seen throughout the thoracic spine. IMPRESSION: Low lung volumes with mild left basilar atelectasis. Electronically Signed   By: Suzen Dials M.D.   On: 09/26/2024 13:41        Scheduled Meds:  allopurinol   100 mg Oral Daily   cyanocobalamin   1,000 mcg Oral Daily   enoxaparin  (LOVENOX ) injection  40 mg Subcutaneous Q24H   folic acid   1 mg Oral Daily   [name]  1 Application Topical TID   pravastatin   40 mg Oral Daily   Continuous Infusions:     LOS: 2 days       Anthony CHRISTELLA Pouch, MD [facility] Pager [phone]  If 7PM-7AM, please contact night-coverage [email or web address] Password [email or web address] 09/28/2024, 9:09 AM   "

## 2024-09-29 ENCOUNTER — Ambulatory Visit: Admitting: Nurse Practitioner

## 2024-09-29 DIAGNOSIS — G929 Unspecified toxic encephalopathy: Secondary | ICD-10-CM | POA: Diagnosis not present

## 2024-09-29 DIAGNOSIS — D62 Acute posthemorrhagic anemia: Secondary | ICD-10-CM | POA: Diagnosis not present

## 2024-09-29 DIAGNOSIS — Z7189 Other specified counseling: Secondary | ICD-10-CM | POA: Diagnosis not present

## 2024-09-29 LAB — BASIC METABOLIC PANEL WITH GFR
Anion gap: 11 (ref 5–15)
BUN: 35 mg/dL — ABNORMAL HIGH (ref 8–23)
CO2: 25 mmol/L (ref 22–32)
Calcium: 9.1 mg/dL (ref 8.9–10.3)
Chloride: 108 mmol/L (ref 98–111)
Creatinine, Ser: 1.99 mg/dL — ABNORMAL HIGH (ref 0.61–1.24)
GFR, Estimated: 33 mL/min — ABNORMAL LOW
Glucose, Bld: 155 mg/dL — ABNORMAL HIGH (ref 70–99)
Potassium: 4.3 mmol/L (ref 3.5–5.1)
Sodium: 144 mmol/L (ref 135–145)

## 2024-09-29 LAB — CBC
HCT: 34.3 % — ABNORMAL LOW (ref 39.0–52.0)
Hemoglobin: 10.7 g/dL — ABNORMAL LOW (ref 13.0–17.0)
MCH: 30.3 pg (ref 26.0–34.0)
MCHC: 31.2 g/dL (ref 30.0–36.0)
MCV: 97.2 fL (ref 80.0–100.0)
Platelets: 300 K/uL (ref 150–400)
RBC: 3.53 MIL/uL — ABNORMAL LOW (ref 4.22–5.81)
RDW: 13.5 % (ref 11.5–15.5)
WBC: 7.3 K/uL (ref 4.0–10.5)
nRBC: 0 % (ref 0.0–0.2)

## 2024-09-29 MED ORDER — AMLODIPINE BESYLATE 10 MG PO TABS
10.0000 mg | ORAL_TABLET | Freq: Every day | ORAL | Status: DC
  Administered 2024-09-29 – 2024-10-01 (×3): 10 mg via ORAL
  Filled 2024-09-29 (×3): qty 1

## 2024-09-29 MED ORDER — SODIUM CHLORIDE 0.9 % IV SOLN: INTRAVENOUS | Status: AC

## 2024-09-29 NOTE — Progress Notes (Signed)
 " Progress Note   Patient: Jason Estrada FMW:[id number] DOB: [birth year] DOA: 09/26/2024     3 DOS: the patient was seen and examined on 09/29/2024   Brief hospital course:  Mr. Porte is a 84 year old gentleman with a past medical history of dementia, prostate cancer gastroesophageal reflux disease, hypertension, chronic kidney disease stage IIIb and recent hospitalization for sepsis secondary to olecranon bursitis discharged 08/26/2024 who presented to the emergency department with mental status change presenting as inability to be aroused and somnolence.  The patient's wife [name] at the bedside provides most of the history.  She reports the patient was recently discharged from inpatient rehab after a 30-day short-term rehabilitation stay.  The patient was noted to have over the last 4 to 5 days a cough but continue to eat and drink normally as per the spouse.  She had been bringing in protein drinks and on the day of discharge from the rehab yesterday the patient had an episode of sundowning and confusion requiring extra time to arouse him.  Of note the patient had issues with inability to function with rehabilitation well at the facility due to the fact he was given Seroquel  and was overly sedated.  When the patient's home health care nurse was unable to arouse him this morning the activated EMS.  Of note the patient was reportedly bradycardic in the 40s with systolic pressure in the 100s when EMS arrived with pinpoint pupils as well as desaturation and reportedly responded well to Narcan although the patient's wife reports no opiates in the house and the patient is dependent on her for medications.  The patient had also been receiving aggressive diuresis reportedly with Lasix  for dependent edema which patient's wife reports is significantly improved now.  Patient is confused at baseline although the patient's wife believes he is now at his mental baseline.  Patient's wife cannot recall any past  echocardiograms or diagnosis of heart failure   Case discussed with emergency department provider and patient subsequently placed in admission.      Assessment and Plan:  Toxic encephalopathy:  History of Dementia Etiology unclear and thought to be medication induced.  Patient was on baclofen  which is currently on hold Patient presented to the ER for evaluation of unresponsiveness and increased somnolence Patient is currently awake, alert and oriented to person, not to place or time     Rhinovirus/enterovirus infection:  Continue supportive care Will discontinue IV steroids Continue bronchodilator therapy and incentive spirometry     Bradycardia:  Probably medication induced Continue to hold verapamil  and donepezil     Gout:  Continue allopurinol      AKI on CKDIV:  Baseline serum creatinine of 1.7. Creatinine up trended to 2.6 and shows a downward trend Etiology may be prerenal and may be related to diuretic use Continue IV fluid hydration     Lower extremity edema:  US  of b/l LE was neg.  CTA chest ordered b/c d-dimer was elevated.  No evidence of PE Echo shows EF 60-65%, grade I diastolic dysfunction, no regional wall motion abnormalities     Anemia of chronic kidney disease Likely secondary to CKD. No need for a transfusion currently    Ascending thoracic aortic aneurysm:  4 cm and incidental finding on CTA chest.  Will need repeat imagining in 1 years w/ CTA or MRA.     Elevated troponin:  trending down.  Likely secondary to demand ischemia       Dementia:  With behavioral disturbance.  Donepezil  is on  hold secondary to bradycardia.  Haldol  prn for agitation.  Palliative care consulted for goals of care    GERD:  Continue on PPI     Prostate cancer: Follow-up with oncology as an outpatient     HTN: Will start patient on amlodipine  Verapamil  is on hold due to bradycardia           Subjective: Sitting up in a chair.  Oriented to  person.      Physical Exam: Vitals:   09/28/24 2118 09/29/24 0033 09/29/24 0506 09/29/24 0720  BP: (!) 157/61 (!) 143/78 (!) 162/83   Pulse: 93 82 86   Resp: (!) 39 13 18   Temp: 97.8 F (36.6 C) 98.1 F (36.7 C) 98.4 F (36.9 C)   TempSrc:  Oral    SpO2: 100% 94% 98% 95%  Weight:      Height:       General exam: Appears comfortable and in no distress  Respiratory system: Bilateral air entry Cardiovascular system: S1/S2+. No rubs or clicks   Gastrointestinal system: Abd is soft, NT, ND & hypoactive bowel sounds  Central nervous system: Awake and alert Psychiatry: Judgement and insight appears poor. Flat mood and affect        Data Reviewed: Labs reviewed. BUN 35, creatinine 1.9, hemoglobin 10.7 Labs reviewed  Family Communication: None  Disposition: Status is: Inpatient Remains inpatient appropriate because: Discharge planning  Planned Discharge Destination: TBD    Time spent: 30 minutes  Author: Aimee Somerset, MD 09/29/2024 1:39 PM  For on call review [email or web address].  "

## 2024-09-29 NOTE — Evaluation (Signed)
 Physical Therapy Evaluation Patient Details Name: Jason Estrada MRN: [id number] DOB: 05/21/1941 Today's Date: 09/29/2024  History of Present Illness  presented to ER secondary to AMS, decreased alertness; admitted for management of toxic metabolic encephalopathy (? related to baclofen ?).  Of note, was recently hospitalized (12/25) due to olecranon bursitis and transitioned to STR from hospital; home x1-2 days prior to this admission.  Clinical Impression  Patient resting in bed upon arrival to room; oriented to self only.  Inconsistently follows simple commands, generally pleasant and cooperative; however unable to redirect to additional tasks once patient targets return to bed. Bilat UE/LE strength and ROM grossly symmetrical and WFL for basic transfers and gait; chronic, arthritic changes noted to bilat LEs, but no focal weakness appreciated.  Currently requiring close sup for bed mobility; min assist for sit/stand and static standing balance with RW.  Maintains standing x3-5 seconds each trial (x3) before spontaneously sitting back to bed and ultimately returning self to supine.  Unable to redirect for additional participation at this time.  Will continue to assess/progress in subsequent session as appropriate.  Do anticipate need for +2 with progressive mobility efforts due to significant cognitive deficits and limited ability to redirect if patient targets task other than requested. Would benefit from skilled PT to address above deficits and promote optimal return to PLOF; recommend post-acute PT follow up as indicated by interdisciplinary care team, pending ability to participate and progress with skilled interventions.  If unable to participate/progress, or if family unable to manage care post-rehab efforts, may be appropriate for consideration of LTC/memory care.         If plan is discharge home, recommend the following: A little help with walking and/or transfers;Two people to help with  walking and/or transfers   Can travel by private vehicle        Equipment Recommendations Rolling walker (2 wheels)  Recommendations for Other Services       Functional Status Assessment Patient has had a recent decline in their functional status and demonstrates the ability to make significant improvements in function in a reasonable and predictable amount of time.     Precautions / Restrictions Precautions Precautions: Fall Restrictions Weight Bearing Restrictions Per Provider Order: No      Mobility  Bed Mobility Overal bed mobility: Needs Assistance Bed Mobility: Supine to Sit, Sit to Supine     Supine to sit: Supervision Sit to supine: Modified independent (Device/Increase time)        Transfers   Equipment used: Rolling walker (2 wheels) Transfers: Sit to/from Stand Sit to Stand: Min assist           General transfer comment: sit/stand x3 with RW, cuing for hand placement; poor carry-over of cuing between trials.  Spontaneously sits after 3-5 seconds of each standing trial; unable redirect for sustained standing, continued participation    Ambulation/Gait               General Gait Details: unsafe/unable, patient spontaneously returning self to bed  Stairs            Wheelchair Mobility     Tilt Bed    Modified Rankin (Stroke Patients Only)       Balance Overall balance assessment: Needs assistance Sitting-balance support: No upper extremity supported, Feet supported Sitting balance-Leahy Scale: Good     Standing balance support: Bilateral upper extremity supported Standing balance-Leahy Scale: Fair  Pertinent Vitals/Pain Pain Assessment Pain Assessment: Faces Faces Pain Scale: No hurt    Home Living Family/patient expects to be discharged to:: Private residence Living Arrangements: Spouse/significant other Available Help at Discharge: Family Type of Home: House Home Access:  Stairs to enter Entrance Stairs-Rails: None Entrance Stairs-Number of Steps: 1   Home Layout: One level   Additional Comments: Patient poor historian and unable to provide accurate information; social history taken from previous documentation (12/25)    Prior Function               Mobility Comments: Per previous documentation, was ambulatory with [facility] prior to admission in December; unable to articulate current situation.  Will verify with wife as available.       Extremity/Trunk Assessment   Upper Extremity Assessment Upper Extremity Assessment: Overall WFL for tasks assessed    Lower Extremity Assessment Lower Extremity Assessment: Overall WFL for tasks assessed (grossly at least 4/5 throughout; chronic, arthritic changes to bilat knees (R > L))       Communication   Communication Communication: No apparent difficulties    Cognition Arousal: Alert Behavior During Therapy: Impulsive   PT - Cognitive impairments: No family/caregiver present to determine baseline                       PT - Cognition Comments: Oriented to self only; follows some simple commands, generally pleasant and cooperative, but unable to redirect once he decides on a task.  Generally impulsive with poor insight into deficits, safety needs Following commands: Impaired Following commands impaired: Follows one step commands inconsistently     Cueing Cueing Techniques: Verbal cues, Gestural cues, Tactile cues     General Comments      Exercises     Assessment/Plan    PT Assessment Patient needs continued PT services  PT Problem List Decreased activity tolerance;Decreased balance;Decreased mobility;Decreased coordination;Decreased cognition;Decreased knowledge of use of DME;Decreased safety awareness;Decreased knowledge of precautions       PT Treatment Interventions DME instruction;Gait training;Functional mobility training;Therapeutic activities;Therapeutic exercise;Balance  training;Neuromuscular re-education;Cognitive remediation;Patient/family education    PT Goals (Current goals can be found in the Care Plan section)  Acute Rehab PT Goals PT Goal Formulation: Patient unable to participate in goal setting Time For Goal Achievement: 10/13/24 Potential to Achieve Goals: Fair    Frequency Min 1X/week (trial)     Co-evaluation               AM-PAC PT 6 Clicks Mobility  Outcome Measure Help needed turning from your back to your side while in a flat bed without using bedrails?: None Help needed moving from lying on your back to sitting on the side of a flat bed without using bedrails?: None Help needed moving to and from a bed to a chair (including a wheelchair)?: A Little Help needed standing up from a chair using your arms (e.g., wheelchair or bedside chair)?: A Little Help needed to walk in hospital room?: A Lot Help needed climbing 3-5 steps with a railing? : A Lot 6 Click Score: 18    End of Session Equipment Utilized During Treatment: Gait belt Activity Tolerance: Patient tolerated treatment well Patient left: in bed;with call bell/phone within reach;with bed alarm set Nurse Communication: Mobility status PT Visit Diagnosis: Muscle weakness (generalized) (M62.81);Difficulty in walking, not elsewhere classified (R26.2)    Time: 8589-8574 PT Time Calculation (min) (ACUTE ONLY): 15 min   Charges:   PT Evaluation $PT Eval Moderate Complexity: 1  Mod   PT General Charges $$ ACUTE PT VISIT: 1 Visit         Zedrick Springsteen H. Delores, PT, DPT, NCS 09/29/2024, 2:57 PM [phone]

## 2024-09-29 NOTE — Progress Notes (Signed)
 "                                                                                                                                                                                                          Daily Progress Note   Patient Name: Jason Estrada       Date: 09/29/2024 DOB: March 29, 1941  Age: 84 y.o. MRN#: [id number] Attending Physician: Lanetta Lingo, MD Primary Care Physician: [name], NP Admit Date: 09/26/2024  Reason for Consultation/Follow-up: Establishing goals of care  Subjective: Notes and labs reviewed.  In to see patient.  He is currently sitting in bedside chair with TeleSitter present.  His wife is present at bedside.  He greets me and identifies the woman in the room as his wife.  Attempted to converse further and he is unable to state if he has children, where he lives or what city he is in.  He is able to say that he is at the hospital.  Stepped out with patient's wife to talk.  She states that he has sons from a previous marriage, and she states that their relationship has always been odd, and they have not spoken with either son since last April.  She discusses her last conversation with the signs and the dynamics.  She states at baseline [place]on has always had a flat affect.  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  We revisited discussion from yesterday.  She discusses his increased appetite.  She discusses that he wanders at night and is restless.   We discussed in great detail his diagnoses, prognosis, GOC, EOL wishes disposition and options.  Created space and opportunity for patient  to explore thoughts and feelings regarding current medical information.   A detailed discussion was had today regarding advanced directives.  Concepts specific to code status, artifical feeding and hydration, IV antibiotics and rehospitalization were discussed.  The difference between an aggressive medical intervention path and a comfort care path was discussed.  Values  and goals of care important to patient and family were attempted to be elicited.  Discussed limitations of medical interventions to prolong quality of life in some situations and discussed the concept of human mortality.  We discussed what he would consider acceptable quality of life.  She states she and her husband both filled out living wills prior to moving here from [place] , and again upon moving here from [place] .  She states she is healthcare power of attorney for her husband.  She states he has always wanted to die naturally with no feeding  tubes and a focus on comfort.  She states she marked on her form that she would be amenable to a feeding tube.  Answered questions regarding: symptom management, various scenarios, care moving forward, questions regarding hospice and palliative, and answered questions as able regarding assisted living and memory care facility placement.  She states she will consider her wishes as well as if she would like to speak with patient's sons.  Discussed follow-up again tomorrow.    Length of Stay: 3  Current Medications: Scheduled Meds:   allopurinol   100 mg Oral Daily   amLODipine   10 mg Oral Daily   cyanocobalamin   1,000 mcg Oral Daily   diphenhydrAMINE   50 mg Oral QHS   enoxaparin  (LOVENOX ) injection  40 mg Subcutaneous Q24H   folic acid   1 mg Oral Daily   [name]  1 Application Topical TID   guaiFENesin   600 mg Oral BID   pravastatin   40 mg Oral Daily    Continuous Infusions:  sodium chloride       PRN Meds: acetaminophen  **OR** acetaminophen , albuterol , benzonatate , chlorpheniramine-HYDROcodone , haloperidol  lactate, ondansetron  **OR** ondansetron  (ZOFRAN ) IV, senna-docusate  Physical Exam Pulmonary:     Effort: Pulmonary effort is normal.  Neurological:     Mental Status: He is alert.             Vital Signs: BP (!) 162/83 (BP Location: Right Arm)   Pulse 86   Temp 98.4 F (36.9 C)   Resp 18   Ht 6' (1.829 m)   Wt  85.3 kg   SpO2 95%   BMI 25.50 kg/m  SpO2: SpO2: 95 % O2 Device: O2 Device: Room Air O2 Flow Rate: O2 Flow Rate (L/min): 1 L/min  Intake/output summary:  Intake/Output Summary (Last 24 hours) at 09/29/2024 1236 Last data filed at 09/29/2024 0900 Gross per 24 hour  Intake 480 ml  Output --  Net 480 ml   LBM: Last BM Date : 09/28/24 Baseline Weight: Weight: 85.3 kg Most recent weight: Weight: 85.3 kg   Patient Active Problem List   Diagnosis Date Noted   Toxic encephalopathy 09/26/2024   Bradycardia 09/26/2024   Dementia with behavioral disturbance (HCC) 09/26/2024   Cough 09/26/2024   Troponin level elevated 09/26/2024   Anemia due to chronic kidney disease 09/26/2024   Lower extremity edema 09/26/2024   Sepsis due to olecranon bursitis 08/26/2024   Pressure injury of skin 08/26/2024   SIRS (systemic inflammatory response syndrome) (HCC) 08/23/2024   Vitamin B12 deficiency 02/18/2023   GERD (gastroesophageal reflux disease) 02/18/2023   Chronic pain of right knee 08/20/2022   Primary hyperparathyroidism 08/18/2022   Gout 02/13/2022   Prostate cancer 10/22/2021   Sinus tachycardia 09/26/2021   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  Code Status:    Code Status Orders  (From admission, onward)           Start     Ordered   09/26/24 1455  Do not attempt resuscitation (DNR)- Limited -Do Not Intubate (DNI)  Continuous       Question Answer Comment  If pulseless and not breathing No CPR or chest compressions.   In Pre-Arrest Conditions (Patient Is Breathing and Has A Pulse) Do not intubate. Provide all appropriate non-invasive  medical interventions. Avoid ICU transfer unless indicated or required.    Consent: Discussion documented in EHR or advanced directives reviewed      09/26/24 1455           Code Status History     Date Active Date Inactive Code Status Order ID Comments User Context   08/23/2024 0045 08/26/2024 1850 Full Code [id number]  [name], MD ED   04/22/2021 1335 04/23/2021 2215 Full Code [id number]  Jeryl Mardene RAMAN, DO Inpatient      Advance Directive Documentation    Flowsheet Row Most Recent Value  Type of Advance Directive Out of facility DNR (pink MOST or yellow form)  Pre-existing out of facility DNR order (yellow form or pink MOST form) Yellow form placed in chart (order not valid for inpatient use)  MOST Form in Place? --    Thank you for allowing the Palliative Medicine Team to assist in the care of this patient.   Time In: 10:15 Time Out: 11:45 Total Time 90 min Prolonged Time Billed  yes       Greater than 50%  of this time was spent counseling and coordinating care related to the above assessment and plan.  Camelia Lewis, NP  Please contact Palliative Medicine Team phone at [phone] for questions and concerns.       "

## 2024-09-29 NOTE — Evaluation (Signed)
 Occupational Therapy Evaluation Patient Details Name: Jason Estrada MRN: [id number] DOB: [birth year] Today's Date: 09/29/2024   History of Present Illness   Jason Estrada is a 84 year old gentleman with a past medical history of dementia, prostate cancer gastroesophageal reflux disease, hypertension, chronic kidney disease stage IIIb and recent hospitalization for sepsis secondary to olecranon bursitis discharged 08/26/2024 who presented to the emergency department with mental status change presenting as inability to be aroused and somnolence. Wife Ezell reported at admission that the patient was recently discharged from inpatient rehab after a 30-day short-term rehabilitation stay. The patient was noted to have over the last 4 to 5 days a cough but continue to eat and drink normally as per the spouse.     Clinical Impressions Patient received for OT evaluation. See flowsheet below for details of function. Generally, patient requiring MOD (I) for bed mobility, CGA with RW for functional transfer to recliner with RW, and setup- MIN A for ADLs. Pt coughing during session and demonstrating decreased activity tolerance.  Patient will benefit from continued OT while in acute care.       If plan is discharge home, recommend the following:   A little help with walking and/or transfers;A little help with bathing/dressing/bathroom;Assistance with cooking/housework;Direct supervision/assist for medications management;Direct supervision/assist for financial management;Assist for transportation;Help with stairs or ramp for entrance;Supervision due to cognitive status     Functional Status Assessment   Patient has had a recent decline in their functional status and demonstrates the ability to make significant improvements in function in a reasonable and predictable amount of time.     Equipment Recommendations    (BSC over toilet if pt does not already have one.)     Recommendations for Other  Services         Precautions/Restrictions   Precautions Precautions: Fall;Other (comment) (droplet precautions) Recall of Precautions/Restrictions: Impaired Restrictions Weight Bearing Restrictions Per Provider Order: No     Mobility Bed Mobility Overal bed mobility: Needs Assistance Bed Mobility: Supine to Sit, Sit to Supine     Supine to sit: Supervision, HOB elevated Sit to supine: Supervision   General bed mobility comments: extra time, cues, supervision for safety.    Transfers Overall transfer level: Needs assistance Equipment used: Rolling walker (2 wheels) Transfers: Sit to/from Stand, Bed to chair/wheelchair/BSC Sit to Stand: Contact guard assist     Step pivot transfers: Contact guard assist     General transfer comment: cues for RW use      Balance Overall balance assessment: Needs assistance Sitting-balance support: Feet supported Sitting balance-Leahy Scale: Good     Standing balance support: Single extremity supported Standing balance-Leahy Scale: Fair                             ADL either performed or assessed with clinical judgement   ADL Overall ADL's : Needs assistance/impaired Eating/Feeding: Set up Eating/Feeding Details (indicate cue type and reason): Pt had eaten most of his breakfast tray when OT arrived. Grooming: Set up;Contact guard assist;Standing Grooming Details (indicate cue type and reason): standing at sink (CGA for safety), pt requiring cue for wetting toothbrush (OT turning on water), but otherwise able to complete oral hygiene without assistance. Standing x3 minutes pt appearing short of breath; O2 saturation 88-91% on room air. Slight tremor noted during oral hygiene activity.         Upper Body Dressing : Minimal assistance Upper Body Dressing Details (indicate cue type and  reason): OT assisted pt to change gown (as his was wet) while seated in recliner. Anticipate if it was a shirt he was familiar with,  he would've been able to don without assist.       Toilet Transfer Details (indicate cue type and reason): simulated with t/f to recliner; pt required assist for cues to reach back for safety. CGA.         Functional mobility during ADLs: Contact guard assist;Rolling walker (2 wheels);Cueing for safety General ADL Comments: Anticipate pt to require CGA for dresssing, bathing (with intermittent cues for safety and use of shower chair).     Vision Baseline Vision/History: 1 Wears glasses Ability to See in Adequate Light: 0 Adequate Patient Visual Report: No change from baseline       Perception         Praxis         Pertinent Vitals/Pain Pain Assessment Pain Assessment: No/denies pain     Extremity/Trunk Assessment Upper Extremity Assessment Upper Extremity Assessment: Overall WFL for tasks assessed   Lower Extremity Assessment Lower Extremity Assessment: Generalized weakness   Cervical / Trunk Assessment Cervical / Trunk Assessment: Normal   Communication Communication Communication: No apparent difficulties   Cognition Arousal: Alert Behavior During Therapy: WFL for tasks assessed/performed Cognition: History of cognitive impairments             OT - Cognition Comments: Pt with hx of dementia. Able to follow one-step commands consistently throughout session. Pt able to provide wife's name. Able to use RW with cues during session. Cognitively able to brush teeth; needing OT to turn on sink water as a cue to wet toothbrush first.                 Following commands: Intact       Cueing  General Comments   Cueing Techniques: Verbal cues  Pt on room air when OT arrived. Coughing intermittently during session. After standing at sink x3 minutes, appearing short of breath and pt endorsing a little shortness of breath. O2 sat after activity 88-91 on finger pulseox. Bruise on L leg.   Exercises     Shoulder Instructions      Home Living  Family/patient expects to be discharged to:: Private residence Living Arrangements: Spouse/significant other Available Help at Discharge: Family Type of Home: House Home Access: Stairs to enter Secretary/administrator of Steps: 1 Entrance Stairs-Rails: None Home Layout: One level               Home Equipment: Cane - single point;Standard Environmental Consultant;Shower seat - built in   Additional Comments: PLOF information from chart review, as wife not in room to provide information. Pt was home from inpatient rehab for 1 day prior to this hospital admission.      Prior Functioning/Environment Prior Level of Function : Needs assist  Cognitive Assist : Mobility (cognitive);ADLs (cognitive) Mobility (Cognitive): Set up cues ADLs (Cognitive): Step by step cues Physical Assist : Mobility (physical);ADLs (physical)     Mobility Comments: In December pt admitted and was (I) with use of cane prior to that admission; pt reports today that he was using a walker most recently; wife not in room to corroborate. ADLs Comments: Medical record states that pt is (I) with dressing and feeding; wife assists for showering.  Unknown other ADLs; wife not present to provide history and pt has dementia.    OT Problem List: Impaired balance (sitting and/or standing);Decreased safety awareness;Decreased strength   OT Treatment/Interventions:  Self-care/ADL training;Therapeutic exercise;DME and/or AE instruction;Patient/family education;Therapeutic activities      OT Goals(Current goals can be found in the care plan section)   Acute Rehab OT Goals Patient Stated Goal: Feel better OT Goal Formulation: With patient Time For Goal Achievement: 10/13/24 Potential to Achieve Goals: Good ADL Goals Pt Will Perform Grooming: with modified independence;standing Pt Will Perform Lower Body Dressing: with modified independence;sit to/from stand Pt Will Transfer to Toilet: with modified independence;ambulating;bedside commode    OT Frequency:  Min 2X/week    Co-evaluation              AM-PAC OT 6 Clicks Daily Activity     Outcome Measure Help from another person eating meals?: None Help from another person taking care of personal grooming?: A Little Help from another person toileting, which includes using toliet, bedpan, or urinal?: A Little Help from another person bathing (including washing, rinsing, drying)?: A Little Help from another person to put on and taking off regular upper body clothing?: A Little Help from another person to put on and taking off regular lower body clothing?: A Little 6 Click Score: 19   End of Session Equipment Utilized During Treatment: Rolling walker (2 wheels);Gait belt Nurse Communication: Mobility status  Activity Tolerance: Patient tolerated treatment well;Patient limited by fatigue Patient left: in chair;with call bell/phone within reach;with chair alarm set biomedical scientist)  OT Visit Diagnosis: Muscle weakness (generalized) (M62.81)                Time: 9057-8993 OT Time Calculation (min): 24 min Charges:  OT General Charges $OT Visit: 1 Visit OT Evaluation $OT Eval Moderate Complexity: 1 Mod OT Treatments $Self Care/Home Management : 8-22 mins  Jasmine Arlean Shams, MS, OTR/L  Jasmine Shams 09/29/2024, 10:29 AM

## 2024-09-30 DIAGNOSIS — G929 Unspecified toxic encephalopathy: Secondary | ICD-10-CM | POA: Diagnosis not present

## 2024-09-30 LAB — BASIC METABOLIC PANEL WITH GFR
Anion gap: 10 (ref 5–15)
BUN: 42 mg/dL — ABNORMAL HIGH (ref 8–23)
CO2: 25 mmol/L (ref 22–32)
Calcium: 9.2 mg/dL (ref 8.9–10.3)
Chloride: 108 mmol/L (ref 98–111)
Creatinine, Ser: 1.99 mg/dL — ABNORMAL HIGH (ref 0.61–1.24)
GFR, Estimated: 33 mL/min — ABNORMAL LOW
Glucose, Bld: 94 mg/dL (ref 70–99)
Potassium: 4 mmol/L (ref 3.5–5.1)
Sodium: 142 mmol/L (ref 135–145)

## 2024-09-30 MED ORDER — SODIUM CHLORIDE 0.9 % IV SOLN: INTRAVENOUS | Status: AC

## 2024-09-30 NOTE — NC FL2 (Signed)
 " [place]  MEDICAID FL2 LEVEL OF CARE FORM     IDENTIFICATION  Patient Name: Jason Estrada Birthdate: 02-Oct-1940 Sex: male Admission Date (Current Location): 09/26/2024  [facility] and [place] Number:  Chiropodist and Address:  [facility], [address] [place], [place] [address]      Provider Number: [id number]  Attending Physician Name and Address:  Lanetta Lingo, MD  Relative Name and Phone Number:  Leelynd Maldonado- wife- [phone]    Current Level of Care: Hospital Recommended Level of Care: Skilled Nursing Facility Prior Approval Number:    Date Approved/Denied:   PASRR Number: [id number]  Discharge Plan: SNF    Current Diagnoses: Patient Active Problem List   Diagnosis Date Noted   Toxic encephalopathy 09/26/2024   Bradycardia 09/26/2024   Dementia with behavioral disturbance (HCC) 09/26/2024   Cough 09/26/2024   Troponin level elevated 09/26/2024   Anemia due to chronic kidney disease 09/26/2024   Lower extremity edema 09/26/2024   Sepsis due to olecranon bursitis 08/26/2024   Pressure injury of skin 08/26/2024   SIRS (systemic inflammatory response syndrome) (HCC) 08/23/2024   Vitamin B12 deficiency 02/18/2023   GERD (gastroesophageal reflux disease) 02/18/2023   Chronic pain of right knee 08/20/2022   Primary hyperparathyroidism 08/18/2022   Gout 02/13/2022   Prostate cancer 10/22/2021   Sinus tachycardia 09/26/2021   Dementia due to Alzheimer's disease 08/15/2021   HTN (hypertension) 05/28/2021   HLD (hyperlipidemia) 12/16/2018   Chronic kidney disease, stage 4 (severe) 12/16/2018   AKI (acute kidney injury) 02/05/2014    Orientation RESPIRATION BLADDER Height & Weight     Self  Normal Continent Weight: 85.3 kg Height:  6' (182.9 cm)  BEHAVIORAL SYMPTOMS/MOOD NEUROLOGICAL BOWEL NUTRITION STATUS      Continent Diet (Heart Healthy)  AMBULATORY STATUS COMMUNICATION OF NEEDS Skin   Extensive Assist Verbally  Normal                       Personal Care Assistance Level of Assistance  Bathing, Feeding, Dressing Bathing Assistance: Maximum assistance Feeding assistance: Limited assistance Dressing Assistance: Maximum assistance     Functional Limitations Info             SPECIAL CARE FACTORS FREQUENCY  PT (By licensed PT), OT (By licensed OT)     PT Frequency: 5 days per week OT Frequency: 5 days per week            Contractures Contractures Info: Not present    Additional Factors Info  Code Status, Allergies Code Status Info: DNR Allergies Info: NKA           Current Medications (09/30/2024):  This is the current hospital active medication list Current Facility-Administered Medications  Medication Dose Route Frequency Provider Last Rate Last Admin   0.9 %  sodium chloride  infusion   Intravenous Continuous Agbata, Tochukwu, MD       acetaminophen  (TYLENOL ) tablet 650 mg  650 mg Oral Q6H PRN Core, Prentice BROCKS, MD   650 mg at 09/29/24 1655   Or   acetaminophen  (TYLENOL ) suppository 650 mg  650 mg Rectal Q6H PRN Core, Prentice BROCKS, MD       albuterol  (PROVENTIL ) (2.5 MG/3ML) 0.083% nebulizer solution 2.5 mg  2.5 mg Nebulization Q2H PRN Core, Prentice BROCKS, MD   2.5 mg at 09/29/24 0340   allopurinol  (ZYLOPRIM ) tablet 100 mg  100 mg Oral Daily Core, Prentice BROCKS, MD   100 mg at 09/30/24 [phone]  amLODipine  (NORVASC ) tablet 10 mg  10 mg Oral Daily Agbata, Tochukwu, MD   10 mg at 09/30/24 0925   benzonatate  (TESSALON ) capsule 200 mg  200 mg Oral TID PRN Williams, Jamiese M, MD   200 mg at 09/29/24 0339   chlorpheniramine-HYDROcodone  (TUSSIONEX) 10-8 MG/5ML suspension 5 mL  5 mL Oral Q12H PRN Mansy, Jan A, MD   5 mL at 09/30/24 9075   cyanocobalamin  (VITAMIN B12) tablet 1,000 mcg  1,000 mcg Oral Daily Core, Prentice BROCKS, MD   1,000 mcg at 09/30/24 9074   diphenhydrAMINE  (BENADRYL ) capsule 50 mg  50 mg Oral QHS Trudy Anthony HERO, MD   50 mg at 09/29/24 2145   enoxaparin  (LOVENOX ) injection 40 mg   40 mg Subcutaneous Q24H Niels Kayla FALCON, RPH   40 mg at 09/29/24 2146   folic acid  (FOLVITE ) tablet 1 mg  1 mg Oral Daily Core, Prentice BROCKS, MD   1 mg at 09/30/24 9074   [name] 1 Application  1 Application Topical TID Core, Prentice BROCKS, MD   1 Application at 09/30/24 9073   guaiFENesin  (MUCINEX ) 12 hr tablet 600 mg  600 mg Oral BID Mansy, Jan A, MD   600 mg at 09/30/24 9075   haloperidol  lactate (HALDOL ) injection 5 mg  5 mg Intramuscular Q6H PRN Williams, Jamiese M, MD   5 mg at 09/30/24 9262   ondansetron  (ZOFRAN ) tablet 4 mg  4 mg Oral Q6H PRN Core, Prentice BROCKS, MD       Or   ondansetron  (ZOFRAN ) injection 4 mg  4 mg Intravenous Q6H PRN Core, Prentice BROCKS, MD       pravastatin  (PRAVACHOL ) tablet 40 mg  40 mg Oral Daily Core, Prentice BROCKS, MD   40 mg at 09/29/24 2144   senna-docusate (Senokot-S) tablet 1 tablet  1 tablet Oral QHS PRN Core, Prentice BROCKS, MD         Discharge Medications: Please see discharge summary for a list of discharge medications.  Relevant Imaging Results:  Relevant Lab Results:   Additional Information SSN: [id number]  Nathanael HERO Ring, RN     "

## 2024-09-30 NOTE — Care Management Important Message (Signed)
 Important Message  Patient Details  Name: Jason Estrada MRN: [id number] Date of Birth: 1941-08-03   Important Message Given:  Yes - Medicare IM     [name], CMA 09/30/2024, 11:46 AM

## 2024-09-30 NOTE — TOC Initial Note (Signed)
 Transition of Care [facility]) - Initial/Assessment Note    Patient Details  Name: Jason Estrada MRN: [id number] Date of Birth: 1941-09-15  Transition of Care [facility]) CM/SW Contact:    Jason CHRISTELLA Ring, RN Phone Number: 09/30/2024, 12:49 PM  Clinical Narrative:                 CM met with patient's wife and patient at the bedside.  Patient only oriented to person.  Wife is trying to decide if she would like to take the patient home with home health or go back to [facility] for rehab.  She said that they had just discharged from [facility] and had only been home for 24 hours before having to come back to the hospital.  She was in the process of getting homecare arranged with [facility].  SNF bed search sent out and referral to home health agencies.    Expected Discharge Plan: Skilled Nursing Facility Barriers to Discharge: SNF Pending bed offer   Patient Goals and CMS Choice Patient states their goals for this hospitalization and ongoing recovery are:: Wife deciding between home with home health and SNF CMS Medicare.gov Compare Post Acute Care list provided to:: Patient Represenative (must comment) Choice offered to / list presented to : Spouse      Expected Discharge Plan and Services   Discharge Planning Services: CM Consult Post Acute Care Choice: Skilled Nursing Facility, Home Health Living arrangements for the past 2 months: Single Family Home                 DME Arranged: N/A                    Prior Living Arrangements/Services Living arrangements for the past 2 months: Single Family Home Lives with:: Spouse Patient language and need for interpreter reviewed:: Yes Do you feel safe going back to the place where you live?: Yes      Need for Family Participation in Patient Care: Yes (Comment) Care giver support system in place?: Yes (comment) Current home services: DME (BSC, walker, rollator, cane) Criminal Activity/Legal Involvement Pertinent to Current  Situation/Hospitalization: No - Comment as needed  Activities of Daily Living   ADL Screening (condition at time of admission) Independently performs ADLs?: No Does the patient have a NEW difficulty with bathing/dressing/toileting/self-feeding that is expected to last >3 days?: No Does the patient have a NEW difficulty with getting in/out of bed, walking, or climbing stairs that is expected to last >3 days?: No Does the patient have a NEW difficulty with communication that is expected to last >3 days?: No Is the patient deaf or have difficulty hearing?: No Does the patient have difficulty seeing, even when wearing glasses/contacts?: No Does the patient have difficulty concentrating, remembering, or making decisions?: No  Permission Sought/Granted Permission sought to share information with : Facility Engineer, Maintenance (it) granted to share info w AGENCY: SNF's        Emotional Assessment Appearance:: Appears stated age Attitude/Demeanor/Rapport: Engaged Affect (typically observed): Accepting Orientation: : Oriented to Self Alcohol / Substance Use: Not Applicable Psych Involvement: No (comment)  Admission diagnosis:  Toxic encephalopathy [G92.9] Bradycardia [R00.1] AKI (acute kidney injury) [N17.9] Altered mental status, unspecified altered mental status type [R41.82] Patient Active Problem List   Diagnosis Date Noted   Toxic encephalopathy 09/26/2024   Bradycardia 09/26/2024   Dementia with behavioral disturbance (HCC) 09/26/2024   Cough 09/26/2024   Troponin level elevated 09/26/2024  Anemia due to chronic kidney disease 09/26/2024   Lower extremity edema 09/26/2024   Sepsis due to olecranon bursitis 08/26/2024   Pressure injury of skin 08/26/2024   SIRS (systemic inflammatory response syndrome) (HCC) 08/23/2024   Vitamin B12 deficiency 02/18/2023   GERD (gastroesophageal reflux disease) 02/18/2023   Chronic pain of right knee 08/20/2022   Primary  hyperparathyroidism 08/18/2022   Gout 02/13/2022   Prostate cancer 10/22/2021   Sinus tachycardia 09/26/2021   Dementia due to Alzheimer's disease 08/15/2021   HTN (hypertension) 05/28/2021   HLD (hyperlipidemia) 12/16/2018   Chronic kidney disease, stage 4 (severe) 12/16/2018   AKI (acute kidney injury) 02/05/2014   PCP:  Jason Melanie DASEN, NP Pharmacy:   [phone] [place] [place] - [address] [place] [address] Phone: [phone] Fax: [phone]     Social Drivers of Health (SDOH) Social History: SDOH Screenings   Food Insecurity: No Food Insecurity (09/17/2024)   Received from [facility]  Housing: Low Risk  (09/17/2024)   Received from [facility]  Transportation Needs: No Transportation Needs (09/17/2024)   Received from [facility]  Utilities: Not At Risk (09/17/2024)   Received from [facility]  Depression [phone]): Low Risk (06/22/2024)  Financial Resource Strain: Low Risk  (09/17/2024)   Received from [facility]  Social Connections: Unknown (08/23/2024)  Tobacco Use: Low Risk (09/26/2024)   SDOH Interventions:     Readmission Risk Interventions     No data to display

## 2024-09-30 NOTE — Progress Notes (Signed)
 Mobility Specialist - Progress Note    09/30/24 1700  Mobility  Activity Ambulated with assistance;Stood with assistance;Dangled on edge of bed  Level of Assistance Standby assist, set-up cues, supervision of patient - no hands on  Assistive Device None  Distance Ambulated (ft) 20 ft  Range of Motion/Exercises Active  Activity Response Tolerated fair  Mobility visit 1 Mobility  Mobility Specialist Start Time (ACUTE ONLY) 1523  Mobility Specialist Stop Time (ACUTE ONLY) 1537  Mobility Specialist Time Calculation (min) (ACUTE ONLY) 14 min   Pt was supine in bed with the HOB elevated on RA with guest in the room upon entry. Pt agreed to mobility. Pt is able today to get to the EOB independently with bed features. Pt is able to STS independently but has a sway when upright. Pt is better with a 2 WW when ambulating in the room. After activity pt returned to the bed with needs in reach and bed alarm on.  Clem Rodes Mobility Specialist 09/30/24, 5:34 PM

## 2024-09-30 NOTE — Progress Notes (Addendum)
 "                                                                                                                                                                                                          Daily Progress Note   Patient Name: Jason Estrada       Date: 09/30/2024 DOB: November 03, 1940  Age: 84 y.o. MRN#: [id number] Attending Physician: Lanetta Lingo, MD Primary Care Physician: [name], NP Admit Date: 09/26/2024  Reason for Consultation/Follow-up: Establishing goals of care  Subjective: Notes and labs reviewed.  In to see patient.  He is currently sitting in bedside chair at this time.  His wife is present along with TeleSitter.  TOC is present discussing discharge.  Patient's wife states she would like rehab for her husband.  She confirms DNR status.  We reviewed palliative and hospice focused care.  Questions answered between [facility] and I, as able, regarding outpatient palliative and hospice services.  She is aware that specific companies can answer questions that we are unable to.  We discussed outpatient palliative to follow with transition to hospice care when she is ready.  TOC and stepped out of the room together, and she discusses working to find rehab placement.  Length of Stay: 4  Current Medications: Scheduled Meds:   allopurinol   100 mg Oral Daily   amLODipine   10 mg Oral Daily   cyanocobalamin   1,000 mcg Oral Daily   diphenhydrAMINE   50 mg Oral QHS   enoxaparin  (LOVENOX ) injection  40 mg Subcutaneous Q24H   folic acid   1 mg Oral Daily   [name]  1 Application Topical TID   guaiFENesin   600 mg Oral BID   pravastatin   40 mg Oral Daily    Continuous Infusions:   PRN Meds: acetaminophen  **OR** acetaminophen , albuterol , benzonatate , chlorpheniramine-HYDROcodone , haloperidol  lactate, ondansetron  **OR** ondansetron  (ZOFRAN ) IV, senna-docusate  Physical Exam Pulmonary:     Effort: Pulmonary effort is normal.  Neurological:     Mental Status: He is  alert.             Vital Signs: BP (!) 141/59 (BP Location: Left Arm)   Pulse 65   Temp 97.6 F (36.4 C)   Resp 16   Ht 6' (1.829 m)   Wt 85.3 kg   SpO2 97%   BMI 25.50 kg/m  SpO2: SpO2: 97 % O2 Device: O2 Device: Room Air O2 Flow Rate: O2 Flow Rate (L/min): 1 L/min  Intake/output summary:  Intake/Output Summary (Last 24 hours) at 09/30/2024 1140 Last data filed at  09/30/2024 0900 Gross per 24 hour  Intake 480 ml  Output 400 ml  Net 80 ml   LBM: Last BM Date : 09/29/24 Baseline Weight: Weight: 85.3 kg Most recent weight: Weight: 85.3 kg   Patient Active Problem List   Diagnosis Date Noted   Toxic encephalopathy 09/26/2024   Bradycardia 09/26/2024   Dementia with behavioral disturbance (HCC) 09/26/2024   Cough 09/26/2024   Troponin level elevated 09/26/2024   Anemia due to chronic kidney disease 09/26/2024   Lower extremity edema 09/26/2024   Sepsis due to olecranon bursitis 08/26/2024   Pressure injury of skin 08/26/2024   SIRS (systemic inflammatory response syndrome) (HCC) 08/23/2024   Vitamin B12 deficiency 02/18/2023   GERD (gastroesophageal reflux disease) 02/18/2023   Chronic pain of right knee 08/20/2022   Primary hyperparathyroidism 08/18/2022   Gout 02/13/2022   Prostate cancer 10/22/2021   Sinus tachycardia 09/26/2021   Dementia due to Alzheimer's disease 08/15/2021   HTN (hypertension) 05/28/2021   HLD (hyperlipidemia) 12/16/2018   Chronic kidney disease, stage 4 (severe) 12/16/2018   AKI (acute kidney injury) 02/05/2014    Palliative Care Assessment & Plan   Recommendations/Plan: Wife would like outpatient palliative with transition to hospice when they are ready.  Continue DNR status.  PMT will sign off at this time.  Please reconsult if goals arise.   Code Status:    Code Status Orders  (From admission, onward)           Start     Ordered   09/26/24 1455  Do not attempt resuscitation (DNR)- Limited -Do Not Intubate (DNI)   Continuous       Question Answer Comment  If pulseless and not breathing No CPR or chest compressions.   In Pre-Arrest Conditions (Patient Is Breathing and Has A Pulse) Do not intubate. Provide all appropriate non-invasive medical interventions. Avoid ICU transfer unless indicated or required.   Consent: Discussion documented in EHR or advanced directives reviewed      09/26/24 1455           Code Status History     Date Active Date Inactive Code Status Order ID Comments User Context   08/23/2024 0045 08/26/2024 1850 Full Code [id number]  [name], MD ED   04/22/2021 1335 04/23/2021 2215 Full Code [id number]  Jeryl Mardene RAMAN, DO Inpatient      Advance Directive Documentation    Flowsheet Row Most Recent Value  Type of Advance Directive Out of facility DNR (pink MOST or yellow form)  Pre-existing out of facility DNR order (yellow form or pink MOST form) Yellow form placed in chart (order not valid for inpatient use)  MOST Form in Place? --    Care plan was discussed with [facility]  Thank you for allowing the Palliative Medicine Team to assist in the care of this patient.  Camelia Lewis, NP  Please contact Palliative Medicine Team phone at [phone] for questions and concerns.       "

## 2024-09-30 NOTE — Plan of Care (Signed)

## 2024-09-30 NOTE — Progress Notes (Signed)
 " Progress Note   Patient: Jason Estrada FMW:[id number] DOB: 08/04/1941 DOA: 09/26/2024     4 DOS: the patient was seen and examined on 09/30/2024   Brief hospital course:  Mr. Jason Estrada is a 84 year old gentleman with a past medical history of dementia, prostate cancer gastroesophageal reflux disease, hypertension, chronic kidney disease stage IIIb and recent hospitalization for sepsis secondary to olecranon bursitis discharged 08/26/2024 who presented to the emergency department with mental status change presenting as inability to be aroused and somnolence.  The patient's wife Jason Estrada at the bedside provides most of the history.  She reports the patient was recently discharged from inpatient rehab after a 30-day short-term rehabilitation stay.  The patient was noted to have over the last 4 to 5 days a cough but continue to eat and drink normally as per the spouse.  She had been bringing in protein drinks and on the day of discharge from the rehab yesterday the patient had an episode of sundowning and confusion requiring extra time to arouse him.  Of note the patient had issues with inability to function with rehabilitation well at the facility due to the fact he was given Seroquel  and was overly sedated.  When the patient's home health care nurse was unable to arouse him this morning the activated EMS.  Of note the patient was reportedly bradycardic in the 40s with systolic pressure in the 100s when EMS arrived with pinpoint pupils as well as desaturation and reportedly responded well to Narcan although the patient's wife reports no opiates in the house and the patient is dependent on her for medications.  The patient had also been receiving aggressive diuresis reportedly with Lasix  for dependent edema which patient's wife reports is significantly improved now.  Patient is confused at baseline although the patient's wife believes he is now at his mental baseline.  Patient's wife cannot recall any past  echocardiograms or diagnosis of heart failure   Case discussed with emergency department provider and patient subsequently placed in admission.        Assessment and Plan:  Toxic encephalopathy:  History of Dementia Etiology unclear and thought to be medication induced.  Patient was on baclofen  which is currently on hold Patient presented to the ER for evaluation of unresponsiveness and increased somnolence Patient is currently awake, alert and oriented to person, not to place or time which appears to be his baseline       Rhinovirus/enterovirus infection:  Continue supportive care Continue antitussives Will discontinue IV steroids Continue bronchodilator therapy and incentive spirometry       Bradycardia:  Probably medication induced Continue to hold verapamil  and donepezil      Gout:  Continue allopurinol       AKI on CKDIV:  Baseline serum creatinine of 1.7. Creatinine up trended to 2.6 and shows a downward trend Etiology may be prerenal and may be related to diuretic use Continue IV fluid hydration for the next 24 hours     Lower extremity edema:  US  of b/l LE was neg.  CTA chest ordered b/c d-dimer was elevated.  No evidence of PE Echo shows EF 60-65%, grade I diastolic dysfunction, no regional wall motion abnormalities      Anemia of chronic kidney disease Likely secondary to CKD. No need for a transfusion currently     Ascending thoracic aortic aneurysm:  4 cm and incidental finding on CTA chest.  Will need repeat imagining in 1 years w/ CTA or MRA.      Elevated troponin:  trending down.  Likely secondary to demand ischemia        Dementia:  With behavioral disturbance.  Donepezil  is on hold secondary to bradycardia.  Haldol  prn for agitation.  Palliative care consulted for goals of care     GERD:  Continue on PPI      Prostate cancer: Follow-up with oncology as an outpatient      HTN: Improved blood pressure control on  amlodipine  Verapamil  is on hold due to bradycardia            Subjective: Has a nonproductive cough  Physical Exam: Vitals:   09/29/24 1955 09/30/24 0006 09/30/24 0352 09/30/24 0836  BP: 132/61 (!) 152/59 137/72 (!) 141/59  Pulse: 83 81 76 65  Resp: 18 18 18 16   Temp: 98.4 F (36.9 C) 97.7 F (36.5 C) 97.6 F (36.4 C) 97.6 F (36.4 C)  TempSrc: Oral     SpO2: 97% 93% 94% 97%  Weight:      Height:       General exam: Appears comfortable and in no distress  Respiratory system: Bilateral air entry Cardiovascular system: S1/S2+. No rubs or clicks   Gastrointestinal system: Abd is soft, NT, ND & hypoactive bowel sounds  Central nervous system: Awake and alert.  Oriented only to person Psychiatry: Judgement and insight appears poor. Flat mood and affect        Data Reviewed: Labs reviewed.  BUN 42, creatinine 1.9, hemoglobin 10.7 Labs reviewed  Family Communication: Left a voicemail for patient's wife, awaiting callback  Disposition: Status is: Inpatient Remains inpatient appropriate because: Discharge planning  Planned Discharge Destination: Skilled nursing facility    Time spent: 40 minutes  Author: Aimee Somerset, MD 09/30/2024 12:08 PM  For on call review [email or web address].  "

## 2024-09-30 NOTE — TOC Progression Note (Addendum)
 Transition of Care [facility]) - Progression Note    Patient Details  Name: Jason Estrada MRN: [id number] Date of Birth: [birth year]  [facility]) CM/SW Contact  Nathanael CHRISTELLA Ring, RN Phone Number: 09/30/2024, 3:36 PM  Clinical Narrative:    Met with wife again this afternoon, she has decided that she would like for the patient to for STR at [facility] and follow with Palliative care OP.  Waiting to hear back from [facility] to see if they can accept him.  Gave her choice for Palliative between [facility] and [facility].  She wanted to go with [facility] as they are who she was referred to for [facility] but they do not offer OP Palliative only hospice.     Expected Discharge Plan: Skilled Nursing Facility Barriers to Discharge: SNF Pending bed offer               Expected Discharge Plan and Services   Discharge Planning Services: CM Consult Post Acute Care Choice: Skilled Nursing Facility, Home Health Living arrangements for the past 2 months: Single Family Home                 DME Arranged: N/A                     Social Drivers of Health (SDOH) Interventions SDOH Screenings   Food Insecurity: No Food Insecurity (09/17/2024)   Received from [facility]  Housing: Low Risk  (09/17/2024)   Received from [facility]  Transportation Needs: No Transportation Needs (09/17/2024)   Received from [facility]  Utilities: Not At Risk (09/17/2024)   Received from [facility]  Depression [phone]): Low Risk (06/22/2024)  Financial Resource Strain: Low Risk  (09/17/2024)   Received from [facility]  Social Connections: Unknown (08/23/2024)  Tobacco Use: Low Risk (09/26/2024)    Readmission Risk Interventions     No data to display

## 2024-10-01 DIAGNOSIS — G929 Unspecified toxic encephalopathy: Secondary | ICD-10-CM | POA: Diagnosis not present

## 2024-10-01 LAB — BASIC METABOLIC PANEL WITH GFR
Anion gap: 8 (ref 5–15)
BUN: 39 mg/dL — ABNORMAL HIGH (ref 8–23)
CO2: 24 mmol/L (ref 22–32)
Calcium: 8.9 mg/dL (ref 8.9–10.3)
Chloride: 109 mmol/L (ref 98–111)
Creatinine, Ser: 1.83 mg/dL — ABNORMAL HIGH (ref 0.61–1.24)
GFR, Estimated: 37 mL/min — ABNORMAL LOW
Glucose, Bld: 98 mg/dL (ref 70–99)
Potassium: 4 mmol/L (ref 3.5–5.1)
Sodium: 141 mmol/L (ref 135–145)

## 2024-10-01 NOTE — Progress Notes (Signed)
 Occupational Therapy Treatment Patient Details Name: Jason Estrada MRN: [id number] DOB: 1941-03-01 Today's Date: 10/01/2024   History of present illness presented to ER secondary to AMS, decreased alertness; admitted for management of toxic metabolic encephalopathy (? related to baclofen ?).  Of note, was recently hospitalized (12/25) due to olecranon bursitis and transitioned to STR from hospital; home x1-2 days prior to this admission.   OT comments  Patient seen for OT treatment on this date. Upon arrival to room patient resting in bed with spouse present, agreeable to treatment. Patient transitioned to EOB with mod I sit<>stand with CGA to brush teeth at sink wit hCGA, able to don underpants while seated with increased time/effort. Patient ambulated 50 feet in room, O2 remained >94%, used r/w and required CGA/SBA. Patient ended tx in recliner with spouse present. Patient making good progress toward goals, will continue to follow POC. Discharge recommendation remains appropriate.        If plan is discharge home, recommend the following:  A little help with walking and/or transfers;A little help with bathing/dressing/bathroom;Assistance with cooking/housework;Direct supervision/assist for medications management;Direct supervision/assist for financial management;Assist for transportation;Help with stairs or ramp for entrance;Supervision due to cognitive status   Equipment Recommendations  None recommended by OT    Recommendations for Other Services      Precautions / Restrictions Precautions Precautions: Fall Recall of Precautions/Restrictions: Impaired Restrictions Weight Bearing Restrictions Per Provider Order: No       Mobility Bed Mobility Overal bed mobility: Needs Assistance Bed Mobility: Supine to Sit, Sit to Supine     Supine to sit: Supervision Sit to supine: Modified independent (Device/Increase time)   General bed mobility comments: extra time, cues, supervision for  safety.    Transfers Overall transfer level: Needs assistance Equipment used: Rolling walker (2 wheels) Transfers: Sit to/from Stand Sit to Stand: Contact guard assist           General transfer comment: sit<>stand x 3 trials with CGA, cues for h and placement     Balance Overall balance assessment: Needs assistance Sitting-balance support: No upper extremity supported, Feet supported Sitting balance-Leahy Scale: Good     Standing balance support: Bilateral upper extremity supported Standing balance-Leahy Scale: Fair                             ADL either performed or assessed with clinical judgement   ADL Overall ADL's : Needs assistance/impaired                                       General ADL Comments: patient able to thread BLE into underpoants and pull up with CGA; able to stand at sink to brush teeth with CGA    Extremity/Trunk Assessment Upper Extremity Assessment Upper Extremity Assessment: Generalized weakness   Lower Extremity Assessment Lower Extremity Assessment: Defer to PT evaluation        Vision       Perception     Praxis     Communication Communication Communication: No apparent difficulties   Cognition Arousal: Alert Behavior During Therapy: Impulsive Cognition: History of cognitive impairments             OT - Cognition Comments: dx of dementia                 Following commands: Impaired Following commands impaired: Follows one step commands inconsistently  Cueing   Cueing Techniques: Verbal cues, Gestural cues, Tactile cues  Exercises      Shoulder Instructions       General Comments      Pertinent Vitals/ Pain       Pain Assessment Pain Assessment: No/denies pain  Home Living                                          Prior Functioning/Environment              Frequency  Min 2X/week        Progress Toward Goals  OT Goals(current goals can  now be found in the care plan section)  Progress towards OT goals: Progressing toward goals  Acute Rehab OT Goals Patient Stated Goal: none OT Goal Formulation: With patient Time For Goal Achievement: 10/13/24 Potential to Achieve Goals: Good ADL Goals Pt Will Perform Grooming: with modified independence;standing Pt Will Perform Lower Body Dressing: with modified independence;sit to/from stand Pt Will Transfer to Toilet: with modified independence;ambulating;bedside commode  Plan      Co-evaluation                 AM-PAC OT 6 Clicks Daily Activity     Outcome Measure   Help from another person eating meals?: None Help from another person taking care of personal grooming?: A Little Help from another person toileting, which includes using toliet, bedpan, or urinal?: A Little Help from another person bathing (including washing, rinsing, drying)?: A Little Help from another person to put on and taking off regular upper body clothing?: A Little Help from another person to put on and taking off regular lower body clothing?: A Little 6 Click Score: 19    End of Session Equipment Utilized During Treatment: Rolling walker (2 wheels)  OT Visit Diagnosis: Muscle weakness (generalized) (M62.81)   Activity Tolerance Patient tolerated treatment well   Patient Left with call bell/phone within reach;in chair;with chair alarm set   Nurse Communication Mobility status        Time: 8793-8775 OT Time Calculation (min): 18 min  Charges: OT General Charges $OT Visit: 1 Visit OT Treatments $Self Care/Home Management : 8-22 mins  Rogers Clause, OT/L MSOT, 10/01/2024

## 2024-10-01 NOTE — Plan of Care (Signed)

## 2024-10-01 NOTE — Progress Notes (Signed)
 " Progress Note   Patient: Jason Estrada FMW:[id number] DOB: Jan 20, 1941 DOA: 09/26/2024     5 DOS: the patient was seen and examined on 10/01/2024   Brief hospital course:  Mr. Carriger is a 84 year old gentleman with a past medical history of dementia, prostate cancer gastroesophageal reflux disease, hypertension, chronic kidney disease stage IIIb and recent hospitalization for sepsis secondary to olecranon bursitis discharged 08/26/2024 who presented to the emergency department with mental status change presenting as inability to be aroused and somnolence.  The patient's wife [name] at the bedside provides most of the history.  She reports the patient was recently discharged from inpatient rehab after a 30-day short-term rehabilitation stay.  The patient was noted to have over the last 4 to 5 days a cough but continue to eat and drink normally as per the spouse.  She had been bringing in protein drinks and on the day of discharge from the rehab yesterday the patient had an episode of sundowning and confusion requiring extra time to arouse him.  Of note the patient had issues with inability to function with rehabilitation well at the facility due to the fact he was given Seroquel  and was overly sedated.  When the patient's home health care nurse was unable to arouse him this morning the activated EMS.  Of note the patient was reportedly bradycardic in the 40s with systolic pressure in the 100s when EMS arrived with pinpoint pupils as well as desaturation and reportedly responded well to Narcan although the patient's wife reports no opiates in the house and the patient is dependent on her for medications.  The patient had also been receiving aggressive diuresis reportedly with Lasix  for dependent edema which patient's wife reports is significantly improved now.  Patient is confused at baseline although the patient's wife believes he is now at his mental baseline.  Patient's wife cannot recall any past  echocardiograms or diagnosis of heart failure   Case discussed with emergency department provider and patient subsequently placed in admission.        Assessment and Plan:  Toxic encephalopathy:  History of Dementia Etiology unclear and thought to be medication induced.  Patient was on baclofen  which is currently on hold Patient presented to the ER for evaluation of unresponsiveness and increased somnolence Patient is currently awake, alert and oriented to person, not to place or time which appears to be his baseline       Rhinovirus/enterovirus infection:  Stable Continue supportive care Continue antitussives Off steroids Continue bronchodilator therapy and incentive spirometry       Bradycardia:  Probably medication induced Continue to hold verapamil  and donepezil      Gout:  Continue allopurinol       AKI on CKDIV:  Baseline serum creatinine of 1.7. Creatinine up trended to 2.6 and shows a downward trend Etiology may be prerenal and may be related to diuretic use Renal function is back to baseline     Lower extremity edema:  US  of b/l LE was neg.  CTA chest ordered b/c d-dimer was elevated.  No evidence of PE Echo shows EF 60-65%, grade I diastolic dysfunction, no regional wall motion abnormalities      Anemia of chronic kidney disease Likely secondary to CKD. No need for a transfusion currently     Ascending thoracic aortic aneurysm:  4 cm and incidental finding on CTA chest.  Will need repeat imagining in 1 years w/ CTA or MRA.      Elevated troponin:  trending down.  Likely secondary to demand ischemia        Dementia:  With behavioral disturbance.  Donepezil  is on hold secondary to bradycardia.  Haldol  prn for agitation.  Palliative care consulted for goals of care     GERD:  Continue on PPI      Prostate cancer: Follow-up with oncology as an outpatient      HTN: Improved blood pressure control on amlodipine  Verapamil  is on hold due  to bradycardia              Subjective: No new complaints.  Sitting up in the chair  Physical Exam: Vitals:   09/30/24 0836 09/30/24 2033 10/01/24 0318 10/01/24 0747  BP: (!) 141/59 (!) 159/85 (!) 146/67 135/62  Pulse: 65 92 64 68  Resp: 16   17  Temp: 97.6 F (36.4 C) 97.8 F (36.6 C) 97.6 F (36.4 C) 97.7 F (36.5 C)  TempSrc:  Oral Oral   SpO2: 97% 95% 95% 95%  Weight:      Height:       General exam: Appears comfortable and in no distress  Respiratory system: Bilateral air entry Cardiovascular system: S1/S2+. No rubs or clicks   Gastrointestinal system: Abd is soft, NT, ND & hypoactive bowel sounds  Central nervous system: Awake and alert.  Oriented only to person Psychiatry: Judgement and insight appears poor. Flat mood and affect    Data Reviewed: Labs reviewed.  BUN 39, creatinine 1.83 Labs reviewed  Family Communication: Plan of care discussed with patient's wife over the phone.  She verbalizes understanding and agrees to the plan  Disposition: Status is: Inpatient Remains inpatient appropriate because: Awaiting discharge to SNF  Planned Discharge Destination: Skilled nursing facility    Time spent: 35 minutes  Author: Aimee Somerset, MD 10/01/2024 12:37 PM  For on call review [email or web address].  "

## 2024-10-01 NOTE — Plan of Care (Signed)
" °  Problem: Health Behavior/Discharge Planning: Goal: Ability to manage health-related needs will improve Outcome: Progressing   Problem: Education: Goal: Knowledge of General Education information will improve Description: Including pain rating scale, medication(s)/side effects and non-pharmacologic comfort measures Outcome: Progressing   Problem: Clinical Measurements: Goal: Ability to maintain clinical measurements within normal limits will improve Outcome: Progressing Goal: Will remain free from infection Outcome: Progressing Goal: Diagnostic test results will improve Outcome: Progressing Goal: Respiratory complications will improve Outcome: Progressing Goal: Cardiovascular complication will be avoided Outcome: Progressing   Problem: Health Behavior/Discharge Planning: Goal: Ability to manage health-related needs will improve Outcome: Progressing   "

## 2024-10-01 NOTE — TOC Progression Note (Signed)
 Transition of Care [facility]) - Progression Note    Patient Details  Name: Jason Estrada MRN: [id number] Date of Birth: 1941-01-15  Transition of Care [facility]) CM/SW Contact  Nathanael CHRISTELLA Ring, RN Phone Number: 10/01/2024, 4:15 PM  Clinical Narrative:    All local skilled nursing facilities have declined to accept.  Wife agreed to reach back out to [facility], they initially offered but then pulled the bed offer as when patient was there before the wife blamed the facility for everything that went wrong with him and expressed not being happy with their care.  Extended bed search.     Expected Discharge Plan: Skilled Nursing Facility Barriers to Discharge: SNF Pending bed offer               Expected Discharge Plan and Services   Discharge Planning Services: CM Consult Post Acute Care Choice: Skilled Nursing Facility, Home Health Living arrangements for the past 2 months: Single Family Home                 DME Arranged: N/A                     Social Drivers of Health (SDOH) Interventions SDOH Screenings   Food Insecurity: No Food Insecurity (09/17/2024)   Received from [facility]  Housing: Low Risk  (09/17/2024)   Received from [facility]  Transportation Needs: No Transportation Needs (09/17/2024)   Received from [facility]  Utilities: Not At Risk (09/17/2024)   Received from [facility]  Depression [phone]): Low Risk (06/22/2024)  Financial Resource Strain: Low Risk  (09/17/2024)   Received from [facility]  Social Connections: Unknown (08/23/2024)  Tobacco Use: Low Risk (09/26/2024)    Readmission Risk Interventions     No data to display

## 2024-10-01 NOTE — Progress Notes (Signed)
Per Dr Joylene Igo, dc tele monitoring

## 2024-10-02 ENCOUNTER — Other Ambulatory Visit: Payer: Self-pay

## 2024-10-02 ENCOUNTER — Inpatient Hospital Stay

## 2024-10-02 DIAGNOSIS — D62 Acute posthemorrhagic anemia: Secondary | ICD-10-CM | POA: Diagnosis not present

## 2024-10-02 LAB — GLUCOSE, CAPILLARY: Glucose-Capillary: 103 mg/dL — ABNORMAL HIGH (ref 70–99)

## 2024-10-02 LAB — BASIC METABOLIC PANEL WITH GFR
Anion gap: 19 — ABNORMAL HIGH (ref 5–15)
BUN: 41 mg/dL — ABNORMAL HIGH (ref 8–23)
CO2: 12 mmol/L — ABNORMAL LOW (ref 22–32)
Calcium: 7.1 mg/dL — ABNORMAL LOW (ref 8.9–10.3)
Chloride: 115 mmol/L — ABNORMAL HIGH (ref 98–111)
Creatinine, Ser: 1.82 mg/dL — ABNORMAL HIGH (ref 0.61–1.24)
GFR, Estimated: 37 mL/min — ABNORMAL LOW
Glucose, Bld: 136 mg/dL — ABNORMAL HIGH (ref 70–99)
Potassium: 4 mmol/L (ref 3.5–5.1)
Sodium: 146 mmol/L — ABNORMAL HIGH (ref 135–145)

## 2024-10-02 LAB — CBC
HCT: 25.4 % — ABNORMAL LOW (ref 39.0–52.0)
Hemoglobin: 7.5 g/dL — ABNORMAL LOW (ref 13.0–17.0)
MCH: 29.9 pg (ref 26.0–34.0)
MCHC: 29.5 g/dL — ABNORMAL LOW (ref 30.0–36.0)
MCV: 101.2 fL — ABNORMAL HIGH (ref 80.0–100.0)
Platelets: 286 K/uL (ref 150–400)
RBC: 2.51 MIL/uL — ABNORMAL LOW (ref 4.22–5.81)
RDW: 13.7 % (ref 11.5–15.5)
WBC: 7.4 K/uL (ref 4.0–10.5)
nRBC: 0 % (ref 0.0–0.2)

## 2024-10-02 LAB — PREPARE RBC (CROSSMATCH)

## 2024-10-02 LAB — ABO/RH: ABO/RH(D): A POS

## 2024-10-02 MED ORDER — IOHEXOL 350 MG/ML SOLN
100.0000 mL | Freq: Once | INTRAVENOUS | Status: AC | PRN
  Administered 2024-10-02: 100 mL via INTRAVENOUS

## 2024-10-02 MED ORDER — LORAZEPAM 2 MG/ML IJ SOLN
1.0000 mg | INTRAMUSCULAR | Status: DC | PRN
  Administered 2024-10-02: 1 mg via INTRAVENOUS
  Filled 2024-10-02 (×2): qty 1

## 2024-10-02 MED ORDER — ACETAMINOPHEN 650 MG RE SUPP: 650.0000 mg | Freq: Four times a day (QID) | RECTAL | Status: DC | PRN

## 2024-10-02 MED ORDER — HALOPERIDOL LACTATE 2 MG/ML PO CONC: 2.0000 mg | Freq: Four times a day (QID) | ORAL | Status: DC | PRN

## 2024-10-02 MED ORDER — SODIUM CHLORIDE 0.9% IV SOLUTION: Freq: Once | INTRAVENOUS | Status: AC

## 2024-10-02 MED ORDER — GLYCOPYRROLATE 0.2 MG/ML IJ SOLN: 0.2000 mg | INTRAMUSCULAR | Status: DC | PRN

## 2024-10-02 MED ORDER — HALOPERIDOL LACTATE 5 MG/ML IJ SOLN: 2.0000 mg | Freq: Four times a day (QID) | INTRAMUSCULAR | Status: DC | PRN

## 2024-10-02 MED ORDER — ALBUTEROL SULFATE (2.5 MG/3ML) 0.083% IN NEBU: 2.5000 mg | INHALATION_SOLUTION | Freq: Four times a day (QID) | RESPIRATORY_TRACT | 12 refills | Status: DC | PRN

## 2024-10-02 MED ORDER — SODIUM CHLORIDE 0.9 % IV BOLUS: 500.0000 mL | Freq: Once | INTRAVENOUS | Status: DC

## 2024-10-02 MED ORDER — HYDROMORPHONE HCL 1 MG/ML IJ SOLN: 0.5000 mg | INTRAMUSCULAR | Status: DC | PRN

## 2024-10-02 MED ORDER — SODIUM CHLORIDE 0.9 % IV BOLUS: 1000.0000 mL | Freq: Once | INTRAVENOUS | Status: DC

## 2024-10-02 MED ORDER — GLYCOPYRROLATE 1 MG PO TABS: 1.0000 mg | ORAL_TABLET | ORAL | Status: DC | PRN

## 2024-10-02 MED ORDER — ONDANSETRON HCL 4 MG/2ML IJ SOLN: 4.0000 mg | Freq: Four times a day (QID) | INTRAMUSCULAR | Status: DC | PRN

## 2024-10-02 MED ORDER — POLYVINYL ALCOHOL 1.4 % OP SOLN: 1.0000 [drp] | Freq: Four times a day (QID) | OPHTHALMIC | Status: DC | PRN

## 2024-10-02 MED ORDER — ONDANSETRON 4 MG PO TBDP: 4.0000 mg | ORAL_TABLET | Freq: Four times a day (QID) | ORAL | Status: DC | PRN

## 2024-10-02 MED ORDER — HALOPERIDOL 0.5 MG PO TABS: 2.0000 mg | ORAL_TABLET | Freq: Four times a day (QID) | ORAL | Status: DC | PRN

## 2024-10-02 MED ORDER — DIPHENHYDRAMINE HCL 50 MG/ML IJ SOLN: 25.0000 mg | INTRAMUSCULAR | Status: DC | PRN

## 2024-10-02 MED ORDER — ACETAMINOPHEN 325 MG PO TABS: 650.0000 mg | ORAL_TABLET | Freq: Four times a day (QID) | ORAL | Status: DC | PRN

## 2024-10-02 MED ORDER — BIOTENE DRY MOUTH MT LIQD
15.0000 mL | Freq: Two times a day (BID) | OROMUCOSAL | Status: DC
  Administered 2024-10-02: 15 mL via TOPICAL

## 2024-10-03 LAB — BPAM RBC
Blood Product Expiration Date: 202602182359
ISSUE DATE / TIME: 202601170319
Unit Type and Rh: 6200

## 2024-10-03 LAB — TYPE AND SCREEN
ABO/RH(D): A POS
Antibody Screen: NEGATIVE
Unit division: 0

## 2024-10-05 ENCOUNTER — Telehealth: Payer: Self-pay

## 2024-10-06 ENCOUNTER — Other Ambulatory Visit: Payer: Self-pay | Admitting: Nurse Practitioner

## 2024-10-07 NOTE — Telephone Encounter (Signed)
 Provider aware of passing.

## 2024-10-13 ENCOUNTER — Ambulatory Visit: Admitting: Nurse Practitioner

## 2024-10-17 NOTE — TOC Progression Note (Signed)
 Transition of Care [facility]) - Progression Note    Patient Details  Name: Jason Estrada MRN: [id number] Date of Birth: Nov 03, 1940  Transition of Care [facility]) CM/SW Contact  Khai Torbert L Leo Fray, [place] Phone Number: 10/02/2024, 9:39 AM  Clinical Narrative:     CSW received a secure chat. Patient transitioning to comfort care. Spouse would like Scientist, [employer] to discuss Hospice Services with her.   CSW spoke spouse. She is at bedside. She confirmed that she would like [facility] to come and speak with her regarding Hospice Services.   Secure chat updated and referral made.   Expected Discharge Plan: Skilled Nursing Facility Barriers to Discharge: SNF Pending bed offer               Expected Discharge Plan and Services   Discharge Planning Services: CM Consult Post Acute Care Choice: Skilled Nursing Facility, Home Health Living arrangements for the past 2 months: Single Family Home                 DME Arranged: N/A                     Social Drivers of Health (SDOH) Interventions SDOH Screenings   Food Insecurity: No Food Insecurity (09/17/2024)   Received from [facility]  Housing: Low Risk  (09/17/2024)   Received from [facility]  Transportation Needs: No Transportation Needs (09/17/2024)   Received from [facility]  Utilities: Not At Risk (09/17/2024)   Received from [facility]  Depression [phone]): Low Risk (06/22/2024)  Financial Resource Strain: Low Risk  (09/17/2024)   Received from [facility]  Social Connections: Unknown (08/23/2024)  Tobacco Use: Low Risk (09/26/2024)    Readmission Risk Interventions     No data to display

## 2024-10-17 NOTE — Progress Notes (Addendum)
 " Progress Note   Patient: Jason Estrada FMW:[id number] DOB: 1941-03-17 DOA: 09/26/2024     6 DOS: the patient was seen and examined on 10/02/2024   Brief hospital course:  Jason Estrada is a 84 year old gentleman with a past medical history of dementia, prostate cancer gastroesophageal reflux disease, hypertension, chronic kidney disease stage IIIb and recent hospitalization for sepsis secondary to olecranon bursitis discharged 08/26/2024 who presented to the emergency department with mental status change presenting as inability to be aroused and somnolence.  The patient's wife [name] at the bedside provides most of the history.  She reports the patient was recently discharged from inpatient rehab after a 30-day short-term rehabilitation stay.  The patient was noted to have over the last 4 to 5 days a cough but continue to eat and drink normally as per the spouse.  She had been bringing in protein drinks and on the day of discharge from the rehab yesterday the patient had an episode of sundowning and confusion requiring extra time to arouse him.  Of note the patient had issues with inability to function with rehabilitation well at the facility due to the fact he was given Seroquel  and was overly sedated.  When the patient's home health care nurse was unable to arouse him this morning the activated EMS.  Of note the patient was reportedly bradycardic in the 40s with systolic pressure in the 100s when EMS arrived with pinpoint pupils as well as desaturation and reportedly responded well to Narcan although the patient's wife reports no opiates in the house and the patient is dependent on her for medications.  The patient had also been receiving aggressive diuresis reportedly with Lasix  for dependent edema which patient's wife reports is significantly improved now.  Patient is confused at baseline although the patient's wife believes he is now at his mental baseline.  Patient's wife cannot recall any past  echocardiograms or diagnosis of heart failure   Case discussed with emergency department provider and patient subsequently placed in admission.    Assessment and Plan:  Acute blood loss anemia Overnight patient developed multiple episodes of bleeding per rectum with a significant drop in his H&H from 10.7 >> 7.5 Unclear etiology for patient's bleeding, possible diverticular Patient had a CT angiogram of the abdomen and pelvis which showed no active arterial phase contrast extravasation into the bowel loops. On the portal venous phase images, there is a focal area of hyper density in the ascending colon near the hepatic flexure may represent intraluminal blood versus artifact from motion. If bleeding persists or recurs, consider colonoscopy and/or repeat CTA or tagged RBC scan for localization. Mild duodenal wall thickening with surrounding stranding without focal fluid collection, suggestive of duodenitis or peptic ulcer disease; consider upper endoscopic evaluation as clinically warranted. Discussed with patient's wife at the bedside, she does not want any aggressive interventions at this time and would like to speak to palliative care/hospice for goals of care.  Has requested comfort measures for her husband at this time. Palliative care consult placed   Toxic encephalopathy:  History of Dementia Etiology unclear and thought to be medication induced.   Patient was on baclofen  which is currently on hold Patient presented to the ER for evaluation of unresponsiveness and increased somnolence.  His mental status improved and patient was back to his baseline and oriented only to person not to place and time.      Rhinovirus/enterovirus infection:  Stable Continue supportive care Continue antitussives Off steroids Continue bronchodilator therapy and  incentive spirometry       Bradycardia:  Probably medication induced Continue to hold verapamil  and donepezil      Gout:  Continue  allopurinol       AKI on CKDIV:  Baseline serum creatinine of 1.7. Creatinine up trended to 2.6 and shows a downward trend Etiology may be prerenal and may be related to diuretic use Renal function is back to baseline     Lower extremity edema:  US  of b/l LE was neg.  CTA chest ordered b/c d-dimer was elevated.  No evidence of PE Echo shows EF 60-65%, grade I diastolic dysfunction, no regional wall motion abnormalities      Anemia of chronic kidney disease Likely secondary to CKD. No need for a transfusion currently     Ascending thoracic aortic aneurysm:  4 cm and incidental finding on CTA chest.  Will need repeat imagining in 1 years w/ CTA or MRA.      Elevated troponin:  trending down.  Likely secondary to demand ischemia        Dementia:  With behavioral disturbance.  Donepezil  is on hold secondary to bradycardia.  Haldol  prn for agitation.  Safety sitter     GERD:  Continue on PPI      Prostate cancer: Follow-up with oncology as an outpatient      HTN: Improved blood pressure control on amlodipine  Verapamil  is on hold due to bradycardia           Subjective: Patient is awake and alert.  Noted to be restless.  Able to be redirected.  Physical Exam: Vitals:   10/02/24 0400 10/02/24 0500 10/02/24 0545 10/02/24 0750  BP: (!) 100/51   129/64  Pulse:   85 85  Resp: (!) 25   20  Temp:  99.3 F (37.4 C) 99.3 F (37.4 C) 98.5 F (36.9 C)  TempSrc:   Axillary Oral  SpO2: 94%   97%  Weight:      Height:       General exam: Appears comfortable, denies having any pain, restless but redirectable Respiratory system: Bilateral air entry Cardiovascular system: S1/S2+. No rubs or clicks   Gastrointestinal system: Abd is soft, NT, ND & hypoactive bowel sounds, no abdominal pain Central nervous system: Awake and alert.  Oriented only to person Psychiatry: Judgement and insight appears poor. Flat mood and affect     Data Reviewed: Sodium 146, bicarb  12, BUN 41, creatinine 1.82 Labs review  Family Communication: Plan of care was discussed with patient's wife in detail. All questions and concerns were addressed. Patient will be placed on comfort measures  Disposition: Status is: Inpatient Remains inpatient appropriate because: Currently on comfort measures  Planned Discharge Destination: TBD    Time spent: 50  minutes  Author: Aimee Somerset, MD 10/02/2024 10:59 AM  For on call review [email or web address].  "

## 2024-10-17 NOTE — Plan of Care (Signed)
 Called pt's wife, Jason Estrada and informed her that husband had an incident of weakness and had a bloody bowel movement. We were giving him fluids and he will get a unit of blood.  Now, pt is being transferred to stepdown and will call wife back as soon as room has been assigned.

## 2024-10-17 NOTE — Plan of Care (Signed)
 " Problem: Education: Goal: Knowledge of General Education information will improve Description: Including pain rating scale, medication(s)/side effects and non-pharmacologic comfort measures 10/02/2024 0712 by Nettie Norris, LPN Outcome: Progressing 10/02/2024 0709 by Nettie Norris, LPN Outcome: Progressing   Problem: Health Behavior/Discharge Planning: Goal: Ability to manage health-related needs will improve 10/02/2024 0712 by Nettie Norris, LPN Outcome: Progressing 10/02/2024 0709 by Nettie Norris, LPN Outcome: Progressing   Problem: Clinical Measurements: Goal: Ability to maintain clinical measurements within normal limits will improve 10/02/2024 0712 by Nettie Norris, LPN Outcome: Progressing 10/02/2024 0709 by Nettie Norris, LPN Outcome: Progressing Goal: Will remain free from infection 10/02/2024 0712 by Nettie Norris, LPN Outcome: Progressing 10/02/2024 0709 by Nettie Norris, LPN Outcome: Progressing Goal: Diagnostic test results will improve 10/02/2024 0712 by Nettie Norris, LPN Outcome: Progressing 10/02/2024 0709 by Nettie Norris, LPN Outcome: Progressing Goal: Respiratory complications will improve 10/02/2024 0712 by Nettie Norris, LPN Outcome: Progressing 10/02/2024 0709 by Nettie Norris, LPN Outcome: Progressing Goal: Cardiovascular complication will be avoided 10/02/2024 0712 by Nettie Norris, LPN Outcome: Progressing 10/02/2024 0709 by Nettie Norris, LPN Outcome: Progressing   Problem: Activity: Goal: Risk for activity intolerance will decrease 10/02/2024 0712 by Nettie Norris, LPN Outcome: Progressing 10/02/2024 0709 by Nettie Norris, LPN Outcome: Progressing   Problem: Nutrition: Goal: Adequate nutrition will be maintained 10/02/2024 0712 by Nettie Norris, LPN Outcome: Progressing 10/02/2024 0709 by Nettie Norris, LPN Outcome: Progressing   Problem: Coping: Goal: Level  of anxiety will decrease 10/02/2024 0712 by Nettie Norris, LPN Outcome: Progressing 10/02/2024 0709 by Nettie Norris, LPN Outcome: Progressing   Problem: Elimination: Goal: Will not experience complications related to bowel motility 10/02/2024 0712 by Nettie Norris, LPN Outcome: Progressing 10/02/2024 0709 by Nettie Norris, LPN Outcome: Progressing Goal: Will not experience complications related to urinary retention 10/02/2024 0712 by Nettie Norris, LPN Outcome: Progressing 10/02/2024 0709 by Nettie Norris, LPN Outcome: Progressing   Problem: Pain Managment: Goal: General experience of comfort will improve and/or be controlled 10/02/2024 0712 by Nettie Norris, LPN Outcome: Progressing 10/02/2024 0709 by Nettie Norris, LPN Outcome: Progressing   Problem: Safety: Goal: Ability to remain free from injury will improve 10/02/2024 0712 by Nettie Norris, LPN Outcome: Progressing 10/02/2024 0709 by Nettie Norris, LPN Outcome: Progressing   Problem: Skin Integrity: Goal: Risk for impaired skin integrity will decrease 10/02/2024 0712 by Nettie Norris, LPN Outcome: Progressing 10/02/2024 0709 by Nettie Norris, LPN Outcome: Progressing   Problem: Education: Goal: Ability to describe self-care measures that may prevent or decrease complications (Diabetes Survival Skills Education) will improve 10/02/2024 0712 by Nettie Norris, LPN Outcome: Progressing 10/02/2024 0709 by Nettie Norris, LPN Outcome: Progressing Goal: Individualized Educational Video(s) 10/02/2024 0712 by Nettie Norris, LPN Outcome: Progressing 10/02/2024 0709 by Nettie Norris, LPN Outcome: Progressing   Problem: Coping: Goal: Ability to adjust to condition or change in health will improve 10/02/2024 0712 by Nettie Norris, LPN Outcome: Progressing 10/02/2024 0709 by Nettie Norris, LPN Outcome: Progressing   Problem: Fluid Volume: Goal:  Ability to maintain a balanced intake and output will improve 10/02/2024 0712 by Nettie Norris, LPN Outcome: Progressing 10/02/2024 0709 by Nettie Norris, LPN Outcome: Progressing   Problem: Health Behavior/Discharge Planning: Goal: Ability to identify and utilize available resources and services will improve 10/02/2024 0712 by Nettie Norris, LPN Outcome: Progressing 10/02/2024 0709 by Nettie Norris, LPN Outcome: Progressing Goal: Ability to manage health-related needs will improve 10/02/2024 0712 by Nettie Norris, LPN Outcome: Progressing 10/02/2024 0709 by Nettie Norris, LPN Outcome: Progressing   Problem: Metabolic: Goal: Ability to maintain appropriate  glucose levels will improve 10/02/2024 0712 by Nettie Norris, LPN Outcome: Progressing 10/02/2024 0709 by Nettie Norris, LPN Outcome: Progressing   Problem: Nutritional: Goal: Maintenance of adequate nutrition will improve 10/02/2024 0712 by Nettie Norris, LPN Outcome: Progressing 10/02/2024 0709 by Nettie Norris, LPN Outcome: Progressing Goal: Progress toward achieving an optimal weight will improve 10/02/2024 0712 by Nettie Norris, LPN Outcome: Progressing 10/02/2024 0709 by Nettie Norris, LPN Outcome: Progressing   Problem: Skin Integrity: Goal: Risk for impaired skin integrity will decrease 10/02/2024 0712 by Nettie Norris, LPN Outcome: Progressing 10/02/2024 0709 by Nettie Norris, LPN Outcome: Progressing   Problem: Tissue Perfusion: Goal: Adequacy of tissue perfusion will improve 10/02/2024 0712 by Nettie Norris, LPN Outcome: Progressing 10/02/2024 0709 by Nettie Norris, LPN Outcome: Progressing   "

## 2024-10-17 NOTE — Discharge Summary (Signed)
 " Physician Discharge Summary   Patient: Jason Estrada MRN: [id number] DOB: 1941-02-27  Admit date:     09/26/2024  Discharge date: 10/02/24  Discharge Physician: Amery Vandenbos   PCP: Cannady, Jolene T, Jason Estrada   Recommendations at discharge:   Patient is being discharged to an inpatient hospice  Discharge Diagnoses: Principal Problem:   ABLA (acute blood loss anemia) Active Problems:   Toxic encephalopathy   Gout   Bradycardia   AKI (acute kidney injury)   HTN (hypertension)   HLD (hyperlipidemia)   Prostate cancer   GERD (gastroesophageal reflux disease)   Dementia with behavioral disturbance (HCC)   Troponin level elevated   Anemia due to chronic kidney disease   Lower extremity edema  Resolved Problems:   * No resolved hospital problems. [facility] Course: Jason Estrada is an 84 year old gentleman with a past medical history of dementia, prostate cancer gastroesophageal reflux disease, hypertension, chronic kidney disease stage IIIb and recent hospitalization for sepsis secondary to olecranon bursitis discharged 08/26/2024 who presented to the emergency department with mental status change presenting as inability to be aroused and somnolence.  The patient's wife Jason Estrada at the bedside provides most of the history.  She reports the patient was recently discharged from inpatient rehab after a 30-day short-term rehabilitation stay.  The patient was noted to have over the last 4 to 5 days a cough but continue to eat and drink normally as per the spouse.  She had been bringing in protein drinks and on the day of discharge from the rehab yesterday the patient had an episode of sundowning and confusion requiring extra time to arouse him.  Of note the patient had issues with inability to function with rehabilitation well at the facility due to the fact he was given Seroquel  and was overly sedated.  When the patient's home health care nurse was unable to arouse him this morning the activated EMS.   Of note the patient was reportedly bradycardic in the 40s with systolic pressure in the 100s when EMS arrived with pinpoint pupils as well as desaturation and reportedly responded well to Narcan although the patient's wife reports no opiates in the house and the patient is dependent on her for medications.  The patient had also been receiving aggressive diuresis reportedly with Lasix  for dependent edema which patient's wife reports is significantly improved now.  Patient is confused at baseline although the patient's wife believes he is now at his mental baseline.  Patient's wife cannot recall any past echocardiograms or diagnosis of heart failure   Case discussed with emergency department provider and patient subsequently placed in admission.    Assessment and Plan:   Acute blood loss anemia Overnight patient developed multiple episodes of bleeding per rectum with a significant drop in his H&H from 10.7 >> 7.5 Unclear etiology for patient's bleeding, possible diverticular Patient had a CT angiogram of the abdomen and pelvis which showed no active arterial phase contrast extravasation into the bowel loops. On the portal venous phase images, there is a focal area of hyper density in the ascending colon near the hepatic flexure may represent intraluminal blood versus artifact from motion. If bleeding persists or recurs, consider colonoscopy and/or repeat CTA or tagged RBC scan for localization. Mild duodenal wall thickening with surrounding stranding without focal fluid collection, suggestive of duodenitis or peptic ulcer disease; consider upper endoscopic evaluation as clinically warranted. Discussed with patient's wife at the bedside, she does not want any aggressive interventions at this time and  discussed with palliative care. Patient is currently on comfort measures and will be discharged to an inpatient hospice    Toxic encephalopathy:  History of Dementia Etiology unclear and thought to be  medication induced.   Patient was on baclofen  which is currently on hold Patient presented to the ER for evaluation of unresponsiveness and increased somnolence.   His mental status improved and patient is back to his baseline and oriented only to person not to place and time.      Rhinovirus/enterovirus infection:  Stable Continue supportive care Continue antitussives Off steroids Continue bronchodilator therapy and incentive spirometry       Bradycardia:  Probably medication induced Continue to hold verapamil  and donepezil      Gout:  Continue allopurinol       AKI on CKDIV:  Baseline serum creatinine of 1.7. Creatinine up trended to 2.6 and showed a downward trend Patient likely to have worsening of his renal function since he received IV contrast the patient    Lower extremity edema:  US  of b/l LE was neg.  CTA chest ordered b/c d-dimer was elevated.  No evidence of PE Echo shows EF 60-65%, grade I diastolic dysfunction, no regional wall motion abnormalities      Anemia of chronic kidney disease Likely secondary to CKD. No need for a transfusion currently     Ascending thoracic aortic aneurysm:  4 cm and incidental finding on CTA chest.  Will need repeat imagining in 1 years w/ CTA or MRA.      Elevated troponin:  trending down.  Likely secondary to demand ischemia        Dementia:  With behavioral disturbance.  Donepezil  is on hold secondary to bradycardia.  Haldol  prn for agitation.       GERD:  Continue on PPI      Prostate cancer: Follow-up with oncology as an outpatient      HTN: Improved blood pressure control on amlodipine  Verapamil  is on hold due to bradycardia           Consultants: Palliative care Procedures performed: None Disposition: Hospice care Diet recommendation:  Regular diet DISCHARGE MEDICATION: Allergies as of 10/02/2024   No Known Allergies      Medication List     STOP taking these medications     acetaminophen  325 MG tablet Commonly known as: TYLENOL    allopurinol  100 MG tablet Commonly known as: ZYLOPRIM    baclofen  10 MG tablet Commonly known as: LIORESAL    colchicine  0.6 MG tablet   cyanocobalamin  1000 MCG tablet Commonly known as: VITAMIN B12   donepezil  23 MG Tabs tablet Commonly known as: ARICEPT    folic acid  1 MG tablet Commonly known as: FOLVITE    [name] Crea   hydrALAZINE  50 MG tablet Commonly known as: APRESOLINE    memantine  10 MG tablet Commonly known as: NAMENDA    pantoprazole  20 MG tablet Commonly known as: PROTONIX    pravastatin  40 MG tablet Commonly known as: PRAVACHOL    traZODone  50 MG tablet Commonly known as: DESYREL    verapamil  240 MG CR tablet Commonly known as: CALAN -SR       TAKE these medications    albuterol  (2.5 MG/3ML) 0.083% nebulizer solution Commonly known as: PROVENTIL  Take 3 mLs (2.5 mg total) by nebulization every 6 (six) hours as needed for wheezing or shortness of breath.        Follow-up Information     Jason Melanie DASEN, Jason Estrada Follow up.   Specialty: Nurse Practitioner Why: hospital follow up Contact  information: [address] [place] [address] [phone]                Discharge Exam: Jason Estrada   09/26/24 1308  Weight: 85.3 kg    General exam: Appears comfortable, denies having any pain, restless but redirectable Respiratory system: Bilateral air entry Cardiovascular system: S1/S2+. No rubs or clicks   Gastrointestinal system: Abd is soft, NT, ND & hypoactive bowel sounds, no abdominal pain Central nervous system: Awake and alert.  Oriented only to person Psychiatry: Judgement and insight appears poor. Flat mood and affect     Condition at discharge: poor  The results of significant diagnostics from this hospitalization (including imaging, microbiology, ancillary and laboratory) are listed below for reference.   Imaging Studies: CT Angio Abd/Pel w/ and/or  w/o Result Date: 10/02/2024 EXAM: CTA ABDOMEN AND PELVIS WITHOUT AND WITH CONTRAST 10/02/2024 07:03:39 AM TECHNIQUE: CTA images of the abdomen and pelvis without and with intravenous contrast. 100 mL iohexol  (OMNIPAQUE ) 350 MG/ML injection. Three-dimensional MIP/volume rendered formations were performed. Automated exposure control, iterative reconstruction, and/or weight based adjustment of the mA/kV was utilized to reduce the radiation dose to as low as reasonably achievable. COMPARISON: PET/CT 11/21/2021. CLINICAL HISTORY: Lower GI bleed; New onset bloody bm. FINDINGS: VASCULATURE: Aortic atherosclerosis and coronary artery calcifications. AORTA: Aortic atherosclerotic calcification. No acute finding. No abdominal aortic aneurysm. No dissection. CELIAC TRUNK: No acute finding. No occlusion or significant stenosis. SUPERIOR MESENTERIC ARTERY: No acute finding. No occlusion or significant stenosis. RENAL ARTERIES: No acute finding. No occlusion or significant stenosis. ILIAC ARTERIES: No acute finding. No occlusion or significant stenosis. LIVER: Cyst within dome of the liver measures 4.5 cm. No suspicious liver lesions. GALLBLADDER AND BILE DUCTS: Status post cholecystectomy. No biliary ductal dilatation. SPLEEN: Calcified granulomas within the spleen. PANCREAS: No pancreatic main duct dilatation, inflammation or solid mass. Cyst arising from the distal tail of the pancreas measures 1.2 cm and 4 Hounsfield units without internal enhancement. New since previous PET CT. ADRENAL GLANDS: Bilateral adrenal glands demonstrate no acute abnormality. No periadrenal stranding. KIDNEYS, URETERS AND BLADDER: Bilateral renal cortical and parenchymal volume loss. 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 The largest arises from the upper pole of the left kidney measuring 5.7 cm. Unless otherwise clinically indicated, no follow-up imaging is recommended for bosniak class 1 kidney cyst. No stones in the kidneys or ureters.  No hydronephrosis. No perinephric or periureteral stranding. Urinary bladder is unremarkable. GI AND BOWEL: Esophagus. There is wall thickening with surrounding soft tissue stranding involving the duodenum. No focal fluid collections. No pathologic dilatation of the large or small bowel loops. There is sigmoid diverticulosis without signs of acute diverticulitis. The appendix is visualized and appears normal. On the arterial phase images there are no signs of active intraluminal contrast extravasation. On the portal venous phase images a small focus of hyperdensity within the lumen of the ascending colon just before the hepatic flexure, coronal image 52/15 and axial image 27/13. There is some motion artifact within this area, diminishing exam detail. REPRODUCTIVE: Prostate gland is normal. PERITONEUM AND RETROPERITONEUM: No ascites or free air. LUNG BASE: Pleural thickening and subsegmental atelectasis noted within the left base. LYMPH NODES: No lymphadenopathy. BONES AND SOFT TISSUES: No acute abnormality of the bones. No acute soft tissue abnormality. IMPRESSION: 1. There is no active arterial phase contrast extravasation into the bowel loops. On the portal venous phase images, there is a focal area of hyperdensity in the ascending colon near the hepatic  flexure may represent intraluminal blood versus artifact from motion. If bleeding persists or recurs, consider colonoscopy and/or repeat CTA or tagged RBC scan for localization. 2. Mild duodenal wall thickening with surrounding stranding without focal fluid collection, suggestive of duodenitis or peptic ulcer disease; consider upper endoscopic evaluation as clinically warranted. 3. Several unsuccessful attempts were made to contact the ordering provider . Results of this study were ultimately given to Nurse Laurel on 10/02/2024 at 7:57 am Electronically signed by: Waddell Calk MD 10/02/2024 07:57 AM EST RP Workstation: [id number]   CT Angio Chest Pulmonary  Embolism (PE) W or WO Contrast Result Date: 09/27/2024 CLINICAL DATA:  Short of breath, cough EXAM: CT ANGIOGRAPHY CHEST WITH CONTRAST TECHNIQUE: Multidetector CT imaging of the chest was performed using the standard protocol during bolus administration of intravenous contrast. Multiplanar CT image reconstructions and MIPs were obtained to evaluate the vascular anatomy. RADIATION DOSE REDUCTION: This exam was performed according to the departmental dose-optimization program which includes automated exposure control, adjustment of the mA and/or kV according to patient size and/or use of iterative reconstruction technique. CONTRAST:  OMNIPAQUE  IOHEXOL  350 MG/ML SOLN COMPARISON:  09/26/2024 FINDINGS: Cardiovascular: This is a technically adequate evaluation of the pulmonary vasculature. No filling defects or pulmonary emboli. The heart is enlarged without pericardial effusion. 4 cm ascending thoracic aortic aneurysm. No evidence of dissection. Atherosclerosis of the aorta and coronary vasculature. Mediastinum/Nodes: No enlarged mediastinal, hilar, or axillary lymph nodes. Thyroid gland, trachea, and esophagus demonstrate no significant findings. Lungs/Pleura: Trace left pleural effusion. Dependent hypoventilatory changes are seen bilaterally, left greater than right. No acute airspace disease. No pneumothorax. Upper Abdomen: No acute abnormality. Musculoskeletal: No acute or destructive bony abnormalities. Reconstructed images demonstrate no additional findings. Review of the MIP images confirms the above findings. IMPRESSION: 1. No evidence of pulmonary embolus. 2. Cardiomegaly. 3. 4 cm ascending thoracic aortic aneurysm. Recommend annual imaging followup by CTA or MRA. This recommendation follows 2010 ACCF/AHA/AATS/ACR/ASA/SCA/SCAI/SIR/STS/SVM Guidelines for the Diagnosis and Management of Patients with Thoracic Aortic Disease. Circulation. 2010; 121: Z733-z630. Aortic aneurysm NOS (ICD10-I71.9) 4. Bilateral  hypoventilatory changes.  No acute airspace disease. 5. Trace left pleural effusion. 6.  Aortic Atherosclerosis (ICD10-I70.0). Electronically Signed   By: Ozell Daring M.D.   On: 09/27/2024 18:01   ECHOCARDIOGRAM COMPLETE Result Date: 09/27/2024    ECHOCARDIOGRAM REPORT   Patient Name:   Jason Estrada Date of Exam: 09/27/2024 Medical Rec #:  [id number]    Height:       72.0 in Accession #:    [id number]   Weight:       188.1 lb Date of Birth:  05-Apr-1941    BSA:          2.076 m Patient Age:    83 years     BP:           153/84 mmHg Patient Gender: M            HR:           80 bpm. Exam Location:  [facility] Procedure: 2D Echo, Cardiac Doppler and Color Doppler (Both Spectral and Color            Flow Doppler were utilized during procedure). Indications:     Abnormal ECG R94.31  History:         Patient has no prior history of Echocardiogram examinations.                  Risk Factors:Hypertension and Dyslipidemia.  Sonographer:  Christopher Furnace Referring Phys:  [id number] PRENTICE BROCKS CORE Diagnosing Phys: Cara JONETTA Lovelace MD IMPRESSIONS  1. Left ventricular ejection fraction, by estimation, is 60 to 65%. The left ventricle has normal function. The left ventricle has no regional wall motion abnormalities. Left ventricular diastolic parameters are consistent with Grade I diastolic dysfunction (impaired relaxation).  2. Right ventricular systolic function is normal. The right ventricular size is normal.  3. The mitral valve is normal in structure. Trivial mitral valve regurgitation.  4. The aortic valve is normal in structure. Aortic valve regurgitation is mild. FINDINGS  Left Ventricle: Left ventricular ejection fraction, by estimation, is 60 to 65%. The left ventricle has normal function. The left ventricle has no regional wall motion abnormalities. Strain was performed and the global longitudinal strain is indeterminate. The left ventricular internal cavity size was normal in size. There is borderline left ventricular  hypertrophy. Left ventricular diastolic parameters are consistent with Grade I diastolic dysfunction (impaired relaxation). Right Ventricle: The right ventricular size is normal. No increase in right ventricular wall thickness. Right ventricular systolic function is normal. Left Atrium: Left atrial size was normal in size. Right Atrium: Right atrial size was normal in size. Pericardium: There is no evidence of pericardial effusion. Mitral Valve: The mitral valve is normal in structure. Trivial mitral valve regurgitation. MV peak gradient, 7.0 mmHg. The mean mitral valve gradient is 2.0 mmHg. Tricuspid Valve: The tricuspid valve is normal in structure. Tricuspid valve regurgitation is trivial. Aortic Valve: The aortic valve is normal in structure. Aortic valve regurgitation is mild. Aortic valve mean gradient measures 5.0 mmHg. Aortic valve peak gradient measures 8.8 mmHg. Aortic valve area, by VTI measures 3.96 cm. Pulmonic Valve: The pulmonic valve was not well visualized. Pulmonic valve regurgitation is not visualized. Aorta: The aortic root was not well visualized. IAS/Shunts: No atrial level shunt detected by color flow Doppler. Additional Comments: 3D was performed not requiring image post processing on an independent workstation and was normal.  LEFT VENTRICLE PLAX 2D LVIDd:         4.40 cm   Diastology LVIDs:         3.00 cm   LV e' medial:    12.70 cm/s LV PW:         1.10 cm   LV E/e' medial:  9.8 LV IVS:        1.20 cm   LV e' lateral:   8.05 cm/s LVOT diam:     2.10 cm   LV E/e' lateral: 15.5 LV SV:         120 LV SV Index:   58 LVOT Area:     3.46 cm LV IVRT:       92 msec  RIGHT VENTRICLE RV Basal diam:  3.80 cm RV Mid diam:    3.00 cm RV S prime:     15.40 cm/s TAPSE (M-mode): 2.6 cm LEFT ATRIUM           Index        RIGHT ATRIUM           Index LA diam:      3.40 cm 1.64 cm/m   RA Area:     21.80 cm LA Vol (A4C): 51.2 ml 24.67 ml/m  RA Volume:   68.90 ml  33.20 ml/m  AORTIC VALVE AV Area  (Vmax):    3.14 cm AV Area (Vmean):   3.16 cm AV Area (VTI):     3.96 cm AV Vmax:  148.00 cm/s AV Vmean:          99.500 cm/s AV VTI:            0.303 m AV Peak Grad:      8.8 mmHg AV Mean Grad:      5.0 mmHg LVOT Vmax:         134.00 cm/s LVOT Vmean:        90.700 cm/s LVOT VTI:          0.346 m LVOT/AV VTI ratio: 1.14  AORTA Ao Root diam: 4.00 cm MITRAL VALVE                TRICUSPID VALVE MV Area (PHT): 3.27 cm     TR Peak grad:   6.6 mmHg MV Area VTI:   2.91 cm     TR Vmax:        128.00 cm/s MV Peak grad:  7.0 mmHg MV Mean grad:  2.0 mmHg     SHUNTS MV Vmax:       1.32 m/s     Systemic VTI:  0.35 m MV Vmean:      69.8 cm/s    Systemic Diam: 2.10 cm MV Decel Time: 232 msec MV E velocity: 125.00 cm/s MV A velocity: 144.00 cm/s MV E/A ratio:  0.87 Dwayne D Callwood MD Electronically signed by Cara JONETTA Lovelace MD Signature Date/Time: 09/27/2024/4:39:43 PM    Final    US  Venous Img Lower Bilateral (DVT) Result Date: 09/26/2024 CLINICAL DATA:  Positive D-dimer. EXAM: BILATERAL LOWER EXTREMITY VENOUS DOPPLER ULTRASOUND TECHNIQUE: Gray-scale sonography with compression, as well as color and duplex ultrasound, were performed to evaluate the deep venous system(s) from the level of the common femoral vein through the popliteal and proximal calf veins. COMPARISON:  None Available. FINDINGS: VENOUS Normal compressibility of the common femoral, superficial femoral, and popliteal veins, as well as the visualized calf veins. Visualized portions of profunda femoral vein and great saphenous vein unremarkable. No filling defects to suggest DVT on grayscale or color Doppler imaging. Doppler waveforms show normal direction of venous flow, normal respiratory plasticity and response to augmentation. Limited views of the contralateral common femoral vein are unremarkable. OTHER Fluid collection is seen in the left popliteal fossa measuring 5.5 x 1.5 x 4.2 cm. Limitations: none IMPRESSION: 1. No evidence of deep venous  thrombosis bilaterally. 2. Fluid collection in the left popliteal fossa, suggesting Baker's cyst. Electronically Signed   By: Leita Birmingham M.D.   On: 09/26/2024 18:14   US  RENAL Result Date: 09/26/2024 CLINICAL DATA:  Acute kidney injury. EXAM: RENAL / URINARY TRACT ULTRASOUND COMPLETE COMPARISON:  None Available. FINDINGS: Right Kidney: Renal measurements: 9.6 cm x 5.4 cm x 4.7 cm = volume: 127.9 mL. There is diffusely increased echogenicity of the renal parenchyma. A 1.6 cm x 1.6 cm x 1.3 cm simple cyst is seen within the upper pole of the right kidney. No hydronephrosis is visualized. Left Kidney: Renal measurements: 9.6 cm x 5.4 cm x 4.9 cm = volume: 133.6 mL. There is diffusely increased echogenicity of the renal parenchyma. 4.0 cm x 3.8 cm x 3.5 cm and 5.6 cm x 4.9 cm x 5.5 cm simple renal cysts are seen within the left kidney. No hydronephrosis is visualized. Bladder: Appears normal for degree of bladder distention. Other: Of incidental note is the presence of a 4.1 cm x 4.4 cm x 3.8 cm hepatic cysts. IMPRESSION: 1. Large hepatic and bilateral simple renal cysts. 2. Bilateral echogenic kidneys  which may represent sequelae associated with medical renal disease. Electronically Signed   By: Suzen Dials M.D.   On: 09/26/2024 15:43   CT Head Wo Contrast Result Date: 09/26/2024 CLINICAL DATA:  Altered mental status. EXAM: CT HEAD WITHOUT CONTRAST TECHNIQUE: Contiguous axial images were obtained from the base of the skull through the vertex without intravenous contrast. RADIATION DOSE REDUCTION: This exam was performed according to the departmental dose-optimization program which includes automated exposure control, adjustment of the mA and/or kV according to patient size and/or use of iterative reconstruction technique. COMPARISON:  08/22/2024 FINDINGS: Brain: No evidence of intracranial hemorrhage, acute infarction, hydrocephalus, extra-axial collection, or mass lesion/mass effect. Mild-to-moderate  diffuse cerebral atrophy and chronic small vessel disease show no significant change. Vascular:  No hyperdense vessel or other acute findings. Skull: No evidence of fracture or other significant bone abnormality. Sinuses/Orbits:  No acute findings. Other: None. IMPRESSION: No acute intracranial abnormality. Stable cerebral atrophy and chronic small vessel disease. Electronically Signed   By: Norleen DELENA Kil M.D.   On: 09/26/2024 13:57   DG Chest 1 View Result Date: 09/26/2024 CLINICAL DATA:  Cough. EXAM: CHEST  1 VIEW COMPARISON:  August 22, 2024 FINDINGS: The heart size and mediastinal contours are within normal limits. Low lung volumes are noted with mild, stable elevation of the left hemidiaphragm. Mild atelectasis is seen within the left lung base. No pleural effusion or pneumothorax is identified. Multilevel degenerative changes are seen throughout the thoracic spine. IMPRESSION: Low lung volumes with mild left basilar atelectasis. Electronically Signed   By: Suzen Dials M.D.   On: 09/26/2024 13:41    Microbiology: Results for orders placed or performed during the hospital encounter of 09/26/24  Resp panel by RT-PCR (RSV, Flu A&B, Covid) Anterior Nasal Swab     Status: None   Collection Time: 09/26/24  1:38 PM   Specimen: Anterior Nasal Swab  Result Value Ref Range Status   SARS Coronavirus 2 by RT PCR NEGATIVE NEGATIVE Final    Comment: (NOTE) SARS-CoV-2 target nucleic acids are NOT DETECTED.  The SARS-CoV-2 RNA is generally detectable in upper respiratory specimens during the acute phase of infection. The lowest concentration of SARS-CoV-2 viral copies this assay can detect is 138 copies/mL. A negative result does not preclude SARS-Cov-2 infection and should not be used as the sole basis for treatment or other patient management decisions. A negative result may occur with  improper specimen collection/handling, submission of specimen other than nasopharyngeal swab, presence of viral  mutation(s) within the areas targeted by this assay, and inadequate number of viral copies(<138 copies/mL). A negative result must be combined with clinical observations, patient history, and epidemiological information. The expected result is Negative.  Fact Sheet for Patients:  bloggercourse.com  Fact Sheet for Healthcare Providers:  seriousbroker.it  This test is no t yet approved or cleared by the [place]  FDA and  has been authorized for detection and/or diagnosis of SARS-CoV-2 by FDA under an Emergency Use Authorization (EUA). This EUA will remain  in effect (meaning this test can be used) for the duration of the COVID-19 declaration under Section 564(b)(1) of the Act, 21 U.S.C.section 360bbb-3(b)(1), unless the authorization is terminated  or revoked sooner.       Influenza A by PCR NEGATIVE NEGATIVE Final   Influenza B by PCR NEGATIVE NEGATIVE Final    Comment: (NOTE) The Xpert Xpress SARS-CoV-2/FLU/RSV plus assay is intended as an aid in the diagnosis of influenza from Nasopharyngeal swab specimens and should not  be used as a sole basis for treatment. Nasal washings and aspirates are unacceptable for Xpert Xpress SARS-CoV-2/FLU/RSV testing.  Fact Sheet for Patients: bloggercourse.com  Fact Sheet for Healthcare Providers: seriousbroker.it  This test is not yet approved or cleared by the [place]  FDA and has been authorized for detection and/or diagnosis of SARS-CoV-2 by FDA under an Emergency Use Authorization (EUA). This EUA will remain in effect (meaning this test can be used) for the duration of the COVID-19 declaration under Section 564(b)(1) of the Act, 21 U.S.C. section 360bbb-3(b)(1), unless the authorization is terminated or revoked.     Resp Syncytial Virus by PCR NEGATIVE NEGATIVE Final    Comment: (NOTE) Fact Sheet for  Patients: bloggercourse.com  Fact Sheet for Healthcare Providers: seriousbroker.it  This test is not yet approved or cleared by the [place]  FDA and has been authorized for detection and/or diagnosis of SARS-CoV-2 by FDA under an Emergency Use Authorization (EUA). This EUA will remain in effect (meaning this test can be used) for the duration of the COVID-19 declaration under Section 564(b)(1) of the Act, 21 U.S.C. section 360bbb-3(b)(1), unless the authorization is terminated or revoked.  Performed at [facility], [address] [place], [place] [address]   Respiratory (~20 pathogens) panel by PCR     Status: Abnormal   Collection Time: 09/27/24 11:20 AM   Specimen: Nasopharyngeal Swab; Respiratory  Result Value Ref Range Status   Adenovirus NOT DETECTED NOT DETECTED Final   Coronavirus 229E NOT DETECTED NOT DETECTED Final    Comment: (NOTE) The Coronavirus on the Respiratory Panel, DOES NOT test for the novel  Coronavirus (2019 nCoV)    Coronavirus HKU1 NOT DETECTED NOT DETECTED Final   Coronavirus NL63 NOT DETECTED NOT DETECTED Final   Coronavirus OC43 NOT DETECTED NOT DETECTED Final   Metapneumovirus NOT DETECTED NOT DETECTED Final   Rhinovirus / Enterovirus DETECTED (A) NOT DETECTED Final   Influenza A NOT DETECTED NOT DETECTED Final   Influenza B NOT DETECTED NOT DETECTED Final   Parainfluenza Virus 1 NOT DETECTED NOT DETECTED Final   Parainfluenza Virus 2 NOT DETECTED NOT DETECTED Final   Parainfluenza Virus 3 NOT DETECTED NOT DETECTED Final   Parainfluenza Virus 4 NOT DETECTED NOT DETECTED Final   Respiratory Syncytial Virus NOT DETECTED NOT DETECTED Final   Bordetella pertussis NOT DETECTED NOT DETECTED Final   Bordetella Parapertussis NOT DETECTED NOT DETECTED Final   Chlamydophila pneumoniae NOT DETECTED NOT DETECTED Final   Mycoplasma pneumoniae NOT DETECTED NOT DETECTED Final    Comment:  Performed at [facility], [address] [place], [place] [address]    Labs: CBC: Recent Labs  Lab 09/26/24 1320 09/26/24 1524 09/27/24 1205 09/28/24 0545 09/29/24 0514 10/02/24 0152  WBC 5.6 4.7 6.6 7.4 7.3 7.4  NEUTROABS 4.1  --   --   --   --   --   HGB 9.2* 8.6* 9.4* 10.2* 10.7* 7.5*  HCT 30.5* 28.7* 30.6* 35.0* 34.3* 25.4*  MCV 99.7 99.3 97.8 101.4* 97.2 101.2*  PLT 238 193 224 248 300 286   Basic Metabolic Panel: Recent Labs  Lab 09/28/24 0545 09/29/24 0514 09/30/24 0500 10/01/24 0811 10/02/24 0152  NA 145 144 142 141 146*  K 4.5 4.3 4.0 4.0 4.0  CL 111 108 108 109 115*  CO2 26 25 25 24  12*  GLUCOSE 91 155* 94 98 136*  BUN 26* 35* 42* 39* 41*  CREATININE 1.81* 1.99* 1.99* 1.83* 1.82*  CALCIUM 9.1  9.1 9.2 8.9 7.1*   Liver Function Tests: Recent Labs  Lab 09/26/24 1320 09/27/24 1205  AST 31 29  ALT 20 21  ALKPHOS 111 113  BILITOT 0.4 0.4  PROT 6.1* 5.8*  ALBUMIN 3.4* 3.2*   CBG: Recent Labs  Lab 09/28/24 1157 10/02/24 0141  GLUCAP 76 103*    Discharge time spent: greater than 30 minutes.  Signed: Aimee Somerset, MD [facility] 10/02/2024 "

## 2024-10-17 NOTE — Progress Notes (Addendum)
 "                                                                                                                                                                                               Palliative Care Progress Note, Assessment & Plan   Patient Name: Jason Estrada       Date: 10/02/2024 DOB: November 14, 1940  Age: 84 y.o. MRN#: [id number] Attending Physician: Lanetta Lingo, MD Primary Care Physician: [name], NP Admit Date: 09/26/2024  Subjective: Denies pain.   HPI: Per previous HPI: 84 year old gentleman with a past medical history of dementia, prostate cancer gastroesophageal reflux disease, hypertension, chronic kidney disease stage IIIb and recent hospitalization for sepsis secondary to olecranon bursitis discharged 08/26/2024 who presented to the emergency department with mental status change presenting as inability to be aroused and somnolence. The patient's wife Jason Estrada at the bedside provides most of the history. She reports the patient was recently discharged from inpatient rehab after a 30-day short-term rehabilitation stay. The patient was noted to have over the last 4 to 5 days a cough but continue to eat and drink normally as per the spouse.   TRH was consulted for admission and management of toxic encephalopathy, dementia, rhinovirus/enterovirus infection, bradycardia, AKI on CKD IV, lower extremity edema and elevated troponin, likely 2/2 demand ischemia.   Palliative medicine team was consulted for assistance with goals of care conversations.   Following up today after receiving message from RN wife wants to transition to comfort comfort after overnight events.   Summary of counseling/coordination of care Chart reviewed:   Labs: Baseline renal function. WBC WNL. Hgb 7.5 today from 10.7, 3 days prior.     Latest Ref Rng & Units 10/02/2024    1:52 AM 09/29/2024    5:14 AM 09/28/2024    5:45 AM  CBC  WBC 4.0 - 10.5 K/uL 7.4  7.3  7.4   Hemoglobin 13.0 - 17.0 g/dL 7.5   89.2  89.7   Hematocrit 39.0 - 52.0 % 25.4  34.3  35.0   Platelets 150 - 400 K/uL 286  300  248       Latest Ref Rng & Units 10/02/2024    1:52 AM 10/01/2024    8:11 AM 09/30/2024    5:00 AM  CMP  Glucose 70 - 99 mg/dL 863  98  94   BUN 8 - 23 mg/dL 41  39  42   Creatinine 0.61 - 1.24 mg/dL 8.17  8.16  8.00   Sodium 135 - 145 mmol/L 146  141  142   Potassium 3.5 - 5.1 mmol/L 4.0  4.0  4.0   Chloride 98 - 111 mmol/L 115  109  108   CO2 22 - 32 mmol/L 12  24  25    Calcium 8.9 - 10.3 mg/dL 7.1  8.9  9.2     Vitals: Blood pressure 129/64, pulse 85, temperature 98.5 F (36.9 C), temperature source Oral, resp. rate 20, height 6' (1.829 m), weight 85.3 kg, SpO2 97%.   Progress notes: Reviewed progress notes from ED staff/providers, TRH, PMT, PT/OT, TOC, SLP, gastroenterology and nursing staff.   Imaging: CTA AP 1. There is no active arterial phase contrast extravasation into the bowel loops. On the portal venous phase images, there is a focal area of hyperdensity in the ascending colon near the hepatic flexure may represent intraluminal blood versus artifact from motion. If bleeding persists or recurs, consider colonoscopy and/or repeat CTA or tagged RBC scan for localization. 2. Mild duodenal wall thickening with surrounding stranding without focal fluid collection, suggestive of duodenitis or peptic ulcer disease; consider upper endoscopic evaluation as clinically warranted. 3. Several unsuccessful attempts were made to contact the ordering provider . Results of this study were ultimately given to Nurse Laurel on 10/02/2024 at 7:57 am  MAR: Changes made to [facility] reflect transition to comfort measures only status  ACP documents: None on file   After reviewing the patient's chart and assessing the patient at bedside, I spoke with patient's wife, Jason Estrada, in regards to symptom management and goals of care.   Ill-appearing, elderly male sitting up in bed. He is alert but does not  acknowledge my presence. Due to advanced dementia, patient is not able to participate in goals of care conversations. He attempts several times during visit to get out of bed, requiring safety sitter to be at bedside. He appears agitated asking his wife to get the car. Noted to have occasional congested cough. He is tachypneic but in no distress. Dr. Lanetta present at bedside during portion of visit.   Patients wife had previous conversations with PMT provider this week and decided to have OP palliative follow up with plan to transfer to SNF for rehab. Wife states she received a phone call overnight stating her husband was found to be minimally responsive with bloody bowel movement and low blood pressure. She shares GI was consulted and found patient not to be a candidate for colonoscopy due to risk of anesthesia.   Jason Estrada shares that her husband was very clear in living will to have no life saving interventions. She shares that he even joked about having a DNR tattooed on chest because of his wishes for no CPR, no ventilator, no artifical feeding and no dialysis. In light of his continuing rectal bleeding, advanced dementia, poor po intake and poor prognosis, Jason Estrada feels that comfort focused care/hospice is best at this time. She expresses the importance of honoring his wishes to not extend life, especially in the setting of advanced dementia.   I explained comfort care as care where the patient would no longer receive aggressive medical interventions such as continuous vital signs, lab work, radiology testing, or medications not focused on comfort, peace, and dignity. This includes stopping antibiotics and weaning oxygen to room air, as these are generally not accepted as providing comfort but only prolonging the dying process artificially. All care would focus on how the patient is looking and feeling. This would include management of any symptoms that may cause discomfort, pain, shortness of breath/air  hunger, increased work of breathing, cough, nausea, agitation/restlessness, anxiety, and/or  secretions etc. Symptoms would be managed with medications and other non-pharmacological interventions such as spiritual support if requested, repositioning, music therapy, or therapeutic listening. Jason Estrada verbalized understanding and agreement for end of life care.  Comfort care orders placed.   Jason Estrada expressed interest in going home with hospice care. Advised that [facility] liaison would be contacting her today to discuss options for home with hospice and even inpatient hospice home care.  TOC consult placed for referral for hospice care.    Therapeutic silence and active listening provided for patient's wife to share her thoughts and emotions regarding current medical situation.  Emotional support provided.  Physical Exam Vitals reviewed.  Constitutional:      General: He is not in acute distress.    Appearance: He is ill-appearing.  HENT:     Head: Normocephalic and atraumatic.     Mouth/Throat:     Mouth: Mucous membranes are dry.  Pulmonary:     Effort: No respiratory distress.     Comments: Tachypnea   Abdominal:     General: There is no distension.     Palpations: Abdomen is soft.     Tenderness: There is no abdominal tenderness. There is no guarding.  Skin:    General: Skin is warm and dry.     Findings: Bruising present.  Neurological:     Mental Status: He is alert. He is disoriented.     Motor: Weakness present.  Psychiatric:        Mood and Affect: Mood is anxious.        Behavior: Behavior is agitated.     Recommendations/Plan: DNR-comfort measures only Please utilize comfort medications as needed Please medicate patient prior to transport Patient accepted to [facility] hospice with plan to transfer today     Discussed plan of care with Dr. Lanetta Harbour, [facility] liaison, [place], [facility] and primary RN.  I personally spent a total of 80 minutes in the care of the patient today  including preparing to see the patient, getting/reviewing separately obtained history, performing a medically appropriate exam/evaluation, counseling and educating, placing orders, referring and communicating with other health care professionals, documenting clinical information in the EHR, independently interpreting results, communicating results, and coordinating care.  Addendum 1345: Received message from [place], [facility] liaison, that patient has been accepted to inpatient hospice home and patients wife has accepted bed with plan to transfer today.   Jason Estrada Sacks, ELNITA- [facility] Palliative Medicine Team  10/02/2024 9:30 AM  Office [phone]  Pager [phone]     "

## 2024-10-17 NOTE — Progress Notes (Signed)
 SPIRITUAL CARE AND COUNSELING CONSULT NOTE   VISIT SUMMARY- Medical alert team and floor staff were working with patient. Patient was unavailable for chaplain to talk to.  SPIRITUAL ENCOUNTER                                                                                                                                                                      Type of Visit: Initial Care provided to:: Pt not available Conversation partners present during encounter: Nurse Reason for visit: Code OnCall Visit: Yes   SPIRITUAL FRAMEWORK      GOALS       INTERVENTIONS   Spiritual Care Interventions Made: Established relationship of care and support    INTERVENTION OUTCOMES   Outcomes: Connection to spiritual care  SPIRITUAL CARE PLAN        If immediate needs arise, please contact [facility] 24 hour on call [phone]   DeJuan O Harris  10/02/2024 2:22 AM

## 2024-10-17 NOTE — Plan of Care (Signed)

## 2024-10-17 NOTE — Progress Notes (Signed)
 [facility] [address] [employer]  Hospice hospital liaison note:   Referral received from [facility] for family interest in Hospice Home.    Met with wife outside of room to explain services and hospice philosophy and all questions answered.  Hospice home is able to accept patient this afternoon once consents are complete.    RN staff, you may call report at any time to [phone] room is assigned when report is called.  Please leave IV intact and send completed DNR with patient.   Updated attending and [facility] manager via [employer]. Thank you for the opportunity to participate in this patient's care Hunter Seip, BSN, RN Hospice nurse liaison [phone]

## 2024-10-17 NOTE — Consult Note (Signed)
 "  [name] , MD [address], [place], [place], [address] Phone: [phone] Fax: [phone]  Consultation  Referring Provider:     No ref. provider found Primary Care Physician:  [name], NP Primary Gastroenterologist:  None         Reason for Consultation:     GI bleed  Date of Admission:  09/26/2024 Date of Consultation:  10/02/2024         HPI:   Jason Estrada is a 84 y.o. male is a gentleman with a past medical history of dementia, prostate cancer, GERD, hypertension, CKD presented to the emergency room with change in mental status on 09/26/2024, being treated for a rhinovirus infection found to have bradycardia had AKI.   During the night approximately around 3 AM I was contacted because the patient had a bloody bowel movement, subsequently a CT angiogram was performed that showed no active bleeding but there was concern of an area in the ascending colon near the hepatic flexure representing intraluminal blood versus motion artifact.  Mild duodenal wall thickening and surrounding stranding without focal fluid collection suggestive of duodenitis was also noted.  There is thickening of the esophagus sigmoid diverticulosis without sign of diverticulitis.  Baseline hemoglobin 5 days back was between 8.6 to 9.4 g and this morning is 7.5 g.  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 This was completed in June 2023, and initial ADT injection was given 12/10/2021.    Sinus tachycardia 09/26/2021   Vitamin B12 deficiency 02/18/2023   229 December 2023    Past Surgical History:  Procedure Laterality Date   CHOLECYSTECTOMY      Prior to Admission medications  Medication Sig Start Date End Date Taking? Authorizing Provider  acetaminophen  (TYLENOL ) 325 MG tablet Take 2 tablets (650 mg total) by mouth every 6 (six) hours as needed for mild pain (pain score 1-3) or fever (or Fever >/= 101). 08/26/24  Yes Franchot Novel, MD  allopurinol  (ZYLOPRIM ) 100 MG tablet Take 1 tablet (100 mg total) by mouth daily. 08/21/23  Yes Cannady, Jolene T, NP  baclofen  (LIORESAL ) 10 MG tablet Take 0.5 tablets (5 mg total) by mouth daily as needed. 08/26/24  Yes Franchot Novel, MD  cyanocobalamin  (VITAMIN B12) 1000 MCG tablet Take 1,000 mcg by mouth daily.   Yes [provider]  donepezil  (ARICEPT ) 23 MG TABS tablet TAKE 1 TABLET (23 MG TOTAL) BY MOUTH DAILY. 09/01/24  Yes Wertman, Sara E, PA-C  folic acid  (FOLVITE ) 1 MG tablet TAKE 1 TABLET BY  MOUTH EVERY DAY 08/18/24  Yes Cannady, Jolene T, NP  hydrALAZINE  (APRESOLINE ) 50 MG tablet Take 0.5 tablets (25 mg total) by mouth in the morning and at bedtime. Patient taking differently: Take 50 mg by mouth daily. 08/21/23  Yes Cannady, Jolene T, NP  memantine  (NAMENDA ) 10 MG tablet TAKE 1 TABLET BY MOUTH TWICE A DAY 09/17/24  Yes Wertman, Sara E, PA-C  Nystatin ([name]) CREA Apply 1 Application topically 3 (three) times daily. 08/26/24  Yes Franchot Novel, MD  pantoprazole  (PROTONIX )  20 MG tablet Take 1 tablet (20 mg total) by mouth daily. 08/21/23  Yes Cannady, Jolene T, NP  pravastatin  (PRAVACHOL ) 40 MG tablet TAKE 1 TABLET BY MOUTH EVERY DAY 09/08/24  Yes Cannady, Jolene T, NP  traZODone  (DESYREL ) 50 MG tablet TAKE HALF TABLET AT NIGHT, MAY INCREASE TO ONE FULL TABLET IF NEEDED 03/23/24  Yes Wertman, Sara E, PA-C  verapamil  (CALAN -SR) 240 MG CR tablet TAKE 1 TABLET BY MOUTH EVERY DAY 09/17/24  Yes Cannady, Jolene T, NP  [Paused] colchicine  0.6 MG tablet TAKE 1 TABLET BY MOUTH EVERY DAY Wait to take this until your doctor or other care provider tells you to start again. 07/01/24   [name], NP    History reviewed. No pertinent family history.   Social History[1]  Allergies as of 09/26/2024   (No Known Allergies)    Review of Systems:    All systems reviewed and negative except where noted in HPI.   Physical Exam:  Vital signs in last 24 hours: Temp:  [98 F (36.7 C)-99.3 F (37.4 C)] 98.5 F (36.9 C) (01/17 0750) Pulse Rate:  [82-87] 85 (01/17 0750) Resp:  [16-31] 20 (01/17 0750) BP: (79-149)/(37-74) 129/64 (01/17 0750) SpO2:  [94 %-97 %] 97 % (01/17 0750) Last BM Date : 10/01/24 Appears comfortable not in any pain or distress Eyes:   No icterus.   Conjunctiva pink. PERRLA. Ears:  Normal auditory acuity. Neck:  Supple; no masses or thyroidomegaly Lungs: Respirations even and unlabored. Lungs clear to auscultation bilaterally.   No wheezes, crackles, or rhonchi.  Heart:  Regular rate and rhythm;  Without murmur, clicks, rubs or gallops Abdomen:  Soft, nondistended, nontender. Normal bowel sounds. No appreciable masses or hepatomegaly.  No rebound or guarding.  Neurologic:  Alert and oriented x1. Skin:  Intact without significant lesions or rashes. Cervical Nodes:  No significant cervical adenopathy. Psych: Awake mumbling incoherently at times  LAB RESULTS: Recent Labs    10/02/24 0152  WBC 7.4  HGB 7.5*  HCT 25.4*  PLT 286   BMET Recent  Labs    09/30/24 0500 10/01/24 0811 10/02/24 0152  NA 142 141 146*  K 4.0 4.0 4.0  CL 108 109 115*  CO2 25 24 12*  GLUCOSE 94 98 136*  BUN 42* 39* 41*  CREATININE 1.99* 1.83* 1.82*  CALCIUM 9.2 8.9 7.1*   LFT No results for input(s): PROT, ALBUMIN, AST, ALT, ALKPHOS, BILITOT, BILIDIR, IBILI in the last 72 hours. PT/INR No results for input(s): LABPROT, INR in the last 72 hours.  STUDIES: CT Angio Abd/Pel w/ and/or w/o Result Date: 10/02/2024 EXAM: CTA ABDOMEN AND PELVIS WITHOUT AND WITH CONTRAST 10/02/2024 07:03:39 AM TECHNIQUE: CTA images of the abdomen and pelvis without and with intravenous contrast. 100 mL iohexol  (OMNIPAQUE ) 350 MG/ML injection. Three-dimensional MIP/volume rendered formations were performed. Automated exposure control, iterative reconstruction, and/or weight based adjustment of the mA/kV was utilized to reduce the radiation dose to as low  as reasonably achievable. COMPARISON: PET/CT 11/21/2021. CLINICAL HISTORY: Lower GI bleed; New onset bloody bm. FINDINGS: VASCULATURE: Aortic atherosclerosis and coronary artery calcifications. AORTA: Aortic atherosclerotic calcification. No acute finding. No abdominal aortic aneurysm. No dissection. CELIAC TRUNK: No acute finding. No occlusion or significant stenosis. SUPERIOR MESENTERIC ARTERY: No acute finding. No occlusion or significant stenosis. RENAL ARTERIES: No acute finding. No occlusion or significant stenosis. ILIAC ARTERIES: No acute finding. No occlusion or significant stenosis. LIVER: Cyst within dome of the liver measures 4.5 cm. No suspicious liver lesions. GALLBLADDER AND BILE DUCTS: Status post cholecystectomy. No biliary ductal dilatation. SPLEEN: Calcified granulomas within the spleen. PANCREAS: No pancreatic main duct dilatation, inflammation or solid mass. Cyst arising from the distal tail of the pancreas measures 1.2 cm and 4 Hounsfield units without internal enhancement. New since previous PET  CT. ADRENAL GLANDS: Bilateral adrenal glands demonstrate no acute abnormality. No periadrenal stranding. KIDNEYS, URETERS AND BLADDER: Bilateral renal cortical and parenchymal volume loss. 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 The largest arises from the upper pole of the left kidney measuring 5.7 cm. Unless otherwise clinically indicated, no follow-up imaging is recommended for bosniak class 1 kidney cyst. No stones in the kidneys or ureters. No hydronephrosis. No perinephric or periureteral stranding. Urinary bladder is unremarkable. GI AND BOWEL: Esophagus. There is wall thickening with surrounding soft tissue stranding involving the duodenum. No focal fluid collections. No pathologic dilatation of the large or small bowel loops. There is sigmoid diverticulosis without signs of acute diverticulitis. The appendix is visualized and appears normal. On the arterial phase images there are no signs of active intraluminal contrast extravasation. On the portal venous phase images a small focus of hyperdensity within the lumen of the ascending colon just before the hepatic flexure, coronal image 52/15 and axial image 27/13. There is some motion artifact within this area, diminishing exam detail. REPRODUCTIVE: Prostate gland is normal. PERITONEUM AND RETROPERITONEUM: No ascites or free air. LUNG BASE: Pleural thickening and subsegmental atelectasis noted within the left base. LYMPH NODES: No lymphadenopathy. BONES AND SOFT TISSUES: No acute abnormality of the bones. No acute soft tissue abnormality. IMPRESSION: 1. There is no active arterial phase contrast extravasation into the bowel loops. On the portal venous phase images, there is a focal area of hyperdensity in the ascending colon near the hepatic flexure may represent intraluminal blood versus artifact from motion. If bleeding persists or recurs, consider colonoscopy and/or repeat CTA or tagged RBC scan for localization. 2. Mild duodenal wall thickening  with surrounding stranding without focal fluid collection, suggestive of duodenitis or peptic ulcer disease; consider upper endoscopic evaluation as clinically warranted. 3. Several unsuccessful attempts were made to contact the ordering provider . Results of this study were ultimately given to Nurse Laurel on 10/02/2024 at 7:57 am Electronically signed by: Waddell Calk MD 10/02/2024 07:57 AM EST RP Workstation: [id number]      Impression / Plan:   Jason Estrada is a 84 y.o. y/o male with with CKD, dementia admitted with AKI, rhinovirus infection.  I was contacted in the middle of the night for an episode of bloody bowel movements CT angiogram shows no active bleeding but there were concern for esophagitis, duodenitis and area of abnormality probably an artifact in the area of the ascending colon/hepatic flexure which could indicate old blood.  Hemoglobin drop of over 2 g from baseline.  The patient does have a sigmoid diverticulosis in the sigmoid colon.  Received 1 unit of blood transfusion overnight and had a  total of about 4 bloody bowel movements.  Impression: Very likely a diverticular bleed  Plan 1.  Monitor CBC transfuse as needed 2.  Ideally he would require a colonoscopy to confirm that it is a diverticular bleed , but it would be very hard because I do not believe he can get through a bowel prep based on my interaction with him due to the dementia and may be futile.  Better option would be to determine his goals of care and less invasive option would be to perform either a RBC scan or CT angiogram if he has an acute bleed with the aim of catching the area that is bleeding for further embolization if we are heading in that direction with regard to his care.   Thank you for involving me in the care of this patient.      LOS: 6 days   [name], MD  10/02/2024, 9:26 AM         [1]  Social History Tobacco Use   Smoking status: Never    Passive exposure: Never   Smokeless  tobacco: Never  Vaping Use   Vaping status: Never Used  Substance Use Topics   Alcohol  use: Yes    Alcohol /week: 7.0 - 14.0 standard drinks of alcohol     Types: 7 - 14 Shots of liquor per week    Comment: 1 mixed drinks nightly   Drug use: Not Currently   "

## 2024-10-17 NOTE — Progress Notes (Addendum)
 Rapid Response Event Note   Reason for Call :  Syncopal episode in the bathroom on the toilet  Initial Focused Assessment:   Patient was slumped over in a wheelchair after having a very large bloody bowel movent. Vitals stable initially then started to deteriorate. Last blood pressure was 78/48. ICU charge Geni and Natalie Donati are at the bedside.    Interventions:   Patient was given a normal saline bolus and labs was drawn.  2 peripheral IVs were placed. Plan of Care:   Hemoglobin is 7.5, orders were placed to transfuse. Cardiac monitoring ordered  Event Summary:  Patient is transferring to stepdown. Update 0520 transfer orders cancelled, patient will remain on current unit. MD Notified: Laneta Gardener Call Time: 0126 Arrival Time: End [id number]  Jason DELENA Merilee Shlomo, RN

## 2024-10-17 NOTE — Progress Notes (Addendum)
 "       Overnight   NAME: Jason Estrada MRN: [id number] DOB : 23-Sep-1940    Date of Service   10/02/2024   HPI/Events of Note   HPI:  Jason Estrada is a 84 year old gentleman with a past medical history of dementia, prostate cancer gastroesophageal reflux disease, hypertension, chronic kidney disease stage IIIb and recent hospitalization for sepsis secondary to olecranon bursitis discharged 08/26/2024 who presented to the emergency department with mental status change presenting as inability to be aroused and somnolence.  The patient's wife Jason Estrada at the bedside provides most of the history.  She reports the patient was recently discharged from inpatient rehab after a 30-day short-term rehabilitation stay.  The patient was noted to have over the last 4 to 5 days a cough but continue to eat and drink normally as per the spouse.  She had been bringing in protein drinks and on the day of discharge from the rehab yesterday the patient had an episode of sundowning and confusion requiring extra time to arouse him.  Of note the patient had issues with inability to function with rehabilitation well at the facility due to the fact he was given Seroquel  and was overly sedated.  When the patient's home health care nurse was unable to arouse him this morning the activated EMS.  Of note the patient was reportedly bradycardic in the 40s with systolic pressure in the 100s when EMS arrived with pinpoint pupils as well as desaturation and reportedly responded well to Narcan although the patient's wife reports no opiates in the house and the patient is dependent on her for medications.  The patient had also been receiving aggressive diuresis reportedly with Lasix  for dependent edema which patient's wife reports is significantly improved now.  Patient is confused at baseline although the patient's wife believes he is now at his mental baseline.  Patient's wife cannot recall any past echocardiograms or diagnosis of heart  failure   Overnight:  RR called when patient found in restroom with minimal responsiveness and rectal bleeding. On arrival to room pt is in restroom on toilet, diaphoretic, rectal bleeding present. Max assist  with wheelchair to bed. VS 94/51 map 66. Tachycardia 132. Ns bolus ordered. Cbc, bmp, stat Type and screen stat. Pt denies any pain at this time,   Physical Exam Constitutional:      Appearance: He is ill-appearing.  HENT:     Mouth/Throat:     Mouth: Mucous membranes are dry.  Eyes:     Pupils: Pupils are equal, round, and reactive to light.  Cardiovascular:     Rate and Rhythm: Regular rhythm. Tachycardia present.     Pulses:          Radial pulses are 2+ on the right side and 2+ on the left side.       Dorsalis pedis pulses are 2+ on the right side and 2+ on the left side.  Abdominal:     General: Abdomen is flat. Bowel sounds are decreased.     Tenderness: There is no abdominal tenderness.  Musculoskeletal:     Right lower leg: No edema.     Left lower leg: No edema.  Skin:    General: Skin is dry.     Capillary Refill: Capillary refill takes 2 to 3 seconds.     Coloration: Skin is pale.  Neurological:     Mental Status: He is alert and oriented to person, place, and time.     GCS: GCS eye  subscore is 4. GCS verbal subscore is 5. GCS motor subscore is 6.  Psychiatric:        Behavior: Behavior is cooperative.        Judgment: Judgment is impulsive.       Interventions/ Plan   Stat type and screen, bmp, cbc with hemoglobin drop from 10.7-->7.5 Stat blood transfusion 1 Unit PRBC for symptomatic anemia  1 L bolus of normal saline  Transfer to SD while awaiting blood transfusion given hypotension  Gi consult for new onset rectal bleeding. Unstable for scan at the moment.   Updates     Taden Witter Donati- Aram BSN RN CCRN AGACNP-BC Acute Care Nurse Practitioner [facility] [place]  "

## 2024-10-17 DEATH — deceased

## 2024-10-21 ENCOUNTER — Ambulatory Visit: Admitting: Physician Assistant

## 2024-11-17 ENCOUNTER — Ambulatory Visit: Admitting: Urology

## 2024-11-18 ENCOUNTER — Ambulatory Visit: Payer: Federal, State, Local not specified - PPO | Admitting: Urology

## 2024-12-22 ENCOUNTER — Ambulatory Visit: Admitting: Nurse Practitioner
